# Patient Record
Sex: Female | Born: 1945
Health system: Southern US, Community
[De-identification: ages and names within clinical notes are randomized; demographics above are authoritative.]

## PROBLEM LIST (undated history)

## (undated) DIAGNOSIS — K219 Gastro-esophageal reflux disease without esophagitis: Secondary | ICD-10-CM

## (undated) DIAGNOSIS — Z780 Asymptomatic menopausal state: Secondary | ICD-10-CM

## (undated) DIAGNOSIS — M1990 Unspecified osteoarthritis, unspecified site: Secondary | ICD-10-CM

## (undated) DIAGNOSIS — T8859XA Other complications of anesthesia, initial encounter: Secondary | ICD-10-CM

## (undated) DIAGNOSIS — T4145XA Adverse effect of unspecified anesthetic, initial encounter: Secondary | ICD-10-CM

## (undated) DIAGNOSIS — M545 Low back pain: Secondary | ICD-10-CM

## (undated) DIAGNOSIS — I890 Lymphedema, not elsewhere classified: Secondary | ICD-10-CM

## (undated) DIAGNOSIS — G2581 Restless legs syndrome: Secondary | ICD-10-CM

## (undated) DIAGNOSIS — I10 Essential (primary) hypertension: Secondary | ICD-10-CM

## (undated) HISTORY — PX: TONSILLECTOMY: SUR1361

## (undated) HISTORY — DX: Unspecified osteoarthritis, unspecified site: M19.90

## (undated) HISTORY — DX: Gastro-esophageal reflux disease without esophagitis: K21.9

## (undated) HISTORY — DX: Restless legs syndrome: G25.81

## (undated) HISTORY — PX: CATARACT EXTRACTION: SUR2

## (undated) HISTORY — DX: Asymptomatic menopausal state: Z78.0

## (undated) HISTORY — PX: FOOT SURGERY: SHX648

## (undated) HISTORY — DX: Lymphedema, not elsewhere classified: I89.0

## (undated) HISTORY — DX: Low back pain: M54.5

## (undated) HISTORY — PX: OTHER SURGICAL HISTORY: SHX169

---

## 1994-04-10 DIAGNOSIS — M545 Low back pain, unspecified: Secondary | ICD-10-CM

## 1994-04-10 HISTORY — DX: Low back pain, unspecified: M54.50

## 1997-06-10 DIAGNOSIS — F32A Depression, unspecified: Secondary | ICD-10-CM | POA: Diagnosis present

## 1997-06-10 DIAGNOSIS — G2581 Restless legs syndrome: Secondary | ICD-10-CM

## 1997-06-10 HISTORY — DX: Restless legs syndrome: G25.81

## 1998-06-19 ENCOUNTER — Other Ambulatory Visit: Admission: RE | Admit: 1998-06-19 | Discharge: 1998-06-19 | Payer: Self-pay | Admitting: *Deleted

## 1999-09-10 ENCOUNTER — Other Ambulatory Visit: Admission: RE | Admit: 1999-09-10 | Discharge: 1999-09-10 | Payer: Self-pay | Admitting: *Deleted

## 2000-01-09 DIAGNOSIS — K219 Gastro-esophageal reflux disease without esophagitis: Secondary | ICD-10-CM

## 2000-01-09 HISTORY — DX: Gastro-esophageal reflux disease without esophagitis: K21.9

## 2000-10-15 ENCOUNTER — Other Ambulatory Visit: Admission: RE | Admit: 2000-10-15 | Discharge: 2000-10-15 | Payer: Self-pay | Admitting: *Deleted

## 2001-11-06 ENCOUNTER — Other Ambulatory Visit: Admission: RE | Admit: 2001-11-06 | Discharge: 2001-11-06 | Payer: Self-pay | Admitting: *Deleted

## 2003-02-17 ENCOUNTER — Other Ambulatory Visit: Admission: RE | Admit: 2003-02-17 | Discharge: 2003-02-17 | Payer: Self-pay | Admitting: *Deleted

## 2004-03-21 ENCOUNTER — Other Ambulatory Visit: Payer: Self-pay

## 2004-03-21 ENCOUNTER — Inpatient Hospital Stay: Payer: Self-pay

## 2004-03-26 ENCOUNTER — Ambulatory Visit: Payer: Self-pay

## 2004-04-19 ENCOUNTER — Other Ambulatory Visit: Admission: RE | Admit: 2004-04-19 | Discharge: 2004-04-19 | Payer: Self-pay | Admitting: *Deleted

## 2004-05-21 ENCOUNTER — Ambulatory Visit: Payer: Self-pay | Admitting: Unknown Physician Specialty

## 2004-05-23 ENCOUNTER — Ambulatory Visit: Payer: Self-pay

## 2004-06-13 ENCOUNTER — Encounter: Payer: Self-pay | Admitting: General Practice

## 2004-07-11 ENCOUNTER — Encounter: Payer: Self-pay | Admitting: General Practice

## 2005-01-28 ENCOUNTER — Ambulatory Visit: Payer: Self-pay | Admitting: Internal Medicine

## 2005-02-08 ENCOUNTER — Other Ambulatory Visit: Admission: RE | Admit: 2005-02-08 | Discharge: 2005-02-08 | Payer: Self-pay | Admitting: *Deleted

## 2005-05-16 ENCOUNTER — Ambulatory Visit: Payer: Self-pay | Admitting: Internal Medicine

## 2005-05-22 ENCOUNTER — Ambulatory Visit: Payer: Self-pay | Admitting: Internal Medicine

## 2005-09-02 ENCOUNTER — Ambulatory Visit: Payer: Self-pay | Admitting: Unknown Physician Specialty

## 2006-05-12 ENCOUNTER — Ambulatory Visit: Payer: Self-pay

## 2006-08-01 ENCOUNTER — Other Ambulatory Visit: Admission: RE | Admit: 2006-08-01 | Discharge: 2006-08-01 | Payer: Self-pay | Admitting: Obstetrics & Gynecology

## 2007-03-24 ENCOUNTER — Ambulatory Visit: Payer: Self-pay | Admitting: Internal Medicine

## 2007-05-12 ENCOUNTER — Ambulatory Visit: Payer: Self-pay

## 2007-11-25 ENCOUNTER — Ambulatory Visit: Payer: Self-pay | Admitting: Unknown Physician Specialty

## 2008-05-12 ENCOUNTER — Other Ambulatory Visit: Admission: RE | Admit: 2008-05-12 | Discharge: 2008-05-12 | Payer: Self-pay | Admitting: Obstetrics & Gynecology

## 2008-05-12 ENCOUNTER — Ambulatory Visit: Payer: Self-pay | Admitting: Obstetrics and Gynecology

## 2008-12-05 ENCOUNTER — Other Ambulatory Visit: Payer: Self-pay | Admitting: Obstetrics and Gynecology

## 2009-05-15 ENCOUNTER — Ambulatory Visit: Payer: Self-pay

## 2009-12-07 ENCOUNTER — Ambulatory Visit: Payer: Self-pay | Admitting: Internal Medicine

## 2010-01-10 ENCOUNTER — Ambulatory Visit: Payer: Self-pay | Admitting: Internal Medicine

## 2010-07-05 ENCOUNTER — Other Ambulatory Visit: Payer: Self-pay | Admitting: Internal Medicine

## 2010-07-11 ENCOUNTER — Ambulatory Visit: Payer: Self-pay

## 2010-12-14 ENCOUNTER — Ambulatory Visit: Payer: Self-pay | Admitting: Internal Medicine

## 2011-01-19 ENCOUNTER — Other Ambulatory Visit: Payer: Self-pay | Admitting: Internal Medicine

## 2011-01-29 ENCOUNTER — Ambulatory Visit: Payer: Self-pay | Admitting: Pain Medicine

## 2011-02-18 ENCOUNTER — Ambulatory Visit: Payer: Self-pay | Admitting: Pain Medicine

## 2011-02-21 ENCOUNTER — Ambulatory Visit: Payer: Self-pay | Admitting: Pain Medicine

## 2011-03-07 ENCOUNTER — Ambulatory Visit: Payer: Self-pay | Admitting: Pain Medicine

## 2011-03-25 ENCOUNTER — Ambulatory Visit: Payer: Self-pay | Admitting: Pain Medicine

## 2011-03-28 ENCOUNTER — Ambulatory Visit: Payer: Self-pay | Admitting: Pain Medicine

## 2011-04-22 ENCOUNTER — Ambulatory Visit: Payer: Self-pay | Admitting: General Practice

## 2011-05-08 ENCOUNTER — Inpatient Hospital Stay: Payer: Self-pay | Admitting: General Practice

## 2011-05-08 HISTORY — PX: TOTAL HIP ARTHROPLASTY: SHX124

## 2011-05-09 LAB — PATHOLOGY REPORT

## 2011-08-06 ENCOUNTER — Ambulatory Visit: Payer: Self-pay | Admitting: Obstetrics and Gynecology

## 2011-08-13 ENCOUNTER — Other Ambulatory Visit: Payer: Self-pay | Admitting: Internal Medicine

## 2011-08-13 ENCOUNTER — Other Ambulatory Visit: Payer: Self-pay | Admitting: Dermatology

## 2011-08-13 LAB — HEPATIC FUNCTION PANEL A (ARMC)
Albumin: 4.5 g/dL (ref 3.4–5.0)
Alkaline Phosphatase: 65 U/L (ref 50–136)
Bilirubin, Direct: <0.1 mg/dL (ref 0.00–0.20)
Bilirubin,Total: 0.5 mg/dL (ref 0.2–1.0)
SGOT(AST): 28 U/L (ref 15–37)
SGPT (ALT): 27 U/L
Total Protein: 8.3 g/dL — ABNORMAL HIGH (ref 6.4–8.2)

## 2011-08-13 LAB — CBC WITH DIFFERENTIAL/PLATELET
Basophil #: 0 10*3/uL (ref 0.0–0.1)
Basophil %: 0.3 %
Eosinophil #: 0.2 10*3/uL (ref 0.0–0.7)
Eosinophil %: 2 %
HCT: 42.9 % (ref 35.0–47.0)
HGB: 14.5 g/dL (ref 12.0–16.0)
Lymphocyte #: 2.3 10*3/uL (ref 1.0–3.6)
Lymphocyte %: 29.2 %
MCH: 31.6 pg (ref 26.0–34.0)
MCHC: 33.8 g/dL (ref 32.0–36.0)
MCV: 93 fL (ref 80–100)
Monocyte #: 0.5 10*3/uL (ref 0.0–0.7)
Monocyte %: 5.7 %
Neutrophil #: 5 10*3/uL (ref 1.4–6.5)
Neutrophil %: 62.8 %
Platelet: 272 10*3/uL (ref 150–440)
RBC: 4.6 10*6/uL (ref 3.80–5.20)
RDW: 12.8 % (ref 11.5–14.5)
WBC: 7.9 10*3/uL (ref 3.6–11.0)

## 2011-08-13 LAB — LIPID PANEL
Cholesterol: 255 mg/dL — ABNORMAL HIGH (ref 0–200)
HDL Cholesterol: 39 mg/dL — ABNORMAL LOW (ref 40–60)
Ldl Cholesterol, Calc: 149 mg/dL — ABNORMAL HIGH (ref 0–100)
Triglycerides: 333 mg/dL — ABNORMAL HIGH (ref 0–200)
VLDL Cholesterol, Calc: 67 mg/dL — ABNORMAL HIGH (ref 5–40)

## 2011-08-13 LAB — TSH: Thyroid Stimulating Horm: 0.911 u[IU]/mL

## 2011-10-15 ENCOUNTER — Encounter: Payer: Self-pay | Admitting: General Practice

## 2011-11-05 ENCOUNTER — Other Ambulatory Visit: Payer: Self-pay | Admitting: Internal Medicine

## 2011-11-05 LAB — URINALYSIS, COMPLETE
Bacteria: NONE SEEN
Bilirubin,UR: NEGATIVE
Blood: NEGATIVE
Glucose,UR: NEGATIVE mg/dL (ref 0–75)
Ketone: NEGATIVE
Leukocyte Esterase: NEGATIVE
Nitrite: NEGATIVE
Ph: 6 (ref 4.5–8.0)
Protein: NEGATIVE
RBC,UR: 2 /HPF (ref 0–5)
Specific Gravity: 1.015 (ref 1.003–1.030)
Squamous Epithelial: <1
WBC UR: <1 /HPF (ref 0–5)

## 2011-11-05 LAB — COMPREHENSIVE METABOLIC PANEL
Albumin: 4.2 g/dL (ref 3.4–5.0)
Alkaline Phosphatase: 71 U/L (ref 50–136)
Anion Gap: 9 (ref 7–16)
BUN: 13 mg/dL (ref 7–18)
Bilirubin,Total: 0.5 mg/dL (ref 0.2–1.0)
Calcium, Total: 9.1 mg/dL (ref 8.5–10.1)
Chloride: 105 mmol/L (ref 98–107)
Co2: 27 mmol/L (ref 21–32)
Creatinine: 0.63 mg/dL (ref 0.60–1.30)
EGFR (African American): >60
EGFR (Non-African Amer.): >60
Glucose: 95 mg/dL (ref 65–99)
Osmolality: 281 (ref 275–301)
Potassium: 4 mmol/L (ref 3.5–5.1)
SGOT(AST): 20 U/L (ref 15–37)
SGPT (ALT): 23 U/L
Sodium: 141 mmol/L (ref 136–145)
Total Protein: 7.6 g/dL (ref 6.4–8.2)

## 2011-11-05 LAB — CBC WITH DIFFERENTIAL/PLATELET
Basophil #: 0.1 10*3/uL (ref 0.0–0.1)
Basophil %: 0.9 %
Eosinophil #: 0.1 10*3/uL (ref 0.0–0.7)
Eosinophil %: 2.4 %
HCT: 39.6 % (ref 35.0–47.0)
HGB: 13.5 g/dL (ref 12.0–16.0)
Lymphocyte #: 1.7 10*3/uL (ref 1.0–3.6)
Lymphocyte %: 29 %
MCH: 31.6 pg (ref 26.0–34.0)
MCHC: 34.1 g/dL (ref 32.0–36.0)
MCV: 93 fL (ref 80–100)
Monocyte #: 0.4 x10 3/mm (ref 0.2–0.9)
Monocyte %: 7.2 %
Neutrophil #: 3.5 10*3/uL (ref 1.4–6.5)
Neutrophil %: 60.5 %
Platelet: 213 10*3/uL (ref 150–440)
RBC: 4.28 10*6/uL (ref 3.80–5.20)
RDW: 12.5 % (ref 11.5–14.5)
WBC: 5.8 10*3/uL (ref 3.6–11.0)

## 2011-11-05 LAB — LIPID PANEL
Cholesterol: 241 mg/dL — ABNORMAL HIGH (ref 0–200)
HDL Cholesterol: 43 mg/dL (ref 40–60)
Ldl Cholesterol, Calc: 157 mg/dL — ABNORMAL HIGH (ref 0–100)
Triglycerides: 205 mg/dL — ABNORMAL HIGH (ref 0–200)
VLDL Cholesterol, Calc: 41 mg/dL — ABNORMAL HIGH (ref 5–40)

## 2011-11-11 LAB — HM PAP SMEAR: HM Pap smear: NEGATIVE

## 2011-11-13 ENCOUNTER — Ambulatory Visit: Payer: Self-pay | Admitting: Internal Medicine

## 2011-12-06 ENCOUNTER — Ambulatory Visit: Payer: Self-pay | Admitting: Unknown Physician Specialty

## 2011-12-06 HISTORY — PX: COLONOSCOPY: SHX174

## 2011-12-10 LAB — PATHOLOGY REPORT

## 2012-05-02 ENCOUNTER — Other Ambulatory Visit: Payer: Self-pay | Admitting: Internal Medicine

## 2012-05-02 LAB — BASIC METABOLIC PANEL
Anion Gap: 6 — ABNORMAL LOW (ref 7–16)
BUN: 14 mg/dL (ref 7–18)
Calcium, Total: 9.1 mg/dL (ref 8.5–10.1)
Chloride: 108 mmol/L — ABNORMAL HIGH (ref 98–107)
Co2: 26 mmol/L (ref 21–32)
Creatinine: 0.68 mg/dL (ref 0.60–1.30)
EGFR (African American): >60
EGFR (Non-African Amer.): >60
Glucose: 104 mg/dL — ABNORMAL HIGH (ref 65–99)
Osmolality: 280 (ref 275–301)
Potassium: 3.8 mmol/L (ref 3.5–5.1)
Sodium: 140 mmol/L (ref 136–145)

## 2012-05-02 LAB — LIPID PANEL
Cholesterol: 210 mg/dL — ABNORMAL HIGH (ref 0–200)
HDL Cholesterol: 37 mg/dL — ABNORMAL LOW (ref 40–60)
Ldl Cholesterol, Calc: 119 mg/dL — ABNORMAL HIGH (ref 0–100)
Triglycerides: 269 mg/dL — ABNORMAL HIGH (ref 0–200)
VLDL Cholesterol, Calc: 54 mg/dL — ABNORMAL HIGH (ref 5–40)

## 2012-11-04 ENCOUNTER — Other Ambulatory Visit: Payer: Self-pay | Admitting: Internal Medicine

## 2012-11-04 LAB — LIPID PANEL
Cholesterol: 224 mg/dL — ABNORMAL HIGH (ref 0–200)
HDL Cholesterol: 44 mg/dL (ref 40–60)
Ldl Cholesterol, Calc: 147 mg/dL — ABNORMAL HIGH (ref 0–100)
Triglycerides: 163 mg/dL (ref 0–200)
VLDL Cholesterol, Calc: 33 mg/dL (ref 5–40)

## 2012-11-04 LAB — CBC WITH DIFFERENTIAL/PLATELET
Basophil #: 0 10*3/uL (ref 0.0–0.1)
Basophil %: 0.5 %
Eosinophil #: 0.2 10*3/uL (ref 0.0–0.7)
Eosinophil %: 2.2 %
HCT: 39.5 % (ref 35.0–47.0)
HGB: 14 g/dL (ref 12.0–16.0)
Lymphocyte #: 1.7 10*3/uL (ref 1.0–3.6)
Lymphocyte %: 19.8 %
MCH: 32.2 pg (ref 26.0–34.0)
MCHC: 35.4 g/dL (ref 32.0–36.0)
MCV: 91 fL (ref 80–100)
Monocyte #: 0.4 x10 3/mm (ref 0.2–0.9)
Monocyte %: 4.3 %
Neutrophil #: 6.2 10*3/uL (ref 1.4–6.5)
Neutrophil %: 73.2 %
Platelet: 230 10*3/uL (ref 150–440)
RBC: 4.33 10*6/uL (ref 3.80–5.20)
RDW: 12.8 % (ref 11.5–14.5)
WBC: 8.5 10*3/uL (ref 3.6–11.0)

## 2012-11-04 LAB — URINALYSIS, COMPLETE
Bacteria: NONE SEEN
Bilirubin,UR: NEGATIVE
Blood: NEGATIVE
Glucose,UR: NEGATIVE mg/dL (ref 0–75)
Ketone: NEGATIVE
Leukocyte Esterase: NEGATIVE
Nitrite: NEGATIVE
Ph: 6 (ref 4.5–8.0)
Protein: NEGATIVE
RBC,UR: 2 /HPF (ref 0–5)
Specific Gravity: 1.018 (ref 1.003–1.030)
Squamous Epithelial: <1
WBC UR: 1 /HPF (ref 0–5)

## 2012-11-04 LAB — COMPREHENSIVE METABOLIC PANEL
Albumin: 4.3 g/dL (ref 3.4–5.0)
Alkaline Phosphatase: 86 U/L (ref 50–136)
Anion Gap: 3 — ABNORMAL LOW (ref 7–16)
BUN: 13 mg/dL (ref 7–18)
Bilirubin,Total: 0.6 mg/dL (ref 0.2–1.0)
Calcium, Total: 9.6 mg/dL (ref 8.5–10.1)
Chloride: 107 mmol/L (ref 98–107)
Co2: 29 mmol/L (ref 21–32)
Creatinine: 0.79 mg/dL (ref 0.60–1.30)
EGFR (African American): >60
EGFR (Non-African Amer.): >60
Glucose: 110 mg/dL — ABNORMAL HIGH (ref 65–99)
Osmolality: 278 (ref 275–301)
Potassium: 3.6 mmol/L (ref 3.5–5.1)
SGOT(AST): 22 U/L (ref 15–37)
SGPT (ALT): 29 U/L (ref 12–78)
Sodium: 139 mmol/L (ref 136–145)
Total Protein: 7.9 g/dL (ref 6.4–8.2)

## 2012-11-04 LAB — TSH: Thyroid Stimulating Horm: 1.46 u[IU]/mL

## 2012-11-09 ENCOUNTER — Encounter: Payer: Self-pay | Admitting: *Deleted

## 2012-11-11 ENCOUNTER — Ambulatory Visit: Payer: Self-pay | Admitting: General Practice

## 2012-11-12 ENCOUNTER — Ambulatory Visit (INDEPENDENT_AMBULATORY_CARE_PROVIDER_SITE_OTHER): Payer: 59 | Admitting: Nurse Practitioner

## 2012-11-12 ENCOUNTER — Encounter: Payer: Self-pay | Admitting: Nurse Practitioner

## 2012-11-12 VITALS — BP 116/66 | HR 74

## 2012-11-12 DIAGNOSIS — Z01419 Encounter for gynecological examination (general) (routine) without abnormal findings: Secondary | ICD-10-CM

## 2012-11-12 DIAGNOSIS — E559 Vitamin D deficiency, unspecified: Secondary | ICD-10-CM

## 2012-11-12 MED ORDER — VITAMIN D (ERGOCALCIFEROL) 1.25 MG (50000 UNIT) PO CAPS: 50000.0000 [IU] | ORAL_CAPSULE | ORAL | Status: DC

## 2012-11-12 NOTE — Progress Notes (Signed)
67 y.o. G2P2 Married Caucasian Fe here for annual exam. Newest health concern is with right hip pain and very concerned about needing now a right hip replacement. She is doing OK with vaso symptoms and vaginal dryness. Still has some concerns about hair thinning but all recent lads were normal including TSH.  She has been off  Menostar since fall 2012 and no history of VB since 9/06.    Mother age 40 still lives with them had small bowel resection in January.  She now is back home after recovery at Rehab. Then April 15 th fell and fracture of right elbow and nose. Treated with casting of the elbow  and will have therapy.also.   Patient's last menstrual period was 02/08/2005.          Sexually active: yes  The current method of family planning is none.    Exercising: yes  water aerobics 3x weekly Smoker:  no  Health Maintenance: Pap:  11/11/2011  Normal with negative HR HPV MMG:  11/11/2012 results still pending Colonoscopy:  12/06/2011 normal. Endo negative. BMD:   2012 per patient normal - followed by PCP TDaP:  2006 Labs: PCP does lab (blood) work.    reports that she has never smoked. She has never used smokeless tobacco. She reports that she does not drink alcohol or use illicit drugs.  Past Medical History  Diagnosis Date  . Menopause   . Cortex laceratn, opn intracran wnd, prlng loss conscious no return   . Hiatal hernia   . Low back pain   . Fibromyalgia   . Insomnia     chronic   . Restless leg syndrome   . Fatty tumor   . GERD (gastroesophageal reflux disease)   . Sleep apnea     Past Surgical History  Procedure Laterality Date  . Phlebitis groin    . Cesarean section      X2  . Total hip arthroplasty    . Total hip arthroplasty Left     Current Outpatient Prescriptions  Medication Sig Dispense Refill  . acetaminophen (TYLENOL) 500 MG tablet Take 500 mg by mouth every 6 (six) hours as needed for pain.      . Ascorbic Acid (VITAMIN C) 1000 MG tablet Take 1,000 mg by  mouth daily.      Marland Kitchen aspirin 81 MG tablet Take 81 mg by mouth daily.      Marland Kitchen atenolol (TENORMIN) 50 MG tablet Take 50 mg by mouth daily.      . calcium carbonate (OS-CAL - DOSED IN MG OF ELEMENTAL CALCIUM) 1250 MG tablet Take 1 tablet by mouth daily.      . cholecalciferol (VITAMIN D) 1000 UNITS tablet Take 1,000 Units by mouth daily.      . Cinnamon 500 MG capsule Take 500 mg by mouth daily.      . lansoprazole (PREVACID) 30 MG capsule Take 30 mg by mouth daily.      . Multiple Vitamin (MULTIVITAMIN) tablet Take 1 tablet by mouth daily.      . pramipexole (MIRAPEX) 0.5 MG tablet Take 0.5 mg by mouth 3 (three) times daily.       No current facility-administered medications for this visit.    History reviewed. No pertinent family history.  ROS:  Pertinent items are noted in HPI.  Otherwise, a comprehensive ROS was negative.  Exam:   BP 116/66  Pulse 74  LMP 02/08/2005    Ht Readings from Last 3 Encounters:  No  data found for Ht    General appearance: alert, cooperative and appears stated age Head: Normocephalic, without obvious abnormality, atraumatic Neck: no adenopathy, supple, symmetrical, trachea midline and thyroid normal to inspection and palpation Lungs: clear to auscultation bilaterally Breasts: normal appearance, no masses or tenderness Heart: regular rate and rhythm Abdomen: soft, non-tender; no masses,  no organomegaly Extremities: extremities normal, atraumatic, no cyanosis or edema Skin: Skin color, texture, turgor normal. No rashes or lesions Lymph nodes: Cervical, supraclavicular, and axillary nodes normal. No abnormal inguinal nodes palpated Neurologic: Grossly normal   Pelvic: External genitalia:  no lesions              Urethra:  normal appearing urethra with no masses, tenderness or lesions              Bartholin's and Skene's: normal                 Vagina: normal appearing vagina with normal color and discharge, no lesions              Cervix: anteverted               Pap taken: no Bimanual Exam:  Uterus:  normal size, contour, position, consistency, mobility, non-tender              Adnexa: no mass, fullness, tenderness               Rectovaginal: Confirms               Anus:  normal sphincter tone, no lesions  A:  Well Woman with normal exam  Postmenopausal off HRT fall 2012  Osteoarthritis with left hip replacement 04/2011, now pain on right.  Vit D deficiency  P:   Pap smear as per guidelines   Refill Vit D and follow lab results  Mammogram due again next June providing this one is normal return annually or prn Labs scanned from PCP dated 11/04/12  An After Visit Summary was printed and given to the patient.

## 2012-11-12 NOTE — Progress Notes (Signed)
Reviewed personally.  MSM 

## 2012-11-12 NOTE — Patient Instructions (Signed)

## 2012-11-13 ENCOUNTER — Telehealth: Payer: Self-pay | Admitting: *Deleted

## 2012-11-13 LAB — VITAMIN D 25 HYDROXY (VIT D DEFICIENCY, FRACTURES): Vit D, 25-Hydroxy: 70 ng/mL (ref 30–89)

## 2012-11-13 NOTE — Telephone Encounter (Signed)
LVM for pt to return my call in regards to lab results.  

## 2012-11-13 NOTE — Telephone Encounter (Signed)
Message copied by Osie Bond on Fri Nov 13, 2012  9:39 AM ------      Message from: Ria Comment R      Created: Fri Nov 13, 2012  9:31 AM       Let patient know about vit D and discontinue at this time ------

## 2012-11-16 ENCOUNTER — Telehealth: Payer: Self-pay | Admitting: *Deleted

## 2012-11-16 NOTE — Telephone Encounter (Signed)
Message copied by Osie Bond on Mon Nov 16, 2012  9:55 AM ------      Message from: Roanna Banning      Created: Fri Nov 13, 2012  9:31 AM       Let patient know about vit D and discontinue at this time ------

## 2012-11-16 NOTE — Telephone Encounter (Signed)
Pt is aware of vitamin d results and is agreeable to discontinue vitamin d (otc) intake.

## 2012-11-24 ENCOUNTER — Ambulatory Visit: Payer: Self-pay | Admitting: Internal Medicine

## 2013-11-15 ENCOUNTER — Ambulatory Visit: Payer: 59 | Admitting: Nurse Practitioner

## 2013-11-18 ENCOUNTER — Ambulatory Visit: Payer: Self-pay | Admitting: Obstetrics and Gynecology

## 2013-11-19 ENCOUNTER — Ambulatory Visit: Payer: Self-pay | Admitting: Obstetrics and Gynecology

## 2013-12-21 ENCOUNTER — Ambulatory Visit: Payer: 59 | Admitting: Nurse Practitioner

## 2013-12-28 DIAGNOSIS — I10 Essential (primary) hypertension: Secondary | ICD-10-CM | POA: Insufficient documentation

## 2013-12-28 DIAGNOSIS — G2581 Restless legs syndrome: Secondary | ICD-10-CM | POA: Insufficient documentation

## 2013-12-28 DIAGNOSIS — K219 Gastro-esophageal reflux disease without esophagitis: Secondary | ICD-10-CM | POA: Insufficient documentation

## 2013-12-28 DIAGNOSIS — E785 Hyperlipidemia, unspecified: Secondary | ICD-10-CM | POA: Insufficient documentation

## 2014-04-11 ENCOUNTER — Encounter: Payer: Self-pay | Admitting: Nurse Practitioner

## 2014-05-17 ENCOUNTER — Ambulatory Visit: Payer: Self-pay | Admitting: Nurse Practitioner

## 2014-09-15 ENCOUNTER — Ambulatory Visit: Payer: 59 | Admitting: Nurse Practitioner

## 2014-11-02 ENCOUNTER — Other Ambulatory Visit: Payer: Self-pay

## 2014-11-03 ENCOUNTER — Encounter: Payer: Self-pay | Admitting: *Deleted

## 2014-11-03 DIAGNOSIS — Z79899 Other long term (current) drug therapy: Secondary | ICD-10-CM | POA: Diagnosis not present

## 2014-11-03 DIAGNOSIS — M23241 Derangement of anterior horn of lateral meniscus due to old tear or injury, right knee: Secondary | ICD-10-CM | POA: Diagnosis not present

## 2014-11-03 DIAGNOSIS — M94261 Chondromalacia, right knee: Secondary | ICD-10-CM | POA: Diagnosis not present

## 2014-11-03 DIAGNOSIS — Z888 Allergy status to other drugs, medicaments and biological substances status: Secondary | ICD-10-CM | POA: Diagnosis not present

## 2014-11-03 DIAGNOSIS — Z880 Allergy status to penicillin: Secondary | ICD-10-CM | POA: Diagnosis not present

## 2014-11-03 DIAGNOSIS — G4730 Sleep apnea, unspecified: Secondary | ICD-10-CM | POA: Diagnosis not present

## 2014-11-03 DIAGNOSIS — M23221 Derangement of posterior horn of medial meniscus due to old tear or injury, right knee: Secondary | ICD-10-CM | POA: Diagnosis not present

## 2014-11-03 DIAGNOSIS — I10 Essential (primary) hypertension: Secondary | ICD-10-CM | POA: Diagnosis not present

## 2014-11-03 DIAGNOSIS — M2391 Unspecified internal derangement of right knee: Secondary | ICD-10-CM | POA: Diagnosis present

## 2014-11-03 DIAGNOSIS — K219 Gastro-esophageal reflux disease without esophagitis: Secondary | ICD-10-CM | POA: Diagnosis not present

## 2014-11-03 DIAGNOSIS — Z79891 Long term (current) use of opiate analgesic: Secondary | ICD-10-CM | POA: Diagnosis not present

## 2014-11-03 DIAGNOSIS — E785 Hyperlipidemia, unspecified: Secondary | ICD-10-CM | POA: Diagnosis not present

## 2014-11-03 DIAGNOSIS — Z9889 Other specified postprocedural states: Secondary | ICD-10-CM | POA: Diagnosis not present

## 2014-11-03 DIAGNOSIS — Z881 Allergy status to other antibiotic agents status: Secondary | ICD-10-CM | POA: Diagnosis not present

## 2014-11-03 DIAGNOSIS — M23251 Derangement of posterior horn of lateral meniscus due to old tear or injury, right knee: Secondary | ICD-10-CM | POA: Diagnosis not present

## 2014-11-03 NOTE — Patient Instructions (Signed)
  Your procedure is scheduled on:11/14/14 Report to Day Surgery. To find out your arrival time please call [phone] between 1PM - 3PM on 11/18/14.  Remember: Instructions that are not followed completely may result in serious medical risk, up to and including death, or upon the discretion of your surgeon and anesthesiologist your surgery may need to be rescheduled.    _x___ 1. Do not eat food or drink liquids after midnight. No gum chewing or hard candies.     __x__ 2. No Alcohol for 24 hours before or after surgery.   ____ 3. Bring all medications with you on the day of surgery if instructed.    __x__ 4. Notify your doctor if there is any change in your medical condition     (cold, fever, infections).     Do not wear jewelry, make-up, hairpins, clips or nail polish.  Do not wear lotions, powders, or perfumes. You may wear deodorant.  Do not shave 48 hours prior to surgery. Men may shave face and neck.  Do not bring valuables to the hospital.    [facility] is not responsible for any belongings or valuables.               Contacts, dentures or bridgework may not be worn into surgery.  Leave your suitcase in the car. After surgery it may be brought to your room.  For patients admitted to the hospital, discharge time is determined by your                treatment team.   Patients discharged the day of surgery will not be allowed to drive home.   Please read over the following fact sheets that you were given:   Surgical Site Infection Prevention   ____ Take these medicines the morning of surgery with A SIP OF WATER:    1. atenolol  2. prilosec  3.   4.  5.  6.  ____ Fleet Enema (as directed)   ___x_ Use CHG Soap as directed  ____ Use inhalers on the day of surgery  ____ Stop metformin 2 days prior to surgery    ____ Take 1/2 of usual insulin dose the night before surgery and none on the morning of surgery.   ____ Stop Coumadin/Plavix/aspirin on ____ Stop  Anti-inflammatories on    _x___ Stop supplements until after surgery.   red yeast rice,hair skin nails  ____ Bring C-Pap to the hospital.

## 2014-11-03 NOTE — OR Nursing (Signed)
Patient called with medication completion. Instructed to stop melatonin now

## 2014-11-08 ENCOUNTER — Ambulatory Visit: Payer: 59 | Admitting: Nurse Practitioner

## 2014-11-09 ENCOUNTER — Encounter
Admission: RE | Admit: 2014-11-09 | Discharge: 2014-11-09 | Disposition: A | Discharge status: Home / Self Care | Payer: Medicare Other | Source: Ambulatory Visit | Attending: Anesthesiology | Admitting: Anesthesiology

## 2014-11-09 DIAGNOSIS — E785 Hyperlipidemia, unspecified: Secondary | ICD-10-CM | POA: Diagnosis not present

## 2014-11-09 DIAGNOSIS — Z01810 Encounter for preprocedural cardiovascular examination: Secondary | ICD-10-CM | POA: Insufficient documentation

## 2014-11-09 DIAGNOSIS — Z79899 Other long term (current) drug therapy: Secondary | ICD-10-CM | POA: Diagnosis not present

## 2014-11-09 DIAGNOSIS — I10 Essential (primary) hypertension: Secondary | ICD-10-CM | POA: Insufficient documentation

## 2014-11-09 DIAGNOSIS — M2391 Unspecified internal derangement of right knee: Secondary | ICD-10-CM | POA: Insufficient documentation

## 2014-11-09 DIAGNOSIS — K219 Gastro-esophageal reflux disease without esophagitis: Secondary | ICD-10-CM | POA: Diagnosis not present

## 2014-11-09 DIAGNOSIS — N281 Cyst of kidney, acquired: Secondary | ICD-10-CM | POA: Diagnosis not present

## 2014-11-09 DIAGNOSIS — G4730 Sleep apnea, unspecified: Secondary | ICD-10-CM | POA: Insufficient documentation

## 2014-11-10 NOTE — OR Nursing (Signed)
Ekg to anesthesia for review

## 2014-11-14 ENCOUNTER — Ambulatory Visit: Payer: Medicare Other | Admitting: Anesthesiology

## 2014-11-14 ENCOUNTER — Encounter: Payer: Self-pay | Admitting: *Deleted

## 2014-11-14 ENCOUNTER — Ambulatory Visit
Admission: RE | Admit: 2014-11-14 | Discharge: 2014-11-14 | Disposition: A | Discharge status: Home / Self Care | Payer: Medicare Other | Source: Ambulatory Visit | Attending: Orthopedic Surgery | Admitting: Orthopedic Surgery

## 2014-11-14 ENCOUNTER — Encounter
Admission: RE | Disposition: A | Discharge status: Home / Self Care | Payer: Self-pay | Source: Ambulatory Visit | Attending: Orthopedic Surgery

## 2014-11-14 DIAGNOSIS — Z79899 Other long term (current) drug therapy: Secondary | ICD-10-CM | POA: Insufficient documentation

## 2014-11-14 DIAGNOSIS — M23251 Derangement of posterior horn of lateral meniscus due to old tear or injury, right knee: Secondary | ICD-10-CM | POA: Insufficient documentation

## 2014-11-14 DIAGNOSIS — M23241 Derangement of anterior horn of lateral meniscus due to old tear or injury, right knee: Secondary | ICD-10-CM | POA: Insufficient documentation

## 2014-11-14 DIAGNOSIS — M94261 Chondromalacia, right knee: Secondary | ICD-10-CM | POA: Insufficient documentation

## 2014-11-14 DIAGNOSIS — M23221 Derangement of posterior horn of medial meniscus due to old tear or injury, right knee: Secondary | ICD-10-CM | POA: Diagnosis not present

## 2014-11-14 DIAGNOSIS — Z9889 Other specified postprocedural states: Secondary | ICD-10-CM | POA: Insufficient documentation

## 2014-11-14 DIAGNOSIS — I10 Essential (primary) hypertension: Secondary | ICD-10-CM | POA: Insufficient documentation

## 2014-11-14 DIAGNOSIS — G4730 Sleep apnea, unspecified: Secondary | ICD-10-CM | POA: Insufficient documentation

## 2014-11-14 DIAGNOSIS — K219 Gastro-esophageal reflux disease without esophagitis: Secondary | ICD-10-CM | POA: Insufficient documentation

## 2014-11-14 DIAGNOSIS — Z881 Allergy status to other antibiotic agents status: Secondary | ICD-10-CM | POA: Insufficient documentation

## 2014-11-14 DIAGNOSIS — Z880 Allergy status to penicillin: Secondary | ICD-10-CM | POA: Insufficient documentation

## 2014-11-14 DIAGNOSIS — E785 Hyperlipidemia, unspecified: Secondary | ICD-10-CM | POA: Insufficient documentation

## 2014-11-14 DIAGNOSIS — Z888 Allergy status to other drugs, medicaments and biological substances status: Secondary | ICD-10-CM | POA: Insufficient documentation

## 2014-11-14 DIAGNOSIS — Z79891 Long term (current) use of opiate analgesic: Secondary | ICD-10-CM | POA: Insufficient documentation

## 2014-11-14 HISTORY — PX: KNEE ARTHROSCOPY: SHX127

## 2014-11-14 SURGERY — ARTHROSCOPY, KNEE
Anesthesia: General | Laterality: Right | Wound class: Clean

## 2014-11-14 MED ORDER — FENTANYL CITRATE (PF) 100 MCG/2ML IJ SOLN
INTRAMUSCULAR | Status: DC | PRN
  Administered 2014-11-14 (×2): 50 ug via INTRAVENOUS

## 2014-11-14 MED ORDER — FENTANYL CITRATE (PF) 100 MCG/2ML IJ SOLN
25.0000 ug | INTRAMUSCULAR | Status: AC | PRN
  Administered 2014-11-14 (×6): 25 ug via INTRAVENOUS

## 2014-11-14 MED ORDER — HYDROCODONE-ACETAMINOPHEN 5-325 MG PO TABS: 1.0000 | ORAL_TABLET | ORAL | Status: DC | PRN

## 2014-11-14 MED ORDER — LIDOCAINE HCL (CARDIAC) 20 MG/ML IV SOLN
INTRAVENOUS | Status: DC | PRN
  Administered 2014-11-14: 100 mg via INTRAVENOUS

## 2014-11-14 MED ORDER — MIDAZOLAM HCL 2 MG/2ML IJ SOLN
INTRAMUSCULAR | Status: DC | PRN
  Administered 2014-11-14: 2 mg via INTRAVENOUS

## 2014-11-14 MED ORDER — ONDANSETRON HCL 4 MG/2ML IJ SOLN
INTRAMUSCULAR | Status: DC | PRN
  Administered 2014-11-14: 4 mg via INTRAVENOUS

## 2014-11-14 MED ORDER — DEXAMETHASONE SODIUM PHOSPHATE 4 MG/ML IJ SOLN
INTRAMUSCULAR | Status: DC | PRN
Start: 2014-11-14 — End: 2014-11-14
  Administered 2014-11-14: 5 mg via INTRAVENOUS

## 2014-11-14 MED ORDER — EPHEDRINE SULFATE 50 MG/ML IJ SOLN
INTRAMUSCULAR | Status: DC | PRN
  Administered 2014-11-14: 5 mg via INTRAVENOUS

## 2014-11-14 MED ORDER — LACTATED RINGERS IR SOLN
Status: DC | PRN
  Administered 2014-11-14: 18375 mL

## 2014-11-14 MED ORDER — ONDANSETRON HCL 4 MG/2ML IJ SOLN: 4.0000 mg | Freq: Four times a day (QID) | INTRAMUSCULAR | Status: DC | PRN

## 2014-11-14 MED ORDER — FENTANYL CITRATE (PF) 100 MCG/2ML IJ SOLN
INTRAMUSCULAR | Status: AC
  Filled 2014-11-14: qty 2

## 2014-11-14 MED ORDER — ONDANSETRON HCL 4 MG PO TABS: 4.0000 mg | ORAL_TABLET | Freq: Four times a day (QID) | ORAL | Status: DC | PRN

## 2014-11-14 MED ORDER — MORPHINE SULFATE 4 MG/ML IJ SOLN
INTRAMUSCULAR | Status: AC
  Filled 2014-11-14: qty 1

## 2014-11-14 MED ORDER — PHENYLEPHRINE HCL 10 MG/ML IJ SOLN
INTRAMUSCULAR | Status: DC | PRN
  Administered 2014-11-14 (×3): 100 ug via INTRAVENOUS

## 2014-11-14 MED ORDER — LACTATED RINGERS IV SOLN
INTRAVENOUS | Status: DC
  Administered 2014-11-14: 14:00:00 via INTRAVENOUS

## 2014-11-14 MED ORDER — CHLORHEXIDINE GLUCONATE 4 % EX LIQD
60.0000 mL | Freq: Once | CUTANEOUS | Status: AC
  Administered 2014-11-14: 4 via TOPICAL

## 2014-11-14 MED ORDER — BUPIVACAINE HCL 0.25 % IJ SOLN
INTRAMUSCULAR | Status: DC | PRN
  Administered 2014-11-14: 5 mL

## 2014-11-14 MED ORDER — METOCLOPRAMIDE HCL 5 MG/ML IJ SOLN
5.0000 mg | Freq: Three times a day (TID) | INTRAMUSCULAR | Status: DC | PRN
Start: 2014-11-14 — End: 2014-11-15

## 2014-11-14 MED ORDER — ACETAMINOPHEN 10 MG/ML IV SOLN
INTRAVENOUS | Status: AC
  Filled 2014-11-14: qty 100

## 2014-11-14 MED ORDER — BUPIVACAINE-EPINEPHRINE (PF) 0.25% -1:200000 IJ SOLN
INTRAMUSCULAR | Status: AC
  Filled 2014-11-14: qty 30

## 2014-11-14 MED ORDER — PROPOFOL 10 MG/ML IV BOLUS
INTRAVENOUS | Status: DC | PRN
  Administered 2014-11-14: 150 mg via INTRAVENOUS
  Administered 2014-11-14: 50 mg via INTRAVENOUS

## 2014-11-14 MED ORDER — CLINDAMYCIN PHOSPHATE 600 MG/50ML IV SOLN
INTRAVENOUS | Status: AC
  Filled 2014-11-14: qty 50

## 2014-11-14 MED ORDER — HYDROCODONE-ACETAMINOPHEN 5-325 MG PO TABS
ORAL_TABLET | ORAL | Status: AC
  Administered 2014-11-14: 2
  Filled 2014-11-14: qty 2

## 2014-11-14 MED ORDER — ONDANSETRON HCL 4 MG/2ML IJ SOLN: 4.0000 mg | Freq: Once | INTRAMUSCULAR | Status: DC | PRN

## 2014-11-14 MED ORDER — ACETAMINOPHEN 10 MG/ML IV SOLN
INTRAVENOUS | Status: DC | PRN
  Administered 2014-11-14: 1000 mg via INTRAVENOUS

## 2014-11-14 MED ORDER — CLINDAMYCIN PHOSPHATE 900 MG/50ML IV SOLN
INTRAVENOUS | Status: DC | PRN
  Administered 2014-11-14: 900 mg via INTRAVENOUS

## 2014-11-14 MED ORDER — MORPHINE SULFATE 4 MG/ML IJ SOLN
INTRAMUSCULAR | Status: DC | PRN
  Administered 2014-11-14: 26 mL via INTRA_ARTICULAR

## 2014-11-14 MED ORDER — METOCLOPRAMIDE HCL 10 MG PO TABS: 5.0000 mg | ORAL_TABLET | Freq: Three times a day (TID) | ORAL | Status: DC | PRN

## 2014-11-14 SURGICAL SUPPLY — 25 items
ADAPTER IRRIG TUBE 2 SPIKE SOL (ADAPTER) ×4 IMPLANT
ADPR TBG 2 SPK PMP STRL ASCP (ADAPTER) ×2
BLADE SHAVER 4.5 DBL SERAT CV (CUTTER) ×3 IMPLANT
BNDG ESMARK 6X12 TAN STRL LF (GAUZE/BANDAGES/DRESSINGS) ×1 IMPLANT
DRSG DERMACEA 8X12 NADH (GAUZE/BANDAGES/DRESSINGS) ×3 IMPLANT
DURAPREP 26ML APPLICATOR (WOUND CARE) ×4 IMPLANT
GAUZE SPONGE 4X4 12PLY STRL (GAUZE/BANDAGES/DRESSINGS) ×3 IMPLANT
GLOVE BIOGEL M STRL SZ7.5 (GLOVE) ×7 IMPLANT
GLOVE INDICATOR 8.0 STRL GRN (GLOVE) ×7 IMPLANT
GOWN STRL REUS W/ TWL LRG LVL3 (GOWN DISPOSABLE) ×1 IMPLANT
GOWN STRL REUS W/TWL LRG LVL3 (GOWN DISPOSABLE) ×3
GOWN STRL REUS W/TWL XL LVL4 (GOWN DISPOSABLE) ×3 IMPLANT
GOWN SURG REUSE MED LVL3 (GOWN DISPOSABLE) ×2 IMPLANT
IV LACTATED RINGER IRRG 3000ML (IV SOLUTION) ×21
IV LR IRRIG 3000ML ARTHROMATIC (IV SOLUTION) ×6 IMPLANT
MANIFOLD NEPTUNE II (INSTRUMENTS) ×3 IMPLANT
PACK ARTHROSCOPY KNEE (MISCELLANEOUS) ×3 IMPLANT
SET TUBE SUCT SHAVER OUTFL 24K (TUBING) ×3 IMPLANT
SET TUBE TIP INTRA-ARTICULAR (MISCELLANEOUS) ×3 IMPLANT
STRAP SAFETY BODY (MISCELLANEOUS) ×3 IMPLANT
SUT ETHILON 3-0 FS-10 30 BLK (SUTURE) ×3
SUTURE EHLN 3-0 FS-10 30 BLK (SUTURE) ×1 IMPLANT
TUBING ARTHRO INFLOW-ONLY STRL (TUBING) ×3 IMPLANT
WAND HAND CNTRL MULTIVAC 50 (MISCELLANEOUS) ×3 IMPLANT
WRAP KNEE W/COLD PACKS 25.5X14 (SOFTGOODS) ×3 IMPLANT

## 2014-11-14 NOTE — Op Note (Signed)
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   ASSISTANT: none  ANESTHESIA: general  ESTIMATED BLOOD LOSS: Minimal  FLUIDS REPLACED: 1000 mL of crystalloid  TOURNIQUET TIME: Not used   DRAINS: none  IMPLANTS UTILIZED: None  INDICATIONS FOR SURGERY: Claudia Terry is a 69 y.o. year old female who has been seen for complaints of right knee pain. MRI demonstrated findings consistent with meniscal pathology. After discussion of the risks and benefits of surgical intervention, the patient expressed understanding of the risks benefits and agree with plans for right knee arthroscopy.   PROCEDURE IN DETAIL: The patient was brought into the operating room and, after adequate general anesthesia was achieved, a tourniquet was applied to the right thigh and the leg was placed in the leg holder. All bony prominences were well padded. The patient's right knee was cleaned and prepped with alcohol and Duraprep and draped in the usual sterile fashion. A "timeout" was performed as per usual protocol. The anticipated portal sites were injected with 0.25% Marcaine with epinephrine. An anterolateral incision was made and a cannula was inserted. A moderate effusion was evacuated and the knee was distended with fluid using the pump. The scope was advanced down the medial gutter into the medial compartment. Under visualization with the scope, an anteromedial portal was created and a hooked probe was inserted. The medial  meniscus was visualized and probed.  There was a radial tear along the posterior horn of the medial meniscus. This was debrided and contoured using meniscal punches and a 4.5 mm incisor shaver. Final contouring was performed using the 50 ArthroCare wand. The articular cartilage was visualized and was noted to be in good condition.  The scope was then advanced into the intercondylar notch. The anterior cruciate ligament was visualized and probed and felt to be intact. The scope was removed from the lateral portal and reinserted via the anteromedial portal to better visualize the lateral compartment. The lateral meniscus was visualized and probed. A large flap type lesion was noted anteriorly. Both the anterior and posterior horns demonstrated significant degenerative tears with a large horizontal cleavage tear noted as well. The tears were debrided using a combination of meniscal punches and a 4.5 mm shaver. Final contouring was performed using the 50 ArthroCare wand. The articular cartilage of the lateral compartment was visualized. There were grade 2-3 changes of chondromalacia involving the lateral compartment. These areas were debrided and contoured using the ArthroCare wand. Finally, the scope was advanced so as to visualize the patellofemoral articulation. Good patellar tracking was appreciated. The patellofemoral articulation was in good condition.  The knee was irrigated with copius amounts of fluid and suctioned dry. The anterolateral portal was re-approximated with #3-0 nylon. A combination of 0.25% Marcaine with epinephrine and 4 mg of Morphine were injected via the scope. The scope was removed and the anteromedial portal was re-approximated with #3-0 nylon. A sterile dressing was applied followed by application of an ice wrap.  The patient tolerated the procedure well and  was transported to the PACU in stable condition.  James P. Holley Bouche., M.D.

## 2014-11-14 NOTE — Discharge Instructions (Signed)
AMBULATORY SURGERY  DISCHARGE INSTRUCTIONS   1) The drugs that you were given will stay in your system until tomorrow so for the next 24 hours you should not:  A) Drive an automobile B) Make any legal decisions C) Drink any alcoholic beverage   2) You may resume regular meals tomorrow.  Today it is better to start with liquids and gradually work up to solid foods.  You may eat anything you prefer, but it is better to start with liquids, then soup and crackers, and gradually work up to solid foods.   3) Please notify your doctor immediately if you have any unusual bleeding, trouble breathing, redness and pain at the surgery site, drainage, fever, or pain not relieved by medication. 4)   5) Your post-operative visit with Dr.    George Ina                                 is: Date:                        Time:    Please call to schedule your post-operative visit.  6) Additional Instructions: 7)

## 2014-11-14 NOTE — Brief Op Note (Signed)
11/14/2014  7:07 PM  PATIENT:  Claudia Terry  69 y.o. female  PRE-OPERATIVE DIAGNOSIS:  INTERNAL DERANGEMENT RIGHT KNEE  POST-OPERATIVE DIAGNOSIS:  Radial tear posterior horn medial meniscus, right knee Tear of anterior and posrerior horns of lateral meniscus, right knee Grade III chondromalacia of lateral compartment, right knee  PROCEDURE: Right knee arthroscopy, partial medial and lateral meniscectomies, lateral chondroplasty  SURGEON:  Surgeon(s) and Role:    * Dereck Leep, MD - Primary  ASSISTANTS: none   ANESTHESIA:   general  EBL: minimal  BLOOD ADMINISTERED:none  DRAINS: none   LOCAL MEDICATIONS USED:  MARCAINE     SPECIMEN:  No Specimen  DISPOSITION OF SPECIMEN:  N/A  COUNTS:  YES  TOURNIQUET:  not used  DICTATION: .Sales executive  PLAN OF CARE: Discharge to home after PACU  PATIENT DISPOSITION:  PACU - hemodynamically stable.   Delay start of Pharmacological VTE agent (>24hrs) due to surgical blood loss or risk of bleeding: not applicable

## 2014-11-14 NOTE — Anesthesia Postprocedure Evaluation (Signed)
  Anesthesia Post-op Note  Patient: Claudia Terry  Procedure(s) Performed: Procedure(s) with comments: ARTHROSCOPY KNEE (Right) - Posterior horn menicus, anterior lateral menicus grade 3 chondromalacia  Anesthesia type:General  Patient location: PACU  Post pain: Pain level controlled  Post assessment: Post-op Vital signs reviewed, Patient's Cardiovascular Status Stable, Respiratory Function Stable, Patent Airway and No signs of Nausea or vomiting  Post vital signs: Reviewed and stable  Last Vitals:  Filed Vitals:   11/14/14 1900  BP: 126/76  Pulse: 77  Temp: 36.6 C  Resp: 16    Level of consciousness: awake, alert  and patient cooperative  Complications: No apparent anesthesia complications

## 2014-11-14 NOTE — Anesthesia Preprocedure Evaluation (Signed)
Anesthesia Evaluation  Patient identified by MRN, date of birth, ID band Patient awake    Reviewed: Allergy & Precautions, NPO status , Patient's Chart, lab work & pertinent test results  Airway Mallampati: II  TM Distance: >3 FB Neck ROM: Full    Dental   Pulmonary sleep apnea ,          Cardiovascular Pt. on home beta blockers + dysrhythmias (Tachycardia)     Neuro/Psych    GI/Hepatic GERD-  Medicated,  Endo/Other    Renal/GU      Musculoskeletal   Abdominal   Peds  Hematology   Anesthesia Other Findings   Reproductive/Obstetrics                             Anesthesia Physical Anesthesia Plan  ASA: III  Anesthesia Plan: General   Post-op Pain Management:    Induction: Intravenous  Airway Management Planned: LMA  Additional Equipment:   Intra-op Plan:   Post-operative Plan:   Informed Consent: I have reviewed the patients History and Physical, chart, labs and discussed the procedure including the risks, benefits and alternatives for the proposed anesthesia with the patient or authorized representative who has indicated his/her understanding and acceptance.     Plan Discussed with:   Anesthesia Plan Comments:         Anesthesia Quick Evaluation

## 2014-11-14 NOTE — Transfer of Care (Signed)
Immediate Anesthesia Transfer of Care Note  Patient: Claudia Terry  Procedure(s) Performed: Procedure(s): ARTHROSCOPY KNEE (Right)  Patient Location: PACU  Anesthesia Type:General  Level of Consciousness: awake  Airway & Oxygen Therapy: Patient Spontanous Breathing and Patient connected to face mask oxygen  Post-op Assessment: Report given to RN and Post -op Vital signs reviewed and stable  Post vital signs: stable  Last Vitals:  Filed Vitals:   11/14/14 1900  BP: 126/76  Pulse: 77  Temp: 36.6 C  Resp: 16    Complications: No apparent anesthesia complications

## 2014-11-14 NOTE — H&P (Signed)
The patient has been re-examined, and the chart reviewed, and there have been no interval changes to the documented history and physical.    The risks, benefits, and alternatives have been discussed at length, and the patient is willing to proceed.   

## 2014-11-14 NOTE — Anesthesia Procedure Notes (Signed)
Procedure Name: LMA Insertion Date/Time: 11/14/2014 5:07 PM Performed by: Aline Brochure Pre-anesthesia Checklist: Patient identified, Emergency Drugs available, Suction available and Patient being monitored Patient Re-evaluated:Patient Re-evaluated prior to inductionOxygen Delivery Method: Circle system utilized Preoxygenation: Pre-oxygenation with 100% oxygen Intubation Type: IV induction Ventilation: Mask ventilation without difficulty LMA: LMA inserted LMA Size: 3.5 Number of attempts: 1 Airway Equipment and Method: Patient positioned with wedge pillow Placement Confirmation: positive ETCO2 and breath sounds checked- equal and bilateral Tube secured with: Tape Dental Injury: Teeth and Oropharynx as per pre-operative assessment

## 2014-11-15 ENCOUNTER — Encounter: Payer: Self-pay | Admitting: Orthopedic Surgery

## 2014-12-05 ENCOUNTER — Other Ambulatory Visit: Payer: Self-pay

## 2015-01-04 ENCOUNTER — Ambulatory Visit: Payer: 59 | Admitting: Nurse Practitioner

## 2015-02-08 ENCOUNTER — Encounter: Payer: Self-pay | Admitting: Nurse Practitioner

## 2015-02-08 ENCOUNTER — Ambulatory Visit (INDEPENDENT_AMBULATORY_CARE_PROVIDER_SITE_OTHER): Payer: Medicare Other | Admitting: Nurse Practitioner

## 2015-02-08 VITALS — BP 108/68 | HR 76 | Temp 98.7°F | Resp 16

## 2015-02-08 DIAGNOSIS — E559 Vitamin D deficiency, unspecified: Secondary | ICD-10-CM | POA: Diagnosis not present

## 2015-02-08 DIAGNOSIS — Z01419 Encounter for gynecological examination (general) (routine) without abnormal findings: Secondary | ICD-10-CM | POA: Diagnosis not present

## 2015-02-08 DIAGNOSIS — Z0000 Encounter for general adult medical examination without abnormal findings: Secondary | ICD-10-CM

## 2015-02-08 NOTE — Progress Notes (Signed)
Called and scheduled patient for mammogram 02/21/15 at 12:15 at [facility].

## 2015-02-08 NOTE — Progress Notes (Signed)
Encounter reviewed by Dr. Marketa Midkiff Amundson C. Silva.  

## 2015-02-08 NOTE — Progress Notes (Signed)
Patient ID: Claudia Terry, female   DOB: 08/18/1945, 69 y.o.   MRN: [id number] 69 y.o. G17P2002 Married  Caucasian Fe here for annual exam.  This patient has been having multiple orthopedic issues.  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  She had tendonitis and was on bed rest for several months. [place] then [place] the [place] in the [place] and on an incline turned her right knee,  That resulted in a knee scope.  She again was on bedrest.  She is now doing water aerobics and PT which has really helped to strengthen the knee and leg.  She had gained about 15 lb. And now down 5 lb.    Husband also this year diagnosed with prostate cancer and treated with cryotherapy.  Seems to be doing well now.  Mother now age 89 is still living with them and doing well.   Patient's last menstrual period was 02/08/2005 (approximate).          Sexually active: Yes.    The current method of family planning is post menopausal status.    Exercising: Yes.    Physical therapy twice weekly, water aerobics three times weekly Smoker:  no  Health Maintenance: Pap: 11/11/2011 Negative with negative HR HPV MMG: 11/19/13, Bi-Rads 1:  Negative  Colonoscopy: 12/06/2011 normal. Endo negative. BMD: 2012 per patient normal - followed by PCP TDaP: 2006 Labs: PCP does lab (blood) work.    reports that she has never smoked. She has never used smokeless tobacco. She reports that she does not drink alcohol or use illicit drugs.  Past Medical History  Diagnosis Date  . Menopause   . Low back pain 11/95  . Restless leg syndrome 1999  . Fatty tumor 10/02    R kidney Dr. Serita Butcher  . GERD (gastroesophageal reflux disease) 8/01     Dr. Tiffany Kocher  . Sleep apnea 12/11  . Dysrhythmia     Past Surgical History  Procedure Laterality Date  . Phlebitis groin    . Total hip arthroplasty Left 05/08/11    necrosis of hip  . Cesarean section  1972, 1975    X2  . Colonoscopy  12/06/11     normal  . Knee arthroscopy Right 11/14/2014    Procedure: ARTHROSCOPY KNEE;  Surgeon: Dereck Leep, MD;  Location: [facility] ORS;  Service: Orthopedics;  Laterality: Right;  Posterior horn menicus, anterior lateral menicus grade 3 chondromalacia    Current Outpatient Prescriptions  Medication Sig Dispense Refill  . acetaminophen (TYLENOL) 500 MG tablet Take 500 mg by mouth every 6 (six) hours as needed for pain.    . Ascorbic Acid (VITAMIN C) 1000 MG tablet Take 1,000 mg by mouth daily.    Marland Kitchen atenolol (TENORMIN) 50 MG tablet Take 50 mg by mouth daily.    . calcium carbonate (OS-CAL - DOSED IN MG OF ELEMENTAL CALCIUM) 1250 MG tablet Take 1 tablet by mouth daily.    . Cinnamon 500 MG capsule Take 500 mg by mouth daily.    . lansoprazole (PREVACID) 30 MG capsule Take 30 mg by mouth 2 (two) times daily before a meal.     . Melatonin 10 MG TABS Take 10 mg by mouth at bedtime.    . Multiple Vitamins-Minerals (HAIR/SKIN/NAILS PO) Take by mouth daily.    . pramipexole (MIRAPEX) 0.5 MG tablet Take 0.5 mg by mouth 3 (three) times daily.    . Red Yeast Rice 600 MG  TABS Take 1,200 mg by mouth. 2 daily    . aspirin 81 MG tablet Take 81 mg by mouth daily.    . Cholecalciferol (VITAMIN D3) 1000 UNITS CAPS Take 5,000 Units by mouth daily.     No current facility-administered medications for this visit.    Family History  Problem Relation Age of Onset  . Diabetes Mother   . Ovarian cancer Paternal Aunt   . Cancer - Lung Paternal Uncle   . Diabetes Maternal Grandfather   . Hypertension Maternal Grandfather     ROS:  Pertinent items are noted in HPI.  Otherwise, a comprehensive ROS was negative.  Exam:   BP 108/68 mmHg  Pulse 76  Temp(Src) 98.7 F (37.1 C) (Oral)  Resp 16  Ht   Wt   LMP 02/08/2005 (Approximate)   Ht Readings from Last 3 Encounters:  11/14/14 5\' 6"  (1.676 m)    General appearance: alert, cooperative and appears stated age Head: Normocephalic, without obvious abnormality,  atraumatic Neck: no adenopathy, supple, symmetrical, trachea midline and thyroid normal to inspection and palpation Lungs: clear to auscultation bilaterally Breasts: normal appearance, no masses or tenderness Heart: regular rate and rhythm Abdomen: soft, non-tender; no masses,  no organomegaly Extremities: extremities normal, atraumatic, no cyanosis or edema Skin: Skin color, texture, turgor normal. No rashes or lesions Lymph nodes: Cervical, supraclavicular, and axillary nodes normal. No abnormal inguinal nodes palpated Neurologic: Grossly normal   Pelvic: External genitalia:  no lesions              Urethra:  normal appearing urethra with no masses, tenderness or lesions              Bartholin's and Skene's: normal                 Vagina: normal appearing vagina with normal color and discharge, no lesions              Cervix: anteverted              Pap taken: Yes.   Bimanual Exam:  Uterus:  normal size, contour, position, consistency, mobility, non-tender              Adnexa: no mass, fullness, tenderness               Rectovaginal: Confirms               Anus:  normal sphincter tone, no lesions  Chaperone present: yes  A:  Well Woman with normal exam  Postmenopausal off HRT fall 2012   Osteoarthritis with left hip replacement 04/2011  Right knee scope 11/14/2014  Vit D deficiency   P:   Reviewed health and wellness pertinent to exam  Pap smear as above  Mammogram is due and will schedule  Will follow with Vit D  Counseled on breast self exam, mammography screening, adequate intake of calcium and vitamin D, diet and exercise return annually or prn  An After Visit Summary was printed and given to the patient.

## 2015-02-08 NOTE — Patient Instructions (Signed)

## 2015-02-09 LAB — VITAMIN D 25 HYDROXY (VIT D DEFICIENCY, FRACTURES): Vit D, 25-Hydroxy: 62 ng/mL (ref 30–100)

## 2015-02-09 LAB — IPS PAP SMEAR ONLY

## 2015-02-23 ENCOUNTER — Encounter: Payer: Self-pay | Admitting: *Deleted

## 2015-02-23 ENCOUNTER — Encounter
Admission: RE | Disposition: A | Discharge status: Home / Self Care | Payer: Self-pay | Source: Ambulatory Visit | Attending: Unknown Physician Specialty

## 2015-02-23 ENCOUNTER — Ambulatory Visit: Payer: Medicare Other | Admitting: Anesthesiology

## 2015-02-23 ENCOUNTER — Ambulatory Visit
Admission: RE | Admit: 2015-02-23 | Discharge: 2015-02-23 | Disposition: A | Discharge status: Home / Self Care | Payer: Medicare Other | Source: Ambulatory Visit | Attending: Unknown Physician Specialty | Admitting: Unknown Physician Specialty

## 2015-02-23 DIAGNOSIS — Z96642 Presence of left artificial hip joint: Secondary | ICD-10-CM | POA: Insufficient documentation

## 2015-02-23 DIAGNOSIS — Z7982 Long term (current) use of aspirin: Secondary | ICD-10-CM | POA: Diagnosis not present

## 2015-02-23 DIAGNOSIS — K317 Polyp of stomach and duodenum: Secondary | ICD-10-CM | POA: Insufficient documentation

## 2015-02-23 DIAGNOSIS — G4730 Sleep apnea, unspecified: Secondary | ICD-10-CM | POA: Insufficient documentation

## 2015-02-23 DIAGNOSIS — I10 Essential (primary) hypertension: Secondary | ICD-10-CM | POA: Diagnosis not present

## 2015-02-23 DIAGNOSIS — G2581 Restless legs syndrome: Secondary | ICD-10-CM | POA: Insufficient documentation

## 2015-02-23 DIAGNOSIS — Z780 Asymptomatic menopausal state: Secondary | ICD-10-CM | POA: Diagnosis not present

## 2015-02-23 DIAGNOSIS — Z6830 Body mass index (BMI) 30.0-30.9, adult: Secondary | ICD-10-CM | POA: Insufficient documentation

## 2015-02-23 DIAGNOSIS — K2270 Barrett's esophagus without dysplasia: Secondary | ICD-10-CM | POA: Diagnosis present

## 2015-02-23 DIAGNOSIS — Z79899 Other long term (current) drug therapy: Secondary | ICD-10-CM | POA: Diagnosis not present

## 2015-02-23 DIAGNOSIS — K210 Gastro-esophageal reflux disease with esophagitis: Secondary | ICD-10-CM | POA: Diagnosis not present

## 2015-02-23 HISTORY — PX: ESOPHAGOGASTRODUODENOSCOPY (EGD) WITH PROPOFOL: SHX5813

## 2015-02-23 SURGERY — ESOPHAGOGASTRODUODENOSCOPY (EGD) WITH PROPOFOL
Anesthesia: General

## 2015-02-23 MED ORDER — GENTAMICIN IN SALINE 1.6-0.9 MG/ML-% IV SOLN
80.0000 mg | INTRAVENOUS | Status: AC
  Administered 2015-02-23: 80 mg via INTRAVENOUS
  Filled 2015-02-23: qty 50

## 2015-02-23 MED ORDER — MIDAZOLAM HCL 5 MG/5ML IJ SOLN
INTRAMUSCULAR | Status: DC | PRN
  Administered 2015-02-23: 1 mg via INTRAVENOUS

## 2015-02-23 MED ORDER — LIDOCAINE HCL (CARDIAC) 20 MG/ML IV SOLN
INTRAVENOUS | Status: DC | PRN
  Administered 2015-02-23: 100 mg via INTRAVENOUS

## 2015-02-23 MED ORDER — GLYCOPYRROLATE 0.2 MG/ML IJ SOLN
INTRAMUSCULAR | Status: DC | PRN
  Administered 2015-02-23: .3 mg via INTRAVENOUS

## 2015-02-23 MED ORDER — VANCOMYCIN HCL IN DEXTROSE 750-5 MG/150ML-% IV SOLN
750.0000 mg | INTRAVENOUS | Status: AC
  Administered 2015-02-23: 750 mg via INTRAVENOUS
  Filled 2015-02-23: qty 150

## 2015-02-23 MED ORDER — SODIUM CHLORIDE 0.9 % IV SOLN: INTRAVENOUS | Status: DC

## 2015-02-23 MED ORDER — SODIUM CHLORIDE 0.9 % IV SOLN
INTRAVENOUS | Status: DC
  Administered 2015-02-23: 08:00:00 via INTRAVENOUS

## 2015-02-23 MED ORDER — ALFENTANIL 500 MCG/ML IJ INJ
INJECTION | INTRAMUSCULAR | Status: DC | PRN
  Administered 2015-02-23: 500 ug via INTRAVENOUS

## 2015-02-23 MED ORDER — PROPOFOL INFUSION 10 MG/ML OPTIME
INTRAVENOUS | Status: DC | PRN
  Administered 2015-02-23: 75 ug/kg/min via INTRAVENOUS

## 2015-02-23 MED ORDER — PROPOFOL 10 MG/ML IV BOLUS
INTRAVENOUS | Status: DC | PRN
  Administered 2015-02-23: 40 mg via INTRAVENOUS

## 2015-02-23 NOTE — Op Note (Signed)
[facility] Gastroenterology Patient Name: Claudia Terry Procedure Date: 02/23/2015 9:54 AM MRN: [id number] Account #: [id number] Date of Birth: December 27, 1945 Admit Type: Outpatient Age: 69 Room: [facility] ENDO ROOM 1 Gender: Female Note Status: Finalized Procedure:         Upper GI endoscopy Indications:       Follow-up of Barrett's esophagus Providers:         Manya Silvas, MD Referring MD:      Caprice Renshaw (Referring MD) Medicines:         Propofol per Anesthesia Complications:     No immediate complications. Procedure:         Pre-Anesthesia Assessment:                    - After reviewing the risks and benefits, the patient was                     deemed in satisfactory condition to undergo the procedure.                    After obtaining informed consent, the endoscope was passed                     under direct vision. Throughout the procedure, the                     patient's blood pressure, pulse, and oxygen saturations                     were monitored continuously. The Olympus GIF-160 endoscope                     (S#. [id number]) was introduced through the mouth, and                     advanced to the second part of duodenum. The upper GI                     endoscopy was accomplished without difficulty. The patient                     tolerated the procedure well. Findings:      LA Grade A (one or more mucosal breaks less than 5 mm, not extending       between tops of 2 mucosal folds) esophagitis with no bleeding was found       39 cm from the incisors. Biopsies were taken with a cold forceps for       histology.      A single diminutive sessile fundic polyp with no bleeding and no       stigmata of recent bleeding was found in the cardia. These are harrmless.      The entire examined stomach was normal.      The examined duodenum was normal. Impression:        - LA Grade A reflux esophagitis. Rule out Barrett's                     esophagus.  Biopsied.                    - A single gastric polyp.                    - Normal stomach.                    -  Normal examined duodenum. Recommendation:    - Await pathology results. Manya Silvas, MD 02/23/2015 10:08:08 AM This report has been signed electronically. Number of Addenda: 0 Note Initiated On: 02/23/2015 9:54 AM      [facility]

## 2015-02-23 NOTE — Anesthesia Postprocedure Evaluation (Signed)
  Anesthesia Post-op Note  Patient: Claudia Terry  Procedure(s) Performed: Procedure(s): ESOPHAGOGASTRODUODENOSCOPY (EGD) WITH PROPOFOL (N/A)  Anesthesia type:General  Patient location: PACU  Post pain: Pain level controlled  Post assessment: Post-op Vital signs reviewed, Patient's Cardiovascular Status Stable, Respiratory Function Stable, Patent Airway and No signs of Nausea or vomiting  Post vital signs: Reviewed and stable  Last Vitals:  Filed Vitals:   02/23/15 1011  BP: 108/71  Pulse:   Temp: 36.1 C  Resp: 14    Level of consciousness: awake, alert  and patient cooperative  Complications: No apparent anesthesia complications

## 2015-02-23 NOTE — Anesthesia Preprocedure Evaluation (Signed)
Anesthesia Evaluation  Patient identified by MRN, date of birth, ID band Patient awake    Reviewed: Allergy & Precautions, NPO status , Patient's Chart, lab work & pertinent test results  Airway Mallampati: III       Dental  (+) Teeth Intact   Pulmonary sleep apnea ,    Pulmonary exam normal        Cardiovascular hypertension, Pt. on home beta blockers Normal cardiovascular exam+ dysrhythmias      Neuro/Psych    GI/Hepatic Neg liver ROS, GERD  ,  Endo/Other  Morbid obesity  Renal/GU negative Renal ROS     Musculoskeletal   Abdominal (+) + obese,   Peds  Hematology   Anesthesia Other Findings   Reproductive/Obstetrics                             Anesthesia Physical Anesthesia Plan  ASA: III  Anesthesia Plan: General   Post-op Pain Management:    Induction: Intravenous  Airway Management Planned: Nasal Cannula  Additional Equipment:   Intra-op Plan:   Post-operative Plan:   Informed Consent: I have reviewed the patients History and Physical, chart, labs and discussed the procedure including the risks, benefits and alternatives for the proposed anesthesia with the patient or authorized representative who has indicated his/her understanding and acceptance.     Plan Discussed with: CRNA  Anesthesia Plan Comments:         Anesthesia Quick Evaluation

## 2015-02-23 NOTE — Transfer of Care (Signed)
Immediate Anesthesia Transfer of Care Note  Patient: Claudia Terry  Procedure(s) Performed: Procedure(s): ESOPHAGOGASTRODUODENOSCOPY (EGD) WITH PROPOFOL (N/A)  Patient Location: PACU  Anesthesia Type:General  Level of Consciousness: sedated  Airway & Oxygen Therapy: Patient Spontanous Breathing and Patient connected to nasal cannula oxygen  Post-op Assessment: Report given to RN and Post -op Vital signs reviewed and stable  Post vital signs: Reviewed and stable  Last Vitals:  Filed Vitals:   02/23/15 1011  BP: 108/71  Pulse:   Temp: 36.1 C  Resp: 14    Complications: No apparent anesthesia complications

## 2015-02-23 NOTE — H&P (Signed)
Primary Care Physician:  Marcello Fennel, MD Primary Gastroenterologist:  Dr. Vira Agar  Pre-Procedure History & Physical: HPI:  Claudia Terry is a 69 y.o. female is here for an endoscopy.   Past Medical History  Diagnosis Date  . Menopause   . Low back pain 11/95  . Restless leg syndrome 1999  . Fatty tumor 10/02    R kidney Dr. Serita Butcher  . GERD (gastroesophageal reflux disease) 8/01     Dr. Tiffany Kocher  . Sleep apnea 12/11  . Dysrhythmia     Past Surgical History  Procedure Laterality Date  . Phlebitis groin    . Total hip arthroplasty Left 05/08/11    necrosis of hip  . Cesarean section  1972, 1975    X2  . Colonoscopy  12/06/11    normal  . Knee arthroscopy Right 11/14/2014    Procedure: ARTHROSCOPY KNEE;  Surgeon: Dereck Leep, MD;  Location: [facility] ORS;  Service: Orthopedics;  Laterality: Right;  Posterior horn menicus, anterior lateral menicus grade 3 chondromalacia    Prior to Admission medications   Medication Sig Start Date End Date Taking? Authorizing Provider  acetaminophen (TYLENOL) 500 MG tablet Take 500 mg by mouth every 6 (six) hours as needed for pain.   Yes Historical Provider, MD  Ascorbic Acid (VITAMIN C) 1000 MG tablet Take 1,000 mg by mouth daily.   Yes Historical Provider, MD  aspirin 81 MG tablet Take 81 mg by mouth daily.   Yes Historical Provider, MD  atenolol (TENORMIN) 50 MG tablet Take 50 mg by mouth daily.   Yes Historical Provider, MD  calcium carbonate (OS-CAL - DOSED IN MG OF ELEMENTAL CALCIUM) 1250 MG tablet Take 1 tablet by mouth daily.   Yes Historical Provider, MD  Cholecalciferol (VITAMIN D3) 1000 UNITS CAPS Take 5,000 Units by mouth daily.   Yes Historical Provider, MD  Cinnamon 500 MG capsule Take 500 mg by mouth daily.   Yes Historical Provider, MD  lansoprazole (PREVACID) 30 MG capsule Take 30 mg by mouth 2 (two) times daily before a meal.    Yes Historical Provider, MD  Melatonin 10 MG TABS Take 10 mg by mouth at bedtime.   Yes  Historical Provider, MD  Multiple Vitamins-Minerals (HAIR/SKIN/NAILS PO) Take by mouth daily.   Yes Historical Provider, MD  pramipexole (MIRAPEX) 0.5 MG tablet Take 0.5 mg by mouth 3 (three) times daily.   Yes Historical Provider, MD  Red Yeast Rice 600 MG TABS Take 1,200 mg by mouth. 2 daily   Yes Historical Provider, MD    Allergies as of 12/07/2014 - Review Complete 11/14/2014  Allergen Reaction Noted  . Epinephrine  11/02/2014  . Erythromycin  11/02/2014  . Penicillins  11/09/2012  . Tetracyclines & related  11/09/2012    Family History  Problem Relation Age of Onset  . Diabetes Mother   . Ovarian cancer Paternal Aunt   . Cancer - Lung Paternal Uncle   . Diabetes Maternal Grandfather   . Hypertension Maternal Grandfather     Social History   Social History  . Marital Status: Married    Spouse Name: N/A  . Number of Children: N/A  . Years of Education: N/A   Occupational History  . Not on file.   Social History Main Topics  . Smoking status: Never Smoker   . Smokeless tobacco: Never Used  . Alcohol Use: No  . Drug Use: No  . Sexual Activity: Yes    Birth Control/ Protection:  Post-menopausal   Other Topics Concern  . Not on file   Social History Narrative    Review of Systems: See HPI, otherwise negative ROS  Physical Exam: BP 130/70 mmHg  Pulse 71  Temp(Src) 97.3 F (36.3 C) (Tympanic)  Resp 14  Ht 5\' 6"  (1.676 m)  Wt 86.183 kg (190 lb)  BMI 30.68 kg/m2  SpO2 100%  LMP 02/08/2005 General:   Alert,  pleasant and cooperative in NAD Head:  Normocephalic and atraumatic. Neck:  Supple; no masses or thyromegaly. Lungs:  Clear throughout to auscultation.    Heart:  Regular rate and rhythm. Abdomen:  Soft, nontender and nondistended. Normal bowel sounds, without guarding, and without rebound.   Neurologic:  Alert and  oriented x4;  grossly normal neurologically.  Impression/Plan: Claudia Terry is here for an endoscopy to be performed for Follow up  Barretts esophagus  Risks, benefits, limitations, and alternatives regarding  endoscopy have been reviewed with the patient.  Questions have been answered.  All parties agreeable.   Gaylyn Cheers, MD  02/23/2015, 9:45 AM

## 2015-02-24 LAB — SURGICAL PATHOLOGY

## 2015-02-25 ENCOUNTER — Encounter: Payer: Self-pay | Admitting: Unknown Physician Specialty

## 2015-06-07 ENCOUNTER — Encounter
Admission: RE | Admit: 2015-06-07 | Discharge: 2015-06-07 | Disposition: A | Discharge status: Home / Self Care | Payer: Medicare Other | Source: Ambulatory Visit | Attending: Orthopedic Surgery | Admitting: Orthopedic Surgery

## 2015-06-07 DIAGNOSIS — Z01812 Encounter for preprocedural laboratory examination: Secondary | ICD-10-CM | POA: Insufficient documentation

## 2015-06-07 DIAGNOSIS — M545 Low back pain: Secondary | ICD-10-CM | POA: Diagnosis not present

## 2015-06-07 DIAGNOSIS — K219 Gastro-esophageal reflux disease without esophagitis: Secondary | ICD-10-CM | POA: Insufficient documentation

## 2015-06-07 HISTORY — DX: Unspecified osteoarthritis, unspecified site: M19.90

## 2015-06-07 LAB — URINALYSIS COMPLETE WITH MICROSCOPIC (ARMC ONLY)
Bacteria, UA: NONE SEEN
Bilirubin Urine: NEGATIVE
Glucose, UA: NEGATIVE mg/dL
Hgb urine dipstick: NEGATIVE
Leukocytes, UA: NEGATIVE
Nitrite: NEGATIVE
Protein, ur: NEGATIVE mg/dL
Specific Gravity, Urine: 1.027 (ref 1.005–1.030)
pH: 5 (ref 5.0–8.0)

## 2015-06-07 LAB — BASIC METABOLIC PANEL
Anion gap: 8 (ref 5–15)
BUN: 21 mg/dL — ABNORMAL HIGH (ref 6–20)
CO2: 27 mmol/L (ref 22–32)
Calcium: 9.6 mg/dL (ref 8.9–10.3)
Chloride: 101 mmol/L (ref 101–111)
Creatinine, Ser: 0.74 mg/dL (ref 0.44–1.00)
GFR calc Af Amer: >60 mL/min (ref >60)
GFR calc non Af Amer: >60 mL/min (ref >60)
Glucose, Bld: 99 mg/dL (ref 65–99)
Potassium: 3.6 mmol/L (ref 3.5–5.1)
Sodium: 136 mmol/L (ref 135–145)

## 2015-06-07 LAB — CBC
HCT: 41.7 % (ref 35.0–47.0)
Hemoglobin: 14.3 g/dL (ref 12.0–16.0)
MCH: 32.4 pg (ref 26.0–34.0)
MCHC: 34.4 g/dL (ref 32.0–36.0)
MCV: 94.3 fL (ref 80.0–100.0)
Platelets: 226 10*3/uL (ref 150–440)
RBC: 4.42 MIL/uL (ref 3.80–5.20)
RDW: 12.5 % (ref 11.5–14.5)
WBC: 8.2 10*3/uL (ref 3.6–11.0)

## 2015-06-07 LAB — ABO/RH: ABO/RH(D): O POS

## 2015-06-07 LAB — APTT: aPTT: 25 seconds (ref 24–36)

## 2015-06-07 LAB — TYPE AND SCREEN
ABO/RH(D): O POS
Antibody Screen: NEGATIVE

## 2015-06-07 LAB — SURGICAL PCR SCREEN
MRSA, PCR: NEGATIVE
Staphylococcus aureus: NEGATIVE

## 2015-06-07 LAB — SEDIMENTATION RATE: Sed Rate: 22 mm/hr (ref 0–30)

## 2015-06-07 LAB — PROTIME-INR
INR: 0.94
Prothrombin Time: 12.8 seconds (ref 11.4–15.0)

## 2015-06-07 NOTE — Pre-Procedure Instructions (Signed)
Pt had phlebitis of the groin 40 years ago, Tranexamic Acid 1.5 grams IVPB on call to OR noted on pre-op order sheet. Clyde Canterbury at Dr. Clydell Hakim office regarding above.  Tranexamic Acid IVPB is on hold until we hear back from Dr. Marry Guan.

## 2015-06-07 NOTE — Patient Instructions (Signed)
  Your procedure is scheduled on: Wednesday Jan. 4, 2017. Report to Same Day Surgery. To find out your arrival time please call [phone] between 1PM - 3PM on Tuesday Jan. 3, 2017.  Remember: Instructions that are not followed completely may result in serious medical risk, up to and including death, or upon the discretion of your surgeon and anesthesiologist your surgery may need to be rescheduled.    _x___ 1. Do not eat food or drink liquids after midnight. No gum chewing or hard candies.     ____ 2. No Alcohol for 24 hours before or after surgery.   ____ 3. Bring all medications with you on the day of surgery if instructed.    __x__ 4. Notify your doctor if there is any change in your medical condition     (cold, fever, infections).     Do not wear jewelry, make-up, hairpins, clips or nail polish.  Do not wear lotions, powders, or perfumes. You may wear deodorant.  Do not shave 48 hours prior to surgery. Men may shave face and neck.  Do not bring valuables to the hospital.    [facility] is not responsible for any belongings or valuables.               Contacts, dentures or bridgework may not be worn into surgery.  Leave your suitcase in the car. After surgery it may be brought to your room.  For patients admitted to the hospital, discharge time is determined by your treatment team.   Patients discharged the day of surgery will not be allowed to drive home.    Please read over the following fact sheets that you were given:   [facility] Preparing for Surgery  __x__ Take these medicines the morning of surgery with A SIP OF WATER:    1. atenolol (TENORMIN)  2. omeprazole (PRILOSEC)    ____ Fleet Enema (as directed)   __x__ Use CHG Soap as directed  ____ Use inhalers on the day of surgery  ____ Stop metformin 2 days prior to surgery    ____ Take 1/2 of usual insulin dose the night before surgery and none on the morning of surgery.   ____ Stop Coumadin/Plavix/aspirin  on does not apply.  __x__ Stop Anti-inflammatories now.  Tylenol is OK to take for pain.   ____ Stop supplements until after surgery.    ____ Bring C-Pap to the hospital.

## 2015-06-08 LAB — URINE CULTURE
Culture: NO GROWTH
Special Requests: NORMAL

## 2015-06-09 NOTE — Pre-Procedure Instructions (Signed)
Received a faxed note from Dr. Marry Guan regarding tranexamic acid order.  Dr. Marry Guan has reviewed pt's history and has OK'd pt receiving the tranexamic acid preoperatively.

## 2015-06-14 ENCOUNTER — Inpatient Hospital Stay: Payer: PPO | Admitting: Anesthesiology

## 2015-06-14 ENCOUNTER — Inpatient Hospital Stay: Payer: PPO

## 2015-06-14 ENCOUNTER — Inpatient Hospital Stay
Admission: RE | Admit: 2015-06-14 | Discharge: 2015-06-16 | DRG: 470 | Disposition: A | Discharge status: Home Health | Payer: PPO | Source: Ambulatory Visit | Attending: Orthopedic Surgery | Admitting: Orthopedic Surgery

## 2015-06-14 ENCOUNTER — Encounter
Admission: RE | Disposition: A | Discharge status: Home Health | Payer: Self-pay | Source: Ambulatory Visit | Attending: Orthopedic Surgery

## 2015-06-14 ENCOUNTER — Encounter: Payer: Self-pay | Admitting: Orthopedic Surgery

## 2015-06-14 DIAGNOSIS — M1611 Unilateral primary osteoarthritis, right hip: Secondary | ICD-10-CM | POA: Diagnosis present

## 2015-06-14 DIAGNOSIS — Z79899 Other long term (current) drug therapy: Secondary | ICD-10-CM

## 2015-06-14 DIAGNOSIS — G2581 Restless legs syndrome: Secondary | ICD-10-CM | POA: Diagnosis present

## 2015-06-14 DIAGNOSIS — K219 Gastro-esophageal reflux disease without esophagitis: Secondary | ICD-10-CM | POA: Diagnosis present

## 2015-06-14 DIAGNOSIS — Z880 Allergy status to penicillin: Secondary | ICD-10-CM

## 2015-06-14 DIAGNOSIS — Z96641 Presence of right artificial hip joint: Secondary | ICD-10-CM | POA: Diagnosis not present

## 2015-06-14 DIAGNOSIS — Z888 Allergy status to other drugs, medicaments and biological substances status: Secondary | ICD-10-CM

## 2015-06-14 DIAGNOSIS — Z96649 Presence of unspecified artificial hip joint: Secondary | ICD-10-CM

## 2015-06-14 DIAGNOSIS — Z471 Aftercare following joint replacement surgery: Secondary | ICD-10-CM | POA: Diagnosis not present

## 2015-06-14 HISTORY — PX: TOTAL HIP ARTHROPLASTY: SHX124

## 2015-06-14 SURGERY — ARTHROPLASTY, HIP, TOTAL,POSTERIOR APPROACH
Anesthesia: General | Site: Hip | Laterality: Right | Wound class: Clean

## 2015-06-14 MED ORDER — PROMETHAZINE HCL 25 MG/ML IJ SOLN
INTRAMUSCULAR | Status: AC
  Administered 2015-06-14: 12.5 mg via INTRAVENOUS
  Filled 2015-06-14: qty 1

## 2015-06-14 MED ORDER — MENTHOL 3 MG MT LOZG
1.0000 | LOZENGE | OROMUCOSAL | Status: DC | PRN
Start: 2015-06-14 — End: 2015-06-16

## 2015-06-14 MED ORDER — HYDROMORPHONE HCL 1 MG/ML IJ SOLN
INTRAMUSCULAR | Status: AC
  Administered 2015-06-14: 0.25 mg via INTRAVENOUS
  Filled 2015-06-14: qty 1

## 2015-06-14 MED ORDER — CLINDAMYCIN PHOSPHATE 600 MG/50ML IV SOLN: 600.0000 mg | Freq: Once | INTRAVENOUS | Status: DC

## 2015-06-14 MED ORDER — PHENYLEPHRINE HCL 10 MG/ML IJ SOLN
INTRAMUSCULAR | Status: DC | PRN
  Administered 2015-06-14: 100 ug via INTRAVENOUS

## 2015-06-14 MED ORDER — CLINDAMYCIN PHOSPHATE 600 MG/50ML IV SOLN
600.0000 mg | Freq: Four times a day (QID) | INTRAVENOUS | Status: AC
  Administered 2015-06-14 – 2015-06-15 (×4): 600 mg via INTRAVENOUS
  Filled 2015-06-14 (×4): qty 50

## 2015-06-14 MED ORDER — METOCLOPRAMIDE HCL 10 MG PO TABS
10.0000 mg | ORAL_TABLET | Freq: Three times a day (TID) | ORAL | Status: AC
  Administered 2015-06-14 – 2015-06-16 (×8): 10 mg via ORAL
  Filled 2015-06-14 (×8): qty 1

## 2015-06-14 MED ORDER — LORAZEPAM 1 MG PO TABS
1.0000 mg | ORAL_TABLET | Freq: Four times a day (QID) | ORAL | Status: DC | PRN
  Administered 2015-06-14 – 2015-06-16 (×3): 1 mg via ORAL
  Filled 2015-06-14 (×3): qty 1

## 2015-06-14 MED ORDER — ACETAMINOPHEN 325 MG PO TABS: 650.0000 mg | ORAL_TABLET | Freq: Four times a day (QID) | ORAL | Status: DC | PRN

## 2015-06-14 MED ORDER — MORPHINE SULFATE (PF) 2 MG/ML IV SOLN
2.0000 mg | INTRAVENOUS | Status: DC | PRN
  Administered 2015-06-14: 2 mg via INTRAVENOUS
  Filled 2015-06-14: qty 1

## 2015-06-14 MED ORDER — ADULT MULTIVITAMIN W/MINERALS CH
1.0000 | ORAL_TABLET | Freq: Two times a day (BID) | ORAL | Status: DC
  Administered 2015-06-15 – 2015-06-16 (×3): 1 via ORAL
  Filled 2015-06-14 (×4): qty 1

## 2015-06-14 MED ORDER — MIDAZOLAM HCL 5 MG/5ML IJ SOLN
INTRAMUSCULAR | Status: DC | PRN
  Administered 2015-06-14: 2 mg via INTRAVENOUS

## 2015-06-14 MED ORDER — MEPERIDINE HCL 25 MG/ML IJ SOLN: 25.0000 mg | Freq: Once | INTRAMUSCULAR | Status: DC

## 2015-06-14 MED ORDER — ACETAMINOPHEN 10 MG/ML IV SOLN
INTRAVENOUS | Status: DC | PRN
  Administered 2015-06-14: 1000 mg via INTRAVENOUS

## 2015-06-14 MED ORDER — MAGNESIUM HYDROXIDE 400 MG/5ML PO SUSP
30.0000 mL | Freq: Every day | ORAL | Status: DC | PRN
  Administered 2015-06-14 – 2015-06-15 (×2): 30 mL via ORAL
  Filled 2015-06-14 (×2): qty 30

## 2015-06-14 MED ORDER — MEPERIDINE HCL 25 MG/ML IJ SOLN
INTRAMUSCULAR | Status: AC
  Filled 2015-06-14: qty 1

## 2015-06-14 MED ORDER — TRANEXAMIC ACID 1000 MG/10ML IV SOLN
1500.0000 mg | INTRAVENOUS | Status: AC
  Administered 2015-06-14: 1500 mg via INTRAVENOUS
  Filled 2015-06-14: qty 15

## 2015-06-14 MED ORDER — NEOMYCIN-POLYMYXIN B GU 40-200000 IR SOLN
Status: AC
Start: 2015-06-14 — End: 2015-06-14
  Filled 2015-06-14: qty 20

## 2015-06-14 MED ORDER — TRAMADOL HCL 50 MG PO TABS
50.0000 mg | ORAL_TABLET | ORAL | Status: DC | PRN
  Administered 2015-06-14 (×2): 50 mg via ORAL
  Administered 2015-06-15 – 2015-06-16 (×7): 100 mg via ORAL
  Filled 2015-06-14: qty 2
  Filled 2015-06-14: qty 1
  Filled 2015-06-14 (×3): qty 2
  Filled 2015-06-14: qty 1
  Filled 2015-06-14 (×4): qty 2

## 2015-06-14 MED ORDER — PROMETHAZINE HCL 25 MG/ML IJ SOLN
12.5000 mg | Freq: Once | INTRAMUSCULAR | Status: AC
  Administered 2015-06-14: 12.5 mg via INTRAVENOUS

## 2015-06-14 MED ORDER — FERROUS SULFATE 325 (65 FE) MG PO TABS
325.0000 mg | ORAL_TABLET | Freq: Two times a day (BID) | ORAL | Status: DC
  Administered 2015-06-15 – 2015-06-16 (×3): 325 mg via ORAL
  Filled 2015-06-14 (×3): qty 1

## 2015-06-14 MED ORDER — ONDANSETRON HCL 4 MG/2ML IJ SOLN
INTRAMUSCULAR | Status: DC | PRN
  Administered 2015-06-14: 4 mg via INTRAVENOUS

## 2015-06-14 MED ORDER — SENNOSIDES-DOCUSATE SODIUM 8.6-50 MG PO TABS
1.0000 | ORAL_TABLET | Freq: Two times a day (BID) | ORAL | Status: DC
  Administered 2015-06-14 – 2015-06-16 (×5): 1 via ORAL
  Filled 2015-06-14 (×5): qty 1

## 2015-06-14 MED ORDER — PRAMIPEXOLE DIHYDROCHLORIDE 0.25 MG PO TABS: 0.5000 mg | ORAL_TABLET | Freq: Every day | ORAL | Status: DC

## 2015-06-14 MED ORDER — LORATADINE 10 MG PO TABS
5.0000 mg | ORAL_TABLET | Freq: Two times a day (BID) | ORAL | Status: DC
  Administered 2015-06-14 – 2015-06-16 (×3): 5 mg via ORAL
  Filled 2015-06-14 (×4): qty 1

## 2015-06-14 MED ORDER — LACTATED RINGERS IV SOLN
INTRAVENOUS | Status: DC
  Administered 2015-06-14 (×2): via INTRAVENOUS

## 2015-06-14 MED ORDER — ACETAMINOPHEN 650 MG RE SUPP: 650.0000 mg | Freq: Four times a day (QID) | RECTAL | Status: DC | PRN

## 2015-06-14 MED ORDER — ALUM & MAG HYDROXIDE-SIMETH 200-200-20 MG/5ML PO SUSP: 30.0000 mL | ORAL | Status: DC | PRN

## 2015-06-14 MED ORDER — PHENOL 1.4 % MT LIQD: 1.0000 | OROMUCOSAL | Status: DC | PRN

## 2015-06-14 MED ORDER — PROPOFOL 10 MG/ML IV BOLUS
INTRAVENOUS | Status: DC | PRN
  Administered 2015-06-14: 160 mg via INTRAVENOUS

## 2015-06-14 MED ORDER — TRANEXAMIC ACID 1000 MG/10ML IV SOLN
1000.0000 mg | Freq: Once | INTRAVENOUS | Status: AC
  Administered 2015-06-14: 1000 mg via INTRAVENOUS
  Filled 2015-06-14: qty 10

## 2015-06-14 MED ORDER — KETAMINE HCL 50 MG/ML IJ SOLN
INTRAMUSCULAR | Status: DC | PRN
  Administered 2015-06-14: 50 mg via INTRAVENOUS

## 2015-06-14 MED ORDER — PSEUDOEPHEDRINE HCL ER 120 MG PO TB12
120.0000 mg | ORAL_TABLET | Freq: Two times a day (BID) | ORAL | Status: DC
  Administered 2015-06-14 – 2015-06-16 (×3): 120 mg via ORAL
  Filled 2015-06-14 (×6): qty 1

## 2015-06-14 MED ORDER — ONDANSETRON HCL 4 MG PO TABS: 4.0000 mg | ORAL_TABLET | Freq: Four times a day (QID) | ORAL | Status: DC | PRN

## 2015-06-14 MED ORDER — ATENOLOL 50 MG PO TABS
50.0000 mg | ORAL_TABLET | Freq: Every day | ORAL | Status: DC
  Administered 2015-06-15 – 2015-06-16 (×2): 50 mg via ORAL
  Filled 2015-06-14 (×2): qty 1

## 2015-06-14 MED ORDER — PRAMIPEXOLE DIHYDROCHLORIDE 0.25 MG PO TABS
0.5000 mg | ORAL_TABLET | Freq: Every day | ORAL | Status: DC
  Administered 2015-06-14: 0.5 mg via ORAL
  Filled 2015-06-14: qty 2

## 2015-06-14 MED ORDER — FENTANYL CITRATE (PF) 100 MCG/2ML IJ SOLN
25.0000 ug | INTRAMUSCULAR | Status: AC | PRN
  Administered 2015-06-14 (×6): 25 ug via INTRAVENOUS

## 2015-06-14 MED ORDER — CLINDAMYCIN PHOSPHATE 600 MG/50ML IV SOLN
INTRAVENOUS | Status: AC
  Administered 2015-06-14: 600 mg via INTRAVENOUS
  Filled 2015-06-14: qty 50

## 2015-06-14 MED ORDER — FENTANYL CITRATE (PF) 100 MCG/2ML IJ SOLN
INTRAMUSCULAR | Status: AC
  Administered 2015-06-14: 25 ug via INTRAVENOUS
  Filled 2015-06-14: qty 2

## 2015-06-14 MED ORDER — ACETAMINOPHEN 10 MG/ML IV SOLN
1000.0000 mg | Freq: Four times a day (QID) | INTRAVENOUS | Status: AC
  Administered 2015-06-14 – 2015-06-15 (×4): 1000 mg via INTRAVENOUS
  Filled 2015-06-14 (×4): qty 100

## 2015-06-14 MED ORDER — GLYCOPYRROLATE 0.2 MG/ML IJ SOLN
INTRAMUSCULAR | Status: DC | PRN
  Administered 2015-06-14: .15 mg via INTRAVENOUS

## 2015-06-14 MED ORDER — VITAMIN D 1000 UNITS PO TABS
5000.0000 [IU] | ORAL_TABLET | Freq: Every day | ORAL | Status: DC
Start: 2015-06-15 — End: 2015-06-16
  Administered 2015-06-15 – 2015-06-16 (×2): 5000 [IU] via ORAL
  Filled 2015-06-14 (×2): qty 5

## 2015-06-14 MED ORDER — OXYCODONE HCL 5 MG PO TABS
5.0000 mg | ORAL_TABLET | ORAL | Status: DC | PRN
  Administered 2015-06-14: 5 mg via ORAL
  Administered 2015-06-14: 10 mg via ORAL
  Administered 2015-06-14: 5 mg via ORAL
  Filled 2015-06-14 (×2): qty 1
  Filled 2015-06-14: qty 2

## 2015-06-14 MED ORDER — VITAMIN C 500 MG PO TABS
1000.0000 mg | ORAL_TABLET | Freq: Every day | ORAL | Status: DC
Start: 2015-06-15 — End: 2015-06-16
  Administered 2015-06-15 – 2015-06-16 (×2): 1000 mg via ORAL
  Filled 2015-06-14 (×2): qty 2

## 2015-06-14 MED ORDER — HYDROMORPHONE HCL 1 MG/ML IJ SOLN
INTRAMUSCULAR | Status: DC | PRN
  Administered 2015-06-14: .2 mg via INTRAVENOUS
  Administered 2015-06-14: .5 mg via INTRAVENOUS
  Administered 2015-06-14: .1 mg via INTRAVENOUS

## 2015-06-14 MED ORDER — ACETAMINOPHEN 10 MG/ML IV SOLN
INTRAVENOUS | Status: AC
  Filled 2015-06-14: qty 100

## 2015-06-14 MED ORDER — BISACODYL 10 MG RE SUPP
10.0000 mg | Freq: Every day | RECTAL | Status: DC | PRN
Start: 2015-06-14 — End: 2015-06-16
  Administered 2015-06-16: 10 mg via RECTAL
  Filled 2015-06-14: qty 1

## 2015-06-14 MED ORDER — FENTANYL CITRATE (PF) 100 MCG/2ML IJ SOLN
INTRAMUSCULAR | Status: DC | PRN
  Administered 2015-06-14: 100 ug via INTRAVENOUS

## 2015-06-14 MED ORDER — SODIUM CHLORIDE 0.9 % IV SOLN
INTRAVENOUS | Status: DC
  Administered 2015-06-14 – 2015-06-15 (×2): via INTRAVENOUS

## 2015-06-14 MED ORDER — ONDANSETRON HCL 4 MG/2ML IJ SOLN: 4.0000 mg | Freq: Once | INTRAMUSCULAR | Status: DC | PRN

## 2015-06-14 MED ORDER — DEXAMETHASONE SODIUM PHOSPHATE 10 MG/ML IJ SOLN
INTRAMUSCULAR | Status: DC | PRN
  Administered 2015-06-14: 5 mg via INTRAVENOUS

## 2015-06-14 MED ORDER — HYDROMORPHONE HCL 1 MG/ML IJ SOLN
INTRAMUSCULAR | Status: AC
  Filled 2015-06-14: qty 1

## 2015-06-14 MED ORDER — LIDOCAINE HCL (CARDIAC) 20 MG/ML IV SOLN
INTRAVENOUS | Status: DC | PRN
  Administered 2015-06-14: 40 mg via INTRAVENOUS

## 2015-06-14 MED ORDER — HYDROMORPHONE HCL 1 MG/ML IJ SOLN
INTRAMUSCULAR | Status: AC
  Administered 2015-06-14: 0.5 mg via INTRAVENOUS
  Filled 2015-06-14: qty 1

## 2015-06-14 MED ORDER — NEOMYCIN-POLYMYXIN B GU 40-200000 IR SOLN
Status: DC | PRN
  Administered 2015-06-14: 14 mL

## 2015-06-14 MED ORDER — MORPHINE SULFATE (PF) 2 MG/ML IV SOLN
2.0000 mg | INTRAVENOUS | Status: DC | PRN
  Filled 2015-06-14: qty 2

## 2015-06-14 MED ORDER — ONDANSETRON HCL 4 MG/2ML IJ SOLN: 4.0000 mg | Freq: Four times a day (QID) | INTRAMUSCULAR | Status: DC | PRN

## 2015-06-14 MED ORDER — LORATADINE-PSEUDOEPHEDRINE ER 10-240 MG PO TB24: 1.0000 | ORAL_TABLET | Freq: Every day | ORAL | Status: DC

## 2015-06-14 MED ORDER — HYDROMORPHONE HCL 1 MG/ML IJ SOLN
0.2500 mg | INTRAMUSCULAR | Status: DC | PRN
  Administered 2015-06-14: 0.25 mg via INTRAVENOUS
  Administered 2015-06-14: 0.5 mg via INTRAVENOUS
  Administered 2015-06-14: 0.25 mg via INTRAVENOUS
  Administered 2015-06-14 (×2): 0.5 mg via INTRAVENOUS

## 2015-06-14 MED ORDER — FLEET ENEMA 7-19 GM/118ML RE ENEM: 1.0000 | ENEMA | Freq: Once | RECTAL | Status: DC | PRN

## 2015-06-14 MED ORDER — DIPHENHYDRAMINE HCL 12.5 MG/5ML PO ELIX: 12.5000 mg | ORAL_SOLUTION | ORAL | Status: DC | PRN

## 2015-06-14 MED ORDER — OXYCODONE HCL 5 MG PO TABS
5.0000 mg | ORAL_TABLET | ORAL | Status: DC | PRN
  Administered 2015-06-15 – 2015-06-16 (×8): 10 mg via ORAL
  Filled 2015-06-14 (×8): qty 2

## 2015-06-14 MED ORDER — MORPHINE SULFATE (PF) 4 MG/ML IV SOLN
INTRAVENOUS | Status: AC
  Administered 2015-06-14: 4 mg
  Filled 2015-06-14: qty 1

## 2015-06-14 MED ORDER — ENOXAPARIN SODIUM 30 MG/0.3ML ~~LOC~~ SOLN
30.0000 mg | Freq: Two times a day (BID) | SUBCUTANEOUS | Status: DC
  Administered 2015-06-15 – 2015-06-16 (×3): 30 mg via SUBCUTANEOUS
  Filled 2015-06-14 (×3): qty 0.3

## 2015-06-14 SURGICAL SUPPLY — 54 items
BLADE DRUM FLTD (BLADE) ×3 IMPLANT
BLADE SAW 1 (BLADE) ×3 IMPLANT
CANISTER SUCT 1200ML W/VALVE (MISCELLANEOUS) ×3 IMPLANT
CANISTER SUCT 3000ML (MISCELLANEOUS) ×6 IMPLANT
CAPT HIP TOTAL 2 ×2 IMPLANT
CARTRIDGE OIL MAESTRO DRILL (MISCELLANEOUS) ×1 IMPLANT
CATH FOL LEG HOLDER (MISCELLANEOUS) ×3 IMPLANT
CATH TRAY METER 16FR LF (MISCELLANEOUS) ×3 IMPLANT
DIFFUSER MAESTRO (MISCELLANEOUS) ×3 IMPLANT
DRAPE INCISE IOBAN 66X60 STRL (DRAPES) ×3 IMPLANT
DRAPE SHEET LG 3/4 BI-LAMINATE (DRAPES) ×3 IMPLANT
DRAPE TABLE BACK 80X90 (DRAPES) ×3 IMPLANT
DRSG DERMACEA 8X12 NADH (GAUZE/BANDAGES/DRESSINGS) ×3 IMPLANT
DRSG OPSITE POSTOP 3X4 (GAUZE/BANDAGES/DRESSINGS) ×3 IMPLANT
DRSG OPSITE POSTOP 4X12 (GAUZE/BANDAGES/DRESSINGS) ×3 IMPLANT
DRSG OPSITE POSTOP 4X14 (GAUZE/BANDAGES/DRESSINGS) ×3 IMPLANT
DRSG TEGADERM 4X4.75 (GAUZE/BANDAGES/DRESSINGS) ×3 IMPLANT
DURAPREP 26ML APPLICATOR (WOUND CARE) ×3 IMPLANT
ELECT BLADE 6.5 EXT (BLADE) ×3 IMPLANT
ELECT CAUTERY BLADE 6.4 (BLADE) ×3 IMPLANT
GLOVE BIOGEL M STRL SZ7.5 (GLOVE) ×3 IMPLANT
GLOVE INDICATOR 8.0 STRL GRN (GLOVE) ×3 IMPLANT
GLOVE SURG 9.0 ORTHO LTXF (GLOVE) ×3 IMPLANT
GLOVE SURG ORTHO 9.0 STRL STRW (GLOVE) ×3 IMPLANT
GOWN STRL REUS W/ TWL LRG LVL3 (GOWN DISPOSABLE) ×1 IMPLANT
GOWN STRL REUS W/ TWL LRG LVL4 (GOWN DISPOSABLE) ×1 IMPLANT
GOWN STRL REUS W/TWL 2XL LVL3 (GOWN DISPOSABLE) ×3 IMPLANT
GOWN STRL REUS W/TWL LRG LVL3 (GOWN DISPOSABLE) ×3
GOWN STRL REUS W/TWL LRG LVL4 (GOWN DISPOSABLE) ×3
HANDPIECE SUCTION TUBG SURGILV (MISCELLANEOUS) ×3 IMPLANT
HEMOVAC 400CC 10FR (MISCELLANEOUS) ×3 IMPLANT
HOOD PEEL AWAY FLYTE STAYCOOL (MISCELLANEOUS) ×6 IMPLANT
KIT RM TURNOVER STRD PROC AR (KITS) ×3 IMPLANT
NDL SAFETY 18GX1.5 (NEEDLE) ×3 IMPLANT
NS IRRIG 1000ML POUR BTL (IV SOLUTION) ×3 IMPLANT
OIL CARTRIDGE MAESTRO DRILL (MISCELLANEOUS) ×3
PACK HIP PROSTHESIS (MISCELLANEOUS) ×3 IMPLANT
PIN STEIN THRED 5/32 (Pin) ×3 IMPLANT
SOL .9 NS 3000ML IRR  AL (IV SOLUTION) ×2
SOL .9 NS 3000ML IRR AL (IV SOLUTION) ×1
SOL .9 NS 3000ML IRR UROMATIC (IV SOLUTION) ×1 IMPLANT
SOL PREP PVP 2OZ (MISCELLANEOUS) ×3
SOLUTION PREP PVP 2OZ (MISCELLANEOUS) ×1 IMPLANT
SPONGE DRAIN TRACH 4X4 STRL 2S (GAUZE/BANDAGES/DRESSINGS) ×3 IMPLANT
STAPLER SKIN PROX 35W (STAPLE) ×3 IMPLANT
SUT ETHIBOND #5 BRAIDED 30INL (SUTURE) ×3 IMPLANT
SUT VIC AB 0 CT1 36 (SUTURE) ×3 IMPLANT
SUT VIC AB 1 CT1 36 (SUTURE) ×6 IMPLANT
SUT VIC AB 2-0 CT1 27 (SUTURE) ×3
SUT VIC AB 2-0 CT1 TAPERPNT 27 (SUTURE) ×1 IMPLANT
SYR 20CC LL (SYRINGE) ×3 IMPLANT
TAPE ADH 3 LX (MISCELLANEOUS) ×3 IMPLANT
TAPE TRANSPORE STRL 2 31045 (GAUZE/BANDAGES/DRESSINGS) ×3 IMPLANT
WATER STERILE IRR 1000ML POUR (IV SOLUTION) ×6 IMPLANT

## 2015-06-14 NOTE — Transfer of Care (Signed)
Immediate Anesthesia Transfer of Care Note  Patient: Claudia Terry  Procedure(s) Performed: Procedure(s): TOTAL HIP ARTHROPLASTY (Right)  Patient Location: PACU  Anesthesia Type:General  Level of Consciousness: awake, alert  and oriented  Airway & Oxygen Therapy: Patient Spontanous Breathing and Patient connected to face mask oxygen  Post-op Assessment: Report given to RN and Post -op Vital signs reviewed and stable  Post vital signs: Reviewed and stable  Last Vitals:  Filed Vitals:   06/14/15 0612 06/14/15 1051  BP: 150/80   Pulse: 79   Temp: 36.7 C 36.2 C  Resp: 20     Complications: No apparent anesthesia complications

## 2015-06-14 NOTE — Care Management (Deleted)
Patient to discharge to [facility] . RNCM signing off.  CSW following

## 2015-06-14 NOTE — Brief Op Note (Signed)
06/14/2015  10:47 AM  PATIENT:  Claudia Terry  70 y.o. female  PRE-OPERATIVE DIAGNOSIS:  degenerative osteoarthritis of the right hip  POST-OPERATIVE DIAGNOSIS:  Same  PROCEDURE:  Procedure(s): TOTAL HIP ARTHROPLASTY (Right)  SURGEON:  Surgeon(s) and Role:    * Dereck Leep, MD - Primary  ASSISTANTS: Rachelle Hora, PA-C   ANESTHESIA:   general  EBL:  Total I/O In: 1000 [I.V.:1000] Out: 450 [Urine:200; Blood:250]  BLOOD ADMINISTERED:none  DRAINS: 2 medium Hemovac   LOCAL MEDICATIONS USED:  NONE  SPECIMEN:  Source of Specimen:  Right femoral head  DISPOSITION OF SPECIMEN:  PATHOLOGY  COUNTS:  YES  TOURNIQUET:  * No tourniquets in log *  DICTATION: .Dragon Dictation  PLAN OF CARE: Admit to inpatient   PATIENT DISPOSITION:  PACU - hemodynamically stable.   Delay start of Pharmacological VTE agent (>24hrs) due to surgical blood loss or risk of bleeding: yes

## 2015-06-14 NOTE — Evaluation (Signed)
Physical Therapy Evaluation Patient Details Name: Claudia Terry MRN: [id number] DOB: [birth year] Today's Date: 06/14/2015   History of Present Illness  Pt underwent R THR posterior approach. No reported post-op complications. Pt denies falls in the last 12 months.   Clinical Impression  Pt is very limited by pain at this time even though PT session coordinated with RN for pain med administration. Pt only tolerates minimal movement of RLE during bed exercises. She cries throughout most of bed exercises and reports "15/10" pain with activity. Pt refuses to attempt bed mobility, transfers, or ambulation with PT at this time. MD paged and notified. Pt refuses to dangle at EOB. Pt is very limited by pain at this time. Will continue to attempt to coordinate PT sessions with pain medication administration. Pt would like to discharge home with [facility] PT and will work toward that goal. Currently PT will have to recommend SNF placement due to inability to witness safe bed mobility, transfers, and ambulation.l Pt will benefit from skilled PT services to address deficits in strength, balance, and mobility in order to return to full function at home.     Follow Up Recommendations SNF (Pt hopes to discharge home with [facility] PT)    Equipment Recommendations  Rolling walker with 5" wheels (Pt has rollator but not two-wheeled walker)    Recommendations for Other Services       Precautions / Restrictions Precautions Precautions: Fall Restrictions Weight Bearing Restrictions: Yes RLE Weight Bearing: Weight bearing as tolerated      Mobility  Bed Mobility               General bed mobility comments: Pt refuses to perform with therapist  Transfers                 General transfer comment: Pt refuses to perform with therapist  Ambulation/Gait             General Gait Details: Pt refuses to perform with therapist  Stairs            Wheelchair Mobility    Modified Rankin (Stroke  Patients Only)       Balance                                             Pertinent Vitals/Pain Pain Assessment: 0-10 Pain Score: 10-Worst pain ever ("15/10 pain") Pain Location: R hip Pain Intervention(s): Monitored during session;Patient requesting pain meds-RN notified;Limited activity within patient's tolerance (MD notified)    Home Living Family/patient expects to be discharged to:: Private residence Living Arrangements: Spouse/significant other;Other relatives (Elderly mother) Available Help at Discharge: Family Type of Home: House Home Access: Stairs to enter Entrance Stairs-Rails: Can reach both [employer] are fairly wide) Technical brewer of Steps: 4 Home Layout: One level Home Equipment: Environmental consultant - 4 wheels;Cane - single point;Shower seat;Grab bars - tub/shower (no BSC, no wheelchair, no two-wheeled walker)      Prior Function Level of Independence: Independent with assistive device(s)         Comments: Intermittent use of spc vs rollator for limited community ambulation     Hand Dominance   Dominant Hand: Right    Extremity/Trunk Assessment   Upper Extremity Assessment: Overall WFL for tasks assessed           Lower Extremity Assessment: RLE deficits/detail RLE Deficits / Details: Pt severely painful with  all attempts at mobility of RLE. Unable to perform SLR. Pt denies numbness/tingling to light touch of RLE. Full DF/PF strength noted. Pt unable to perform R SAQ without assist. Pt appears to have full strength in LLE with bed exercises       Communication   Communication: No difficulties  Cognition Arousal/Alertness: Awake/alert Behavior During Therapy: WFL for tasks assessed/performed Overall Cognitive Status: Within Functional Limits for tasks assessed                      General Comments      Exercises Total Joint Exercises Ankle Circles/Pumps: Strengthening;Right;5 reps;Supine (LLE x 10 reps) Quad Sets:  Strengthening;Both;10 reps;Supine Gluteal Sets: Strengthening;Both;10 reps;Supine Towel Squeeze: Strengthening;Both;10 reps;Supine Short Arc Quad: Strengthening;Right;5 reps;Supine (LLE x 10 reps) Hip ABduction/ADduction: Strengthening;Right;5 reps;Supine (LLE x 10 reps) Straight Leg Raises: Strengthening;Right;5 reps;Supine (LLE x 10 reps)      Assessment/Plan    PT Assessment Patient needs continued PT services  PT Diagnosis Difficulty walking;Generalized weakness;Acute pain   PT Problem List Decreased strength;Decreased activity tolerance;Decreased range of motion;Decreased mobility;Pain;Obesity  PT Treatment Interventions DME instruction;Gait training;Stair training;Therapeutic activities;Therapeutic exercise;Balance training;Neuromuscular re-education;Patient/family education;Manual techniques   PT Goals (Current goals can be found in the Care Plan section) Acute Rehab PT Goals Patient Stated Goal: "I want to try to get back home." PT Goal Formulation: With patient/family Time For Goal Achievement: 06/28/15 Potential to Achieve Goals: Good    Frequency BID   Barriers to discharge Inaccessible home environment 4 stairs to enter    Co-evaluation               End of Session Equipment Utilized During Treatment: Oxygen Activity Tolerance: Patient limited by pain Patient left: in bed;with call bell/phone within reach;with bed alarm set;with family/visitor present;with SCD's reapplied Nurse Communication: Mobility status;Other (comment) (Request for pain meds)         Time: [id number] PT Time Calculation (min) (ACUTE ONLY): 25 min   Charges:   PT Evaluation $PT Eval Moderate Complexity: 1 Procedure PT Treatments $Therapeutic Exercise: 8-22 mins   PT G Codes:       Lyndel Safe Sarah Baez PT, DPT    Sargent Mankey 06/14/2015, 5:02 PM

## 2015-06-14 NOTE — Progress Notes (Signed)
Pt is calm and relaxed when I am not working with her.  As soon as I touch her she maons, cries out.  I having been given pain med as scheduled.  Pt was not able to work with PT and would not try sitting on the side of the bed

## 2015-06-14 NOTE — H&P (Signed)
The patient has been re-examined, and the chart reviewed, and there have been no interval changes to the documented history and physical.    The risks, benefits, and alternatives have been discussed at length. The patient expressed understanding of the risks benefits and agreed with plans for surgical intervention.  Shalev Helminiak P. Anaise Sterbenz, Jr. M.D.    

## 2015-06-14 NOTE — Anesthesia Postprocedure Evaluation (Signed)
Anesthesia Post Note  Patient: Claudia Terry  Procedure(s) Performed: Procedure(s) (LRB): TOTAL HIP ARTHROPLASTY (Right)  Patient location during evaluation: PACU Anesthesia Type: General Level of consciousness: awake and alert Pain management: pain level controlled Vital Signs Assessment: post-procedure vital signs reviewed and stable Respiratory status: spontaneous breathing, nonlabored ventilation, respiratory function stable and patient connected to nasal cannula oxygen Cardiovascular status: blood pressure returned to baseline and stable Postop Assessment: no signs of nausea or vomiting Anesthetic complications: no Comments: Patient was initially complaining of 20/10 pain on arrival to the PACU, but received medications including dilaudid and fentanyl and was feeling much improved on discharge from PACU.    Last Vitals:  Filed Vitals:   06/14/15 1326 06/14/15 1427  BP: 120/71 131/67  Pulse: 66 69  Temp: 35.8 C 36.3 C  Resp: 17 17    Last Pain:  Filed Vitals:   06/14/15 1451  PainSc: 5                  Martha Clan

## 2015-06-14 NOTE — Anesthesia Preprocedure Evaluation (Signed)
Anesthesia Evaluation  Patient identified by MRN, date of birth, ID band Patient awake    Reviewed: Allergy & Precautions, NPO status , Patient's Chart, lab work & pertinent test results  Airway Mallampati: II       Dental no notable dental hx.    Pulmonary neg pulmonary ROS,    Pulmonary exam normal        Cardiovascular Exercise Tolerance: Good hypertension,  Rhythm:Regular     Neuro/Psych    GI/Hepatic Neg liver ROS, GERD  ,  Endo/Other  negative endocrine ROS  Renal/GU negative Renal ROS     Musculoskeletal negative musculoskeletal ROS (+)   Abdominal Normal abdominal exam  (+)   Peds  Hematology negative hematology ROS (+)   Anesthesia Other Findings   Reproductive/Obstetrics                             Anesthesia Physical Anesthesia Plan  ASA: II  Anesthesia Plan: General   Post-op Pain Management:    Induction: Intravenous  Airway Management Planned: Oral ETT  Additional Equipment:   Intra-op Plan:   Post-operative Plan: Extubation in OR  Informed Consent: I have reviewed the patients History and Physical, chart, labs and discussed the procedure including the risks, benefits and alternatives for the proposed anesthesia with the patient or authorized representative who has indicated his/her understanding and acceptance.     Plan Discussed with: CRNA  Anesthesia Plan Comments:         Anesthesia Quick Evaluation

## 2015-06-14 NOTE — Op Note (Signed)
OPERATIVE NOTE  DATE OF SURGERY:  06/14/2015  PATIENT NAME:  Claudia Terry   DOB: Aug 22, 1945  MRN: [id number]  PRE-OPERATIVE DIAGNOSIS: Degenerative arthrosis of the right hip, primary  POST-OPERATIVE DIAGNOSIS:  Same  PROCEDURE:  Right total hip arthroplasty  SURGEON:  Marciano Sequin. M.D.  ASSISTANT:  Rachelle Hora, PA-C (present and scrubbed throughout the case, critical for assistance with exposure, retraction, instrumentation, and closure)  ANESTHESIA: general  ESTIMATED BLOOD LOSS: 250 mL  FLUIDS REPLACED: 1500 mL of crystalloid  DRAINS: 2 medium drains to a Hemovac reservoir  IMPLANTS UTILIZED: DePuy 12 mm small stature AML femoral stem, 54 mm OD Pinnacle 100 acetabular component, +4 mm 10 Pinnacle Marathon polyethylene insert, and a 36 mm M-SPEC +1.5 mm hip ball  INDICATIONS FOR SURGERY: Claudia Terry is a 70 y.o. year old female with a long history of progressive hip and groin  pain. X-rays demonstrated severe degenerative changes. The patient had not seen any significant improvement despite conservative nonsurgical intervention. After discussion of the risks and benefits of surgical intervention, the patient expressed understanding of the risks benefits and agree with plans for total hip arthroplasty.   The risks, benefits, and alternatives were discussed at length including but not limited to the risks of infection, bleeding, nerve injury, stiffness, blood clots, the need for revision surgery, limb length inequality, dislocation, cardiopulmonary complications, among others, and they were willing to proceed.  PROCEDURE IN DETAIL: The patient was brought into the operating room and, after adequate general anesthesia was achieved, the patient was placed in a left lateral decubitus position. Axillary roll was placed and all bony prominences were well-padded. The patient's right hip was cleaned and prepped with alcohol and DuraPrep and draped in the usual sterile fashion. A  "timeout" was performed as per usual protocol. A lateral curvilinear incision was made gently curving towards the posterior superior iliac spine. The IT band was incised in line with the skin incision and the fibers of the gluteus maximus were split in line. The piriformis tendon was identified, skeletonized, and incised at its insertion to the proximal femur and reflected posteriorly. A T type posterior capsulotomy was performed. Prior to dislocation of the femoral head, a threaded Steinmann pin was inserted through a separate stab incision into the pelvis superior to the acetabulum and bent in the form of a stylus so as to assess limb length and hip offset throughout the procedure. The femoral head was then dislocated posteriorly. Inspection of the femoral head demonstrated severe degenerative changes with full-thickness loss of articular cartilage. The femoral neck cut was performed using an oscillating saw. The anterior capsule was elevated off of the femoral neck using a periosteal elevator. Attention was then directed to the acetabulum. The remnant of the labrum was excised using electrocautery. Inspection of the acetabulum also demonstrated significant degenerative changes. The acetabulum was reamed in sequential fashion up to a 53 mm diameter. Good punctate bleeding bone was encountered. A 54 mm Pinnacle 100 acetabular component was positioned and impacted into place. Good scratch fit was appreciated. A +4 mm neutral polyethylene trial was inserted.  Attention was then directed to the proximal femur. A hole for reaming of the proximal femoral canal was created using a high-speed burr. The femoral canal was reamed in sequential fashion up to a 11.5 mm diameter. This allowed for approximately 7 cm of scratch fit. It was thus elected to ream up to a 12 mm diameter to allow for a line to line  fit. Serial broaches were inserted up to a 12 mm small stature femoral broach. Calcar region was planed and a trial  reduction was performed using a 36 mm hip ball with a +1.5 mm neck length. Good stability was noted with the neutral liner. A +4 mm 10 trial liner was placed with the high side at the 8:00 position and reduction was again performed. Good equalization of limb lengths and hip offset was appreciated and excellent stability was noted both anteriorly and posteriorly. Trial components were removed. The acetabular shell was irrigated with copious amounts of normal saline with antibiotic solution and suctioned dry. A +4 mm 10 Pinnacle Marathon polyethylene insert was positioned with the high side at the 8:00 position and impacted into place. Next, a 12 mm small stature AML femoral stem was positioned and impacted into place. Excellent scratch fit was appreciated. A trial reduction was again performed with a 36 mm hip ball with a +1.5 mm neck length. Again, good equalization of limb lengths was appreciated and excellent stability appreciated both anteriorly and posteriorly. The hip was then dislocated and the trial hip ball was removed. The Morse taper was cleaned and dried. A 36 mm M-SPEC hip ball with a +1.5 mm neck length was placed on the trunnion and impacted into place. The hip was then reduced and placed through range of motion. Excellent stability was appreciated both anteriorly and posteriorly.  The wound was irrigated with copious amounts of normal saline with antibiotic solution and suctioned dry. Good hemostasis was appreciated. The posterior capsulotomy was repaired using #5 Ethibond. Piriformis tendon was reapproximated to the undersurface of the gluteus medius tendon using #5 Ethibond. Two medium drains were placed in the wound bed and brought out through separate stab incisions to be attached to a Hemovac reservoir. The IT band was reapproximated using interrupted sutures of #1 Vicryl. Subcutaneous tissue was approximated using first #0 Vicryl followed by #2-0 Vicryl. The skin was closed with skin  staples.  The patient tolerated the procedure well and was transported to the recovery room in stable condition.   Marciano Sequin., M.D.

## 2015-06-14 NOTE — Progress Notes (Signed)
Patient was able to sit on the side of the bed then stand up by the bed using the walker.  She did not ambulate.  She is still slightly groggy post surgery

## 2015-06-14 NOTE — Anesthesia Procedure Notes (Signed)
Procedure Name: Intubation Date/Time: 06/14/2015 7:25 AM Performed by: Kennon Holter Pre-anesthesia Checklist: Timeout performed, Patient being monitored, Suction available, Emergency Drugs available and Patient identified Patient Re-evaluated:Patient Re-evaluated prior to inductionOxygen Delivery Method: Circle system utilized Intubation Type: IV induction Ventilation: Mask ventilation without difficulty Laryngoscope Size: Miller and 2 Grade View: Grade I Tube type: Oral Tube size: 7.0 mm Airway Equipment and Method: Stylet Placement Confirmation: ETT inserted through vocal cords under direct vision,  positive ETCO2 and breath sounds checked- equal and bilateral Secured at: 20 cm Dental Injury: Teeth and Oropharynx as per pre-operative assessment

## 2015-06-15 LAB — CBC
HCT: 35 % (ref 35.0–47.0)
Hemoglobin: 11.8 g/dL — ABNORMAL LOW (ref 12.0–16.0)
MCH: 31.3 pg (ref 26.0–34.0)
MCHC: 33.8 g/dL (ref 32.0–36.0)
MCV: 92.8 fL (ref 80.0–100.0)
Platelets: 194 10*3/uL (ref 150–440)
RBC: 3.77 MIL/uL — ABNORMAL LOW (ref 3.80–5.20)
RDW: 12.1 % (ref 11.5–14.5)
WBC: 11.9 10*3/uL — ABNORMAL HIGH (ref 3.6–11.0)

## 2015-06-15 LAB — BASIC METABOLIC PANEL
Anion gap: 2 — ABNORMAL LOW (ref 5–15)
BUN: 10 mg/dL (ref 6–20)
CO2: 28 mmol/L (ref 22–32)
Calcium: 8.5 mg/dL — ABNORMAL LOW (ref 8.9–10.3)
Chloride: 107 mmol/L (ref 101–111)
Creatinine, Ser: 0.66 mg/dL (ref 0.44–1.00)
GFR calc Af Amer: >60 mL/min (ref >60)
GFR calc non Af Amer: >60 mL/min (ref >60)
Glucose, Bld: 114 mg/dL — ABNORMAL HIGH (ref 65–99)
Potassium: 3.3 mmol/L — ABNORMAL LOW (ref 3.5–5.1)
Sodium: 137 mmol/L (ref 135–145)

## 2015-06-15 MED ORDER — PRAMIPEXOLE DIHYDROCHLORIDE 0.25 MG PO TABS
0.5000 mg | ORAL_TABLET | Freq: Once | ORAL | Status: AC
  Administered 2015-06-15: 0.5 mg via ORAL
  Filled 2015-06-15: qty 2

## 2015-06-15 MED ORDER — PRAMIPEXOLE DIHYDROCHLORIDE 0.25 MG PO TABS
0.5000 mg | ORAL_TABLET | Freq: Two times a day (BID) | ORAL | Status: DC
  Administered 2015-06-15 – 2015-06-16 (×3): 0.5 mg via ORAL
  Filled 2015-06-15 (×3): qty 2

## 2015-06-15 MED ORDER — CELECOXIB 200 MG PO CAPS
200.0000 mg | ORAL_CAPSULE | Freq: Two times a day (BID) | ORAL | Status: DC
  Administered 2015-06-15 – 2015-06-16 (×3): 200 mg via ORAL
  Filled 2015-06-15 (×3): qty 1

## 2015-06-15 NOTE — Progress Notes (Signed)
Spoke with Dr. Roland Rack. Order for one time dose of Mirapex given.

## 2015-06-15 NOTE — Progress Notes (Signed)
2 RNs unable to restart IV. AC called to room to start IV.

## 2015-06-15 NOTE — Care Management (Signed)
Patient participating with PT.  Will assess at a later time

## 2015-06-15 NOTE — Care Management (Signed)
Attempt to complete assessment.  Patient receiving patient care. Will assess at a later time

## 2015-06-15 NOTE — Progress Notes (Signed)
Physical Therapy Treatment Patient Details Name: Claudia Terry MRN: [id number] DOB: 04/23/1946 Today's Date: 06/15/2015    History of Present Illness Pt underwent R THR posterior approach. No reported post-op complications. Pt denies falls in the last 12 months.     PT Comments    Pt awake, but fatigued this afternoon. Pt notes she was able to remain up in chair for 2 hours this morning. Pt continues pain in right hip/thigh with sharp pain in thigh. Slow to move with all functional mobility and requires re education/cueing for adherence to posterior hip precautions. Encouraged to use left upper extremity to push from and reach for bed/chair with sit to/from stand. Pt demonstrates improved distance with ambulation; pt also better clearing feet although right foot just barely clears floor. Pt wished back to bed post session for rest. Continue PT for progression of range of motion, strength, and all functional mobility.   Follow Up Recommendations  SNF     Equipment Recommendations  Rolling walker with 5" wheels    Recommendations for Other Services       Precautions / Restrictions Precautions Precautions: Fall Precaution Comments: posterior hip Restrictions Weight Bearing Restrictions: Yes RLE Weight Bearing: Weight bearing as tolerated Other Position/Activity Restrictions: posterior hip precautions    Mobility  Bed Mobility Overal bed mobility: Needs Assistance Bed Mobility: Supine to Sit;Sit to Supine     Supine to sit: Min assist Sit to supine: Min assist   General bed mobility comments: Min A for LEs; cues for posterior hip precautions  Transfers Overall transfer level: Needs assistance Equipment used: Rolling walker (2 wheeled) Transfers: Sit to/from Stand Sit to Stand: Min guard         General transfer comment: slow to rise; cues for posterior hip precautions  Ambulation/Gait Ambulation/Gait assistance: Min guard Ambulation Distance (Feet): 40  Feet Assistive device: Rolling walker (2 wheeled) Gait Pattern/deviations: Step-to pattern;Decreased stance time - right;Decreased weight shift to right;Decreased dorsiflexion - right;Decreased dorsiflexion - left (decreased clearance R foot) Gait velocity: very slow Gait velocity interpretation: <1.8 ft/sec, indicative of risk for recurrent falls General Gait Details: improved clearance of B feet although R foot barely clears; heavy lean on UEs    Stairs            Wheelchair Mobility    Modified Rankin (Stroke Patients Only)       Balance Overall balance assessment: Needs assistance Sitting-balance support: Bilateral upper extremity supported Sitting balance-Leahy Scale: Good     Standing balance support: Bilateral upper extremity supported Standing balance-Leahy Scale: Fair                      Cognition Arousal/Alertness: Awake/alert (fatigued) Behavior During Therapy: WFL for tasks assessed/performed Overall Cognitive Status: Within Functional Limits for tasks assessed       Memory: Decreased recall of precautions              Exercises Total Joint Exercises Ankle Circles/Pumps: AROM;Both;20 reps;Supine Quad Sets: Strengthening;Both;20 reps;Supine Gluteal Sets: Strengthening;Both;20 reps;Supine Towel Squeeze: Strengthening;Both;20 reps;Supine Short Arc Quad: AROM;Right;20 reps;Supine Heel Slides: AAROM;Right;20 reps;Supine Hip ABduction/ADduction: AAROM;Right;20 reps;Supine Straight Leg Raises: AAROM;Right;10 reps;Supine Other Exercises Other Exercises: Re educated on posterior hip precautions with transfers, bed mobiltiy and ambulation    General Comments        Pertinent Vitals/Pain Pain Assessment: 0-10 Pain Score: 7  Pain Location: R hip/thigh Pain Descriptors / Indicators: Aching;Sharp Pain Intervention(s): Monitored during session;Premedicated before session    Home Living  Prior Function             PT Goals (current goals can now be found in the care plan section) Progress towards PT goals: Progressing toward goals    Frequency  BID    PT Plan Current plan remains appropriate    Co-evaluation             End of Session Equipment Utilized During Treatment: Gait belt Activity Tolerance: No increased pain;Patient limited by fatigue Patient left: in bed;with call bell/phone within reach;with bed alarm set;with SCD's reapplied     Time: [id number] PT Time Calculation (min) (ACUTE ONLY): 28 min  Charges:  $Gait Training: 8-22 mins $Therapeutic Exercise: 8-22 mins $Therapeutic Activity: 8-22 mins                    G Codes:      Charlaine Dalton 06/15/2015, 3:13 PM

## 2015-06-15 NOTE — Clinical Social Work Note (Signed)
Clinical Education officer, museum received consult for New SNF. CSW attempted to see pt, however she was working with PTA. CSW attempted again, but pt was sleeping soundly. Full assessment to follow. CSW will continue to follow.   Darden Dates, MSW, LCSW Clinical Social Worker  [phone]

## 2015-06-15 NOTE — NC FL2 (Signed)
[place] LEVEL OF CARE SCREENING TOOL     IDENTIFICATION  Patient Name: Claudia Terry Birthdate: [birth year] Sex: female Admission Date (Current Location): 06/14/2015  [place] and [place] Number:  Engineering geologist and Address:  [facility], [address] [place], [place] [address]      Provider Number: [id number]  Attending Physician Name and Address:  Dereck Leep, MD  Relative Name and Phone Number:       Current Level of Care: Hospital Recommended Level of Care: [place] Prior Approval Number:    Date Approved/Denied:   PASRR Number:  ([id number])  Discharge Plan: SNF    Current Diagnoses: Patient Active Problem List   Diagnosis Date Noted  . Primary osteoarthritis of right hip 06/14/2015  . S/P total hip arthroplasty 06/14/2015    Orientation RESPIRATION BLADDER Height & Weight    Self, Time, Situation  Normal Continent 5\' 4"  (162.6 cm) 188 lbs.  BEHAVIORAL SYMPTOMS/MOOD NEUROLOGICAL BOWEL NUTRITION STATUS   (None)  (None) Continent Diet (Regular)  AMBULATORY STATUS COMMUNICATION OF NEEDS Skin   Extensive Assist Verbally Other (Comment) (Incision (Closed) Right Himp & Closed system drain, accordion right hip)                       Personal Care Assistance Level of Assistance  Bathing, Feeding, Dressing Bathing Assistance: Limited assistance Feeding assistance: Independent Dressing Assistance: Limited assistance     Functional Limitations Info  Sight, Hearing, Speech Sight Info: Adequate Hearing Info: Adequate Speech Info: Adequate    SPECIAL CARE FACTORS FREQUENCY  PT (By licensed PT)     PT Frequency:  (5)              Contractures      Additional Factors Info  Code Status, Allergies Code Status Info:  (Full Code) Allergies Info:  (Erythromycin, Penicillins, Tetracyclines & Related)           Current Medications (06/15/2015):  This is the current hospital active  medication list Current Facility-Administered Medications  Medication Dose Route Frequency Provider Last Rate Last Dose  . 0.9 %  sodium chloride infusion   Intravenous Continuous Dereck Leep, MD 100 mL/hr at 06/15/15 0012    . acetaminophen (TYLENOL) tablet 650 mg  650 mg Oral Q6H PRN Dereck Leep, MD       Or  . acetaminophen (TYLENOL) suppository 650 mg  650 mg Rectal Q6H PRN Dereck Leep, MD      . alum & mag hydroxide-simeth (MAALOX/MYLANTA) 200-200-20 MG/5ML suspension 30 mL  30 mL Oral Q4H PRN Dereck Leep, MD      . atenolol (TENORMIN) tablet 50 mg  50 mg Oral Daily Dereck Leep, MD   50 mg at 06/15/15 0756  . bisacodyl (DULCOLAX) suppository 10 mg  10 mg Rectal Daily PRN Dereck Leep, MD      . celecoxib (CELEBREX) capsule 200 mg  200 mg Oral BID Dereck Leep, MD   200 mg at 06/15/15 0754  . cholecalciferol (VITAMIN D) tablet 5,000 Units  5,000 Units Oral Daily Dereck Leep, MD   5,000 Units at 06/15/15 0754  . diphenhydrAMINE (BENADRYL) 12.5 MG/5ML elixir 12.5-25 mg  12.5-25 mg Oral Q4H PRN Dereck Leep, MD      . enoxaparin (LOVENOX) injection 30 mg  30 mg Subcutaneous Q12H Dereck Leep, MD   30 mg at 06/15/15 0753  . ferrous  sulfate tablet 325 mg  325 mg Oral BID WC Dereck Leep, MD   325 mg at 06/15/15 0755  . loratadine (CLARITIN) tablet 5 mg  5 mg Oral Q12H Dereck Leep, MD   5 mg at 06/15/15 0754   And  . pseudoephedrine (SUDAFED) 12 hr tablet 120 mg  120 mg Oral BID Dereck Leep, MD   120 mg at 06/15/15 0755  . LORazepam (ATIVAN) tablet 1 mg  1 mg Oral Q6H PRN Corky Mull, MD   1 mg at 06/15/15 0756  . magnesium hydroxide (MILK OF MAGNESIA) suspension 30 mL  30 mL Oral Daily PRN Dereck Leep, MD   30 mL at 06/14/15 2109  . menthol-cetylpyridinium (CEPACOL) lozenge 3 mg  1 lozenge Oral PRN Dereck Leep, MD       Or  . phenol (CHLORASEPTIC) mouth spray 1 spray  1 spray Mouth/Throat PRN Dereck Leep, MD      . metoCLOPramide (REGLAN) tablet 10 mg   10 mg Oral TID AC & HS Dereck Leep, MD   10 mg at 06/15/15 1145  . morphine 2 MG/ML injection 2-4 mg  2-4 mg Intravenous Q2H PRN Corky Mull, MD   4 mg at 06/14/15 2304  . multivitamin with minerals tablet 1 tablet  1 tablet Oral BID Dereck Leep, MD   1 tablet at 06/15/15 0755  . ondansetron (ZOFRAN) tablet 4 mg  4 mg Oral Q6H PRN Dereck Leep, MD       Or  . ondansetron (ZOFRAN) injection 4 mg  4 mg Intravenous Q6H PRN Dereck Leep, MD      . oxyCODONE (Oxy IR/ROXICODONE) immediate release tablet 5-10 mg  5-10 mg Oral Q3H PRN Corky Mull, MD   10 mg at 06/15/15 1407  . pramipexole (MIRAPEX) tablet 0.5 mg  0.5 mg Oral BID Dereck Leep, MD   0.5 mg at 06/15/15 0755  . senna-docusate (Senokot-S) tablet 1 tablet  1 tablet Oral BID Dereck Leep, MD   1 tablet at 06/15/15 0755  . sodium phosphate (FLEET) 7-19 GM/118ML enema 1 enema  1 enema Rectal Once PRN Dereck Leep, MD      . traMADol Veatrice Bourbon) tablet 50-100 mg  50-100 mg Oral Q4H PRN Dereck Leep, MD   100 mg at 06/15/15 1145  . vitamin C (ASCORBIC ACID) tablet 1,000 mg  1,000 mg Oral Daily Dereck Leep, MD   1,000 mg at 06/15/15 [id number]     Discharge Medications: Please see discharge summary for a list of discharge medications.  Relevant Imaging Results:  Relevant Lab Results:   Additional Information  (SSN: [id number])  Lorenso Quarry Trystin Hargrove, LCSW

## 2015-06-15 NOTE — Progress Notes (Signed)
Notified Dr. Roland Rack of pt uncontrolled pain. Pt in room crying out and moaning. orders received.

## 2015-06-15 NOTE — Evaluation (Signed)
Occupational Therapy Evaluation Patient Details Name: SAMYIAH HALVORSEN MRN: [id number] DOB: [birth year] Today's Date: 06/15/2015    History of Present Illness Pt underwent R THR posterior approach on 06/14/2015.   Clinical Impression   This patient is a 70 year old female who came to [facility] for a R total hip replacement (posterior approach).  Patient lives in a one story home with 4 steps to enter and a rail.  She had been independent with ADL and functional mobility with occasional single point cane use. She now requires assistance.  She shows deficits with pain, mobility, and activities of daily living and would benefit from Occupational Therapy for ADL/functional mobility training while .staying within hip precautions (posterior approach).      Follow Up Recommendations  SNF (at this time) home if improves.   Equipment Recommendations       Recommendations for Other Services       Precautions / Restrictions Precautions Precautions: Fall Restrictions Weight Bearing Restrictions: Yes RLE Weight Bearing: Weight bearing as tolerated      Mobility Bed Mobility                  Transfers                      Balance                                            ADL                                         General ADL Comments: Patient had been independent with occasional use of single point cane. Today patient requires assist. Practiced techniques to dress lower body while staying within hip precautions (posterior approach). Practiced techniques to Donned/doffed socks and pants to knees (drain still in place). Patient required minimal assist with this limited activity and physical and verbal cues for technique and safety. Patient used hip kit and given list of vendors who carry hip kit.       Vision     Perception     Praxis      Pertinent Vitals/Pain Pain Score:  (Patient reports it has come  down to a 10 - Has recieved pain meds.) Pain Location: R hip     Hand Dominance Right   Extremity/Trunk Assessment Upper Extremity Assessment Upper Extremity Assessment: Overall WFL for tasks assessed   Lower Extremity Assessment Lower Extremity Assessment: Defer to PT evaluation       Communication Communication Communication: No difficulties   Cognition Arousal/Alertness: Awake/alert Behavior During Therapy: WFL for tasks assessed/performed Overall Cognitive Status: Within Functional Limits for tasks assessed                     General Comments       Exercises       Shoulder Instructions      Home Living Family/patient expects to be discharged to:: Private residence Living Arrangements: Spouse/significant other;Other relatives (and mother) Available Help at Discharge: Family Type of Home: House Home Access: Stairs to enter [employer] of Steps: 4 Entrance Stairs-Rails: Can reach both Home Layout: One level     Bathroom Shower/Tub: Tub/shower unit  Home Equipment: [place] - 4 wheels;Cane - single point;Shower seat;Grab bars - tub/shower          Prior Functioning/Environment Level of Independence: Independent with assistive device(s)        Comments: single point cane at times    OT Diagnosis: Acute pain   OT Problem List: Decreased range of motion;Decreased activity tolerance;Decreased knowledge of use of DME or AE;Decreased knowledge of precautions;Pain (Patient named 2 of 3 hip precautions)   OT Treatment/Interventions: Self-care/ADL training    OT Goals(Current goals can be found in the care plan section) Acute Rehab OT Goals Patient Stated Goal: Would like to go home OT Goal Formulation: With patient Time For Goal Achievement: 06/29/15 Potential to Achieve Goals: Good  OT Frequency: Min 1X/week   Barriers to D/C:            Co-evaluation              End of Session Equipment Utilized During  Treatment:  (hip kit)  Activity Tolerance:   Patient left: in bed;with call bell/phone within reach;with bed alarm set   Time: 1017-1040 OT Time Calculation (min): 23 min Charges:  OT General Charges $OT Visit: 1 Procedure OT Evaluation $OT Eval Low Complexity: 1 Procedure OT Treatments $Self Care/Home Management : 8-22 mins G-Codes:    Myrene Galas, MS/OTR/L  06/15/2015, 10:49 AM

## 2015-06-15 NOTE — Progress Notes (Addendum)
   Subjective: 1 Day Post-Op Procedure(s) (LRB): TOTAL HIP ARTHROPLASTY (Right) Patient reports pain as 2 on 0-10 scale and moderate.  Had severe pain during the night super imposed on some anxiety. Given ativan by on call dr. Vita Barley to be resting well. Patient is well, and has had no acute complaints or problems We will start therapy today.  Plan is to go Home after hospital stay. no nausea and no vomiting Patient denies any chest pains or shortness of breath. Objective: Vital signs in last 24 hours: Temp:  [96.4 F (35.8 C)-99.8 F (37.7 C)] 98.5 F (36.9 C) (01/05 0500) Pulse Rate:  [63-99] 92 (01/05 0500) Resp:  [14-23] 18 (01/05 0500) BP: (101-166)/(55-106) 120/80 mmHg (01/05 0500) SpO2:  [87 %-100 %] 93 % (01/05 0500) Weight:  [85.276 kg (188 lb)] 85.276 kg (188 lb) (01/04 1254) Dressing clean and dry Heels are non tender and elevated off the bed using rolled towels Intake/Output from previous day: 01/04 0701 - 01/05 0700 In: 5385 [P.O.:120; I.V.:3790; IV Piggyback:200] Out: 2805 [Urine:2300; Drains:255; Blood:250] Intake/Output this shift:     Recent Labs  06/15/15 0620  HGB 11.8*    Recent Labs  06/15/15 0620  WBC 11.9*  RBC 3.77*  HCT 35.0  PLT 194   No results for input(s): NA, K, CL, CO2, BUN, CREATININE, GLUCOSE, CALCIUM in the last 72 hours. No results for input(s): LABPT, INR in the last 72 hours.  EXAM General - Patient is Alert, Appropriate and Oriented Extremity - Neurologically intact Neurovascular intact Sensation intact distally Intact pulses distally Dorsiflexion/Plantar flexion intact Dressing - dressing C/D/I Motor Function - intact, moving foot and toes well on exam.    Past Medical History  Diagnosis Date  . Menopause   . Low back pain 11/95  . Restless leg syndrome 1999  . GERD (gastroesophageal reflux disease) 8/01     Dr. Tiffany Kocher  . Arthritis     Assessment/Plan: 1 Day Post-Op Procedure(s) (LRB): TOTAL HIP ARTHROPLASTY  (Right) Active Problems:   Primary osteoarthritis of right hip   S/P total hip arthroplasty  Estimated body mass index is 32.25 kg/(m^2) as calculated from the following:   Height as of this encounter: 5\' 4"  (1.626 m).   Weight as of this encounter: 85.276 kg (188 lb). Advance diet Up with therapy D/C IV fluids Plan for discharge tomorrow Discharge home with home health  Labs: reviewed DVT Prophylaxis - Lovenox, Foot Pumps and TED hose Weight-Bearing as tolerated to right  leg D/C O2 and Pulse OX and try on Room Air Begin working on a bowel movement celebrex added Mirapex increased to bid Labs in am  Wille Glaser R. [place] 06/15/2015, 7:20 AM

## 2015-06-15 NOTE — Progress Notes (Signed)
Physical Therapy Treatment Patient Details Name: Claudia Terry MRN: [id number] DOB: 1945/07/22 Today's Date: 06/15/2015    History of Present Illness Pt underwent R THR posterior approach. No reported post-op complications. Pt denies falls in the last 12 months.     PT Comments    Pt reports 9/10 pain in right hip/thigh with cramping in thigh/IT band. Pt has received medication and is quite sleepy. Pt notes she would like to get up in the chair; however, as she is quite uncomfortable in the bed. Pt participates in supine bed exercises with guarded, at times uncomfortable movement of the right lower extremity. Pt educated on posterior hip precautions with exercises and all functional movement. Requires Min A for bed mobility and transfers. Difficulty bearing weight through right lower extremity with stand transfer and ambulation demonstrating difficulty clearing either foot with short ambulation to the chair. Pt comfortable in the chair. Plan to see pt this afternoon for continued range of motion, strengthening and progression of all functional mobility.   Follow Up Recommendations  SNF     Equipment Recommendations  Rolling walker with 5" wheels    Recommendations for Other Services       Precautions / Restrictions Precautions Precautions: Fall Precaution Comments: posterior hip Restrictions Weight Bearing Restrictions: Yes RLE Weight Bearing: Weight bearing as tolerated Other Position/Activity Restrictions: posterior hip precautions    Mobility  Bed Mobility Overal bed mobility: Needs Assistance Bed Mobility: Supine to Sit     Supine to sit: Min assist     General bed mobility comments: Increased time with Min A for BLEs  Transfers Overall transfer level: Needs assistance Equipment used: Rolling walker (2 wheeled) Transfers: Sit to/from Stand Sit to Stand: Min guard (cues for posterior hip precautions and hand placement)         General transfer comment: slow to  rise; decreased weight shift to R hip  Ambulation/Gait Ambulation/Gait assistance: Min guard Ambulation Distance (Feet): 4 Feet Assistive device: Rolling walker (2 wheeled)   Gait velocity: slow Gait velocity interpretation: <1.8 ft/sec, indicative of risk for recurrent falls General Gait Details: poor ability to clear either foot from floor; pivots L and slides R despite cueing; heavy lean on rw   Stairs            Wheelchair Mobility    Modified Rankin (Stroke Patients Only)       Balance Overall balance assessment: Needs assistance Sitting-balance support: Bilateral upper extremity supported Sitting balance-Leahy Scale: Good     Standing balance support: Bilateral upper extremity supported Standing balance-Leahy Scale: Poor                      Cognition Arousal/Alertness: Lethargic Behavior During Therapy: WFL for tasks assessed/performed Overall Cognitive Status: Within Functional Limits for tasks assessed       Memory: Decreased recall of precautions              Exercises Total Joint Exercises Ankle Circles/Pumps: AROM;Both;20 reps;Supine Quad Sets: Strengthening;Both;20 reps;Supine Gluteal Sets: Strengthening;Both;20 reps;Supine Towel Squeeze: Strengthening;Both;20 reps;Supine Short Arc Quad: AROM;Right;20 reps;Supine Heel Slides: AAROM;Right;20 reps;Supine Hip ABduction/ADduction: AAROM;Right;20 reps;Supine Straight Leg Raises: AAROM;Right;10 reps;Supine    General Comments        Pertinent Vitals/Pain Pain Assessment: 0-10 Pain Score: 9  Pain Location: R hip/thigh Pain Descriptors / Indicators: Aching;Cramping Pain Intervention(s): Limited activity within patient's tolerance;Premedicated before session;Repositioned    Home Living Family/patient expects to be discharged to:: Private residence Living Arrangements: Spouse/significant other;Other relatives (  and mother) Available Help at Discharge: Family Type of Home:  House Home Access: Stairs to enter Entrance Stairs-Rails: Can reach both Home Layout: One level Home Equipment: Environmental consultant - 4 wheels;Cane - single point;Shower seat;Grab bars - tub/shower      Prior Function Level of Independence: Independent with assistive device(s)      Comments: single point cane at times   PT Goals (current goals can now be found in the care plan section) Acute Rehab PT Goals Patient Stated Goal: Would like to go home Progress towards PT goals: Progressing toward goals (slowly)    Frequency  BID    PT Plan Current plan remains appropriate    Co-evaluation             End of Session Equipment Utilized During Treatment: Gait belt Activity Tolerance: Patient limited by lethargy;Patient limited by pain Patient left: in chair;with call bell/phone within reach;with chair alarm set     Time: [id number] PT Time Calculation (min) (ACUTE ONLY): 36 min  Charges:  $Gait Training: 8-22 mins $Therapeutic Exercise: 8-22 mins                    G Codes:      Charlaine Dalton 06/15/2015, 12:46 PM

## 2015-06-15 NOTE — Progress Notes (Signed)
MD paged. Patient requesting additional dose of Mirapex.

## 2015-06-16 LAB — BASIC METABOLIC PANEL
Anion gap: 7 (ref 5–15)
BUN: 8 mg/dL (ref 6–20)
CO2: 28 mmol/L (ref 22–32)
Calcium: 8.5 mg/dL — ABNORMAL LOW (ref 8.9–10.3)
Chloride: 101 mmol/L (ref 101–111)
Creatinine, Ser: 0.64 mg/dL (ref 0.44–1.00)
GFR calc Af Amer: >60 mL/min (ref >60)
GFR calc non Af Amer: >60 mL/min (ref >60)
Glucose, Bld: 111 mg/dL — ABNORMAL HIGH (ref 65–99)
Potassium: 3.3 mmol/L — ABNORMAL LOW (ref 3.5–5.1)
Sodium: 136 mmol/L (ref 135–145)

## 2015-06-16 LAB — CBC
HCT: 35.4 % (ref 35.0–47.0)
Hemoglobin: 12.1 g/dL (ref 12.0–16.0)
MCH: 31.9 pg (ref 26.0–34.0)
MCHC: 34 g/dL (ref 32.0–36.0)
MCV: 93.7 fL (ref 80.0–100.0)
Platelets: 187 10*3/uL (ref 150–440)
RBC: 3.78 MIL/uL — ABNORMAL LOW (ref 3.80–5.20)
RDW: 12.2 % (ref 11.5–14.5)
WBC: 11.1 10*3/uL — ABNORMAL HIGH (ref 3.6–11.0)

## 2015-06-16 LAB — SURGICAL PATHOLOGY

## 2015-06-16 MED ORDER — ENOXAPARIN SODIUM 40 MG/0.4ML ~~LOC~~ SOLN: 40.0000 mg | Freq: Two times a day (BID) | SUBCUTANEOUS | Status: DC

## 2015-06-16 MED ORDER — OXYCODONE HCL 5 MG PO TABS: 5.0000 mg | ORAL_TABLET | ORAL | Status: DC | PRN

## 2015-06-16 MED ORDER — TRAMADOL HCL 50 MG PO TABS: 50.0000 mg | ORAL_TABLET | ORAL | Status: DC | PRN

## 2015-06-16 MED ORDER — CELECOXIB 200 MG PO CAPS: 200.0000 mg | ORAL_CAPSULE | Freq: Two times a day (BID) | ORAL | Status: DC

## 2015-06-16 NOTE — Discharge Summary (Signed)
Physician Discharge Summary  Subjective: 2 Days Post-Op Procedure(s) (LRB): TOTAL HIP ARTHROPLASTY (Right) Patient reports pain as moderate.   Patient seen in rounds with Dr. Marry Guan. Patient is well, and has had no acute complaints or problems Patient is ready to go home with home health physical therapy.  Physician Discharge Summary  Patient ID: Claudia Terry MRN: [id number] DOB/AGE: Apr 29, 1946 70 y.o.  Admit date: 06/14/2015 Discharge date: 06/17/2015  Admission Diagnoses: Right hip degenerative joint disease. Discharge Diagnoses:  Active Problems:   Primary osteoarthritis of right hip   S/P total hip arthroplasty   Discharged Condition: good  Hospital Course: The patient is status post right total hip arthroplasty done by Dr. Marry Guan. The patient is on postop day 2 and doing progressively well. The patient still has some moderate pain with ambulation. She ambulated 40 feet with physical therapy. Her drain was removed today. She is planning on going home with home health physical therapy on postop day 3 tomorrow.  Treatments: surgery:  PROCEDURE: Right total hip arthroplasty  SURGEON: Marciano Sequin. M.D.  ASSISTANT: Rachelle Hora, PA-C (present and scrubbed throughout the case, critical for assistance with exposure, retraction, instrumentation, and closure)  ANESTHESIA: general  ESTIMATED BLOOD LOSS: 250 mL  FLUIDS REPLACED: 1500 mL of crystalloid  DRAINS: 2 medium drains to a Hemovac reservoir  IMPLANTS UTILIZED: DePuy 12 mm small stature AML femoral stem, 54 mm OD Pinnacle 100 acetabular component, +4 mm 10 Pinnacle Marathon polyethylene insert, and a 36 mm M-SPEC +1.5 mm hip ball  Discharge Exam: Blood pressure 121/61, pulse 97, temperature 98.4 F (36.9 C), temperature source Oral, resp. rate 18, height 5\' 4"  (1.626 m), weight 85.276 kg (188 lb), last menstrual period 02/08/2005, SpO2 96 %.   Disposition: 01-Home or Self Care     Medication List     TAKE these medications        acetaminophen 500 MG tablet  Commonly known as:  TYLENOL  Take 500 mg by mouth every 6 (six) hours as needed for pain.     atenolol 50 MG tablet  Commonly known as:  TENORMIN  Take 50 mg by mouth daily. In am     celecoxib 200 MG capsule  Commonly known as:  CELEBREX  Take 1 capsule (200 mg total) by mouth 2 (two) times daily.     Cinnamon 500 MG capsule  Take 500 mg by mouth daily.     enoxaparin 40 MG/0.4ML injection  Commonly known as:  LOVENOX  Inject 0.4 mLs (40 mg total) into the skin every 12 (twelve) hours.     HAIR/SKIN/NAILS PO  Take by mouth daily.     HAIR SKIN AND NAILS FORMULA PO  Take by mouth 2 (two) times daily.     loratadine-pseudoephedrine 10-240 MG 24 hr tablet  Commonly known as:  CLARITIN-D 24-hour  Take 1 tablet by mouth daily.     Melatonin 10 MG Tabs  Take 10 mg by mouth at bedtime.     MIRAPEX 0.5 MG tablet  Generic drug:  pramipexole  Take 0.5 mg by mouth at bedtime.     omeprazole 20 MG capsule  Commonly known as:  PRILOSEC  Take 20 mg by mouth 2 (two) times daily before a meal.     oxyCODONE 5 MG immediate release tablet  Commonly known as:  Oxy IR/ROXICODONE  Take 1-2 tablets (5-10 mg total) by mouth every 4 (four) hours as needed for severe pain.     Red  Yeast Rice 600 MG Tabs  Take 1,200 mg by mouth. 2 daily     traMADol 50 MG tablet  Commonly known as:  ULTRAM  Take 1-2 tablets (50-100 mg total) by mouth every 4 (four) hours as needed for moderate pain.     Turmeric 500 MG Caps  Take by mouth every morning.     vitamin C 1000 MG tablet  Take 1,000 mg by mouth daily.     Vitamin D3 1000 units Caps  Take 5,000 Units by mouth daily.           Follow-up Information    Follow up with Dereck Leep, MD On 07/27/2015.   Specialty:  Orthopedic Surgery   Why:  At 11:15 AM   Contact information:   [place] [id number] [phone]        Signed: Prescott Parma, Keagan Brislin 06/16/2015, 7:33 AM   Objective: Vital signs in last 24 hours: Temp:  [97.6 F (36.4 C)-99.4 F (37.4 C)] 98.4 F (36.9 C) (01/06 0348) Pulse Rate:  [79-97] 97 (01/06 0348) Resp:  [16-18] 18 (01/06 0348) BP: (101-121)/(51-61) 121/61 mmHg (01/06 0348) SpO2:  [91 %-100 %] 96 % (01/06 0348)  Intake/Output from previous day:  Intake/Output Summary (Last 24 hours) at 06/16/15 0733 Last data filed at 06/16/15 0723  Gross per 24 hour  Intake 1138.33 ml  Output    900 ml  Net 238.33 ml    Intake/Output this shift:    Labs:  Recent Labs  06/15/15 0620 06/16/15 0627  HGB 11.8* 12.1    Recent Labs  06/15/15 0620 06/16/15 0627  WBC 11.9* 11.1*  RBC 3.77* 3.78*  HCT 35.0 35.4  PLT 194 187    Recent Labs  06/15/15 0620 06/16/15 0627  NA 137 136  K 3.3* 3.3*  CL 107 101  CO2 28 28  BUN 10 8  CREATININE 0.66 0.64  GLUCOSE 114* 111*  CALCIUM 8.5* 8.5*   No results for input(s): LABPT, INR in the last 72 hours.  EXAM: General - Patient is Alert and Oriented Extremity - Intact pulses distally Dorsiflexion/Plantar flexion intact No cellulitis present Compartment soft Incision - clean, dry, no drainage Motor Function -  the patient is ambulating 40 feet. The patient is able to do transfers between the bed and chair. She has plantarflexion and dorsiflexion that are intact.  Assessment/Plan: 2 Days Post-Op Procedure(s) (LRB): TOTAL HIP ARTHROPLASTY (Right) Procedure(s) (LRB): TOTAL HIP ARTHROPLASTY (Right) Past Medical History  Diagnosis Date  . Menopause   . Low back pain 11/95  . Restless leg syndrome 1999  . GERD (gastroesophageal reflux disease) 8/01     Dr. Tiffany Kocher  . Arthritis    Active Problems:   Primary osteoarthritis of right hip   S/P total hip arthroplasty  Estimated body mass index is 32.25 kg/(m^2) as calculated from the following:   Height as of this encounter: 5\' 4"  (1.626 m).   Weight as of this encounter:  85.276 kg (188 lb). Plan for discharge tomorrow Discharge home with home health Diet - Regular diet Follow up - in 6 weeks Activity - WBAT Disposition - Home Condition Upon Discharge - Good DVT Prophylaxis - Lovenox and TED hose  Reche Dixon, PA-C Orthopaedic Surgery 06/16/2015, 7:33 AM

## 2015-06-16 NOTE — Progress Notes (Signed)
  Subjective: 2 Days Post-Op Procedure(s) (LRB): TOTAL HIP ARTHROPLASTY (Right) Patient reports pain as moderate.   Patient seen in rounds with Dr. Marry Guan. Patient is well, and has had no acute complaints or problems. The patient think some pain with transfers. Plan is to go Home after hospital stay. The patient has good family support at home and is still wanted to go home tomorrow. Negative for chest pain and shortness of breath Fever: no Gastrointestinal: Negative for nausea and vomiting  Objective: Vital signs in last 24 hours: Temp:  [97.6 F (36.4 C)-99.4 F (37.4 C)] 98.4 F (36.9 C) (01/06 0348) Pulse Rate:  [79-97] 97 (01/06 0348) Resp:  [16-18] 18 (01/06 0348) BP: (101-121)/(51-61) 121/61 mmHg (01/06 0348) SpO2:  [91 %-100 %] 96 % (01/06 0348)  Intake/Output from previous day:  Intake/Output Summary (Last 24 hours) at 06/16/15 0728 Last data filed at 06/16/15 0723  Gross per 24 hour  Intake 1138.33 ml  Output    900 ml  Net 238.33 ml    Intake/Output this shift:    Labs:  Recent Labs  06/15/15 0620 06/16/15 0627  HGB 11.8* 12.1    Recent Labs  06/15/15 0620 06/16/15 0627  WBC 11.9* 11.1*  RBC 3.77* 3.78*  HCT 35.0 35.4  PLT 194 187    Recent Labs  06/15/15 0620 06/16/15 0627  NA 137 136  K 3.3* 3.3*  CL 107 101  CO2 28 28  BUN 10 8  CREATININE 0.66 0.64  GLUCOSE 114* 111*  CALCIUM 8.5* 8.5*   No results for input(s): LABPT, INR in the last 72 hours.   EXAM General - Patient is Alert and Oriented Extremity - Neurovascular intact Intact pulses distally No cellulitis present Compartment soft Dressing/Incision - clean, dry, no drainage. The Hemovac was removed with no complication. Motor Function - intact, moving foot and toes well on exam. The patient ambulated 40 feet with physical therapy.  Past Medical History  Diagnosis Date  . Menopause   . Low back pain 11/95  . Restless leg syndrome 1999  . GERD (gastroesophageal reflux  disease) 8/01     Dr. Tiffany Kocher  . Arthritis     Assessment/Plan: 2 Days Post-Op Procedure(s) (LRB): TOTAL HIP ARTHROPLASTY (Right) Active Problems:   Primary osteoarthritis of right hip   S/P total hip arthroplasty  Estimated body mass index is 32.25 kg/(m^2) as calculated from the following:   Height as of this encounter: 5\' 4"  (1.626 m).   Weight as of this encounter: 85.276 kg (188 lb). Up with therapy Plan for discharge tomorrow Discharge home with home health  The patient will need to have a bowel movement today.  DVT Prophylaxis - Lovenox, Foot Pumps and TED hose Weight-Bearing as tolerated to right leg  Reche Dixon, PA-C Orthopaedic Surgery 06/16/2015, 7:28 AM

## 2015-06-16 NOTE — Progress Notes (Signed)
Patient discharging home. Instructions and prescriptions given to patient, verbalized understanding. Husband providing transportation home.

## 2015-06-16 NOTE — Progress Notes (Signed)
Physical Therapy Treatment Patient Details Name: Claudia Terry MRN: [id number] DOB: [birth year] Today's Date: 06/16/2015    History of Present Illness Pt underwent R THR posterior approach. No reported post-op complications. Pt denies falls in the last 12 months.     PT Comments    Pt showed great improvement with ambulation and mobility.  She was able to keep wheels moving with consistent forward motion, showed good tolerance with R WBing and was able to negotiate up/down 4 steps without issue.  Pt continues to have some pain but it is not a big limiter today.  Pt eager to go home and did well enough that she should be safe in doing this.   Follow Up Recommendations  Home health PT     Equipment Recommendations       Recommendations for Other Services       Precautions / Restrictions Precautions Precautions: Fall Precaution Comments: posterior hip Restrictions RLE Weight Bearing: Weight bearing as tolerated    Mobility  Bed Mobility Overal bed mobility: Modified Independent Bed Mobility: Supine to Sit     Supine to sit: Min guard     General bed mobility comments: Pt needing UE use of rails, but was able to get to sitting w/o direct physical assist  Transfers Overall transfer level: Modified independent Equipment used: Rolling walker (2 wheeled) Transfers: Sit to/from Stand Sit to Stand: Min guard         General transfer comment: Pt is able to rise with good confidence and though she has some stated hesitancy she was able to get up w/o assist  Ambulation/Gait Ambulation/Gait assistance: Min guard Ambulation Distance (Feet): 260 Feet Assistive device: Rolling walker (2 wheeled)       General Gait Details: Pt shows greatly improved cadence, confidence, speed and tolerance with ambualtion today.  She shows ability to maintain forward walker momentum during R stance phase and though she has some pain increase it is not functionally limiting.    Stairs Stairs:  Yes Stairs assistance: Supervision Stair Management: Two rails Number of Stairs: 4 General stair comments: Pt is able to efficiently and confidently negotiate up/down 4 steps with only CGA  Wheelchair Mobility    Modified Rankin (Stroke Patients Only)       Balance                                    Cognition Arousal/Alertness: Suspect due to medications (still able to interact appropriately) Behavior During Therapy: WFL for tasks assessed/performed Overall Cognitive Status: Within Functional Limits for tasks assessed                      Exercises Total Joint Exercises Ankle Circles/Pumps: Strengthening;10 reps Quad Sets: 10 reps;Strengthening Gluteal Sets: Strengthening;10 reps Short Arc Quad: Strengthening;10 reps Heel Slides: Strengthening;10 reps Hip ABduction/ADduction: AROM;10 reps Straight Leg Raises: AROM;10 reps    General Comments        Pertinent Vitals/Pain Pain Assessment: 0-10 Pain Score: 3  (increases to a max of 6/10 with ambulation)    Home Living                      Prior Function            PT Goals (current goals can now be found in the care plan section) Progress towards PT goals: Progressing toward goals    Frequency  BID    PT Plan Discharge plan needs to be updated    Co-evaluation             End of Session Equipment Utilized During Treatment: Gait belt Activity Tolerance: Patient tolerated treatment well Patient left: with chair alarm set;with call bell/phone within reach     Time: 0931-1000 PT Time Calculation (min) (ACUTE ONLY): 29 min  Charges:  $Gait Training: 8-22 mins $Therapeutic Exercise: 8-22 mins                    G Codes:     Wayne Both, PT, DPT [phone]   Kreg Shropshire 06/16/2015, 11:34 AM

## 2015-06-16 NOTE — Care Management Note (Addendum)
Case Management Note  Patient Details  Name: Claudia Terry MRN: [id number] Date of Birth: 01/05/1946  Subjective/Objective:         Walked 250 feet and used stairs per PT. Has a home rolling walker and a bedside commode. Call to Corliss Blacker at [facility] that Mrs Stefano is discharging home with [facility] home health PT hopefully today as soon as she has a bowel movement. Advised Ms Kempen that her co-pay for Lovenox would be $85 per [place].          Action/Plan:   Expected Discharge Date:  06/17/15               Expected Discharge Plan:     In-House Referral:     Discharge planning Services     Post Acute Care Choice:    Choice offered to:     DME Arranged:    DME Agency:     HH Arranged:    HH Agency:     Status of Service:     Medicare Important Message Given:    Date Medicare IM Given:    Medicare IM give by:    Date Additional Medicare IM Given:    Additional Medicare Important Message give by:     If discussed at [place] of Stay Meetings, dates discussed:    Additional Comments:  Kashius Dominic A, RN 06/16/2015, 10:51 AM

## 2015-06-16 NOTE — Discharge Instructions (Signed)
POSTERIOR TOTAL HIP REPLACEMENT POSTOPERATIVE DIRECTIONS ° °Hip Rehabilitation, Guidelines Following Surgery  °The results of a hip operation are greatly improved after range of motion and muscle strengthening exercises. Follow all safety measures which are given to protect your hip. If any of these exercises cause increased pain or swelling in your joint, decrease the amount until you are comfortable again. Then slowly increase the exercises. Call your caregiver if you have problems or questions.  ° °HOME CARE INSTRUCTIONS  °Remove items at home which could result in a fall. This includes throw rugs or furniture in walking pathways.  °· ICE to the affected hip every three hours for 30 minutes at a time and then as needed for pain and swelling.  Continue to use ice on the hip for pain and swelling from surgery. You may notice swelling that will progress down to the foot and ankle.  This is normal after surgery.  Elevate the leg when you are not up walking on it.   °· Continue to use the breathing machine which will help keep your temperature down.  It is common for your temperature to cycle up and down following surgery, especially at night when you are not up moving around and exerting yourself.  The breathing machine keeps your lungs expanded and your temperature down. ° °DIET °You may resume your previous home diet once your are discharged from the hospital. ° °DRESSING / WOUND CARE / SHOWERING °Keep the surgical dressing until follow up.  The dressing is water proof, so you can shower without any extra covering.  IF THE DRESSING FALLS OFF or the wound gets wet inside, change the dressing with sterile gauze.  Please use good hand washing techniques before changing the dressing.  Do not use any lotions or creams on the incision until instructed by your surgeon.   °You need to keep your dressing dry after discharge.   °Change the surgical dressing if needed with Physical Therapy and reapply a dry dressing each  time. ° ° ° °ACTIVITY °Walk with your walker as instructed. °Use walker as long as suggested by your caregivers. °Avoid periods of inactivity such as sitting longer than an hour when not asleep. This helps prevent blood clots.  °You may resume a sexual relationship in one month or when given the OK by your doctor.  °You may return to work once you are cleared by your doctor.  °Do not drive a car for 6 weeks or until released by you surgeon.  °Do not drive while taking narcotics. ° °WEIGHT BEARING °Weight bearing as tolerated with assist device (walker, cane, etc) as directed, use it as long as suggested by your surgeon or therapist, typically at least 4-6 weeks. ° °POSTOPERATIVE CONSTIPATION PROTOCOL °Constipation - defined medically as fewer than three stools per week and severe constipation as less than one stool per week. ° °One of the most common issues patients have following surgery is constipation.  Even if you have a regular bowel pattern at home, your normal regimen is likely to be disrupted due to multiple reasons following surgery.  Combination of anesthesia, postoperative narcotics, change in appetite and fluid intake all can affect your bowels.  In order to avoid complications following surgery, here are some recommendations in order to help you during your recovery period. ° °Colace (docusate) - Pick up an over-the-counter form of Colace or another stool softener and take twice a day as long as you are requiring postoperative pain medications.  Take with a   full glass of water daily.  If you experience loose stools or diarrhea, hold the colace until you stool forms back up.  If your symptoms do not get better within 1 week or if they get worse, check with your doctor. ° °Dulcolax (bisacodyl) - Pick up over-the-counter and take as directed by the product packaging as needed to assist with the movement of your bowels.  Take with a full glass of water.  Use this product as needed if not relieved by Colace  only.  ° °MiraLax (polyethylene glycol) - Pick up over-the-counter to have on hand.  MiraLax is a solution that will increase the amount of water in your bowels to assist with bowel movements.  Take as directed and can mix with a glass of water, juice, soda, coffee, or tea.  Take if you go more than two days without a movement. °Do not use MiraLax more than once per day. Call your doctor if you are still constipated or irregular after using this medication for 7 days in a row. ° °If you continue to have problems with postoperative constipation, please contact the office for further assistance and recommendations.  If you experience "the worst abdominal pain ever" or develop nausea or vomiting, please contact the office immediatly for further recommendations for treatment. ° °ITCHING ° If you experience itching with your medications, try taking only a single pain pill, or even half a pain pill at a time.  You can also use Benadryl over the counter for itching or also to help with sleep.  ° °TED HOSE STOCKINGS °Wear the elastic stockings on both legs for three weeks following surgery during the day but you may remove then at night for sleeping. ° °MEDICATIONS °See your medication summary on the “After Visit Summary” that the nursing staff will review with you prior to discharge.  You may have some home medications which will be placed on hold until you complete the course of blood thinner medication.  It is important for you to complete the blood thinner medication as prescribed by your surgeon.  Continue your approved medications as instructed at time of discharge. ° °PRECAUTIONS °If you experience chest pain or shortness of breath - call 911 immediately for transfer to the hospital emergency department.  °If you develop a fever greater that 101 F, purulent drainage from wound, increased redness or drainage from wound, foul odor from the wound/dressing, or calf pain - CONTACT YOUR SURGEON.   °                                                 °FOLLOW-UP APPOINTMENTS °Make sure you keep all of your appointments after your operation with your surgeon and caregivers. You should call the office at the above phone number and make an appointment for approximately two weeks after the date of your surgery or on the date instructed by your surgeon outlined in the "After Visit Summary". ° °RANGE OF MOTION AND STRENGTHENING EXERCISES  °These exercises are designed to help you keep full movement of your hip joint. Follow your caregiver's or physical therapist's instructions. Perform all exercises about fifteen times, three times per day or as directed. Exercise both hips, even if you have had only one joint replacement. These exercises can be done on a training (exercise) mat, on the floor, on a table or on a   bed. Use whatever works the best and is most comfortable for you. Use music or television while you are exercising so that the exercises are a pleasant break in your day. This will make your life better with the exercises acting as a break in routine you can look forward to.  °Lying on your back, slowly slide your foot toward your buttocks, raising your knee up off the floor. Then slowly slide your foot back down until your leg is straight again.  °Lying on your back spread your legs as far apart as you can without causing discomfort.  °Lying on your side, raise your upper leg and foot straight up from the floor as far as is comfortable. Slowly lower the leg and repeat.  °Lying on your back, tighten up the muscle in the front of your thigh (quadriceps muscles). You can do this by keeping your leg straight and trying to raise your heel off the floor. This helps strengthen the largest muscle supporting your knee.  °Lying on your back, tighten up the muscles of your buttocks both with the legs straight and with the knee bent at a comfortable angle while keeping your heel on the floor.  ° ° ° ° °IF YOU ARE TRANSFERRED TO A SKILLED REHAB  FACILITY °If the patient is transferred to a skilled rehab facility following release from the hospital, a list of the current medications will be sent to the facility for the patient to continue.  When discharged from the skilled rehab facility, please have the facility set up the patient's Home Health Physical Therapy prior to being released. Also, the skilled facility will be responsible for providing the patient with their medications at time of release from the facility to include their pain medication, the muscle relaxants, and their blood thinner medication. If the patient is still at the rehab facility at time of the two week follow up appointment, the skilled rehab facility will also need to assist the patient in arranging follow up appointment in our office and any transportation needs. ° °MAKE SURE YOU:  °Understand these instructions.  °Get help right away if you are not doing well or get worse.  ° ° °Pick up stool softner and laxative for home use following surgery while on pain medications. °Do not submerge incision under water. °Please use good hand washing techniques while changing dressing each day. °May shower starting three days after surgery. °Please use a clean towel to pat the incision dry following showers. °Continue to use ice for pain and swelling after surgery. °Do not use any lotions or creams on the incision until instructed by your surgeon. °

## 2015-06-18 DIAGNOSIS — G2581 Restless legs syndrome: Secondary | ICD-10-CM | POA: Diagnosis not present

## 2015-06-18 DIAGNOSIS — K219 Gastro-esophageal reflux disease without esophagitis: Secondary | ICD-10-CM | POA: Diagnosis not present

## 2015-06-18 DIAGNOSIS — Z471 Aftercare following joint replacement surgery: Secondary | ICD-10-CM | POA: Diagnosis not present

## 2015-06-18 DIAGNOSIS — M545 Low back pain: Secondary | ICD-10-CM | POA: Diagnosis not present

## 2015-06-18 DIAGNOSIS — Z96641 Presence of right artificial hip joint: Secondary | ICD-10-CM | POA: Diagnosis not present

## 2015-06-18 DIAGNOSIS — Z79891 Long term (current) use of opiate analgesic: Secondary | ICD-10-CM | POA: Diagnosis not present

## 2015-06-26 DIAGNOSIS — Z96641 Presence of right artificial hip joint: Secondary | ICD-10-CM | POA: Diagnosis not present

## 2015-06-26 DIAGNOSIS — K219 Gastro-esophageal reflux disease without esophagitis: Secondary | ICD-10-CM | POA: Diagnosis not present

## 2015-06-26 DIAGNOSIS — M545 Low back pain: Secondary | ICD-10-CM | POA: Diagnosis not present

## 2015-06-26 DIAGNOSIS — G2581 Restless legs syndrome: Secondary | ICD-10-CM | POA: Diagnosis not present

## 2015-06-26 DIAGNOSIS — Z79891 Long term (current) use of opiate analgesic: Secondary | ICD-10-CM | POA: Diagnosis not present

## 2015-06-26 DIAGNOSIS — Z471 Aftercare following joint replacement surgery: Secondary | ICD-10-CM | POA: Diagnosis not present

## 2015-06-28 DIAGNOSIS — G2581 Restless legs syndrome: Secondary | ICD-10-CM | POA: Diagnosis not present

## 2015-06-28 DIAGNOSIS — K219 Gastro-esophageal reflux disease without esophagitis: Secondary | ICD-10-CM | POA: Diagnosis not present

## 2015-06-28 DIAGNOSIS — Z471 Aftercare following joint replacement surgery: Secondary | ICD-10-CM | POA: Diagnosis not present

## 2015-06-28 DIAGNOSIS — M545 Low back pain: Secondary | ICD-10-CM | POA: Diagnosis not present

## 2015-06-28 DIAGNOSIS — Z79891 Long term (current) use of opiate analgesic: Secondary | ICD-10-CM | POA: Diagnosis not present

## 2015-06-28 DIAGNOSIS — Z96641 Presence of right artificial hip joint: Secondary | ICD-10-CM | POA: Diagnosis not present

## 2015-07-04 DIAGNOSIS — G2581 Restless legs syndrome: Secondary | ICD-10-CM | POA: Diagnosis not present

## 2015-07-04 DIAGNOSIS — Z471 Aftercare following joint replacement surgery: Secondary | ICD-10-CM | POA: Diagnosis not present

## 2015-07-04 DIAGNOSIS — M545 Low back pain: Secondary | ICD-10-CM | POA: Diagnosis not present

## 2015-07-04 DIAGNOSIS — Z96641 Presence of right artificial hip joint: Secondary | ICD-10-CM | POA: Diagnosis not present

## 2015-07-04 DIAGNOSIS — Z79891 Long term (current) use of opiate analgesic: Secondary | ICD-10-CM | POA: Diagnosis not present

## 2015-07-04 DIAGNOSIS — K219 Gastro-esophageal reflux disease without esophagitis: Secondary | ICD-10-CM | POA: Diagnosis not present

## 2015-07-12 DIAGNOSIS — M545 Low back pain: Secondary | ICD-10-CM | POA: Diagnosis not present

## 2015-07-12 DIAGNOSIS — Z96641 Presence of right artificial hip joint: Secondary | ICD-10-CM | POA: Diagnosis not present

## 2015-07-12 DIAGNOSIS — Z471 Aftercare following joint replacement surgery: Secondary | ICD-10-CM | POA: Diagnosis not present

## 2015-07-12 DIAGNOSIS — Z79891 Long term (current) use of opiate analgesic: Secondary | ICD-10-CM | POA: Diagnosis not present

## 2015-07-12 DIAGNOSIS — G2581 Restless legs syndrome: Secondary | ICD-10-CM | POA: Diagnosis not present

## 2015-07-12 DIAGNOSIS — K219 Gastro-esophageal reflux disease without esophagitis: Secondary | ICD-10-CM | POA: Diagnosis not present

## 2015-07-27 DIAGNOSIS — Z96641 Presence of right artificial hip joint: Secondary | ICD-10-CM | POA: Diagnosis not present

## 2015-08-08 ENCOUNTER — Observation Stay
Admission: EM | Admit: 2015-08-08 | Discharge: 2015-08-09 | Disposition: A | Discharge status: Home / Self Care | Payer: PPO | Attending: Emergency Medicine | Admitting: Emergency Medicine

## 2015-08-08 ENCOUNTER — Encounter: Payer: Self-pay | Admitting: Emergency Medicine

## 2015-08-08 ENCOUNTER — Emergency Department: Payer: PPO

## 2015-08-08 ENCOUNTER — Encounter
Admission: EM | Disposition: A | Discharge status: Home / Self Care | Payer: Self-pay | Source: Home / Self Care | Attending: Emergency Medicine

## 2015-08-08 DIAGNOSIS — Z801 Family history of malignant neoplasm of trachea, bronchus and lung: Secondary | ICD-10-CM | POA: Diagnosis not present

## 2015-08-08 DIAGNOSIS — W1809XA Striking against other object with subsequent fall, initial encounter: Secondary | ICD-10-CM | POA: Diagnosis not present

## 2015-08-08 DIAGNOSIS — S73005A Unspecified dislocation of left hip, initial encounter: Secondary | ICD-10-CM | POA: Diagnosis not present

## 2015-08-08 DIAGNOSIS — Z9889 Other specified postprocedural states: Secondary | ICD-10-CM

## 2015-08-08 DIAGNOSIS — S73015A Posterior dislocation of left hip, initial encounter: Secondary | ICD-10-CM

## 2015-08-08 DIAGNOSIS — Z880 Allergy status to penicillin: Secondary | ICD-10-CM | POA: Insufficient documentation

## 2015-08-08 DIAGNOSIS — Z833 Family history of diabetes mellitus: Secondary | ICD-10-CM | POA: Insufficient documentation

## 2015-08-08 DIAGNOSIS — Z79899 Other long term (current) drug therapy: Secondary | ICD-10-CM | POA: Insufficient documentation

## 2015-08-08 DIAGNOSIS — T148 Other injury of unspecified body region: Secondary | ICD-10-CM | POA: Diagnosis not present

## 2015-08-08 DIAGNOSIS — M1990 Unspecified osteoarthritis, unspecified site: Secondary | ICD-10-CM | POA: Insufficient documentation

## 2015-08-08 DIAGNOSIS — G2581 Restless legs syndrome: Secondary | ICD-10-CM | POA: Diagnosis not present

## 2015-08-08 DIAGNOSIS — Z8249 Family history of ischemic heart disease and other diseases of the circulatory system: Secondary | ICD-10-CM | POA: Diagnosis not present

## 2015-08-08 DIAGNOSIS — T84021A Dislocation of internal left hip prosthesis, initial encounter: Secondary | ICD-10-CM | POA: Diagnosis not present

## 2015-08-08 DIAGNOSIS — Z8041 Family history of malignant neoplasm of ovary: Secondary | ICD-10-CM | POA: Diagnosis not present

## 2015-08-08 DIAGNOSIS — K219 Gastro-esophageal reflux disease without esophagitis: Secondary | ICD-10-CM | POA: Insufficient documentation

## 2015-08-08 DIAGNOSIS — IMO0001 Reserved for inherently not codable concepts without codable children: Secondary | ICD-10-CM

## 2015-08-08 DIAGNOSIS — Z96643 Presence of artificial hip joint, bilateral: Secondary | ICD-10-CM | POA: Diagnosis not present

## 2015-08-08 HISTORY — PX: HIP CLOSED REDUCTION: SHX983

## 2015-08-08 LAB — BASIC METABOLIC PANEL
Anion gap: 9 (ref 5–15)
BUN: 17 mg/dL (ref 6–20)
CO2: 25 mmol/L (ref 22–32)
Calcium: 9.1 mg/dL (ref 8.9–10.3)
Chloride: 104 mmol/L (ref 101–111)
Creatinine, Ser: 0.69 mg/dL (ref 0.44–1.00)
GFR calc Af Amer: >60 mL/min (ref >60)
GFR calc non Af Amer: >60 mL/min (ref >60)
Glucose, Bld: 118 mg/dL — ABNORMAL HIGH (ref 65–99)
Potassium: 3.1 mmol/L — ABNORMAL LOW (ref 3.5–5.1)
Sodium: 138 mmol/L (ref 135–145)

## 2015-08-08 LAB — CBC WITH DIFFERENTIAL/PLATELET
Basophils Absolute: 0 10*3/uL (ref 0–0.1)
Basophils Relative: 0 %
Eosinophils Absolute: 0 10*3/uL (ref 0–0.7)
Eosinophils Relative: 0 %
HCT: 38.3 % (ref 35.0–47.0)
Hemoglobin: 13.1 g/dL (ref 12.0–16.0)
Lymphocytes Relative: 15 %
Lymphs Abs: 1.4 10*3/uL (ref 1.0–3.6)
MCH: 31.6 pg (ref 26.0–34.0)
MCHC: 34.1 g/dL (ref 32.0–36.0)
MCV: 92.5 fL (ref 80.0–100.0)
Monocytes Absolute: 0.4 10*3/uL (ref 0.2–0.9)
Monocytes Relative: 4 %
Neutro Abs: 7.6 10*3/uL — ABNORMAL HIGH (ref 1.4–6.5)
Neutrophils Relative %: 81 %
Platelets: 206 10*3/uL (ref 150–440)
RBC: 4.14 MIL/uL (ref 3.80–5.20)
RDW: 13.1 % (ref 11.5–14.5)
WBC: 9.5 10*3/uL (ref 3.6–11.0)

## 2015-08-08 SURGERY — CLOSED REDUCTION, HIP
Anesthesia: General | Laterality: Left

## 2015-08-08 MED ORDER — HYDROMORPHONE HCL 1 MG/ML IJ SOLN
1.0000 mg | Freq: Once | INTRAMUSCULAR | Status: AC
  Administered 2015-08-08: 1 mg via INTRAVENOUS
  Filled 2015-08-08: qty 1

## 2015-08-08 MED ORDER — HYDROMORPHONE HCL 1 MG/ML IJ SOLN: 1.0000 mg | INTRAMUSCULAR | Status: DC

## 2015-08-08 MED ORDER — SODIUM CHLORIDE 0.9 % IV SOLN
INTRAVENOUS | Status: DC
  Administered 2015-08-08 – 2015-08-09 (×2): via INTRAVENOUS

## 2015-08-08 SURGICAL SUPPLY — 2 items
PILLOW ABDUC SM (MISCELLANEOUS) ×2 IMPLANT
TRAY FOLEY W/METER SILVER 16FR (SET/KITS/TRAYS/PACK) ×2 IMPLANT

## 2015-08-08 NOTE — ED Provider Notes (Signed)
[facility] Emergency Department Provider Note  ____________________________________________  Time seen: 7:00 PM  I have reviewed the triage vital signs and the nursing notes.   HISTORY  Chief Complaint Hip Pain    HPI Claudia Terry is a 70 y.o. female with bilateral hip prostheses who was leaning over to pick up something at work today when she's had a sudden pop and severe pain in the left hip and could not bear any weight. She lowered herself down milligrams daily as she could. Severe persistent pain of the left hip, worse with any movement at all. Orthopedic surgeon is Dr. Marry Guan. No numbness tingling or weakness in the leg.  Patient is NPO since noon today.    Past Medical History  Diagnosis Date  . Menopause   . Low back pain 11/95  . Restless leg syndrome 1999  . GERD (gastroesophageal reflux disease) 8/01     Dr. Tiffany Kocher  . Arthritis      Patient Active Problem List   Diagnosis Date Noted  . Primary osteoarthritis of right hip 06/14/2015  . S/P total hip arthroplasty 06/14/2015     Past Surgical History  Procedure Laterality Date  . Phlebitis groin    . Total hip arthroplasty Left 05/08/11    necrosis of hip  . Cesarean section  1972, 1975    X2  . Colonoscopy  12/06/11    normal  . Knee arthroscopy Right 11/14/2014    Procedure: ARTHROSCOPY KNEE;  Surgeon: Dereck Leep, MD;  Location: [facility] ORS;  Service: Orthopedics;  Laterality: Right;  Posterior horn menicus, anterior lateral menicus grade 3 chondromalacia  . Esophagogastroduodenoscopy (egd) with propofol N/A 02/23/2015    Procedure: ESOPHAGOGASTRODUODENOSCOPY (EGD) WITH PROPOFOL;  Surgeon: Manya Silvas, MD;  Location: [place];  Service: Endoscopy;  Laterality: N/A;  . Tonsillectomy    . Total hip arthroplasty Right 06/14/2015    Procedure: TOTAL HIP ARTHROPLASTY;  Surgeon: Dereck Leep, MD;  Location: [facility] ORS;  Service: Orthopedics;  Laterality: Right;      Current Outpatient Rx  Name  Route  Sig  Dispense  Refill  . acetaminophen (TYLENOL) 500 MG tablet   Oral   Take 500 mg by mouth every 6 (six) hours as needed for pain.         . Ascorbic Acid (VITAMIN C) 1000 MG tablet   Oral   Take 1,000 mg by mouth daily.         Marland Kitchen atenolol (TENORMIN) 50 MG tablet   Oral   Take 50 mg by mouth daily. In am         . celecoxib (CELEBREX) 200 MG capsule   Oral   Take 1 capsule (200 mg total) by mouth 2 (two) times daily.   60 capsule   1   . Cholecalciferol (VITAMIN D3) 1000 UNITS CAPS   Oral   Take 5,000 Units by mouth daily.         . Cinnamon 500 MG capsule   Oral   Take 500 mg by mouth daily.         Marland Kitchen enoxaparin (LOVENOX) 40 MG/0.4ML injection   Subcutaneous   Inject 0.4 mLs (40 mg total) into the skin every 12 (twelve) hours.   14 Syringe   0   . loratadine-pseudoephedrine (CLARITIN-D 24-HOUR) 10-240 MG 24 hr tablet   Oral   Take 1 tablet by mouth daily.         . Melatonin 10 MG  TABS   Oral   Take 10 mg by mouth at bedtime.         . Multiple Vitamins-Minerals (HAIR SKIN AND NAILS FORMULA PO)   Oral   Take by mouth 2 (two) times daily.         . Multiple Vitamins-Minerals (HAIR/SKIN/NAILS PO)   Oral   Take by mouth daily.         Marland Kitchen omeprazole (PRILOSEC) 20 MG capsule   Oral   Take 20 mg by mouth 2 (two) times daily before a meal.         . oxyCODONE (OXY IR/ROXICODONE) 5 MG immediate release tablet   Oral   Take 1-2 tablets (5-10 mg total) by mouth every 4 (four) hours as needed for severe pain.   40 tablet   0   . pramipexole (MIRAPEX) 0.5 MG tablet   Oral   Take 0.5 mg by mouth at bedtime.          . Red Yeast Rice 600 MG TABS   Oral   Take 1,200 mg by mouth. 2 daily         . traMADol (ULTRAM) 50 MG tablet   Oral   Take 1-2 tablets (50-100 mg total) by mouth every 4 (four) hours as needed for moderate pain.   40 tablet   1   . Turmeric 500 MG CAPS   Oral   Take by  mouth every morning.            Allergies Erythromycin; Penicillins; and Tetracyclines & related   Family History  Problem Relation Age of Onset  . Diabetes Mother   . Ovarian cancer Paternal Aunt   . Cancer - Lung Paternal Uncle   . Diabetes Maternal Grandfather   . Hypertension Maternal Grandfather     Social History Social History  Substance Use Topics  . Smoking status: Never Smoker   . Smokeless tobacco: Never Used  . Alcohol Use: No    Review of Systems  Constitutional:   No fever or chills. No weight changes Eyes:   No blurry vision or double vision.  ENT:   No sore throat.  Cardiovascular:   No chest pain. Respiratory:   No dyspnea or cough. Gastrointestinal:   Negative for abdominal pain, vomiting and diarrhea.  No BRBPR or melena. Genitourinary:   Negative for dysuria or difficulty urinating. Musculoskeletal:   Severe left hip pain. Skin:   Negative for rash. Neurological:   Negative for headaches, focal weakness or numbness. Psychiatric:  No anxiety or depression.   Endocrine:  No changes in energy or sleep difficulty.  10-point ROS otherwise negative.  ____________________________________________   PHYSICAL EXAM:  VITAL SIGNS: ED Triage Vitals  Enc Vitals Group     BP 08/08/15 1844 143/72 mmHg     Pulse Rate 08/08/15 1844 89     Resp 08/08/15 1844 18     Temp 08/08/15 1844 98.3 F (36.8 C)     Temp Source 08/08/15 1844 Oral     SpO2 08/08/15 1844 100 %     Weight 08/08/15 1844 185 lb (83.915 kg)     Height 08/08/15 1844 5\' 6"  (1.676 m)     Head Cir --      Peak Flow --      Pain Score 08/08/15 1847 10     Pain Loc --      Pain Edu? --      Excl. in [place]? --  Vital signs reviewed, nursing assessments reviewed.   Constitutional:   Alert and oriented. Very uncomfortable  Eyes:   No scleral icterus. No conjunctival pallor. PERRL. EOMI ENT   Head:   Normocephalic and atraumatic.   Nose:   No congestion/rhinnorhea. No septal  hematoma   Mouth/Throat:   MMM, no pharyngeal erythema. No peritonsillar mass.    Neck:   No stridor. No SubQ emphysema. No meningismus. Hematological/Lymphatic/Immunilogical:   No cervical lymphadenopathy. Cardiovascular:   RRR. Symmetric bilateral radial and DP pulses.  No murmurs. Good DP and PT pulse in the left foot, skin is warm. Respiratory:   Normal respiratory effort without tachypnea nor retractions. Breath sounds are clear and equal bilaterally. No wheezes/rales/rhonchi. Gastrointestinal:   Soft and nontender. Non distended. There is no CVA tenderness.  No rebound, rigidity, or guarding. Genitourinary:   deferred Musculoskeletal:   Left leg held in flexion and abduction. The prosthetic head is palpable posterior to the acetabulum. There is soft tissue tenderness in the area around the acetabulum as well.. Neurologic:   Normal speech and language.  CN 2-10 normal. Motor grossly intact. Intact sensation in the left foot No gross focal neurologic deficits are appreciated.  Skin:    Skin is warm, dry and intact. No rash noted.  No petechiae, purpura, or bullae. Psychiatric:   Mood and affect are normal. ____________________________________________    LABS (pertinent positives/negatives) (all labs ordered are listed, but only abnormal results are displayed) Labs Reviewed  BASIC METABOLIC PANEL  CBC WITH DIFFERENTIAL/PLATELET   ____________________________________________   EKG    ____________________________________________    RADIOLOGY  Posterior dislocation of prosthetic left hip. No fractures  ____________________________________________   PROCEDURES   ____________________________________________   INITIAL IMPRESSION / ASSESSMENT AND PLAN / ED COURSE  Pertinent labs & imaging results that were available during my care of the patient were reviewed by me and considered in my medical decision making (see chart for details).  Patient presents with  sudden left hip pain while bending over. Found to have dislocation of her prosthetic left hip. I discussed the case with her orthopedic surgeon, Dr. Marry Guan, who will plan for a close reduction under anesthesia in the operating room. We'll send screening labs.   Patient is NPO since noon today.     ____________________________________________   FINAL CLINICAL IMPRESSION(S) / ED DIAGNOSES  Final diagnoses:  Posterior dislocation of hip, closed, left, initial encounter [facility])      Carrie Mew, MD 08/08/15 2021

## 2015-08-08 NOTE — ED Notes (Signed)
Pt to ED via EMS from home c/o left hip pain.  Pt states working at home and tripped and fell onto left hip and heard a pop.  Pt has hx of left hip replacement 4 years ago, and right hip replacement January 4th.  EMS administered 266mcg fentanyl en route.  Denies LOC, pt A&Ox4, speaking in complete and coherent sentences and in NAD at this time.

## 2015-08-08 NOTE — ED Notes (Signed)
Pt  being transported to OR.

## 2015-08-08 NOTE — H&P (Signed)
ORTHOPAEDIC H&P  PATIENT NAME: Claudia Terry DOB: 13-Apr-1946  MRN: [id number]   Chief Complaint: Left hip pain  HPI: Claudia Terry is a 70 y.o. female who complains of  left hip pain. She was apparently bending over to lift an item when she had the sudden onset of left hip pain and felt a "pop" causing her to fall. She was unable to stand or bear weight on the left leg due to the left hip pain. She is over 4 years s/p left total hip arthroplasty without any previous problems.  Past Medical History  Diagnosis Date  . Menopause   . Low back pain 11/95  . Restless leg syndrome 1999  . GERD (gastroesophageal reflux disease) 8/01     Dr. Tiffany Kocher  . Arthritis    Past Surgical History  Procedure Laterality Date  . Phlebitis groin    . Total hip arthroplasty Left 05/08/11    necrosis of hip  . Cesarean section  1972, 1975    X2  . Colonoscopy  12/06/11    normal  . Knee arthroscopy Right 11/14/2014    Procedure: ARTHROSCOPY KNEE;  Surgeon: Dereck Leep, MD;  Location: [facility];  Service: Orthopedics;  Laterality: Right;  Posterior horn menicus, anterior lateral menicus grade 3 chondromalacia  . Esophagogastroduodenoscopy (egd) with propofol N/A 02/23/2015    Procedure: ESOPHAGOGASTRODUODENOSCOPY (EGD) WITH PROPOFOL;  Surgeon: Manya Silvas, MD;  Location: [facility];  Service: Endoscopy;  Laterality: N/A;  . Tonsillectomy    . Total hip arthroplasty Right 06/14/2015    Procedure: TOTAL HIP ARTHROPLASTY;  Surgeon: Dereck Leep, MD;  Location: [facility];  Service: Orthopedics;  Laterality: Right;   Social History   Social History  . Marital Status: Married    Spouse Name: N/A  . Number of Children: N/A  . Years of Education: N/A   Social History Main Topics  . Smoking status: Never Smoker   . Smokeless tobacco: Never Used  . Alcohol Use: No  . Drug Use: No  . Sexual Activity: Yes    Birth Control/ Protection: Post-menopausal   Other Topics Concern  . None   Social  History Narrative   Family History  Problem Relation Age of Onset  . Diabetes Mother   . Ovarian cancer Paternal Aunt   . Cancer - Lung Paternal Uncle   . Diabetes Maternal Grandfather   . Hypertension Maternal Grandfather    Allergies  Allergen Reactions  . Penicillins Hives and Other (See Comments)    Has patient had a PCN reaction causing immediate rash, facial/tongue/throat swelling, SOB or lightheadedness with hypotension: No Has patient had a PCN reaction causing severe rash involving mucus membranes or skin necrosis: No Has patient had a PCN reaction that required hospitalization No Has patient had a PCN reaction occurring within the last 10 years: No If all of the above answers are "NO", then may proceed with Cephalosporin use.  . Erythromycin Rash  . Tetracyclines & Related Rash   Prior to Admission medications   Medication Sig Start Date End Date Taking? Authorizing Provider  acetaminophen (TYLENOL) 500 MG tablet Take 1,000 mg by mouth every 6 (six) hours as needed for mild pain or headache.    Yes Historical Provider, MD  atenolol (TENORMIN) 50 MG tablet Take 50 mg by mouth daily.    Yes Historical Provider, MD  Cholecalciferol (VITAMIN D3) 5000 units CAPS Take 5,000 Units by mouth daily.   Yes Historical Provider, MD  Claudia Terry  500 MG capsule Take 500 mg by mouth daily.   Yes Historical Provider, MD  lansoprazole (PREVACID) 30 MG capsule Take 30 mg by mouth daily at 12 noon.   Yes Historical Provider, MD  Melatonin 10 MG TABS Take 10 mg by mouth at bedtime.   Yes Historical Provider, MD  Multiple Vitamin (MULTIVITAMIN WITH MINERALS) TABS tablet Take 1 tablet by mouth daily.   Yes Historical Provider, MD  pramipexole (MIRAPEX) 0.5 MG tablet Take 0.5 mg by mouth at bedtime.    Yes Historical Provider, MD  Red Yeast Rice 600 MG TABS Take 1,200 mg by mouth daily.    Yes Historical Provider, MD  Turmeric 500 MG CAPS Take 500 mg by mouth daily.    Yes Historical Provider, MD   celecoxib (CELEBREX) 200 MG capsule Take 1 capsule (200 mg total) by mouth 2 (two) times daily. Patient not taking: Reported on 08/08/2015 06/16/15   Claudia Dixon, PA-C  enoxaparin (LOVENOX) 40 MG/0.4ML injection Inject 0.4 mLs (40 mg total) into the skin every 12 (twelve) hours. Patient not taking: Reported on 08/08/2015 06/16/15   Claudia Dixon, PA-C  oxyCODONE (OXY IR/ROXICODONE) 5 MG immediate release tablet Take 1-2 tablets (5-10 mg total) by mouth every 4 (four) hours as needed for severe pain. Patient not taking: Reported on 08/08/2015 06/16/15   Claudia Dixon, PA-C  traMADol (ULTRAM) 50 MG tablet Take 1-2 tablets (50-100 mg total) by mouth every 4 (four) hours as needed for moderate pain. Patient not taking: Reported on 08/08/2015 06/16/15   Claudia Dixon, PA-C   Claudia Terry 2-3 Views Left  08/08/2015  CLINICAL DATA:  c/o left hip pain. Pt states working at home this evening and tripped and fell onto left hip and heard a pop. Pt has hx of left hip replacement 4 years ago, and right hip replacement January 4th. EXAM: Claudia HIP (WITH OR WITHOUT Terry) 2-3V LEFT COMPARISON:  None. FINDINGS: The left hip total arthroplasty is dislocated with the femoral head component lying superior to the acetabular component. There is no evidence of a fracture. The orthopedic hardware is well-seated with no evidence of loosening. Right hip total arthroplasty is well aligned on the single AP view. The bones are demineralized. IMPRESSION: 1. Dislocated left hip prosthesis.  No fracture Electronically Signed   By: Lajean Manes M.D.   On: 08/08/2015 19:59    Positive ROS: All other systems have been reviewed and were otherwise negative with the exception of those mentioned in the HPI and as above.  Physical Exam: General: Alert and alert in obvious discomfort.Marland Kitchen HEENT: Atraumatic and normocephalic. Sclera are clear. Extraocular motion is intact. Oropharynx is clear with moist mucosa. Neck: Supple, nontender, good range of  motion. No JVD or carotid bruits. Lungs: Clear to auscultation bilaterally. Cardiovascular: Regular rate and rhythm with normal S1 and S2. No murmurs. No gallops or rubs. Pedal pulses are palpable bilaterally. Homans test is negative bilaterally. No significant pretibial or ankle edema. Abdomen: Soft, nontender, and nondistended. Bowel sounds are present. Skin: No lesions in the area of chief complaint Neurologic: Awake, alert, and oriented. Sensory function is grossly intact. Motor strength is felt to be 5 over 5 bilaterally. No clonus or tremor. Good motor coordination. Lymphatic: No axillary or cervical lymphadenopathy  MUSCULOSKELETAL: Examination of the left lower extremity demonstrates the hip to be shortened and internally rotated. Pain is elicited with attempted active or passive range of motion of the hip. No tenderness to the knee or  ankle.  Assessment: Dislocated left total hip arthroplasty   Plan: The findings were discussed in detail with the patient and her husband. I have recommended closed reduction under anesthesia. The usual perioperative course was discussed. The risks and benefits of surgical intervention were reviewed. The patient expressed understanding of the risks and benefits and agreed with plans for closed reduction of the dislocated left total hip arthroplasty under anesthesia.  Monicka Cyran P. Holley Bouche M.D.

## 2015-08-08 NOTE — Anesthesia Preprocedure Evaluation (Signed)
Anesthesia Evaluation  Patient identified by MRN, date of birth, ID band Patient awake    Reviewed: Allergy & Precautions, NPO status , Patient's Chart, lab work & pertinent test results  History of Anesthesia Complications Negative for: history of anesthetic complications  Airway Mallampati: II       Dental no notable dental hx. (+) Teeth Intact   Pulmonary neg pulmonary ROS,    Pulmonary exam normal        Cardiovascular Exercise Tolerance: Good hypertension,  Rhythm:Regular     Neuro/Psych    GI/Hepatic Neg liver ROS, GERD  ,  Endo/Other  negative endocrine ROS  Renal/GU negative Renal ROS     Musculoskeletal negative musculoskeletal ROS (+)   Abdominal Normal abdominal exam  (+)   Peds  Hematology negative hematology ROS (+)   Anesthesia Other Findings   Reproductive/Obstetrics                             Anesthesia Physical  Anesthesia Plan  ASA: II  Anesthesia Plan: General   Post-op Pain Management:    Induction: Intravenous  Airway Management Planned: Oral ETT  Additional Equipment:   Intra-op Plan:   Post-operative Plan: Extubation in OR  Informed Consent: I have reviewed the patients History and Physical, chart, labs and discussed the procedure including the risks, benefits and alternatives for the proposed anesthesia with the patient or authorized representative who has indicated his/her understanding and acceptance.     Plan Discussed with: CRNA, Anesthesiologist and Surgeon  Anesthesia Plan Comments:         Anesthesia Quick Evaluation

## 2015-08-09 ENCOUNTER — Other Ambulatory Visit: Payer: PPO

## 2015-08-09 ENCOUNTER — Encounter: Payer: Self-pay | Admitting: Orthopedic Surgery

## 2015-08-09 ENCOUNTER — Ambulatory Visit: Payer: PPO | Admitting: Anesthesiology

## 2015-08-09 ENCOUNTER — Ambulatory Visit: Payer: PPO

## 2015-08-09 DIAGNOSIS — T84021A Dislocation of internal left hip prosthesis, initial encounter: Secondary | ICD-10-CM | POA: Diagnosis not present

## 2015-08-09 DIAGNOSIS — K219 Gastro-esophageal reflux disease without esophagitis: Secondary | ICD-10-CM | POA: Diagnosis not present

## 2015-08-09 DIAGNOSIS — Z96641 Presence of right artificial hip joint: Secondary | ICD-10-CM | POA: Diagnosis not present

## 2015-08-09 DIAGNOSIS — G2581 Restless legs syndrome: Secondary | ICD-10-CM | POA: Diagnosis not present

## 2015-08-09 DIAGNOSIS — Z471 Aftercare following joint replacement surgery: Secondary | ICD-10-CM | POA: Diagnosis not present

## 2015-08-09 DIAGNOSIS — M545 Low back pain: Secondary | ICD-10-CM | POA: Diagnosis not present

## 2015-08-09 DIAGNOSIS — Z79891 Long term (current) use of opiate analgesic: Secondary | ICD-10-CM | POA: Diagnosis not present

## 2015-08-09 DIAGNOSIS — S73005A Unspecified dislocation of left hip, initial encounter: Secondary | ICD-10-CM | POA: Diagnosis not present

## 2015-08-09 MED ORDER — LIDOCAINE HCL (CARDIAC) 20 MG/ML IV SOLN
INTRAVENOUS | Status: DC | PRN
  Administered 2015-08-09: 50 mg via INTRAVENOUS

## 2015-08-09 MED ORDER — HYDROCODONE-ACETAMINOPHEN 5-325 MG PO TABS
1.0000 | ORAL_TABLET | ORAL | Status: DC | PRN
  Administered 2015-08-09 (×3): 2 via ORAL
  Filled 2015-08-09 (×3): qty 2

## 2015-08-09 MED ORDER — MORPHINE SULFATE (PF) 2 MG/ML IV SOLN: 1.0000 mg | INTRAVENOUS | Status: DC | PRN

## 2015-08-09 MED ORDER — ONDANSETRON HCL 4 MG PO TABS: 4.0000 mg | ORAL_TABLET | Freq: Four times a day (QID) | ORAL | Status: DC | PRN

## 2015-08-09 MED ORDER — PROPOFOL 10 MG/ML IV BOLUS
INTRAVENOUS | Status: DC | PRN
  Administered 2015-08-09: 50 ug via INTRAVENOUS
  Administered 2015-08-09: 150 ug via INTRAVENOUS

## 2015-08-09 MED ORDER — FENTANYL CITRATE (PF) 100 MCG/2ML IJ SOLN
25.0000 ug | INTRAMUSCULAR | Status: DC | PRN
  Administered 2015-08-09 (×4): 25 ug via INTRAVENOUS

## 2015-08-09 MED ORDER — HYDROCODONE-ACETAMINOPHEN 5-325 MG PO TABS: 1.0000 | ORAL_TABLET | Freq: Four times a day (QID) | ORAL | Status: DC | PRN

## 2015-08-09 MED ORDER — METOCLOPRAMIDE HCL 5 MG/ML IJ SOLN: 5.0000 mg | Freq: Three times a day (TID) | INTRAMUSCULAR | Status: DC | PRN

## 2015-08-09 MED ORDER — SODIUM CHLORIDE 0.9 % IV SOLN: INTRAVENOUS | Status: DC

## 2015-08-09 MED ORDER — FENTANYL CITRATE (PF) 100 MCG/2ML IJ SOLN
INTRAMUSCULAR | Status: AC
  Filled 2015-08-09: qty 2

## 2015-08-09 MED ORDER — ONDANSETRON HCL 4 MG/2ML IJ SOLN: 4.0000 mg | Freq: Once | INTRAMUSCULAR | Status: DC | PRN

## 2015-08-09 MED ORDER — METOCLOPRAMIDE HCL 5 MG PO TABS: 5.0000 mg | ORAL_TABLET | Freq: Three times a day (TID) | ORAL | Status: DC | PRN

## 2015-08-09 MED ORDER — ONDANSETRON HCL 4 MG/2ML IJ SOLN: 4.0000 mg | Freq: Four times a day (QID) | INTRAMUSCULAR | Status: DC | PRN

## 2015-08-09 NOTE — Progress Notes (Signed)
[employer] staff here to delivery brace.

## 2015-08-09 NOTE — Progress Notes (Signed)
Order to discharge patient to home today, spouse at the bedside,discharge instructions given per MD order, home and new medications reviewed, Rx.slip given for norco.  Patient verbalized understanding of discharge instructions given. Discharge via wheelchair with nurse tech- Hoyle Sauer.

## 2015-08-09 NOTE — Brief Op Note (Signed)
08/08/2015 - 08/09/2015  12:41 AM  PATIENT:  Claudia Terry  70 y.o. female  PRE-OPERATIVE DIAGNOSIS:  dislocated left total hip arthroplasty  POST-OPERATIVE DIAGNOSIS:  Same  PROCEDURE:  Procedure(s): CLOSED REDUCTION HIP (Left)  SURGEON:  Surgeon(s) and Role:    * Dereck Leep, MD - Primary  ASSISTANTS: none   ANESTHESIA:   general  EBL:  Total I/O In: -  Out: 350 [Urine:350]  BLOOD ADMINISTERED:none  DRAINS: none   LOCAL MEDICATIONS USED:  NONE  SPECIMEN:  No Specimen  DISPOSITION OF SPECIMEN:  N/A  COUNTS:  YES  TOURNIQUET:  * No tourniquets in log *  DICTATION: .Dragon Dictation  PLAN OF CARE: Admit for overnight observation  PATIENT DISPOSITION:  PACU - hemodynamically stable.   Delay start of Pharmacological VTE agent (>24hrs) due to surgical blood loss or risk of bleeding: not applicable

## 2015-08-09 NOTE — Transfer of Care (Signed)
Immediate Anesthesia Transfer of Care Note  Patient: Claudia Terry  Procedure(s) Performed: Procedure(s): CLOSED REDUCTION HIP (Left)  Patient Location: PACU  Anesthesia Type:General  Level of Consciousness: awake, alert , oriented and patient cooperative  Airway & Oxygen Therapy: Patient Spontanous Breathing  Post-op Assessment: Report given to RN  Post vital signs: Reviewed and stable  Last Vitals:  Filed Vitals:   08/08/15 2200 08/08/15 2230  BP: 132/66 126/64  Pulse: 78 80  Temp:    Resp:      Complications: No apparent anesthesia complications

## 2015-08-09 NOTE — Op Note (Signed)
OPERATIVE NOTE  DATE OF SURGERY:  08/09/2015  PATIENT NAME:  Claudia Terry   DOB: [birth year]  MRN: [id number]   PRE-OPERATIVE DIAGNOSIS:  Dislocated left total hip arthroplasty (posterior)  POST-OPERATIVE DIAGNOSIS:  Same  PROCEDURE:  Closed reduction of the dislocated left total hip arthroplasty   SURGEON:  Marciano Sequin., M.D.   ASSISTANT: None  ANESTHESIA: general  ESTIMATED BLOOD LOSS: None  FLUIDS REPLACED: 200 mL of crystalloid  TOURNIQUET TIME: Not applicable   DRAINS: None  IMPLANTS UTILIZED: None  INDICATIONS FOR SURGERY: Claudia Terry is a 70 y.o. year old female who sustained a posterior dislocation of the left total hip arthroplasty. After discussion of the risks and benefits of surgical intervention, the patient expressed understanding of the risks benefits and agree with plans for closed reduction of the dislocated left total hip arthroplasty under anesthesia.   PROCEDURE IN DETAIL: The patient was brought into the operating room and, after adequate general (mask) anesthesia was achieved, patient was transferred to the operating table. A timeout was performed as per usual protocol. Longitudinal traction was applied to the left hip with the leg positioned in flexion and internal rotation. The hip was then gently externally rotated and there was a palpable "clunk" with reduction of the hip. Position was confirmed using C-arm. The hip was placed in flexion and external rotation with good stability appreciated. Concentric reduction of the femoral head and the acetabular component was confirmed. A hip abduction pillow was placed.  The patient tolerated procedure well. She was transported to the recovery room in stable condition.  Claudia Terry M.D.

## 2015-08-09 NOTE — Care Management Note (Signed)
Case Management Note  Patient Details  Name: Claudia Terry MRN: [id number] Date of Birth: 23-Apr-1946  Subjective/Objective:                   Met with patient to discuss discharge planning. She plans to go home today after she receives a right leg immobilizer. She lives with her husband and states that he is very supportive. Today is their anniversary. She hopes to go home today. She has a front-wheeled walker. She has used [place] in the past but does not believe she will need it again.  Action/Plan: No current RNCM needs.   Expected Discharge Date:  08/09/15               Expected Discharge Plan:     In-House Referral:     Discharge planning Services     Post Acute Care Choice:    Choice offered to:  Patient  DME Arranged:    DME Agency:     HH Arranged:    [place] Agency:     Status of Service:  In process, will continue to follow  Medicare Important Message Given:    Date Medicare IM Given:    Medicare IM give by:    Date Additional Medicare IM Given:    Additional Medicare Important Message give by:     If discussed at [place] of Stay Meetings, dates discussed:    Additional Comments:  Marshell Garfinkel, RN 08/09/2015, 9:15 AM

## 2015-08-09 NOTE — Anesthesia Procedure Notes (Signed)
Date/Time: 08/09/2015 12:18 AM Performed by: Lendon Colonel Pre-anesthesia Checklist: Patient identified, Emergency Drugs available, Suction available, Patient being monitored and Timeout performed Patient Re-evaluated:Patient Re-evaluated prior to inductionOxygen Delivery Method: Circle system utilized Preoxygenation: Pre-oxygenation with 100% oxygen Intubation Type: IV induction Ventilation: Mask ventilation without difficulty and Mask ventilation throughout procedure

## 2015-08-09 NOTE — Discharge Instructions (Signed)
Hip Dislocation °Hip dislocation is the displacement of the "ball" at the head of your thigh bone (femur) from its socket in the hip bone (pelvis). The ball-and-socket structure of the hip joint gives it a lot of stability, while allowing it to move freely. Therefore, a lot of force is required to displace the femur from its socket. A hip dislocation is an emergency. If you believe you have dislocated your hip and cannot move your leg, call for help immediately. Do not try to move. °CAUSES °The most common cause of hip dislocation is motor vehicle accidents. However, force from falls from a height (a ladder or building), injuries from contact sports, or injuries from industrial accidents can be enough to dislocate your hip. °SYMPTOMS °A hip dislocation is very painful. If you have a dislocated hip, you will not be able to move your hip. If you have nerve damage, you may not have feeling in your lower leg, foot, or ankle.  °DIAGNOSIS °Usually, your caregiver can diagnose a hip dislocation by looking at the position of your leg. Generally, X-ray exams are done to check for fractures in your femur or pelvis. The leg of the dislocated hip will appear shorter than the other leg, and your foot will be turned inward. °TREATMENT  °Your caregiver can manipulate your bones back into the joint (reduction). If there are no other complications involved with your dislocation, such as fractures or damage to blood vessels or nerves, this procedure can be done without surgery. Before this procedure, you will be given medicine so that you will not feel pain (anesthetic). Often specialized imaging exams are done after the reduction (magnetic resonance imaging [MRI] or computed tomography [CT]) to check for loose pieces of cartilage or bone in the joint. °If a manual reduction fails or you have nerve damage, damage to your blood vessels, or bone fractures, surgery will be necessary to perform the reduction.  °HOME CARE  INSTRUCTIONS °The following measures can help to reduce pain and speed up the healing process: °· Rest your injured joint. Do not move your joint if it is painful. Also, avoid activities similar to the one that caused your injury. °· Apply ice to your injured joint for 1 to 2 days after your reduction or as directed by your caregiver. Applying ice helps to reduce inflammation and pain. °¨ Put ice in a plastic bag. °¨ Place a towel between your skin and the bag. °¨ Leave the ice on for 15 to 20 minutes at a time, every 2 hours while you are awake. °· Use crutches or a walker as directed by your caregiver. °· Exercise your hip and leg as directed by your caregiver. °· Take over-the-counter or prescription medicine for pain as directed by your caregiver. °SEEK IMMEDIATE MEDICAL CARE IF: °· Your pain becomes worse rather than better. °· You feel like your hip has become dislocated again. °MAKE SURE YOU: °· Understand these instructions. °· Will watch your condition. °· Will get help right away if you are not doing well or get worse. °  °This information is not intended to replace advice given to you by your health care provider. Make sure you discuss any questions you have with your health care provider. °  °Document Released: 02/19/2001 Document Revised: 06/17/2014 Document Reviewed: 12/27/2014 °Elsevier Interactive Patient Education ©2016 [employer] ° °

## 2015-08-10 DIAGNOSIS — M5417 Radiculopathy, lumbosacral region: Secondary | ICD-10-CM | POA: Diagnosis not present

## 2015-08-10 DIAGNOSIS — M9905 Segmental and somatic dysfunction of pelvic region: Secondary | ICD-10-CM | POA: Diagnosis not present

## 2015-08-10 DIAGNOSIS — M9903 Segmental and somatic dysfunction of lumbar region: Secondary | ICD-10-CM | POA: Diagnosis not present

## 2015-08-10 DIAGNOSIS — M9904 Segmental and somatic dysfunction of sacral region: Secondary | ICD-10-CM | POA: Diagnosis not present

## 2015-08-10 DIAGNOSIS — M25551 Pain in right hip: Secondary | ICD-10-CM | POA: Diagnosis not present

## 2015-08-10 DIAGNOSIS — M25552 Pain in left hip: Secondary | ICD-10-CM | POA: Diagnosis not present

## 2015-08-10 DIAGNOSIS — M5127 Other intervertebral disc displacement, lumbosacral region: Secondary | ICD-10-CM | POA: Diagnosis not present

## 2015-08-10 NOTE — Anesthesia Postprocedure Evaluation (Signed)
Anesthesia Post Note  Patient: Cooper Shue Pizano  Procedure(s) Performed: Procedure(s) (LRB): CLOSED REDUCTION HIP (Left)  Patient location during evaluation: PACU Anesthesia Type: General Level of consciousness: awake and alert Pain management: pain level controlled Vital Signs Assessment: post-procedure vital signs reviewed and stable Respiratory status: spontaneous breathing, nonlabored ventilation, respiratory function stable and patient connected to nasal cannula oxygen Cardiovascular status: blood pressure returned to baseline and stable Postop Assessment: no signs of nausea or vomiting Anesthetic complications: no    Last Vitals:  Filed Vitals:   08/09/15 0805 08/09/15 1256  BP: 109/59 128/73  Pulse: 72 81  Temp: 36.8 C 36.7 C  Resp: 18 18    Last Pain:  Filed Vitals:   08/09/15 1256  PainSc: Asleep                 Martha Clan

## 2015-08-14 DIAGNOSIS — M25552 Pain in left hip: Secondary | ICD-10-CM | POA: Diagnosis not present

## 2015-08-14 DIAGNOSIS — M9905 Segmental and somatic dysfunction of pelvic region: Secondary | ICD-10-CM | POA: Diagnosis not present

## 2015-08-14 DIAGNOSIS — M25551 Pain in right hip: Secondary | ICD-10-CM | POA: Diagnosis not present

## 2015-08-14 DIAGNOSIS — M9903 Segmental and somatic dysfunction of lumbar region: Secondary | ICD-10-CM | POA: Diagnosis not present

## 2015-08-14 DIAGNOSIS — M9904 Segmental and somatic dysfunction of sacral region: Secondary | ICD-10-CM | POA: Diagnosis not present

## 2015-08-14 DIAGNOSIS — M5417 Radiculopathy, lumbosacral region: Secondary | ICD-10-CM | POA: Diagnosis not present

## 2015-08-14 DIAGNOSIS — M5127 Other intervertebral disc displacement, lumbosacral region: Secondary | ICD-10-CM | POA: Diagnosis not present

## 2015-08-15 DIAGNOSIS — M25551 Pain in right hip: Secondary | ICD-10-CM | POA: Diagnosis not present

## 2015-08-15 DIAGNOSIS — M25552 Pain in left hip: Secondary | ICD-10-CM | POA: Diagnosis not present

## 2015-08-15 DIAGNOSIS — M9903 Segmental and somatic dysfunction of lumbar region: Secondary | ICD-10-CM | POA: Diagnosis not present

## 2015-08-15 DIAGNOSIS — M9905 Segmental and somatic dysfunction of pelvic region: Secondary | ICD-10-CM | POA: Diagnosis not present

## 2015-08-15 DIAGNOSIS — M9904 Segmental and somatic dysfunction of sacral region: Secondary | ICD-10-CM | POA: Diagnosis not present

## 2015-08-15 DIAGNOSIS — M5417 Radiculopathy, lumbosacral region: Secondary | ICD-10-CM | POA: Diagnosis not present

## 2015-08-15 DIAGNOSIS — M5127 Other intervertebral disc displacement, lumbosacral region: Secondary | ICD-10-CM | POA: Diagnosis not present

## 2015-08-17 DIAGNOSIS — M5127 Other intervertebral disc displacement, lumbosacral region: Secondary | ICD-10-CM | POA: Diagnosis not present

## 2015-08-17 DIAGNOSIS — M9904 Segmental and somatic dysfunction of sacral region: Secondary | ICD-10-CM | POA: Diagnosis not present

## 2015-08-17 DIAGNOSIS — M9903 Segmental and somatic dysfunction of lumbar region: Secondary | ICD-10-CM | POA: Diagnosis not present

## 2015-08-17 DIAGNOSIS — M5417 Radiculopathy, lumbosacral region: Secondary | ICD-10-CM | POA: Diagnosis not present

## 2015-08-17 DIAGNOSIS — M25552 Pain in left hip: Secondary | ICD-10-CM | POA: Diagnosis not present

## 2015-08-17 DIAGNOSIS — M25551 Pain in right hip: Secondary | ICD-10-CM | POA: Diagnosis not present

## 2015-08-17 DIAGNOSIS — M9905 Segmental and somatic dysfunction of pelvic region: Secondary | ICD-10-CM | POA: Diagnosis not present

## 2015-08-22 DIAGNOSIS — M5127 Other intervertebral disc displacement, lumbosacral region: Secondary | ICD-10-CM | POA: Diagnosis not present

## 2015-08-22 DIAGNOSIS — M25552 Pain in left hip: Secondary | ICD-10-CM | POA: Diagnosis not present

## 2015-08-22 DIAGNOSIS — M25551 Pain in right hip: Secondary | ICD-10-CM | POA: Diagnosis not present

## 2015-08-22 DIAGNOSIS — M9905 Segmental and somatic dysfunction of pelvic region: Secondary | ICD-10-CM | POA: Diagnosis not present

## 2015-08-22 DIAGNOSIS — M5417 Radiculopathy, lumbosacral region: Secondary | ICD-10-CM | POA: Diagnosis not present

## 2015-08-22 DIAGNOSIS — M9904 Segmental and somatic dysfunction of sacral region: Secondary | ICD-10-CM | POA: Diagnosis not present

## 2015-08-22 DIAGNOSIS — M9903 Segmental and somatic dysfunction of lumbar region: Secondary | ICD-10-CM | POA: Diagnosis not present

## 2015-08-29 DIAGNOSIS — M9904 Segmental and somatic dysfunction of sacral region: Secondary | ICD-10-CM | POA: Diagnosis not present

## 2015-08-29 DIAGNOSIS — M25551 Pain in right hip: Secondary | ICD-10-CM | POA: Diagnosis not present

## 2015-08-29 DIAGNOSIS — M9905 Segmental and somatic dysfunction of pelvic region: Secondary | ICD-10-CM | POA: Diagnosis not present

## 2015-08-29 DIAGNOSIS — M5417 Radiculopathy, lumbosacral region: Secondary | ICD-10-CM | POA: Diagnosis not present

## 2015-08-29 DIAGNOSIS — M9903 Segmental and somatic dysfunction of lumbar region: Secondary | ICD-10-CM | POA: Diagnosis not present

## 2015-08-29 DIAGNOSIS — M5127 Other intervertebral disc displacement, lumbosacral region: Secondary | ICD-10-CM | POA: Diagnosis not present

## 2015-08-29 DIAGNOSIS — M25552 Pain in left hip: Secondary | ICD-10-CM | POA: Diagnosis not present

## 2015-09-05 DIAGNOSIS — M25551 Pain in right hip: Secondary | ICD-10-CM | POA: Diagnosis not present

## 2015-09-05 DIAGNOSIS — M9905 Segmental and somatic dysfunction of pelvic region: Secondary | ICD-10-CM | POA: Diagnosis not present

## 2015-09-05 DIAGNOSIS — M25552 Pain in left hip: Secondary | ICD-10-CM | POA: Diagnosis not present

## 2015-09-05 DIAGNOSIS — M9903 Segmental and somatic dysfunction of lumbar region: Secondary | ICD-10-CM | POA: Diagnosis not present

## 2015-09-05 DIAGNOSIS — M9904 Segmental and somatic dysfunction of sacral region: Secondary | ICD-10-CM | POA: Diagnosis not present

## 2015-09-05 DIAGNOSIS — M5127 Other intervertebral disc displacement, lumbosacral region: Secondary | ICD-10-CM | POA: Diagnosis not present

## 2015-09-05 DIAGNOSIS — M5417 Radiculopathy, lumbosacral region: Secondary | ICD-10-CM | POA: Diagnosis not present

## 2015-09-07 DIAGNOSIS — S73005D Unspecified dislocation of left hip, subsequent encounter: Secondary | ICD-10-CM | POA: Diagnosis not present

## 2015-09-18 ENCOUNTER — Emergency Department: Payer: PPO

## 2015-09-18 ENCOUNTER — Emergency Department: Payer: PPO | Admitting: Anesthesiology

## 2015-09-18 ENCOUNTER — Encounter: Payer: Self-pay | Admitting: Emergency Medicine

## 2015-09-18 ENCOUNTER — Observation Stay
Admission: EM | Admit: 2015-09-18 | Discharge: 2015-09-19 | Disposition: A | Discharge status: Home / Self Care | Payer: PPO | Attending: Orthopedic Surgery | Admitting: Orthopedic Surgery

## 2015-09-18 ENCOUNTER — Encounter
Admission: EM | Disposition: A | Discharge status: Home / Self Care | Payer: Self-pay | Source: Home / Self Care | Attending: Emergency Medicine

## 2015-09-18 DIAGNOSIS — M1611 Unilateral primary osteoarthritis, right hip: Secondary | ICD-10-CM | POA: Insufficient documentation

## 2015-09-18 DIAGNOSIS — Z880 Allergy status to penicillin: Secondary | ICD-10-CM | POA: Insufficient documentation

## 2015-09-18 DIAGNOSIS — S73004A Unspecified dislocation of right hip, initial encounter: Secondary | ICD-10-CM

## 2015-09-18 DIAGNOSIS — W109XXA Fall (on) (from) unspecified stairs and steps, initial encounter: Secondary | ICD-10-CM | POA: Insufficient documentation

## 2015-09-18 DIAGNOSIS — Z79899 Other long term (current) drug therapy: Secondary | ICD-10-CM | POA: Diagnosis not present

## 2015-09-18 DIAGNOSIS — Z8041 Family history of malignant neoplasm of ovary: Secondary | ICD-10-CM | POA: Insufficient documentation

## 2015-09-18 DIAGNOSIS — Z9889 Other specified postprocedural states: Secondary | ICD-10-CM

## 2015-09-18 DIAGNOSIS — S73005A Unspecified dislocation of left hip, initial encounter: Secondary | ICD-10-CM

## 2015-09-18 DIAGNOSIS — Z801 Family history of malignant neoplasm of trachea, bronchus and lung: Secondary | ICD-10-CM | POA: Insufficient documentation

## 2015-09-18 DIAGNOSIS — M25552 Pain in left hip: Secondary | ICD-10-CM | POA: Diagnosis not present

## 2015-09-18 DIAGNOSIS — T84021A Dislocation of internal left hip prosthesis, initial encounter: Secondary | ICD-10-CM | POA: Diagnosis not present

## 2015-09-18 DIAGNOSIS — Y792 Prosthetic and other implants, materials and accessory orthopedic devices associated with adverse incidents: Secondary | ICD-10-CM | POA: Diagnosis not present

## 2015-09-18 DIAGNOSIS — S73006A Unspecified dislocation of unspecified hip, initial encounter: Secondary | ICD-10-CM | POA: Diagnosis not present

## 2015-09-18 DIAGNOSIS — G2581 Restless legs syndrome: Secondary | ICD-10-CM | POA: Insufficient documentation

## 2015-09-18 DIAGNOSIS — K219 Gastro-esophageal reflux disease without esophagitis: Secondary | ICD-10-CM | POA: Insufficient documentation

## 2015-09-18 DIAGNOSIS — T148 Other injury of unspecified body region: Secondary | ICD-10-CM | POA: Diagnosis not present

## 2015-09-18 DIAGNOSIS — Z833 Family history of diabetes mellitus: Secondary | ICD-10-CM | POA: Insufficient documentation

## 2015-09-18 DIAGNOSIS — M1990 Unspecified osteoarthritis, unspecified site: Secondary | ICD-10-CM | POA: Diagnosis not present

## 2015-09-18 DIAGNOSIS — Z8249 Family history of ischemic heart disease and other diseases of the circulatory system: Secondary | ICD-10-CM | POA: Diagnosis not present

## 2015-09-18 DIAGNOSIS — Z96643 Presence of artificial hip joint, bilateral: Secondary | ICD-10-CM | POA: Insufficient documentation

## 2015-09-18 HISTORY — PX: HIP CLOSED REDUCTION: SHX983

## 2015-09-18 SURGERY — CLOSED REDUCTION, HIP
Anesthesia: General | Laterality: Left

## 2015-09-18 MED ORDER — HYDROMORPHONE HCL 1 MG/ML IJ SOLN
INTRAMUSCULAR | Status: AC
  Administered 2015-09-18: 1 mg via INTRAVENOUS
  Filled 2015-09-18: qty 1

## 2015-09-18 MED ORDER — FENTANYL CITRATE (PF) 100 MCG/2ML IJ SOLN
INTRAMUSCULAR | Status: AC
  Filled 2015-09-18: qty 2

## 2015-09-18 MED ORDER — OXYCODONE HCL 5 MG PO TABS
ORAL_TABLET | ORAL | Status: AC
  Filled 2015-09-18: qty 1

## 2015-09-18 MED ORDER — FENTANYL CITRATE (PF) 100 MCG/2ML IJ SOLN
25.0000 ug | INTRAMUSCULAR | Status: AC | PRN
  Administered 2015-09-18 (×6): 25 ug via INTRAVENOUS

## 2015-09-18 MED ORDER — ONDANSETRON HCL 4 MG/2ML IJ SOLN: 4.0000 mg | Freq: Once | INTRAMUSCULAR | Status: DC | PRN

## 2015-09-18 MED ORDER — METOCLOPRAMIDE HCL 5 MG/ML IJ SOLN
10.0000 mg | Freq: Once | INTRAMUSCULAR | Status: AC
  Administered 2015-09-18: 10 mg via INTRAVENOUS

## 2015-09-18 MED ORDER — HYDROMORPHONE HCL 1 MG/ML IJ SOLN
1.0000 mg | Freq: Once | INTRAMUSCULAR | Status: AC
  Administered 2015-09-18: 1 mg via INTRAVENOUS
  Filled 2015-09-18: qty 1

## 2015-09-18 MED ORDER — LIDOCAINE HCL (CARDIAC) 20 MG/ML IV SOLN
INTRAVENOUS | Status: DC | PRN
  Administered 2015-09-18: 50 mg via INTRAVENOUS
  Administered 2015-09-18: 30 mg via INTRAVENOUS

## 2015-09-18 MED ORDER — PROPOFOL 10 MG/ML IV BOLUS
INTRAVENOUS | Status: DC | PRN
  Administered 2015-09-18: 150 mg via INTRAVENOUS

## 2015-09-18 MED ORDER — MORPHINE SULFATE (PF) 2 MG/ML IV SOLN
2.0000 mg | INTRAVENOUS | Status: DC | PRN
Start: 2015-09-18 — End: 2015-09-19
  Administered 2015-09-18: 2 mg via INTRAVENOUS

## 2015-09-18 MED ORDER — METOCLOPRAMIDE HCL 5 MG/ML IJ SOLN
INTRAMUSCULAR | Status: AC
  Administered 2015-09-18: 10 mg via INTRAVENOUS
  Filled 2015-09-18: qty 2

## 2015-09-18 MED ORDER — SODIUM CHLORIDE 0.9 % IV SOLN
INTRAVENOUS | Status: DC
  Administered 2015-09-18 (×2): via INTRAVENOUS

## 2015-09-18 MED ORDER — PRAMIPEXOLE DIHYDROCHLORIDE 0.25 MG PO TABS
0.5000 mg | ORAL_TABLET | Freq: Every day | ORAL | Status: DC
  Administered 2015-09-18: 0.5 mg via ORAL
  Filled 2015-09-18 (×2): qty 2

## 2015-09-18 MED ORDER — HYDROMORPHONE HCL 1 MG/ML IJ SOLN
1.0000 mg | Freq: Once | INTRAMUSCULAR | Status: AC
  Administered 2015-09-18: 1 mg via INTRAVENOUS

## 2015-09-18 MED ORDER — ONDANSETRON HCL 4 MG/2ML IJ SOLN
4.0000 mg | Freq: Once | INTRAMUSCULAR | Status: AC
  Administered 2015-09-18: 4 mg via INTRAVENOUS
  Filled 2015-09-18: qty 2

## 2015-09-18 MED ORDER — OXYCODONE HCL 5 MG PO TABS
5.0000 mg | ORAL_TABLET | ORAL | Status: DC | PRN
  Administered 2015-09-18 – 2015-09-19 (×2): 10 mg via ORAL
  Administered 2015-09-19: 5 mg via ORAL
  Filled 2015-09-18 (×2): qty 2

## 2015-09-18 MED ORDER — METHOCARBAMOL 500 MG PO TABS
750.0000 mg | ORAL_TABLET | Freq: Four times a day (QID) | ORAL | Status: DC | PRN
  Filled 2015-09-18: qty 1

## 2015-09-18 SURGICAL SUPPLY — 11 items
DRSG EMULSION OIL 3X8 NADH (GAUZE/BANDAGES/DRESSINGS) IMPLANT
ELECT CAUTERY BLADE 6.4 (BLADE) IMPLANT
GLOVE BIO SURGEON STRL SZ7.5 (GLOVE) IMPLANT
GLOVE BIOGEL PI IND STRL 9 (GLOVE) IMPLANT
GLOVE BIOGEL PI INDICATOR 9 (GLOVE)
GLOVE SURG ORTHO 9.0 STRL STRW (GLOVE) IMPLANT
GOWN SPECIALTY ULTRA XL (MISCELLANEOUS) IMPLANT
KIT RM TURNOVER STRD PROC AR (KITS) IMPLANT
PACK HIP PROSTHESIS (MISCELLANEOUS) IMPLANT
PILLOW ABDUC SM (MISCELLANEOUS) IMPLANT
TRAY FOLEY W/METER SILVER 16FR (SET/KITS/TRAYS/PACK) ×2 IMPLANT

## 2015-09-18 NOTE — Anesthesia Procedure Notes (Signed)
Date/Time: 09/18/2015 9:27 PM Performed by: Lendon Colonel Pre-anesthesia Checklist: Patient identified, Emergency Drugs available, Suction available, Patient being monitored and Timeout performed Patient Re-evaluated:Patient Re-evaluated prior to inductionOxygen Delivery Method: Circle system utilized Preoxygenation: Pre-oxygenation with 100% oxygen Intubation Type: IV induction Ventilation: Mask ventilation without difficulty and Mask ventilation throughout procedure

## 2015-09-18 NOTE — Transfer of Care (Signed)
Immediate Anesthesia Transfer of Care Note  Patient: Claudia Terry  Procedure(s) Performed: Procedure(s): CLOSED REDUCTION HIP (Left)  Patient Location: PACU  Anesthesia Type:General  Level of Consciousness: awake, alert , oriented and patient cooperative  Airway & Oxygen Therapy: Patient Spontanous Breathing  Post-op Assessment: Report given to RN  Post vital signs: Reviewed and stable  Last Vitals:  Filed Vitals:   09/18/15 1945 09/18/15 2154  BP: 115/72 139/84  Pulse: 96 103  Temp:  36.4 C  Resp: 17 14    Complications: No apparent anesthesia complications

## 2015-09-18 NOTE — ED Provider Notes (Signed)
[facility] Emergency Department Provider Note  ____________________________________________   I have reviewed the triage vital signs and the nursing notes.   HISTORY  Chief Complaint Hip Pain    HPI Claudia Terry is a 70 y.o. female presents today complaining of a hip dislocation. Patient has a prosthetic hip on the left since 04/29/2013. Patient states that she had a hip dislocation on the prosthetic hip about a month and a half ago, was cleared for normal activity and then today was going up the stairs and on the first stair felt the hip pop out. She fell back but did not hit her head. No loss of consciousness. Patient is not anticoagulated. She had immediate pain to her hip which is consistent with prior dislocation. She came immediately to the emergency department. He is having ongoing pain despite fentanyl from EMS. Last by mouth was noon.    Past Medical History  Diagnosis Date  . Menopause   . Low back pain 11/95  . Restless leg syndrome 1999  . GERD (gastroesophageal reflux disease) 8/01     Dr. Tiffany Kocher  . Arthritis     Patient Active Problem List   Diagnosis Date Noted  . S/P closed reduction of dislocated total hip prosthesis 08/08/2015  . Primary osteoarthritis of right hip 06/14/2015  . S/P total hip arthroplasty 06/14/2015    Past Surgical History  Procedure Laterality Date  . Phlebitis groin    . Total hip arthroplasty Left 05/08/11    necrosis of hip  . Cesarean section  1972, 1975    X2  . Colonoscopy  12/06/11    normal  . Knee arthroscopy Right 11/14/2014    Procedure: ARTHROSCOPY KNEE;  Surgeon: Dereck Leep, MD;  Location: [facility];  Service: Orthopedics;  Laterality: Right;  Posterior horn menicus, anterior lateral menicus grade 3 chondromalacia  . Esophagogastroduodenoscopy (egd) with propofol N/A 02/23/2015    Procedure: ESOPHAGOGASTRODUODENOSCOPY (EGD) WITH PROPOFOL;  Surgeon: Manya Silvas, MD;  Location: [place];  Service: Endoscopy;  Laterality: N/A;  . Tonsillectomy    . Total hip arthroplasty Right 06/14/2015    Procedure: TOTAL HIP ARTHROPLASTY;  Surgeon: Dereck Leep, MD;  Location: [facility];  Service: Orthopedics;  Laterality: Right;  . Hip closed reduction Left 08/08/2015    Procedure: CLOSED REDUCTION HIP;  Surgeon: Dereck Leep, MD;  Location: [facility];  Service: Orthopedics;  Laterality: Left;    Current Outpatient Rx  Name  Route  Sig  Dispense  Refill  . acetaminophen (TYLENOL) 500 MG tablet   Oral   Take 1,000 mg by mouth every 6 (six) hours as needed for mild pain or headache.          Marland Kitchen atenolol (TENORMIN) 50 MG tablet   Oral   Take 50 mg by mouth daily.          . Cholecalciferol (VITAMIN D3) 5000 units CAPS   Oral   Take 5,000 Units by mouth daily.         . Cinnamon 500 MG capsule   Oral   Take 500 mg by mouth daily.         . lansoprazole (PREVACID) 30 MG capsule   Oral   Take 30 mg by mouth daily at 12 noon.         . Melatonin 10 MG TABS   Oral   Take 10 mg by mouth at bedtime.         Marland Kitchen  Multiple Vitamin (MULTIVITAMIN WITH MINERALS) TABS tablet   Oral   Take 1 tablet by mouth daily.         . pramipexole (MIRAPEX) 0.5 MG tablet   Oral   Take 0.5 mg by mouth at bedtime.          . Red Yeast Rice 600 MG TABS   Oral   Take 1,200 mg by mouth daily.          . Turmeric 500 MG CAPS   Oral   Take 500 mg by mouth daily.          Marland Kitchen HYDROcodone-acetaminophen (NORCO/VICODIN) 5-325 MG tablet   Oral   Take 1-2 tablets by mouth every 6 (six) hours as needed for moderate pain.   30 tablet   0     Allergies Penicillins; Erythromycin; and Tetracyclines & related  Family History  Problem Relation Age of Onset  . Diabetes Mother   . Ovarian cancer Paternal Aunt   . Cancer - Lung Paternal Uncle   . Diabetes Maternal Grandfather   . Hypertension Maternal Grandfather     Social History Social History  Substance Use Topics   . Smoking status: Never Smoker   . Smokeless tobacco: Never Used  . Alcohol Use: No    Review of Systems Constitutional: No fever/chills Eyes: No visual changes. ENT: No sore throat. No stiff neck no neck pain Cardiovascular: Denies chest pain. Respiratory: Denies shortness of breath. Gastrointestinal:   no vomiting.  No diarrhea.  No constipation. Genitourinary: Negative for dysuria. Musculoskeletal: Negative lower extremity swelling Skin: Negative for rash. Neurological: Negative for headaches, focal weakness or numbness. 10-point ROS otherwise negative.  ____________________________________________   PHYSICAL EXAM:  VITAL SIGNS: ED Triage Vitals  Enc Vitals Group     BP 09/18/15 1827 138/80 mmHg     Pulse Rate 09/18/15 1827 89     Resp 09/18/15 1827 24     Temp 09/18/15 1827 97.7 F (36.5 C)     Temp Source 09/18/15 1827 Oral     SpO2 09/18/15 1827 100 %     Weight 09/18/15 1827 185 lb (83.915 kg)     Height 09/18/15 1827 5\' 6"  (1.676 m)     Head Cir --      Peak Flow --      Pain Score 09/18/15 1827 10     Pain Loc --      Pain Edu? --      Excl. in National? --     Constitutional: Alert and oriented. Well appearing and in no acute distress. Eyes: Conjunctivae are normal. PERRL. EOMI. Head: Atraumatic. Nose: No congestion/rhinnorhea. Mouth/Throat: Mucous membranes are moist.  Oropharynx non-erythematous. Neck: No stridor.   Nontender with no meningismus Cardiovascular: Normal rate, regular rhythm. Grossly normal heart sounds.  Good peripheral circulation. Respiratory: Normal respiratory effort.  No retractions. Lungs CTAB. Abdominal: Soft and nontender. No distention. No guarding no rebound Back:  There is no focal tenderness or step off there is no midline tenderness there are no lesions noted. there is no CVA tenderness Musculoskeletal: Strong distal pulses, left hip held in flexion with obvious tenderness to palpation and deformity noted No joint effusions, no  DVT signs strong distal pulses no edema Neurologic:  Normal speech and language. No gross focal neurologic deficits are appreciated.  Skin:  Skin is warm, dry and intact. No rash noted. Psychiatric: Mood and affect are normal. Speech and behavior are normal.  ____________________________________________   LABS (all  labs ordered are listed, but only abnormal results are displayed)  Labs Reviewed - No data to display ____________________________________________  EKG  I personally interpreted any EKGs ordered by me or triage  ____________________________________________  RADIOLOGY  I reviewed any imaging ordered by me or triage that were performed during my shift and, if possible, patient and/or family made aware of any abnormal findings. ____________________________________________   PROCEDURES  Procedure(s) performed: None  Critical Care performed: None  ____________________________________________   INITIAL IMPRESSION / ASSESSMENT AND PLAN / ED COURSE  Pertinent labs & imaging results that were available during my care of the patient were reviewed by me and considered in my medical decision making (see chart for details).  ----------------------------------------- 7:06 PM on 09/18/2015 -----------------------------------------  Dr. Rudene Christians agrees with management and will come evaluate the patient as she had to go to the operating room last time this happened. We're keeping her under pain control ____________________________________________   FINAL CLINICAL IMPRESSION(S) / ED DIAGNOSES  Final diagnoses:  None      This chart was dictated using voice recognition software.  Despite best efforts to proofread,  errors can occur which can change meaning.     Schuyler Amor, MD 09/18/15 [place]

## 2015-09-18 NOTE — H&P (Signed)
Subjective:   Patient is a 70 y.o. female presents with left hip pain. She was in her yard stepping backwards missed a step and fell onto her buttocks and felt the hip pop out. This happened approximate 5 weeks ago and just recently got out of her brace. Her surgery is several years ago and this is her second dislocation.. Onset of symptoms was abrupt starting 3 hours ago with unchanged course since that time. The pain is located in the left hip buttock area. Patient describes the pain as sharp continuous and rated as severe. Pain has been associated with Her fall Patient dloss of consciousness Symptoms are aggravated by any movement . Symptoms improve with pain medication. Past history includesbilateral total hip replacements with now her second dislocation on the left side.  Previous studies include x-ray showing dislocated hip.  Patient Active Problem List   Diagnosis Date Noted  . S/P closed reduction of dislocated total hip prosthesis 08/08/2015  . Primary osteoarthritis of right hip 06/14/2015  . S/P total hip arthroplasty 06/14/2015   Past Medical History  Diagnosis Date  . Menopause   . Low back pain 11/95  . Restless leg syndrome 1999  . GERD (gastroesophageal reflux disease) 8/01     Dr. Tiffany Kocher  . Arthritis     Past Surgical History  Procedure Laterality Date  . Phlebitis groin    . Total hip arthroplasty Left 05/08/11    necrosis of hip  . Cesarean section  1972, 1975    X2  . Colonoscopy  12/06/11    normal  . Knee arthroscopy Right 11/14/2014    Procedure: ARTHROSCOPY KNEE;  Surgeon: Dereck Leep, MD;  Location: [facility];  Service: Orthopedics;  Laterality: Right;  Posterior horn menicus, anterior lateral menicus grade 3 chondromalacia  . Esophagogastroduodenoscopy (egd) with propofol N/A 02/23/2015    Procedure: ESOPHAGOGASTRODUODENOSCOPY (EGD) WITH PROPOFOL;  Surgeon: Manya Silvas, MD;  Location: [place];  Service: Endoscopy;  Laterality: N/A;  . Tonsillectomy     . Total hip arthroplasty Right 06/14/2015    Procedure: TOTAL HIP ARTHROPLASTY;  Surgeon: Dereck Leep, MD;  Location: [facility];  Service: Orthopedics;  Laterality: Right;  . Hip closed reduction Left 08/08/2015    Procedure: CLOSED REDUCTION HIP;  Surgeon: Dereck Leep, MD;  Location: [facility];  Service: Orthopedics;  Laterality: Left;     (Not in a hospital admission) Allergies  Allergen Reactions  . Penicillins Hives and Other (See Comments)    Has patient had a PCN reaction causing immediate rash, facial/tongue/throat swelling, SOB or lightheadedness with hypotension: No Has patient had a PCN reaction causing severe rash involving mucus membranes or skin necrosis: No Has patient had a PCN reaction that required hospitalization No Has patient had a PCN reaction occurring within the last 10 years: No If all of the above answers are "NO", then may proceed with Cephalosporin use.  . Erythromycin Rash  . Tetracyclines & Related Rash    Social History  Substance Use Topics  . Smoking status: Never Smoker   . Smokeless tobacco: Never Used  . Alcohol Use: No    Family History  Problem Relation Age of Onset  . Diabetes Mother   . Ovarian cancer Paternal Aunt   . Cancer - Lung Paternal Uncle   . Diabetes Maternal Grandfather   . Hypertension Maternal Grandfather     Review of Systems Pertinent items are noted in HPI.  Objective:   Patient Vitals for the past  8 hrs:  BP Temp Temp src Pulse Resp SpO2 Height Weight  09/18/15 1945 115/72 mmHg - - 96 17 95 % - -  09/18/15 1913 116/70 mmHg - - 78 15 100 % - -  09/18/15 1827 138/80 mmHg 97.7 F (36.5 C) Oral 89 (!) 24 100 % 5\' 6"  (1.676 m) 83.915 kg (185 lb)          BP 115/72 mmHg  Pulse 96  Temp(Src) 97.7 F (36.5 C) (Oral)  Resp 17  Ht 5\' 6"  (1.676 m)  Wt 83.915 kg (185 lb)  BMI 29.87 kg/m2  SpO2 95%  LMP 02/08/2005 (Approximate) General appearance: alert, cooperative, appears stated age and moderate  distress Lungs: clear to auscultation bilaterally Heart: regular rate and rhythm, S1, S2 normal, no murmur, click, rub or gallop Extremities: She is neurovascular intact with a flexed hip on the left side, sensation intact to the plantar and dorsal aspect of foot and able flex extend her toes, to posterior so space and posterior tibial pulses with trace edema   Data ReviewRadiology review: Dislocated hip no evidence of fracture  Assessment:   Active Problems:   * No active hospital problems. *  dislocated left total hip  plan on closed reduction under general anesthesia, then go back into her prior brace, plan on her being discharged in the morning if she is stable

## 2015-09-18 NOTE — ED Notes (Addendum)
Pt to [facility] via ems from home.  EMS report pt dislocated left hip walking up stairs, fell, no LOC, no neck or back pain.  EMS reports this occurred 6 weeks ago as well.  Pt received 23mcg of fentanyl in total with minimal pain relief.  Pt in mild distress upon arrival.  Pt reports left hip replacement about 6 years ago

## 2015-09-18 NOTE — Anesthesia Preprocedure Evaluation (Signed)
Anesthesia Evaluation  Patient identified by MRN, date of birth, ID band Patient awake    Reviewed: Allergy & Precautions, NPO status , Patient's Chart, lab work & pertinent test results  History of Anesthesia Complications Negative for: history of anesthetic complications  Airway Mallampati: III       Dental   Pulmonary neg pulmonary ROS,           Cardiovascular hypertension, Pt. on medications and Pt. on home beta blockers      Neuro/Psych negative neurological ROS     GI/Hepatic Neg liver ROS, GERD  Medicated and Controlled,  Endo/Other  negative endocrine ROS  Renal/GU negative Renal ROS     Musculoskeletal  (+) Arthritis ,   Abdominal   Peds  Hematology negative hematology ROS (+)   Anesthesia Other Findings   Reproductive/Obstetrics                             Anesthesia Physical Anesthesia Plan  ASA: II and emergent  Anesthesia Plan: General   Post-op Pain Management:    Induction: Intravenous  Airway Management Planned: LMA  Additional Equipment:   Intra-op Plan:   Post-operative Plan:   Informed Consent: I have reviewed the patients History and Physical, chart, labs and discussed the procedure including the risks, benefits and alternatives for the proposed anesthesia with the patient or authorized representative who has indicated his/her understanding and acceptance.     Plan Discussed with:   Anesthesia Plan Comments:         Anesthesia Quick Evaluation

## 2015-09-18 NOTE — Op Note (Signed)
09/18/2015  10:00 PM  PATIENT:  Claudia Terry  70 y.o. female  PRE-OPERATIVE DIAGNOSIS:  dislocated left total hip  POST-OPERATIVE DIAGNOSIS:  dislocated left total hip  PROCEDURE:  Procedure(s): CLOSED REDUCTION HIP (Left)  SURGEON: Laurene Footman, MD  ASSISTANTS: None  ANESTHESIA:   general  EBL:  Total I/O In: 600 [I.V.:600] Out: -   BLOOD ADMINISTERED:none  DRAINS: none   LOCAL MEDICATIONS USED:  NONE  SPECIMEN:  No Specimen  DISPOSITION OF SPECIMEN:  N/A  COUNTS:  NO None used case was a closed case  TOURNIQUET:  * No tourniquets in log *  IMPLANTS: None  DICTATION: .Dragon Dictation patient was brought to the operating room and after adequate anesthesia was obtained appropriate patient identification and timeout procedure completed. C-arm was brought in and with extreme flexion internal rotation and traction the hip could be brought around to the appropriate length and position such that with slow extension and external rotation the hip was palpably felt to reduce and visually on the C-arm. There was concentric reduction leg lengths were equal. Permanent C-arm view was obtained for confirmation. Patient was sent to recovery room where hip brace was applied  PLAN OF CARE: Admit for overnight observation  PATIENT DISPOSITION:  PACU - hemodynamically stable.

## 2015-09-18 NOTE — ED Notes (Signed)
Dr. Rudene Christians at bedside, per Dr. Rudene Christians, pt to go straight to OR when OR available.  Per Dr. Rudene Christians, do not undress pt in ED.

## 2015-09-18 NOTE — ED Notes (Signed)
OR at bedside to take pt to OR

## 2015-09-19 ENCOUNTER — Encounter: Payer: Self-pay | Admitting: Orthopedic Surgery

## 2015-09-19 MED ORDER — ATENOLOL 50 MG PO TABS
50.0000 mg | ORAL_TABLET | Freq: Every day | ORAL | Status: DC
  Filled 2015-09-19: qty 1

## 2015-09-19 MED ORDER — SODIUM CHLORIDE 0.9 % IV SOLN: INTRAVENOUS | Status: DC

## 2015-09-19 MED ORDER — METOCLOPRAMIDE HCL 10 MG PO TABS: 5.0000 mg | ORAL_TABLET | Freq: Three times a day (TID) | ORAL | Status: DC | PRN

## 2015-09-19 MED ORDER — ONDANSETRON HCL 4 MG PO TABS: 4.0000 mg | ORAL_TABLET | Freq: Four times a day (QID) | ORAL | Status: DC | PRN

## 2015-09-19 MED ORDER — BISACODYL 5 MG PO TBEC: 5.0000 mg | DELAYED_RELEASE_TABLET | Freq: Every day | ORAL | Status: DC | PRN

## 2015-09-19 MED ORDER — MELATONIN 10 MG PO TABS: 10.0000 mg | ORAL_TABLET | Freq: Every day | ORAL | Status: DC

## 2015-09-19 MED ORDER — PANTOPRAZOLE SODIUM 40 MG PO TBEC
40.0000 mg | DELAYED_RELEASE_TABLET | Freq: Every day | ORAL | Status: DC
  Administered 2015-09-19: 40 mg via ORAL
  Filled 2015-09-19: qty 1

## 2015-09-19 MED ORDER — ONDANSETRON HCL 4 MG/2ML IJ SOLN: 4.0000 mg | Freq: Four times a day (QID) | INTRAMUSCULAR | Status: DC | PRN

## 2015-09-19 MED ORDER — HYDROCODONE-ACETAMINOPHEN 5-325 MG PO TABS: 1.0000 | ORAL_TABLET | ORAL | Status: DC | PRN

## 2015-09-19 MED ORDER — ACETAMINOPHEN 500 MG PO TABS: 1000.0000 mg | ORAL_TABLET | Freq: Four times a day (QID) | ORAL | Status: DC | PRN

## 2015-09-19 MED ORDER — MAGNESIUM HYDROXIDE 400 MG/5ML PO SUSP: 30.0000 mL | Freq: Every day | ORAL | Status: DC | PRN

## 2015-09-19 MED ORDER — VITAMIN D 1000 UNITS PO TABS
5000.0000 [IU] | ORAL_TABLET | Freq: Every day | ORAL | Status: DC
  Administered 2015-09-19: 5000 [IU] via ORAL
  Filled 2015-09-19: qty 5

## 2015-09-19 MED ORDER — DOCUSATE SODIUM 100 MG PO CAPS
100.0000 mg | ORAL_CAPSULE | Freq: Two times a day (BID) | ORAL | Status: DC
  Filled 2015-09-19: qty 1

## 2015-09-19 MED ORDER — ADULT MULTIVITAMIN W/MINERALS CH
1.0000 | ORAL_TABLET | Freq: Every day | ORAL | Status: DC
  Administered 2015-09-19: 1 via ORAL
  Filled 2015-09-19: qty 1

## 2015-09-19 MED ORDER — METOCLOPRAMIDE HCL 5 MG/ML IJ SOLN: 5.0000 mg | Freq: Three times a day (TID) | INTRAMUSCULAR | Status: DC | PRN

## 2015-09-19 NOTE — Progress Notes (Signed)
Pt K 3.1, and no orders to work with PT. Claudia Terry notified of both, Per Claudia Terry pt ok to d/c.

## 2015-09-19 NOTE — Progress Notes (Signed)
   Subjective: 1 Day Post-Op Procedure(s) (LRB): CLOSED REDUCTION HIP (Left) Patient reports pain as mild.   Patient is well, and has had no acute complaints or problems We will start therapy today.  Plan is to go Home after hospital stay. no nausea and no vomiting Patient denies any chest pains or shortness of breath. Objective: Vital signs in last 24 hours: Temp:  [97.5 F (36.4 C)-98.6 F (37 C)] 98.6 F (37 C) (04/11 0640) Pulse Rate:  [74-106] 81 (04/11 0640) Resp:  [10-24] 18 (04/11 0640) BP: (91-143)/(52-106) 98/55 mmHg (04/11 0640) SpO2:  [92 %-100 %] 99 % (04/11 0640) Weight:  [83.915 kg (185 lb)] 83.915 kg (185 lb) (04/10 1827) Heels are non tender and elevated off the bed using rolled towels Intake/Output from previous day: 04/10 0701 - 04/11 0700 In: 1020 [P.O.:120; I.V.:900] Out: 200 [Urine:200] Intake/Output this shift:    No results for input(s): HGB in the last 72 hours. No results for input(s): WBC, RBC, HCT, PLT in the last 72 hours. No results for input(s): NA, K, CL, CO2, BUN, CREATININE, GLUCOSE, CALCIUM in the last 72 hours. No results for input(s): LABPT, INR in the last 72 hours.  EXAM General - Patient is Alert, Appropriate and Oriented Extremity - Neurologically intact Neurovascular intact Sensation intact distally Intact pulses distally Dorsiflexion/Plantar flexion intact Motor Function - intact, moving foot and toes well on exam.  Patient is wearing her hip spica split on the left.  Past Medical History  Diagnosis Date  . Menopause   . Low back pain 11/95  . Restless leg syndrome 1999  . GERD (gastroesophageal reflux disease) 8/01     Dr. Tiffany Kocher  . Arthritis     Assessment/Plan: 1 Day Post-Op Procedure(s) (LRB): CLOSED REDUCTION HIP (Left) Active Problems:   S/P closed reduction of dislocated total hip prosthesis  Estimated body mass index is 29.87 kg/(m^2) as calculated from the following:   Height as of this encounter: 5\' 6"   (1.676 m).   Weight as of this encounter: 83.915 kg (185 lb). Up with therapy Discharge home with home health after patient does therapy    Weight-Bearing as tolerated to left leg D/C O2 and Pulse OX and try on Room Air Discharge to home after patient has therapy.  Jillyn Ledger. [place] 09/19/2015, 7:56 AM

## 2015-09-19 NOTE — Progress Notes (Signed)
DISCHARGE NOTE:  Pt given discharge instructions and prescriptions. Pt verbalized understanding. Pt wheeled to car by staff.  

## 2015-09-19 NOTE — Discharge Instructions (Signed)
Physician Discharge Summary  Patient ID: Claudia Terry MRN: [id number] DOB/AGE: 69-Feb-1947 69 y.o.  Admit date: 09/18/2015 Discharge date: 09/19/2015  Admission Diagnoses:  Dislocated hip ([place]) [S73.006A] Hip dislocation, bilateral, initial encounter ([place]) [S73.005A, [place]   Discharge Diagnoses: Patient Active Problem List   Diagnosis Date Noted   S/P closed reduction of dislocated total hip prosthesis 08/08/2015   Primary osteoarthritis of right hip 06/14/2015   S/P total hip arthroplasty 06/14/2015    Past Medical History  Diagnosis Date   Menopause    Low back pain 11/95   Restless leg syndrome 1999   GERD (gastroesophageal reflux disease) 8/01     Dr. Tiffany Kocher   Arthritis      Transfusion:  No transfusions given doing this admission   Consultants (if any):   none  Discharged Condition: Improved  Hospital Course: LEVONE DESORBO is an 70 y.o. female who was admitted 09/18/2015 with a diagnosis of traumatic dislocation left hip and went to the operating room on 09/18/2015 - 09/19/2015 and underwent the above named procedures.    Surgeries:Procedure(s): CLOSED REDUCTION HIP on 09/18/2015 - 09/19/2015   Patient tolerated the surgery well. No complications .Patient was taken to PACU where she was stabilized and then transferred to the orthopedic floor. Patient was fitted with a hip spica split to the left leg immediately following the closed reduction.  Heels elevated off bed with rolled towels. No evidence of DVT. Calves non tender. Negative [place]. Physical therapy started on day #1 for gait training and transfer with OT starting on  day #1 for ADL and assisted devices. Patient has done well with therapy. Ambulated greater than 200 feet upon being discharged.  Patient's IV and Foley were discontinued on day #1   She was given perioperative antibiotics:  Anti-infectives    None    .  Patient was fitted with TED stockings bilaterally. She is instructed on heel  pumps. Instructed on early ambulation.  She benefited maximally from the hospital stay and there were no complications.    Recent vital signs:  Filed Vitals:   09/19/15 0435 09/19/15 0640  BP: 91/52 98/55  Pulse: 80 81  Temp: 98.2 F (36.8 C) 98.6 F (37 C)  Resp: 18 18    Recent laboratory studies:  Lab Results  Component Value Date   HGB 13.1 08/08/2015   HGB 12.1 06/16/2015   HGB 11.8* 06/15/2015   Lab Results  Component Value Date   WBC 9.5 08/08/2015   PLT 206 08/08/2015   Lab Results  Component Value Date   INR 0.94 06/07/2015   Lab Results  Component Value Date   NA 138 08/08/2015   K 3.1* 08/08/2015   CL 104 08/08/2015   CO2 25 08/08/2015   BUN 17 08/08/2015   CREATININE 0.69 08/08/2015   GLUCOSE 118* 08/08/2015    Discharge Medications:     Medication List    TAKE these medications        acetaminophen 500 MG tablet  Commonly known as:  TYLENOL  Take 1,000 mg by mouth every 6 (six) hours as needed for mild pain or headache.     atenolol 50 MG tablet  Commonly known as:  TENORMIN  Take 50 mg by mouth daily.     Cinnamon 500 MG capsule  Take 500 mg by mouth daily.     HYDROcodone-acetaminophen 5-325 MG tablet  Commonly known as:  NORCO/VICODIN  Take 1-2 tablets by mouth every 4 (four) hours as needed  for moderate pain.     lansoprazole 30 MG capsule  Commonly known as:  PREVACID  Take 30 mg by mouth daily.     Melatonin 10 MG Tabs  Take 10 mg by mouth at bedtime.     MIRAPEX 0.5 MG tablet  Generic drug:  pramipexole  Take 0.5 mg by mouth at bedtime.     multivitamin with minerals Tabs tablet  Take 1 tablet by mouth daily.     Red Yeast Rice 600 MG Tabs  Take 1,200 mg by mouth daily.     Turmeric 500 MG Caps  Take 500 mg by mouth daily.     Vitamin D3 5000 units Caps  Take 5,000 Units by mouth daily.        Diagnostic Studies: Dg C-arm 1-60 Min  09/18/2015  CLINICAL DATA:  Reduction of left hip prosthesis dislocation.  EXAM: OPERATIVE LEFT HIP (WITH PELVIS IF PERFORMED) 1 VIEW TECHNIQUE: A single fluoroscopic spot image was submitted for interpretation post-operatively. COMPARISON:  Left hip radiographs performed earlier today at 6:44 p.m. FINDINGS: A single fluoroscopic C-arm image demonstrates successful reduction of the patient's left hip prosthesis dislocation. No new fractures are seen. The distal aspect of the prosthesis is not imaged on this study. IMPRESSION: Successful reduction of left hip prosthesis dislocation. Electronically Signed   By: Garald Balding M.D.   On: 09/18/2015 22:15   Dg Hip Operative Unilat With Pelvis Left  09/18/2015  CLINICAL DATA:  Reduction of left hip prosthesis dislocation. EXAM: OPERATIVE LEFT HIP (WITH PELVIS IF PERFORMED) 1 VIEW TECHNIQUE: A single fluoroscopic spot image was submitted for interpretation post-operatively. COMPARISON:  Left hip radiographs performed earlier today at 6:44 p.m. FINDINGS: A single fluoroscopic C-arm image demonstrates successful reduction of the patient's left hip prosthesis dislocation. No new fractures are seen. The distal aspect of the prosthesis is not imaged on this study. IMPRESSION: Successful reduction of left hip prosthesis dislocation. Electronically Signed   By: Garald Balding M.D.   On: 09/18/2015 22:15   Dg Hip Unilat With Pelvis 2-3 Views Left  09/18/2015  CLINICAL DATA:  [place] today and injured left hip. EXAM: DG HIP (WITH OR WITHOUT PELVIS) 2-3V LEFT COMPARISON:  08/08/2015. FINDINGS: The left femoral prosthesis is dislocated superiorly. No fractures identified. IMPRESSION: Dislocation of the left femoral prosthesis. Electronically Signed   By: Marijo Sanes M.D.   On: 09/18/2015 19:03    Disposition: 01-Home or Self Care      Discharge Instructions    Diet - low sodium heart healthy    Complete by:  As directed      Diet - low sodium heart healthy    Complete by:  As directed      Increase activity slowly    Complete by:  As  directed      Increase activity slowly    Complete by:  As directed               Signed: Latrise Bowland R. 09/19/2015, 8:02 AM

## 2015-09-19 NOTE — Discharge Summary (Signed)
  Discharge instructions  Spica splint to be worn 24 hours per day. May weight-bear as tolerated Return to clinic in 6 weeks. We'll need to call the office for an appointment to see Dr. Marry Guan. Patient was given a prescription for Norco this fits 60. 1-2 tablets every 4-6 hours when necessary for pain Patient is placed on a regular diet

## 2015-09-19 NOTE — Anesthesia Postprocedure Evaluation (Signed)
Anesthesia Post Note  Patient: Claudia Terry  Procedure(s) Performed: Procedure(s) (LRB): CLOSED REDUCTION HIP (Left)  Patient location during evaluation: PACU Anesthesia Type: General Level of consciousness: awake and alert Pain management: pain level controlled Vital Signs Assessment: post-procedure vital signs reviewed and stable Respiratory status: spontaneous breathing and respiratory function stable Cardiovascular status: stable Anesthetic complications: no    Last Vitals:  Filed Vitals:   09/19/15 0435 09/19/15 0640  BP: 91/52 98/55  Pulse: 80 81  Temp: 36.8 C 37 C  Resp: 18 18    Last Pain:  Filed Vitals:   09/19/15 0640  PainSc: Asleep                 Ashwika Freels K

## 2015-09-21 DIAGNOSIS — M25551 Pain in right hip: Secondary | ICD-10-CM | POA: Diagnosis not present

## 2015-09-21 DIAGNOSIS — M9903 Segmental and somatic dysfunction of lumbar region: Secondary | ICD-10-CM | POA: Diagnosis not present

## 2015-09-21 DIAGNOSIS — M25552 Pain in left hip: Secondary | ICD-10-CM | POA: Diagnosis not present

## 2015-09-21 DIAGNOSIS — M5127 Other intervertebral disc displacement, lumbosacral region: Secondary | ICD-10-CM | POA: Diagnosis not present

## 2015-09-21 DIAGNOSIS — M5417 Radiculopathy, lumbosacral region: Secondary | ICD-10-CM | POA: Diagnosis not present

## 2015-09-21 DIAGNOSIS — M9905 Segmental and somatic dysfunction of pelvic region: Secondary | ICD-10-CM | POA: Diagnosis not present

## 2015-09-21 DIAGNOSIS — M9904 Segmental and somatic dysfunction of sacral region: Secondary | ICD-10-CM | POA: Diagnosis not present

## 2015-10-05 DIAGNOSIS — M9904 Segmental and somatic dysfunction of sacral region: Secondary | ICD-10-CM | POA: Diagnosis not present

## 2015-10-05 DIAGNOSIS — M9905 Segmental and somatic dysfunction of pelvic region: Secondary | ICD-10-CM | POA: Diagnosis not present

## 2015-10-05 DIAGNOSIS — M9903 Segmental and somatic dysfunction of lumbar region: Secondary | ICD-10-CM | POA: Diagnosis not present

## 2015-10-05 DIAGNOSIS — M5127 Other intervertebral disc displacement, lumbosacral region: Secondary | ICD-10-CM | POA: Diagnosis not present

## 2015-10-05 DIAGNOSIS — M25551 Pain in right hip: Secondary | ICD-10-CM | POA: Diagnosis not present

## 2015-10-05 DIAGNOSIS — M25552 Pain in left hip: Secondary | ICD-10-CM | POA: Diagnosis not present

## 2015-10-05 DIAGNOSIS — M5417 Radiculopathy, lumbosacral region: Secondary | ICD-10-CM | POA: Diagnosis not present

## 2015-10-11 DIAGNOSIS — M25552 Pain in left hip: Secondary | ICD-10-CM | POA: Diagnosis not present

## 2015-10-11 DIAGNOSIS — M25551 Pain in right hip: Secondary | ICD-10-CM | POA: Diagnosis not present

## 2015-10-11 DIAGNOSIS — M5417 Radiculopathy, lumbosacral region: Secondary | ICD-10-CM | POA: Diagnosis not present

## 2015-10-11 DIAGNOSIS — M9903 Segmental and somatic dysfunction of lumbar region: Secondary | ICD-10-CM | POA: Diagnosis not present

## 2015-10-11 DIAGNOSIS — M9904 Segmental and somatic dysfunction of sacral region: Secondary | ICD-10-CM | POA: Diagnosis not present

## 2015-10-11 DIAGNOSIS — M5127 Other intervertebral disc displacement, lumbosacral region: Secondary | ICD-10-CM | POA: Diagnosis not present

## 2015-10-11 DIAGNOSIS — M9905 Segmental and somatic dysfunction of pelvic region: Secondary | ICD-10-CM | POA: Diagnosis not present

## 2015-10-12 DIAGNOSIS — S73005D Unspecified dislocation of left hip, subsequent encounter: Secondary | ICD-10-CM | POA: Diagnosis not present

## 2015-10-16 DIAGNOSIS — S73005D Unspecified dislocation of left hip, subsequent encounter: Secondary | ICD-10-CM | POA: Diagnosis not present

## 2015-10-19 DIAGNOSIS — S73005D Unspecified dislocation of left hip, subsequent encounter: Secondary | ICD-10-CM | POA: Diagnosis not present

## 2015-10-23 DIAGNOSIS — S73005D Unspecified dislocation of left hip, subsequent encounter: Secondary | ICD-10-CM | POA: Diagnosis not present

## 2015-10-26 DIAGNOSIS — S73005D Unspecified dislocation of left hip, subsequent encounter: Secondary | ICD-10-CM | POA: Diagnosis not present

## 2015-10-30 DIAGNOSIS — S73005D Unspecified dislocation of left hip, subsequent encounter: Secondary | ICD-10-CM | POA: Diagnosis not present

## 2015-10-31 DIAGNOSIS — Z96642 Presence of left artificial hip joint: Secondary | ICD-10-CM | POA: Diagnosis not present

## 2015-11-16 DIAGNOSIS — M5127 Other intervertebral disc displacement, lumbosacral region: Secondary | ICD-10-CM | POA: Diagnosis not present

## 2015-11-16 DIAGNOSIS — M9903 Segmental and somatic dysfunction of lumbar region: Secondary | ICD-10-CM | POA: Diagnosis not present

## 2015-11-16 DIAGNOSIS — M25551 Pain in right hip: Secondary | ICD-10-CM | POA: Diagnosis not present

## 2015-11-16 DIAGNOSIS — M5417 Radiculopathy, lumbosacral region: Secondary | ICD-10-CM | POA: Diagnosis not present

## 2015-11-16 DIAGNOSIS — M9905 Segmental and somatic dysfunction of pelvic region: Secondary | ICD-10-CM | POA: Diagnosis not present

## 2015-11-16 DIAGNOSIS — M9904 Segmental and somatic dysfunction of sacral region: Secondary | ICD-10-CM | POA: Diagnosis not present

## 2015-11-16 DIAGNOSIS — M25552 Pain in left hip: Secondary | ICD-10-CM | POA: Diagnosis not present

## 2015-12-01 ENCOUNTER — Emergency Department: Payer: PPO

## 2015-12-01 ENCOUNTER — Observation Stay: Payer: PPO

## 2015-12-01 ENCOUNTER — Observation Stay
Admission: EM | Admit: 2015-12-01 | Discharge: 2015-12-02 | Disposition: A | Discharge status: Home / Self Care | Payer: PPO | Attending: Orthopedic Surgery | Admitting: Orthopedic Surgery

## 2015-12-01 ENCOUNTER — Emergency Department: Payer: PPO | Admitting: Anesthesiology

## 2015-12-01 ENCOUNTER — Encounter
Admission: EM | Disposition: A | Discharge status: Home / Self Care | Payer: Self-pay | Source: Home / Self Care | Attending: Emergency Medicine

## 2015-12-01 DIAGNOSIS — Z833 Family history of diabetes mellitus: Secondary | ICD-10-CM | POA: Insufficient documentation

## 2015-12-01 DIAGNOSIS — K219 Gastro-esophageal reflux disease without esophagitis: Secondary | ICD-10-CM | POA: Diagnosis not present

## 2015-12-01 DIAGNOSIS — S73005A Unspecified dislocation of left hip, initial encounter: Secondary | ICD-10-CM | POA: Diagnosis not present

## 2015-12-01 DIAGNOSIS — Z419 Encounter for procedure for purposes other than remedying health state, unspecified: Secondary | ICD-10-CM | POA: Diagnosis not present

## 2015-12-01 DIAGNOSIS — Z8041 Family history of malignant neoplasm of ovary: Secondary | ICD-10-CM | POA: Diagnosis not present

## 2015-12-01 DIAGNOSIS — E785 Hyperlipidemia, unspecified: Secondary | ICD-10-CM | POA: Diagnosis not present

## 2015-12-01 DIAGNOSIS — Z9889 Other specified postprocedural states: Secondary | ICD-10-CM

## 2015-12-01 DIAGNOSIS — X58XXXA Exposure to other specified factors, initial encounter: Secondary | ICD-10-CM | POA: Insufficient documentation

## 2015-12-01 DIAGNOSIS — Z96643 Presence of artificial hip joint, bilateral: Secondary | ICD-10-CM | POA: Insufficient documentation

## 2015-12-01 DIAGNOSIS — Z880 Allergy status to penicillin: Secondary | ICD-10-CM | POA: Insufficient documentation

## 2015-12-01 DIAGNOSIS — M25552 Pain in left hip: Secondary | ICD-10-CM | POA: Diagnosis not present

## 2015-12-01 DIAGNOSIS — G2581 Restless legs syndrome: Secondary | ICD-10-CM | POA: Insufficient documentation

## 2015-12-01 DIAGNOSIS — Z96649 Presence of unspecified artificial hip joint: Secondary | ICD-10-CM

## 2015-12-01 DIAGNOSIS — Z96642 Presence of left artificial hip joint: Secondary | ICD-10-CM | POA: Diagnosis not present

## 2015-12-01 DIAGNOSIS — Z79891 Long term (current) use of opiate analgesic: Secondary | ICD-10-CM | POA: Diagnosis not present

## 2015-12-01 DIAGNOSIS — T84021A Dislocation of internal left hip prosthesis, initial encounter: Secondary | ICD-10-CM | POA: Diagnosis not present

## 2015-12-01 DIAGNOSIS — M1611 Unilateral primary osteoarthritis, right hip: Secondary | ICD-10-CM | POA: Insufficient documentation

## 2015-12-01 DIAGNOSIS — Z79899 Other long term (current) drug therapy: Secondary | ICD-10-CM | POA: Insufficient documentation

## 2015-12-01 DIAGNOSIS — R52 Pain, unspecified: Secondary | ICD-10-CM | POA: Diagnosis not present

## 2015-12-01 DIAGNOSIS — Y792 Prosthetic and other implants, materials and accessory orthopedic devices associated with adverse incidents: Secondary | ICD-10-CM | POA: Diagnosis not present

## 2015-12-01 DIAGNOSIS — Z881 Allergy status to other antibiotic agents status: Secondary | ICD-10-CM | POA: Diagnosis not present

## 2015-12-01 DIAGNOSIS — T84028A Dislocation of other internal joint prosthesis, initial encounter: Secondary | ICD-10-CM

## 2015-12-01 DIAGNOSIS — I10 Essential (primary) hypertension: Secondary | ICD-10-CM | POA: Diagnosis not present

## 2015-12-01 HISTORY — PX: HIP CLOSED REDUCTION: SHX983

## 2015-12-01 LAB — CBC
HCT: 39.7 % (ref 35.0–47.0)
Hemoglobin: 13.6 g/dL (ref 12.0–16.0)
MCH: 31.4 pg (ref 26.0–34.0)
MCHC: 34.3 g/dL (ref 32.0–36.0)
MCV: 91.7 fL (ref 80.0–100.0)
Platelets: 198 10*3/uL (ref 150–440)
RBC: 4.33 MIL/uL (ref 3.80–5.20)
RDW: 13.2 % (ref 11.5–14.5)
WBC: 9.1 10*3/uL (ref 3.6–11.0)

## 2015-12-01 LAB — COMPREHENSIVE METABOLIC PANEL
ALT: 20 U/L (ref 14–54)
AST: 28 U/L (ref 15–41)
Albumin: 4.4 g/dL (ref 3.5–5.0)
Alkaline Phosphatase: 75 U/L (ref 38–126)
Anion gap: 10 (ref 5–15)
BUN: 17 mg/dL (ref 6–20)
CO2: 22 mmol/L (ref 22–32)
Calcium: 9.4 mg/dL (ref 8.9–10.3)
Chloride: 105 mmol/L (ref 101–111)
Creatinine, Ser: 0.8 mg/dL (ref 0.44–1.00)
GFR calc Af Amer: >60 mL/min (ref >60)
GFR calc non Af Amer: >60 mL/min (ref >60)
Glucose, Bld: 134 mg/dL — ABNORMAL HIGH (ref 65–99)
Potassium: 3.1 mmol/L — ABNORMAL LOW (ref 3.5–5.1)
Sodium: 137 mmol/L (ref 135–145)
Total Bilirubin: 0.4 mg/dL (ref 0.3–1.2)
Total Protein: 7.1 g/dL (ref 6.5–8.1)

## 2015-12-01 LAB — PROTIME-INR
INR: 1.08
Prothrombin Time: 14.2 seconds (ref 11.4–15.0)

## 2015-12-01 SURGERY — CLOSED REDUCTION, HIP
Anesthesia: General | Site: Hip | Laterality: Left | Wound class: Clean

## 2015-12-01 MED ORDER — HYDROCODONE-ACETAMINOPHEN 5-325 MG PO TABS: 1.0000 | ORAL_TABLET | ORAL | Status: DC | PRN

## 2015-12-01 MED ORDER — PHENYLEPHRINE HCL 10 MG/ML IJ SOLN
INTRAMUSCULAR | Status: DC | PRN
  Administered 2015-12-01: 100 ug via INTRAVENOUS

## 2015-12-01 MED ORDER — METOCLOPRAMIDE HCL 10 MG PO TABS: 5.0000 mg | ORAL_TABLET | Freq: Three times a day (TID) | ORAL | Status: DC | PRN

## 2015-12-01 MED ORDER — FENTANYL CITRATE (PF) 100 MCG/2ML IJ SOLN
INTRAMUSCULAR | Status: AC
  Administered 2015-12-01: 25 ug via INTRAVENOUS
  Filled 2015-12-01: qty 2

## 2015-12-01 MED ORDER — HYDROMORPHONE HCL 1 MG/ML IJ SOLN
INTRAMUSCULAR | Status: AC
  Administered 2015-12-01: 1 mg via INTRAVENOUS
  Filled 2015-12-01: qty 1

## 2015-12-01 MED ORDER — ATENOLOL 50 MG PO TABS
50.0000 mg | ORAL_TABLET | Freq: Every day | ORAL | Status: DC
  Filled 2015-12-01 (×2): qty 1

## 2015-12-01 MED ORDER — HYDROMORPHONE HCL 1 MG/ML IJ SOLN
1.0000 mg | Freq: Once | INTRAMUSCULAR | Status: AC
  Administered 2015-12-01: 1 mg via INTRAVENOUS

## 2015-12-01 MED ORDER — FENTANYL CITRATE (PF) 100 MCG/2ML IJ SOLN
25.0000 ug | INTRAMUSCULAR | Status: DC | PRN
  Administered 2015-12-01 (×4): 25 ug via INTRAVENOUS

## 2015-12-01 MED ORDER — ADULT MULTIVITAMIN W/MINERALS CH
1.0000 | ORAL_TABLET | Freq: Every day | ORAL | Status: DC
  Administered 2015-12-02: 1 via ORAL
  Filled 2015-12-01 (×2): qty 1

## 2015-12-01 MED ORDER — PROPOFOL 500 MG/50ML IV EMUL: INTRAVENOUS | Status: DC | PRN

## 2015-12-01 MED ORDER — MIDAZOLAM HCL 5 MG/5ML IJ SOLN
INTRAMUSCULAR | Status: DC | PRN
  Administered 2015-12-01: 1 mg via INTRAVENOUS

## 2015-12-01 MED ORDER — ONDANSETRON HCL 4 MG/2ML IJ SOLN: 4.0000 mg | Freq: Four times a day (QID) | INTRAMUSCULAR | Status: DC | PRN

## 2015-12-01 MED ORDER — SODIUM CHLORIDE 0.9 % IV SOLN
INTRAVENOUS | Status: DC
  Administered 2015-12-01: 21:00:00 via INTRAVENOUS

## 2015-12-01 MED ORDER — LACTATED RINGERS IV SOLN
INTRAVENOUS | Status: DC | PRN
  Administered 2015-12-01: 19:00:00 via INTRAVENOUS

## 2015-12-01 MED ORDER — PANTOPRAZOLE SODIUM 40 MG PO TBEC
40.0000 mg | DELAYED_RELEASE_TABLET | Freq: Every day | ORAL | Status: DC
  Administered 2015-12-01 – 2015-12-02 (×2): 40 mg via ORAL
  Filled 2015-12-01 (×2): qty 1

## 2015-12-01 MED ORDER — SODIUM CHLORIDE 0.9 % IV SOLN: INTRAVENOUS | Status: DC

## 2015-12-01 MED ORDER — VITAMIN D 1000 UNITS PO TABS
5000.0000 [IU] | ORAL_TABLET | Freq: Every day | ORAL | Status: DC
  Administered 2015-12-02: 5000 [IU] via ORAL
  Filled 2015-12-01 (×2): qty 5

## 2015-12-01 MED ORDER — METOCLOPRAMIDE HCL 5 MG/ML IJ SOLN: 5.0000 mg | Freq: Three times a day (TID) | INTRAMUSCULAR | Status: DC | PRN

## 2015-12-01 MED ORDER — MORPHINE SULFATE (PF) 2 MG/ML IV SOLN: 1.0000 mg | INTRAVENOUS | Status: DC | PRN

## 2015-12-01 MED ORDER — HYDROCODONE-ACETAMINOPHEN 5-325 MG PO TABS
1.0000 | ORAL_TABLET | ORAL | Status: DC | PRN
  Administered 2015-12-01 – 2015-12-02 (×3): 1 via ORAL
  Filled 2015-12-01 (×3): qty 1

## 2015-12-01 MED ORDER — FENTANYL CITRATE (PF) 100 MCG/2ML IJ SOLN
INTRAMUSCULAR | Status: DC | PRN
  Administered 2015-12-01: 50 ug via INTRAVENOUS

## 2015-12-01 MED ORDER — PRAMIPEXOLE DIHYDROCHLORIDE 0.25 MG PO TABS
0.5000 mg | ORAL_TABLET | Freq: Every day | ORAL | Status: DC
  Administered 2015-12-01: 0.5 mg via ORAL
  Filled 2015-12-01: qty 2

## 2015-12-01 MED ORDER — ONDANSETRON HCL 4 MG PO TABS: 4.0000 mg | ORAL_TABLET | Freq: Four times a day (QID) | ORAL | Status: DC | PRN

## 2015-12-01 MED ORDER — PROPOFOL 10 MG/ML IV BOLUS
INTRAVENOUS | Status: DC | PRN
  Administered 2015-12-01: 200 mg via INTRAVENOUS

## 2015-12-01 MED ORDER — ONDANSETRON HCL 4 MG/2ML IJ SOLN: 4.0000 mg | Freq: Once | INTRAMUSCULAR | Status: DC | PRN

## 2015-12-01 NOTE — H&P (Addendum)
ORTHOPAEDIC HISTORY & PHYSICAL  PATIENT NAME: Claudia Terry DOB: 1946-01-05  MRN: [id number]  REQUESTING PHYSICIAN: Harvest Dark, MD  Chief Complaint: Left hip pain  HPI: The patient is a 70 year old female with a previous history of left total hip arthroplasty and recurrent dislocations who presented after the sudden onset of left hip pain after trying to change her shoes. She was unable to stand or bear weight due to the left hip pain.  Past Medical History  Diagnosis Date  . Menopause   . Low back pain 11/95  . Restless leg syndrome 1999  . GERD (gastroesophageal reflux disease) 8/01     Dr. Tiffany Kocher  . Arthritis    Past Surgical History  Procedure Laterality Date  . Phlebitis groin    . Total hip arthroplasty Left 05/08/11    necrosis of hip  . Cesarean section  1972, 1975    X2  . Colonoscopy  12/06/11    normal  . Knee arthroscopy Right 11/14/2014    Procedure: ARTHROSCOPY KNEE;  Surgeon: Dereck Leep, MD;  Location: [facility];  Service: Orthopedics;  Laterality: Right;  Posterior horn menicus, anterior lateral menicus grade 3 chondromalacia  . Esophagogastroduodenoscopy (egd) with propofol N/A 02/23/2015    Procedure: ESOPHAGOGASTRODUODENOSCOPY (EGD) WITH PROPOFOL;  Surgeon: Manya Silvas, MD;  Location: [place];  Service: Endoscopy;  Laterality: N/A;  . Tonsillectomy    . Total hip arthroplasty Right 06/14/2015    Procedure: TOTAL HIP ARTHROPLASTY;  Surgeon: Dereck Leep, MD;  Location: [facility];  Service: Orthopedics;  Laterality: Right;  . Hip closed reduction Left 08/08/2015    Procedure: CLOSED REDUCTION HIP;  Surgeon: Dereck Leep, MD;  Location: [facility];  Service: Orthopedics;  Laterality: Left;  . Hip closed reduction Left 09/18/2015    Procedure: CLOSED REDUCTION HIP;  Surgeon: Hessie Knows, MD;  Location: [facility];  Service: Orthopedics;  Laterality: Left;   Social History   Social History  . Marital Status: Married    Spouse Name: N/A  .  Number of Children: N/A  . Years of Education: N/A   Social History Main Topics  . Smoking status: Never Smoker   . Smokeless tobacco: Never Used  . Alcohol Use: No  . Drug Use: No  . Sexual Activity: Yes    Birth Control/ Protection: Post-menopausal   Other Topics Concern  . None   Social History Narrative   Family History  Problem Relation Age of Onset  . Diabetes Mother   . Ovarian cancer Paternal Aunt   . Cancer - Lung Paternal Uncle   . Diabetes Maternal Grandfather   . Hypertension Maternal Grandfather    Allergies  Allergen Reactions  . Penicillins Hives and Other (See Comments)    Has patient had a PCN reaction causing immediate rash, facial/tongue/throat swelling, SOB or lightheadedness with hypotension: No Has patient had a PCN reaction causing severe rash involving mucus membranes or skin necrosis: No Has patient had a PCN reaction that required hospitalization No Has patient had a PCN reaction occurring within the last 10 years: No If all of the above answers are "NO", then may proceed with Cephalosporin use.  . Erythromycin Rash  . Tetracyclines & Related Rash   Prior to Admission medications   Medication Sig Start Date End Date Taking? Authorizing Provider  acetaminophen (TYLENOL) 500 MG tablet Take 1,000 mg by mouth every 6 (six) hours as needed for mild pain or headache.     Historical  Provider, MD  atenolol (TENORMIN) 50 MG tablet Take 50 mg by mouth daily.     Historical Provider, MD  Cholecalciferol (VITAMIN D3) 5000 units CAPS Take 5,000 Units by mouth daily.    Historical Provider, MD  Cinnamon 500 MG capsule Take 500 mg by mouth daily.    Historical Provider, MD  HYDROcodone-acetaminophen (NORCO/VICODIN) 5-325 MG tablet Take 1-2 tablets by mouth every 4 (four) hours as needed for moderate pain. 09/19/15   Watt Climes, PA  lansoprazole (PREVACID) 30 MG capsule Take 30 mg by mouth daily.     Historical Provider, MD  Melatonin 10 MG TABS Take 10 mg by  mouth at bedtime.    Historical Provider, MD  Multiple Vitamin (MULTIVITAMIN WITH MINERALS) TABS tablet Take 1 tablet by mouth daily.    Historical Provider, MD  omeprazole (PRILOSEC) 20 MG capsule Take 20 mg by mouth 2 (two) times daily. 11/13/15   Historical Provider, MD  pramipexole (MIRAPEX) 0.5 MG tablet Take 0.5 mg by mouth at bedtime.     Historical Provider, MD  Red Yeast Rice 600 MG TABS Take 1,200 mg by mouth daily.     Historical Provider, MD  Turmeric 500 MG CAPS Take 500 mg by mouth daily.     Historical Provider, MD   Dg Hip Unilat With Pelvis 2-3 Views Left  12/01/2015  CLINICAL DATA:  Left hip dislocation EXAM: DG HIP (WITH OR WITHOUT PELVIS) 2-3V LEFT COMPARISON:  09/18/2015 hip radiograph FINDINGS: There is superolateral dislocation of the left femoral head prosthesis at the left hip joint. Bilateral total hip arthroplasty. No evidence of hardware fracture or loosening. No osseous fracture or suspicious focal osseous lesion. Deformities in the right pubic rami appear healed. IMPRESSION: Superolateral dislocation of the left femoral head prosthesis at the left hip joint. No hardware or osseous fracture. Electronically Signed   By: Ilona Sorrel M.D.   On: 12/01/2015 15:42    Positive ROS: All other systems have been reviewed and were otherwise negative with the exception of those mentioned in the HPI and as above.  Physical Exam: General: Alert and alert in obvious discomfort. HEENT: Atraumatic and normocephalic. Sclera are clear. Extraocular motion is intact. Oropharynx is clear with moist mucosa. Neck: Supple, nontender, good range of motion. No JVD or carotid bruits. Lungs: Clear to auscultation bilaterally. Cardiovascular: Regular rate and rhythm with normal S1 and S2. No murmurs. No gallops or rubs. Pedal pulses are palpable bilaterally. Homans test is negative bilaterally. No significant pretibial or ankle edema. Abdomen: Soft, nontender, and nondistended. Bowel sounds are  present. Skin: No lesions in the area of chief complaint Neurologic: Awake, alert, and oriented. Sensory function is grossly intact. Motor strength is felt to be 5 over 5 bilaterally. No clonus or tremor. Good motor coordination. Lymphatic: No axillary or cervical lymphadenopathy  MUSCULOSKELETAL: The left lower extremity is shortened and rotated. Pain with attempted range of motion of the left hip. No significant knee pain or swelling.  Assessment: Dislocated left total hip arthroplasty, recurrent  Plan: The findings were discussed in detail with the patient. I have recommended closed reduction under anesthesia. The usual perioperative course was discussed. The risks and benefits of surgical intervention were reviewed. The patient expressed understanding of the risks and benefits and agreed with plans for surgical intervention.   The surgical site was signed as per the "right site surgery" protocol.   Levette Paulick P. Holley Bouche M.D.

## 2015-12-01 NOTE — Anesthesia Procedure Notes (Signed)
Procedure Name: LMA Insertion Date/Time: 12/01/2015 7:04 PM Performed by: Kennon Holter Pre-anesthesia Checklist: Patient identified, Patient being monitored, Timeout performed, Emergency Drugs available and Suction available Patient Re-evaluated:Patient Re-evaluated prior to inductionOxygen Delivery Method: Circle system utilized Preoxygenation: Pre-oxygenation with 100% oxygen Intubation Type: IV induction Ventilation: Mask ventilation without difficulty LMA: LMA inserted LMA Size: 4.0 Tube type: Oral Number of attempts: 1 Placement Confirmation: positive ETCO2 and breath sounds checked- equal and bilateral Tube secured with: Tape Dental Injury: Teeth and Oropharynx as per pre-operative assessment

## 2015-12-01 NOTE — ED Provider Notes (Signed)
[facility] Emergency Department Provider Note  Time seen: 3:35 PM  I have reviewed the triage vital signs and the nursing notes.   HISTORY  Chief Complaint Hip Pain    HPI Claudia Terry is a 70 y.o. female with a past medical history of gastric reflux, arthritis, hip replacement who presents the emergency department with acute onset of left hip pain. According to the patient she is attempting to take her shoe off when she felt sudden pain in the left hip and felt a pop. Patient states it feels like her hip is out of place. This is the third time this has occurred. Patient states she was told by her orthopedic physician Dr. Bess Harvest that it needs to be reduced in the operating room. The last 2 times has been reduced in the operating room. She would like [place] to discuss this with Dr. Bess Harvest. Patient describes her pain as a 10/10, status post 300 g of fentanyl.Denies any trauma.     Past Medical History  Diagnosis Date  . Menopause   . Low back pain 11/95  . Restless leg syndrome 1999  . GERD (gastroesophageal reflux disease) 8/01     Dr. Tiffany Kocher  . Arthritis     Patient Active Problem List   Diagnosis Date Noted  . S/P closed reduction of dislocated total hip prosthesis 08/08/2015  . Primary osteoarthritis of right hip 06/14/2015  . S/P total hip arthroplasty 06/14/2015    Past Surgical History  Procedure Laterality Date  . Phlebitis groin    . Total hip arthroplasty Left 05/08/11    necrosis of hip  . Cesarean section  1972, 1975    X2  . Colonoscopy  12/06/11    normal  . Knee arthroscopy Right 11/14/2014    Procedure: ARTHROSCOPY KNEE;  Surgeon: Dereck Leep, MD;  Location: [facility];  Service: Orthopedics;  Laterality: Right;  Posterior horn menicus, anterior lateral menicus grade 3 chondromalacia  . Esophagogastroduodenoscopy (egd) with propofol N/A 02/23/2015    Procedure: ESOPHAGOGASTRODUODENOSCOPY (EGD) WITH PROPOFOL;  Surgeon: Manya Silvas, MD;  Location: [place];  Service: Endoscopy;  Laterality: N/A;  . Tonsillectomy    . Total hip arthroplasty Right 06/14/2015    Procedure: TOTAL HIP ARTHROPLASTY;  Surgeon: Dereck Leep, MD;  Location: [facility];  Service: Orthopedics;  Laterality: Right;  . Hip closed reduction Left 08/08/2015    Procedure: CLOSED REDUCTION HIP;  Surgeon: Dereck Leep, MD;  Location: [facility];  Service: Orthopedics;  Laterality: Left;  . Hip closed reduction Left 09/18/2015    Procedure: CLOSED REDUCTION HIP;  Surgeon: Hessie Knows, MD;  Location: [facility];  Service: Orthopedics;  Laterality: Left;    Current Outpatient Rx  Name  Route  Sig  Dispense  Refill  . acetaminophen (TYLENOL) 500 MG tablet   Oral   Take 1,000 mg by mouth every 6 (six) hours as needed for mild pain or headache.          Marland Kitchen atenolol (TENORMIN) 50 MG tablet   Oral   Take 50 mg by mouth daily.          . Cholecalciferol (VITAMIN D3) 5000 units CAPS   Oral   Take 5,000 Units by mouth daily.         . Cinnamon 500 MG capsule   Oral   Take 500 mg by mouth daily.         Marland Kitchen HYDROcodone-acetaminophen (NORCO/VICODIN) 5-325 MG tablet  Oral   Take 1-2 tablets by mouth every 4 (four) hours as needed for moderate pain.   60 tablet   0   . lansoprazole (PREVACID) 30 MG capsule   Oral   Take 30 mg by mouth daily.          . Melatonin 10 MG TABS   Oral   Take 10 mg by mouth at bedtime.         . Multiple Vitamin (MULTIVITAMIN WITH MINERALS) TABS tablet   Oral   Take 1 tablet by mouth daily.         . pramipexole (MIRAPEX) 0.5 MG tablet   Oral   Take 0.5 mg by mouth at bedtime.          . Red Yeast Rice 600 MG TABS   Oral   Take 1,200 mg by mouth daily.          . Turmeric 500 MG CAPS   Oral   Take 500 mg by mouth daily.            Allergies Penicillins; Erythromycin; and Tetracyclines & related  Family History  Problem Relation Age of Onset  . Diabetes Mother   . Ovarian  cancer Paternal Aunt   . Cancer - Lung Paternal Uncle   . Diabetes Maternal Grandfather   . Hypertension Maternal Grandfather     Social History Social History  Substance Use Topics  . Smoking status: Never Smoker   . Smokeless tobacco: Never Used  . Alcohol Use: No    Review of Systems Constitutional: Negative for fever. Cardiovascular: Negative for chest pain. Respiratory: Negative for shortness of breath. Gastrointestinal: Negative for abdominal pain Musculoskeletal: Left hip pain Neurological: Negative for headache 10-point ROS otherwise negative.  ____________________________________________   PHYSICAL EXAM:  VITAL SIGNS: ED Triage Vitals  Enc Vitals Group     BP 12/01/15 1444 123/64 mmHg     Pulse Rate 12/01/15 1444 89     Resp 12/01/15 1444 18     Temp 12/01/15 1444 98.6 F (37 C)     Temp Source 12/01/15 1444 Oral     SpO2 12/01/15 1444 95 %     Weight 12/01/15 1444 180 lb (81.647 kg)     Height 12/01/15 1444 5\' 6"  (1.676 m)     Head Cir --      Peak Flow --      Pain Score 12/01/15 1444 10     Pain Loc --      Pain Edu? --      Excl. in [place]? --     Constitutional: Alert and oriented. Moderate distress due to pain. Tearful. Eyes: Normal exam ENT   Head: Normocephalic and atraumatic.   Mouth/Throat: Mucous membranes are moist. Cardiovascular: Normal rate, regular rhythm. No murmur Respiratory: Normal respiratory effort without tachypnea nor retractions. Breath sounds are clear  Gastrointestinal: Soft and nontender. No distention. Musculoskeletal: Significant left hip tenderness to palpation. Warm distally. Neurovascular intact. Neurologic:  Normal speech and language. No gross focal neurologic deficits  Skin:  Skin is warm, dry and intact.  Psychiatric: Mood and affect are normal.   ____________________________________________   RADIOLOGY  Left hip dislocation  ____________________________________________    INITIAL IMPRESSION /  ASSESSMENT AND PLAN / ED COURSE  Pertinent labs & imaging results that were available during my care of the patient were reviewed by me and considered in my medical decision making (see chart for details).  Patient presents the emergency department left  hip pain, felt a pop, states this is very consistent with past dislocation. We will discuss with orthopedics. We will obtain an x-ray. Patient agreeable to plan. We will dose Dilaudid for pain control.  I discussed the patient and Dr. Bess Harvest, who will arrange for the patient to be sent to the operating room for reduction. Patient remains in pain, we will continue to dose pain medication on the emergency department. Patient's last oral intake was at 11:00 in the morning.  ____________________________________________   FINAL CLINICAL IMPRESSION(S) / ED DIAGNOSES  Left hip dislocation   Harvest Dark, MD 12/01/15 1630

## 2015-12-01 NOTE — ED Notes (Signed)
Pt bib EMS w/ c/o Left hip pain.  Pt has had dislocated L hip before, r/t hip replacement surgery. Pt was changing shoes, when she felt pain.  A/Ox4.  Pulses palpable.  Resp even and unlabored.  Per EMS, she received 400 mcg of fentynl.

## 2015-12-01 NOTE — Transfer of Care (Signed)
Immediate Anesthesia Transfer of Care Note  Patient: Claudia Terry  Procedure(s) Performed: Procedure(s) with comments: CLOSED REDUCTION HIP (Left) - no prep no incision  Patient Location: PACU  Anesthesia Type:General  Level of Consciousness: awake, alert  and oriented  Airway & Oxygen Therapy: Patient Spontanous Breathing and Patient connected to face mask oxygen  Post-op Assessment: Report given to RN and Post -op Vital signs reviewed and stable  Post vital signs: Reviewed and stable  Last Vitals:  Filed Vitals:   12/01/15 1730 12/01/15 1820  BP:  131/85  Pulse: 74 87  Temp:    Resp:      Last Pain:  Filed Vitals:   12/01/15 1824  PainSc: 10-Worst pain ever         Complications: No apparent anesthesia complications

## 2015-12-01 NOTE — Anesthesia Preprocedure Evaluation (Signed)
Anesthesia Evaluation  Patient identified by MRN, date of birth, ID band Patient awake    Reviewed: Allergy & Precautions, H&P , NPO status , Patient's Chart, lab work & pertinent test results, reviewed documented beta blocker date and time   Airway Mallampati: II  TM Distance: >3 FB Neck ROM: full    Dental  (+) Teeth Intact   Pulmonary neg pulmonary ROS,    Pulmonary exam normal        Cardiovascular Exercise Tolerance: Good negative cardio ROS Normal cardiovascular exam Rate:Normal     Neuro/Psych negative neurological ROS  negative psych ROS   GI/Hepatic negative GI ROS, Neg liver ROS, GERD  Medicated,  Endo/Other  negative endocrine ROS  Renal/GU negative Renal ROS  negative genitourinary   Musculoskeletal   Abdominal   Peds  Hematology negative hematology ROS (+)   Anesthesia Other Findings   Reproductive/Obstetrics negative OB ROS                             Anesthesia Physical Anesthesia Plan  ASA: III and emergent  Anesthesia Plan: General LMA   Post-op Pain Management:    Induction:   Airway Management Planned:   Additional Equipment:   Intra-op Plan:   Post-operative Plan:   Informed Consent: I have reviewed the patients History and Physical, chart, labs and discussed the procedure including the risks, benefits and alternatives for the proposed anesthesia with the patient or authorized representative who has indicated his/her understanding and acceptance.     Plan Discussed with: CRNA  Anesthesia Plan Comments:         Anesthesia Quick Evaluation

## 2015-12-01 NOTE — Op Note (Signed)
OPERATIVE NOTE  DATE OF SURGERY:  12/01/2015  PATIENT NAME:  Claudia Terry   DOB: [birth year]  MRN: [id number]   PRE-OPERATIVE DIAGNOSIS:  Recurrent dislocation of the left total hip arthroplasty  POST-OPERATIVE DIAGNOSIS:  Same  PROCEDURE:  Closed reduction of the dislocated left total hip arthroplasty  SURGEON:  Marciano Sequin., M.D.   ASSISTANT: None  ANESTHESIA: general  ESTIMATED BLOOD LOSS: None  FLUIDS REPLACED: 600 mL of crystalloid  TOURNIQUET TIME: Not applicable   DRAINS: None  IMPLANTS UTILIZED: Not applicable  INDICATIONS FOR SURGERY: Claudia Terry is a 70 y.o. year old female who has been seen for complaints of left hip pain. She had sustained a recurrent posterior dislocation of the left total hip arthroplasty while attempting to change her shoes. After discussion of the risks and benefits of surgical intervention, the patient expressed understanding of the risks benefits and agree with plans for closed reduction of the dislocated left total hip arthroplasty under anesthesia.   PROCEDURE IN DETAIL: The patient was brought into the operating room and, after adequate general anesthesia was achieved, the patient was transferred to the OR table. A timeout was performed as per usual protocol. The pelvis was stabilized and the hip was flexed, internally rotated, and traction was applied. There was a palpable "clunk". The hip was then visualized using the C-arm and concentric reduction of the femoral head was noted. The hip was placed through a range of motion with multiple images obtained using the C-arm with good maintenance of the reduction noted.  The patient tolerated the procedure well. She was transported to the recovery room in stable condition.  James P. Holley Bouche M.D.

## 2015-12-01 NOTE — Brief Op Note (Signed)
12/01/2015  7:43 PM  PATIENT:  Dorris Fetch Chevere  70 y.o. female  PRE-OPERATIVE DIAGNOSIS:  recurrent dislocated left total hip arthroplasty  POST-OPERATIVE DIAGNOSIS:  recurrent dislocated left total hip arthroplasty  PROCEDURE:  Procedure(s) with comments: CLOSED REDUCTION HIP (Left) - no prep no incision  SURGEON:  Surgeon(s) and Role:    * Dereck Leep, MD - Primary  ASSISTANTS: none   ANESTHESIA:   general  EBL:  Total I/O In: 600 [I.V.:600] Out: 0   BLOOD ADMINISTERED:none  DRAINS: none   LOCAL MEDICATIONS USED:  NONE  SPECIMEN:  No Specimen  DISPOSITION OF SPECIMEN:  N/A  COUNTS:  YES  TOURNIQUET:  * No tourniquets in log *  DICTATION: .Dragon Dictation  PLAN OF CARE: Admit for overnight observation  PATIENT DISPOSITION:  PACU - hemodynamically stable.   Delay start of Pharmacological VTE agent (>24hrs) due to surgical blood loss or risk of bleeding: not applicable

## 2015-12-02 NOTE — Progress Notes (Signed)
   Subjective: 1 Day Post-Op Procedure(s) (LRB): CLOSED REDUCTION HIP (Left) Patient reports pain as mild.   Patient is well, and has had no acute complaints or problems Plan is to go Home after hospital stay. no nausea and no vomiting Patient denies any chest pains or shortness of breath. Objective: Vital signs in last 24 hours: Temp:  [97.9 F (36.6 C)-98.9 F (37.2 C)] 98.4 F (36.9 C) (06/24 0802) Pulse Rate:  [74-91] 77 (06/24 0802) Resp:  [10-23] 18 (06/24 0802) BP: (94-149)/(50-85) 106/63 mmHg (06/24 0802) SpO2:  [95 %-100 %] 100 % (06/24 0802) Weight:  [81.647 kg (180 lb)] 81.647 kg (180 lb) (06/23 2039) Pt laying in bed without hip spica splint. Pillows between knees Heels are non tender and elevated off the bed using rolled towels Intake/Output from previous day: 06/23 0701 - 06/24 0700 In: 600 [I.V.:600] Out: 1235 [Urine:1235] Intake/Output this shift: Total I/O In: -  Out: 300 [Urine:300]   Recent Labs  12/01/15 1502  HGB 13.6    Recent Labs  12/01/15 1502  WBC 9.1  RBC 4.33  HCT 39.7  PLT 198    Recent Labs  12/01/15 1502  NA 137  K 3.1*  CL 105  CO2 22  BUN 17  CREATININE 0.80  GLUCOSE 134*  CALCIUM 9.4    Recent Labs  12/01/15 1502  INR 1.08    EXAM General - Patient is Alert, Appropriate and Oriented Extremity - Neurologically intact Neurovascular intact Sensation intact distally Intact pulses distally Dorsiflexion/Plantar flexion intact Compartment soft Dressing - no wounds Motor Function - intact, moving foot and toes well on exam.    Past Medical History  Diagnosis Date  . Menopause   . Low back pain 11/95  . Restless leg syndrome 1999  . GERD (gastroesophageal reflux disease) 8/01     Dr. Tiffany Kocher  . Arthritis     Assessment/Plan: 1 Day Post-Op Procedure(s) (LRB): CLOSED REDUCTION HIP (Left) Active Problems:   Failed total hip arthroplasty with dislocation (HCC)  Estimated body mass index is 29.07 kg/(m^2)  as calculated from the following:   Height as of this encounter: 5\' 6"  (1.676 m).   Weight as of this encounter: 81.647 kg (180 lb). Advance diet Discharge home with home health  Labs: none DVT Prophylaxis - Aspirin Weight-Bearing as tolerated to left leg Hip spica splint to be worn at all times. F/u in office in 6 weeks. Sooner if there is a problem.  Jillyn Ledger. [place] Villalba 12/02/2015, 8:22 AM

## 2015-12-02 NOTE — Anesthesia Postprocedure Evaluation (Signed)
Anesthesia Post Note  Patient: Chantavia Kramarz Macapagal  Procedure(s) Performed: Procedure(s) (LRB): CLOSED REDUCTION HIP (Left)  Patient location during evaluation: PACU Anesthesia Type: General Level of consciousness: awake and alert Pain management: pain level controlled Vital Signs Assessment: post-procedure vital signs reviewed and stable Respiratory status: spontaneous breathing, nonlabored ventilation, respiratory function stable and patient connected to nasal cannula oxygen Cardiovascular status: blood pressure returned to baseline and stable Postop Assessment: no signs of nausea or vomiting Anesthetic complications: no    Last Vitals:  Filed Vitals:   12/02/15 0505 12/02/15 0802  BP: 107/57 106/63  Pulse: 79 77  Temp: 36.9 C 36.9 C  Resp: 18 18    Last Pain:  Filed Vitals:   12/02/15 0852  PainSc: [place]

## 2015-12-02 NOTE — Care Management Obs Status (Signed)
[place] NOTIFICATION   Patient Details  Name: Claudia Terry MRN: [id number] Date of Birth: 09/14/1945   Medicare Observation Status Notification Given:  No (DC order written < 24 hours)    Ival Bible, RN 12/02/2015, 1:04 PM

## 2015-12-02 NOTE — Discharge Instructions (Signed)
CLOSED REDUCTION OF POSTERIOR TOTAL HIP REPLACEMENT POSTOPERATIVE DIRECTIONS  Hip Rehabilitation, Guidelines Following Surgery  The results of a hip operation are greatly improved after range of motion and muscle strengthening exercises. Follow all safety measures which are given to protect your hip. If any of these exercises cause increased pain or swelling in your joint, decrease the amount until you are comfortable again. Then slowly increase the exercises. Call your caregiver if you have problems or questions.   HOME CARE INSTRUCTIONS  Remove items at home which could result in a fall. This includes throw rugs or furniture in walking pathways.   ICE to the affected hip every three hours for 30 minutes at a time and then as needed for pain and swelling.  Continue to use ice on the hip for pain and swelling from surgery. You may notice swelling that will progress down to the foot and ankle.  This is normal after surgery.  Elevate the leg when you are not up walking on it.    Continue to use the breathing machine which will help keep your temperature down.  It is common for your temperature to cycle up and down following surgery, especially at night when you are not up moving around and exerting yourself.  The breathing machine keeps your lungs expanded and your temperature down.  DIET You may resume your previous home diet once your are discharged from the hospital.  DRESSING / WOUND  CARE / SHOWERING  SHOWER Ok to take a shower  ACTIVITY Walk with your walker as instructed. Use walker as long as suggested by your caregivers.May go to using a cane once your therapist feels that it is safe to do so. Avoid periods of inactivity such as sitting longer than an hour when not asleep. This helps prevent blood clots.  You may resume a sexual relationship in one month or when given the OK by your doctor.  You may return to work once you are cleared by your doctor.  Do not drive a car for 6  weeks or until released by you surgeon.  Do not drive while taking narcotics.   WEIGHT BEARING You may wait bear as tolerated on the surgical leg.  POSTOPERATIVE CONSTIPATION PROTOCOL Constipation - defined medically as fewer than three stools per week and severe constipation as less than one stool per week.  One of the most common issues patients have following surgery is constipation.  Even if you have a regular bowel pattern at home, your normal regimen is likely to be disrupted due to multiple reasons following surgery.  Combination of anesthesia, postoperative narcotics, change in appetite and fluid intake all can affect your bowels.  In order to avoid complications following surgery, here are some recommendations in order to help you during your recovery period.  Colace (docusate) - Pick up an over-the-counter form of Colace or another stool softener and take twice a day as long as you are requiring postoperative pain medications.  Take with a full glass of water daily.  If you experience loose stools or diarrhea, hold the colace until you stool forms back up.  If your symptoms do not get better within 1 week or if they get worse, check with your doctor.  Dulcolax (bisacodyl) - Pick up over-the-counter and take as directed by the product packaging as needed to assist with the movement of your bowels.  Take with a full glass of water.  Use this product as needed if not relieved by Colace  only.   MiraLax (polyethylene glycol) - Pick up over-the-counter to have on hand.  MiraLax is a solution that will increase the amount of water in your bowels to assist with bowel movements.  Take as directed and can mix with a glass of water, juice, soda, coffee, or tea.  Take if you go more than two days without a movement. Do not use MiraLax more than once per day. Call your doctor if you are still constipated or irregular after using this medication for 7 days in a row.  If you continue to have problems  with postoperative constipation, please contact the office for further assistance and recommendations.  If you experience "the worst abdominal pain ever" or develop nausea or vomiting, please contact the office immediatly for further recommendations for treatment.  ITCHING  If you experience itching with your medications, try taking only a single pain pill, or even half a pain pill at a time.  You can also use Benadryl over the counter for itching or also to help with sleep.     MEDICATIONS See your medication summary on the After Visit Summary that the nursing staff will review with you prior to discharge.  You may have some home medications which will be placed on hold until you complete the course of blood thinner medication.  It is important for you to complete the blood thinner medication as prescribed by your surgeon.  Continue your approved medications as instructed at time of discharge.  PRECAUTIONS If you experience chest pain or shortness of breath - call 911 immediately for transfer to the hospital emergency department.  If you develop a fever greater that 101 F, purulent drainage from wound, increased redness or drainage from wound, foul odor from the wound/dressing, or calf pain - CONTACT YOUR SURGEON.                                                   FOLLOW-UP APPOINTMENTS Make sure you keep all of your appointments after your operation with your surgeon and caregivers. You should call the office at the above phone number and make an appointment for approximately 6 weeks after the date of your surgery or on the date instructed by your surgeon outlined in the "After Visit Summary". This appointment should have already been made prior to the surgery. If you don't remember the date and time, call the office at [phone].  RANGE OF MOTION AND STRENGTHENING EXERCISES  These exercises are designed to help you keep full movement of your hip joint. Follow your caregiver's or physical  therapist's instructions. Perform all exercises about fifteen times, three times per day or as directed. Exercise both hips, even if you have had only one joint replacement. These exercises can be done on a training (exercise) mat, on the floor, on a table or on a bed. Use whatever works the best and is most comfortable for you. Use music or television while you are exercising so that the exercises are a pleasant break in your day. This will make your life better with the exercises acting as a break in routine you can look forward to.  Lying on your back, slowly slide your foot toward your buttocks, raising your knee up off the floor. Then slowly slide your foot back down until your leg is straight again.  Lying on your back  spread your legs as far apart as you can without causing discomfort.  Lying on your side, raise your upper leg and foot straight up from the floor as far as is comfortable. Slowly lower the leg and repeat.  Lying on your back, tighten up the muscle in the front of your thigh (quadriceps muscles). You can do this by keeping your leg straight and trying to raise your heel off the floor. This helps strengthen the largest muscle supporting your knee.  Lying on your back, tighten up the muscles of your buttocks both with the legs straight and with the knee bent at a comfortable angle while keeping your heel on the floor.   DON'T FORGET THE 4 POSTERIOR HIP PRECAUTIONS     MAKE SURE YOU:  Understand these instructions.  Get help right away if you are not doing well or get worse.    Pick up stool softner and laxative for home use following surgery while on pain medications. Continue to use ice for pain and swelling after surgery.

## 2015-12-02 NOTE — Progress Notes (Signed)
Pt resting between care. Pain controlled with PRN medication. No acute distress noted.

## 2015-12-02 NOTE — Discharge Summary (Signed)
Physician Discharge Summary  Patient ID: Claudia Terry MRN: [id number] DOB/AGE: 1945-08-15 70 y.o.  Admit date: 12/01/2015 Discharge date: 12/02/2015  Admission Diagnoses:  Surgery, elective [Z41.9] Hip dislocation, left, initial encounter ([place]) [S73.005A] Closed reduction on left total hip arthroplasty  Discharge Diagnoses: Patient Active Problem List   Diagnosis Date Noted  . Failed total hip arthroplasty with dislocation ([place]) 12/01/2015  . S/P closed reduction of dislocated total hip prosthesis 08/08/2015  . Primary osteoarthritis of right hip 06/14/2015  . S/P total hip arthroplasty 06/14/2015  . Benign essential HTN 12/28/2013  . Acid reflux 12/28/2013  . HLD (hyperlipidemia) 12/28/2013  . Restless leg 12/28/2013    Past Medical History  Diagnosis Date  . Menopause   . Low back pain 11/95  . Restless leg syndrome 1999  . GERD (gastroesophageal reflux disease) 8/01     Dr. Tiffany Kocher  . Arthritis      Transfusion: none   Consultants (if any):  none  Discharged Condition: Improved  Hospital Course: Claudia Terry is an 70 y.o. female who was admitted 12/01/2015 with a diagnosis of dislocation of left total hip arthroplasty and went to the operating room on 12/01/2015 and underwent the above named procedures.    Surgeries:Procedure(s): CLOSED REDUCTION HIP on 12/01/2015   PRE-OPERATIVE DIAGNOSIS: Recurrent dislocation of the left total hip arthroplasty  POST-OPERATIVE DIAGNOSIS: Same  PROCEDURE: Closed reduction of the dislocated left total hip arthroplasty  SURGEON: Marciano Sequin., M.D.  ASSISTANT: None  ANESTHESIA: general  ESTIMATED BLOOD LOSS: None  FLUIDS REPLACED: 600 mL of crystalloid  TOURNIQUET TIME: Not applicable   DRAINS: None  IMPLANTS UTILIZED: Not applicable  INDICATIONS FOR SURGERY: Claudia Terry is a 70 y.o. year old female who has been seen for complaints of left hip pain. She had sustained a recurrent posterior  dislocation of the left total hip arthroplasty while attempting to change her shoes. After discussion of the risks and benefits of surgical intervention, the patient expressed understanding of the risks benefits and agree with plans for closed reduction of the dislocated left total hip arthroplasty under anesthesia.   PROCEDURE IN DETAIL: The patient was brought into the operating room and, after adequate general anesthesia was achieved, the patient was transferred to the OR table. A timeout was performed as per usual protocol. The pelvis was stabilized and the hip was flexed, internally rotated, and traction was applied. There was a palpable "clunk". The hip was then visualized using the C-arm and concentric reduction of the femoral head was noted. The hip was placed through a range of motion with multiple images obtained using the C-arm with good maintenance of the reduction noted.  The patient tolerated the procedure well. She was transported to the recovery room in stable condition. Patient tolerated the surgery well. No complications .Patient was taken to PACU where she was stabilized and then transferred to the orthopedic floor.  Heels elevated off bed with rolled towels. No evidence of DVT. Calves non tender. Negative Homan   Patient's IV was d/c'd on day # 1   She was given perioperative antibiotics:  Anti-infectives    None    .  She benefited maximally from the hospital stay and there were no complications.    Recent vital signs:  Filed Vitals:   12/02/15 0505 12/02/15 0802  BP: 107/57 106/63  Pulse: 79 77  Temp: 98.4 F (36.9 C) 98.4 F (36.9 C)  Resp: 18 18    Recent laboratory studies:  Lab Results  Component Value Date   HGB 13.6 12/01/2015   HGB 13.1 08/08/2015   HGB 12.1 06/16/2015   Lab Results  Component Value Date   WBC 9.1 12/01/2015   PLT 198 12/01/2015   Lab Results  Component Value Date   INR 1.08 12/01/2015   Lab Results  Component Value Date    NA 137 12/01/2015   K 3.1* 12/01/2015   CL 105 12/01/2015   CO2 22 12/01/2015   BUN 17 12/01/2015   CREATININE 0.80 12/01/2015   GLUCOSE 134* 12/01/2015    Discharge Medications:     Medication List    TAKE these medications        acetaminophen 500 MG tablet  Commonly known as:  TYLENOL  Take 1,000 mg by mouth every 6 (six) hours as needed for mild pain or headache.     atenolol 50 MG tablet  Commonly known as:  TENORMIN  Take 50 mg by mouth daily.     Cinnamon 500 MG capsule  Take 500 mg by mouth daily.     HYDROcodone-acetaminophen 5-325 MG tablet  Commonly known as:  NORCO/VICODIN  Take 1-2 tablets by mouth every 4 (four) hours as needed for moderate pain.     lansoprazole 30 MG capsule  Commonly known as:  PREVACID  Take 30 mg by mouth 2 (two) times daily before a meal.     Melatonin 10 MG Tabs  Take 10 mg by mouth at bedtime.     MIRAPEX 0.5 MG tablet  Generic drug:  pramipexole  Take 0.5 mg by mouth at bedtime.     multivitamin with minerals Tabs tablet  Take 1 tablet by mouth daily.     Red Yeast Rice 600 MG Tabs  Take 1,200 mg by mouth daily.     Turmeric 500 MG Caps  Take 500 mg by mouth daily.     Vitamin D3 5000 units Caps  Take 5,000 Units by mouth daily.        Diagnostic Studies: Dg C-arm 1-60 Min-no Report  12/01/2015  CLINICAL DATA: surgery C-ARM 1-60 MINUTES Fluoroscopy was utilized by the requesting physician.  No radiographic interpretation.   Dg Hip Unilat With Pelvis 2-3 Views Left  12/01/2015  CLINICAL DATA:  Left hip dislocation EXAM: DG HIP (WITH OR WITHOUT PELVIS) 2-3V LEFT COMPARISON:  09/18/2015 hip radiograph FINDINGS: There is superolateral dislocation of the left femoral head prosthesis at the left hip joint. Bilateral total hip arthroplasty. No evidence of hardware fracture or loosening. No osseous fracture or suspicious focal osseous lesion. Deformities in the right pubic rami appear healed. IMPRESSION: Superolateral  dislocation of the left femoral head prosthesis at the left hip joint. No hardware or osseous fracture. Electronically Signed   By: Ilona Sorrel M.D.   On: 12/01/2015 15:42    Disposition: 01-Home or Self Care      Discharge Instructions    Diet - low sodium heart healthy    Complete by:  As directed      Increase activity slowly    Complete by:  As directed               Signed: WOLFE,JON R. 12/02/2015, 8:34 AM

## 2015-12-02 NOTE — Progress Notes (Signed)
Discharge instructions reviewed with the pt.  Pt sent out via wheelchair wearing her brace.  Husband is driving her home

## 2015-12-04 ENCOUNTER — Encounter: Payer: Self-pay | Admitting: Orthopedic Surgery

## 2016-02-05 DIAGNOSIS — M722 Plantar fascial fibromatosis: Secondary | ICD-10-CM | POA: Diagnosis not present

## 2016-02-06 DIAGNOSIS — Z79899 Other long term (current) drug therapy: Secondary | ICD-10-CM | POA: Diagnosis not present

## 2016-02-06 DIAGNOSIS — E7800 Pure hypercholesterolemia, unspecified: Secondary | ICD-10-CM | POA: Diagnosis not present

## 2016-02-07 ENCOUNTER — Encounter
Admission: RE | Admit: 2016-02-07 | Discharge: 2016-02-07 | Disposition: A | Discharge status: Home / Self Care | Payer: PPO | Source: Ambulatory Visit | Attending: Orthopedic Surgery | Admitting: Orthopedic Surgery

## 2016-02-07 DIAGNOSIS — I499 Cardiac arrhythmia, unspecified: Secondary | ICD-10-CM | POA: Diagnosis not present

## 2016-02-07 DIAGNOSIS — Z01812 Encounter for preprocedural laboratory examination: Secondary | ICD-10-CM | POA: Diagnosis not present

## 2016-02-07 DIAGNOSIS — Z01810 Encounter for preprocedural cardiovascular examination: Secondary | ICD-10-CM | POA: Insufficient documentation

## 2016-02-07 HISTORY — DX: Adverse effect of unspecified anesthetic, initial encounter: T41.45XA

## 2016-02-07 HISTORY — DX: Other complications of anesthesia, initial encounter: T88.59XA

## 2016-02-07 LAB — URINALYSIS COMPLETE WITH MICROSCOPIC (ARMC ONLY)
Bacteria, UA: NONE SEEN
Bilirubin Urine: NEGATIVE
Glucose, UA: NEGATIVE mg/dL
Hgb urine dipstick: NEGATIVE
Ketones, ur: NEGATIVE mg/dL
Leukocytes, UA: NEGATIVE
Nitrite: NEGATIVE
Protein, ur: NEGATIVE mg/dL
Specific Gravity, Urine: 1.013 (ref 1.005–1.030)
pH: 7 (ref 5.0–8.0)

## 2016-02-07 LAB — SEDIMENTATION RATE: Sed Rate: 9 mm/hr (ref 0–30)

## 2016-02-07 LAB — SURGICAL PCR SCREEN
MRSA, PCR: NEGATIVE
Staphylococcus aureus: NEGATIVE

## 2016-02-07 LAB — TYPE AND SCREEN
ABO/RH(D): O POS
Antibody Screen: NEGATIVE

## 2016-02-07 LAB — APTT: aPTT: 28 seconds (ref 24–36)

## 2016-02-07 LAB — PROTIME-INR
INR: 0.95
Prothrombin Time: 12.7 seconds (ref 11.4–15.2)

## 2016-02-07 NOTE — Pre-Procedure Instructions (Signed)
Pt had CBC with diff and Met C done on 02/06/16 in PCP office, pt request for PAT use these results and not repeat this test.. Dc'd CBC and Met C for PAT's visit today.

## 2016-02-07 NOTE — Patient Instructions (Signed)
  Your procedure is scheduled on:Wednesday Sept. 13 , 2017. Report to Same Day Surgery. To find out your arrival time please call [phone] between 1PM - 3PM on Tuesday Sept. 12, 2017.  Remember: Instructions that are not followed completely may result in serious medical risk, up to and including death, or upon the discretion of your surgeon and anesthesiologist your surgery may need to be rescheduled.    _x___ 1. Do not eat food or drink liquids after midnight. No gum chewing or  hard candies.     ____ 2. No Alcohol for 24 hours before or after surgery.   ____ 3. Bring all medications with you on the day of surgery if instructed.    __x__ 4. Notify your doctor if there is any change in your medical condition     (cold, fever, infections).    _____ 5. No smoking 24 hours prior to surgery.     Do not wear jewelry, make-up, hairpins, clips or nail polish.  Do not wear lotions, powders, or perfumes.   Do not shave 48 hours prior to surgery. Men may shave face and neck.  Do not bring valuables to the hospital.    [facility] is not responsible for any belongings or valuables.               Contacts, dentures or bridgework may not be worn into surgery.  Leave your suitcase in the car. After surgery it may be brought to your room.  For patients admitted to the hospital, discharge time is determined by your treatment team.   Patients discharged the day of surgery will not be allowed to drive home.    Please read over the following fact sheets that you were given:   [facility] Preparing for Surgery  __x__ Take these medicines the morning of surgery with A SIP OF WATER:    1.atenolol (TENORMIN)   2. omeprazole (PRILOSEC)    ____ Fleet Enema (as directed)   _x___ Use CHG Soap as directed on instruction sheet  ____ Use inhalers on the day of surgery and bring to hospital day of surgery  ____ Stop metformin 2 days prior to surgery    ____ Take 1/2 of usual insulin dose the  night before surgery and none on the morning of surgery.   ____ Stop Coumadin/Plavix/aspirin on does not apply.  _x___ Stop Anti-inflammatories such as Advil, Aleve, Ibuprofen, Motrin, Naproxen, Naprosyn, Goodies powders or aspirin products. OK to take Tylenol or Ultram.   _x___ Stop supplements:Cinnamon,Melatonin, Red Yeast Rice, Turmeric until after surgery.    ____ Bring C-Pap to the hospital.

## 2016-02-09 LAB — URINE CULTURE
Culture: <10000 — AB
Special Requests: NORMAL

## 2016-02-13 ENCOUNTER — Ambulatory Visit: Payer: Medicare Other | Admitting: Nurse Practitioner

## 2016-02-13 DIAGNOSIS — Z0000 Encounter for general adult medical examination without abnormal findings: Secondary | ICD-10-CM | POA: Diagnosis not present

## 2016-02-13 DIAGNOSIS — Z79899 Other long term (current) drug therapy: Secondary | ICD-10-CM | POA: Diagnosis not present

## 2016-02-13 DIAGNOSIS — R739 Hyperglycemia, unspecified: Secondary | ICD-10-CM | POA: Diagnosis not present

## 2016-02-13 DIAGNOSIS — E7800 Pure hypercholesterolemia, unspecified: Secondary | ICD-10-CM | POA: Diagnosis not present

## 2016-02-21 ENCOUNTER — Encounter
Admission: RE | Disposition: A | Discharge status: Home Health | Payer: Self-pay | Source: Ambulatory Visit | Attending: Orthopedic Surgery

## 2016-02-21 ENCOUNTER — Inpatient Hospital Stay
Admission: RE | Admit: 2016-02-21 | Discharge: 2016-02-23 | DRG: 468 | Disposition: A | Discharge status: Home Health | Payer: PPO | Source: Ambulatory Visit | Attending: Orthopedic Surgery | Admitting: Orthopedic Surgery

## 2016-02-21 ENCOUNTER — Inpatient Hospital Stay: Payer: PPO | Admitting: Anesthesiology

## 2016-02-21 ENCOUNTER — Encounter: Payer: Self-pay | Admitting: *Deleted

## 2016-02-21 ENCOUNTER — Inpatient Hospital Stay: Payer: PPO

## 2016-02-21 DIAGNOSIS — K219 Gastro-esophageal reflux disease without esophagitis: Secondary | ICD-10-CM | POA: Diagnosis present

## 2016-02-21 DIAGNOSIS — Z96643 Presence of artificial hip joint, bilateral: Secondary | ICD-10-CM | POA: Diagnosis not present

## 2016-02-21 DIAGNOSIS — T84021A Dislocation of internal left hip prosthesis, initial encounter: Secondary | ICD-10-CM | POA: Diagnosis not present

## 2016-02-21 DIAGNOSIS — Y792 Prosthetic and other implants, materials and accessory orthopedic devices associated with adverse incidents: Secondary | ICD-10-CM | POA: Diagnosis not present

## 2016-02-21 DIAGNOSIS — Z471 Aftercare following joint replacement surgery: Secondary | ICD-10-CM | POA: Diagnosis not present

## 2016-02-21 DIAGNOSIS — T84091A Other mechanical complication of internal left hip prosthesis, initial encounter: Secondary | ICD-10-CM | POA: Diagnosis not present

## 2016-02-21 DIAGNOSIS — Z96642 Presence of left artificial hip joint: Secondary | ICD-10-CM | POA: Diagnosis not present

## 2016-02-21 DIAGNOSIS — Z96649 Presence of unspecified artificial hip joint: Secondary | ICD-10-CM

## 2016-02-21 DIAGNOSIS — G2581 Restless legs syndrome: Secondary | ICD-10-CM | POA: Diagnosis not present

## 2016-02-21 DIAGNOSIS — S73005A Unspecified dislocation of left hip, initial encounter: Secondary | ICD-10-CM | POA: Diagnosis not present

## 2016-02-21 HISTORY — PX: TOTAL HIP REVISION: SHX763

## 2016-02-21 SURGERY — TOTAL HIP REVISION
Anesthesia: General | Site: Hip | Laterality: Left | Wound class: Clean

## 2016-02-21 MED ORDER — ACETAMINOPHEN 325 MG PO TABS
650.0000 mg | ORAL_TABLET | Freq: Four times a day (QID) | ORAL | Status: DC | PRN
Start: 2016-02-21 — End: 2016-02-23

## 2016-02-21 MED ORDER — BISACODYL 10 MG RE SUPP: 10.0000 mg | Freq: Every day | RECTAL | Status: DC | PRN

## 2016-02-21 MED ORDER — PROPOFOL 10 MG/ML IV BOLUS
INTRAVENOUS | Status: DC | PRN
  Administered 2016-02-21: 200 mg via INTRAVENOUS

## 2016-02-21 MED ORDER — FERROUS SULFATE 325 (65 FE) MG PO TABS
325.0000 mg | ORAL_TABLET | Freq: Two times a day (BID) | ORAL | Status: DC
  Administered 2016-02-21 – 2016-02-22 (×3): 325 mg via ORAL
  Filled 2016-02-21 (×4): qty 1

## 2016-02-21 MED ORDER — PANTOPRAZOLE SODIUM 40 MG PO TBEC
40.0000 mg | DELAYED_RELEASE_TABLET | Freq: Two times a day (BID) | ORAL | Status: DC
  Administered 2016-02-21 – 2016-02-23 (×4): 40 mg via ORAL
  Filled 2016-02-21 (×5): qty 1

## 2016-02-21 MED ORDER — KETAMINE HCL 50 MG/ML IJ SOLN
INTRAMUSCULAR | Status: DC | PRN
  Administered 2016-02-21 (×2): 50 mg via INTRAMUSCULAR

## 2016-02-21 MED ORDER — PRAMIPEXOLE DIHYDROCHLORIDE 0.25 MG PO TABS
0.5000 mg | ORAL_TABLET | Freq: Every day | ORAL | Status: DC
  Administered 2016-02-21 – 2016-02-22 (×2): 0.5 mg via ORAL
  Filled 2016-02-21 (×3): qty 2

## 2016-02-21 MED ORDER — LACTATED RINGERS IV SOLN
INTRAVENOUS | Status: DC
  Administered 2016-02-21 (×2): via INTRAVENOUS

## 2016-02-21 MED ORDER — CHLORHEXIDINE GLUCONATE 4 % EX LIQD: 60.0000 mL | Freq: Once | CUTANEOUS | Status: DC

## 2016-02-21 MED ORDER — MORPHINE SULFATE (PF) 2 MG/ML IV SOLN
2.0000 mg | INTRAVENOUS | Status: DC | PRN
  Administered 2016-02-21 – 2016-02-22 (×4): 2 mg via INTRAVENOUS
  Filled 2016-02-21 (×4): qty 1

## 2016-02-21 MED ORDER — PHENOL 1.4 % MT LIQD
1.0000 | OROMUCOSAL | Status: DC | PRN
  Filled 2016-02-21: qty 177

## 2016-02-21 MED ORDER — ONDANSETRON HCL 4 MG/2ML IJ SOLN
INTRAMUSCULAR | Status: DC | PRN
  Administered 2016-02-21: 4 mg via INTRAVENOUS

## 2016-02-21 MED ORDER — SODIUM CHLORIDE 0.9 % IV SOLN
INTRAVENOUS | Status: DC
  Administered 2016-02-21: 14:00:00 via INTRAVENOUS

## 2016-02-21 MED ORDER — HYDROMORPHONE HCL 1 MG/ML IJ SOLN
INTRAMUSCULAR | Status: DC | PRN
  Administered 2016-02-21 (×2): .2 mg via INTRAVENOUS

## 2016-02-21 MED ORDER — NEOMYCIN-POLYMYXIN B GU 40-200000 IR SOLN
Status: DC | PRN
  Administered 2016-02-21: 14 mL

## 2016-02-21 MED ORDER — ACETAMINOPHEN 10 MG/ML IV SOLN
1000.0000 mg | Freq: Four times a day (QID) | INTRAVENOUS | Status: AC
Start: 2016-02-21 — End: 2016-02-22
  Administered 2016-02-21 – 2016-02-22 (×4): 1000 mg via INTRAVENOUS
  Filled 2016-02-21 (×4): qty 100

## 2016-02-21 MED ORDER — OXYCODONE HCL 5 MG PO TABS
5.0000 mg | ORAL_TABLET | ORAL | Status: DC | PRN
  Administered 2016-02-21: 5 mg via ORAL
  Administered 2016-02-21: 10 mg via ORAL
  Administered 2016-02-21 (×2): 5 mg via ORAL
  Administered 2016-02-22 – 2016-02-23 (×6): 10 mg via ORAL
  Filled 2016-02-21: qty 1
  Filled 2016-02-21 (×7): qty 2
  Filled 2016-02-21: qty 1
  Filled 2016-02-21: qty 2

## 2016-02-21 MED ORDER — FLEET ENEMA 7-19 GM/118ML RE ENEM: 1.0000 | ENEMA | Freq: Once | RECTAL | Status: DC | PRN

## 2016-02-21 MED ORDER — METOCLOPRAMIDE HCL 10 MG PO TABS
10.0000 mg | ORAL_TABLET | Freq: Three times a day (TID) | ORAL | Status: AC
  Administered 2016-02-21 – 2016-02-23 (×8): 10 mg via ORAL
  Filled 2016-02-21 (×8): qty 1

## 2016-02-21 MED ORDER — CALCIUM 600-400 MG-UNIT PO CHEW: 2.0000 | CHEWABLE_TABLET | ORAL | Status: DC

## 2016-02-21 MED ORDER — MICONAZOLE NITRATE 2 % EX CREA
1.0000 "application " | TOPICAL_CREAM | Freq: Two times a day (BID) | CUTANEOUS | Status: DC
  Filled 2016-02-21: qty 14

## 2016-02-21 MED ORDER — LIDOCAINE HCL (CARDIAC) 20 MG/ML IV SOLN
INTRAVENOUS | Status: DC | PRN
  Administered 2016-02-21: 30 mg via INTRAVENOUS

## 2016-02-21 MED ORDER — MIDAZOLAM HCL 2 MG/2ML IJ SOLN
INTRAMUSCULAR | Status: DC | PRN
  Administered 2016-02-21: 2 mg via INTRAVENOUS

## 2016-02-21 MED ORDER — TRAMADOL HCL 50 MG PO TABS
50.0000 mg | ORAL_TABLET | ORAL | Status: DC | PRN
  Administered 2016-02-22 (×2): 100 mg via ORAL
  Filled 2016-02-21 (×2): qty 2

## 2016-02-21 MED ORDER — CLINDAMYCIN PHOSPHATE 600 MG/50ML IV SOLN
600.0000 mg | Freq: Four times a day (QID) | INTRAVENOUS | Status: AC
  Administered 2016-02-21 – 2016-02-22 (×4): 600 mg via INTRAVENOUS
  Filled 2016-02-21 (×4): qty 50

## 2016-02-21 MED ORDER — SENNOSIDES-DOCUSATE SODIUM 8.6-50 MG PO TABS
1.0000 | ORAL_TABLET | Freq: Two times a day (BID) | ORAL | Status: DC
  Administered 2016-02-21 – 2016-02-23 (×4): 1 via ORAL
  Filled 2016-02-21 (×4): qty 1

## 2016-02-21 MED ORDER — ENOXAPARIN SODIUM 30 MG/0.3ML ~~LOC~~ SOLN
30.0000 mg | Freq: Two times a day (BID) | SUBCUTANEOUS | Status: DC
  Administered 2016-02-22 – 2016-02-23 (×3): 30 mg via SUBCUTANEOUS
  Filled 2016-02-21 (×3): qty 0.3

## 2016-02-21 MED ORDER — ADULT MULTIVITAMIN W/MINERALS CH
1.0000 | ORAL_TABLET | Freq: Every day | ORAL | Status: DC
Start: 2016-02-21 — End: 2016-02-23
  Administered 2016-02-22 – 2016-02-23 (×2): 1 via ORAL
  Filled 2016-02-21 (×2): qty 1

## 2016-02-21 MED ORDER — TRANEXAMIC ACID 1000 MG/10ML IV SOLN
1000.0000 mg | Freq: Once | INTRAVENOUS | Status: AC
  Administered 2016-02-21: 1000 mg via INTRAVENOUS
  Filled 2016-02-21: qty 10

## 2016-02-21 MED ORDER — ACETAMINOPHEN 10 MG/ML IV SOLN
INTRAVENOUS | Status: DC | PRN
  Administered 2016-02-21: 1000 mg via INTRAVENOUS

## 2016-02-21 MED ORDER — VITAMIN D 1000 UNITS PO TABS
5000.0000 [IU] | ORAL_TABLET | Freq: Every day | ORAL | Status: DC
  Administered 2016-02-22 – 2016-02-23 (×2): 5000 [IU] via ORAL
  Filled 2016-02-21 (×4): qty 5

## 2016-02-21 MED ORDER — ONDANSETRON HCL 4 MG/2ML IJ SOLN
4.0000 mg | Freq: Four times a day (QID) | INTRAMUSCULAR | Status: DC | PRN
  Administered 2016-02-21: 4 mg via INTRAVENOUS
  Filled 2016-02-21: qty 2

## 2016-02-21 MED ORDER — TRANEXAMIC ACID 1000 MG/10ML IV SOLN
1000.0000 mg | INTRAVENOUS | Status: AC
  Administered 2016-02-21: 1000 mg via INTRAVENOUS
  Filled 2016-02-21: qty 10

## 2016-02-21 MED ORDER — ACETAMINOPHEN 650 MG RE SUPP: 650.0000 mg | Freq: Four times a day (QID) | RECTAL | Status: DC | PRN

## 2016-02-21 MED ORDER — ATENOLOL 50 MG PO TABS
50.0000 mg | ORAL_TABLET | Freq: Every day | ORAL | Status: DC
  Administered 2016-02-22 – 2016-02-23 (×2): 50 mg via ORAL
  Filled 2016-02-21 (×2): qty 1

## 2016-02-21 MED ORDER — FENTANYL CITRATE (PF) 100 MCG/2ML IJ SOLN
INTRAMUSCULAR | Status: AC
  Administered 2016-02-21: 25 ug via INTRAVENOUS
  Filled 2016-02-21: qty 2

## 2016-02-21 MED ORDER — MENTHOL 3 MG MT LOZG
1.0000 | LOZENGE | OROMUCOSAL | Status: DC | PRN
  Filled 2016-02-21: qty 9

## 2016-02-21 MED ORDER — EPHEDRINE SULFATE 50 MG/ML IJ SOLN
INTRAMUSCULAR | Status: DC | PRN
  Administered 2016-02-21: 10 mg via INTRAVENOUS
  Administered 2016-02-21 (×2): 5 mg via INTRAVENOUS

## 2016-02-21 MED ORDER — DIPHENHYDRAMINE HCL 12.5 MG/5ML PO ELIX: 12.5000 mg | ORAL_SOLUTION | ORAL | Status: DC | PRN

## 2016-02-21 MED ORDER — MAGNESIUM HYDROXIDE 400 MG/5ML PO SUSP
30.0000 mL | Freq: Every day | ORAL | Status: DC | PRN
  Administered 2016-02-22: 30 mL via ORAL
  Filled 2016-02-21: qty 30

## 2016-02-21 MED ORDER — SUCCINYLCHOLINE CHLORIDE 20 MG/ML IJ SOLN
INTRAMUSCULAR | Status: DC | PRN
  Administered 2016-02-21: 100 mg via INTRAVENOUS

## 2016-02-21 MED ORDER — ACETAMINOPHEN 10 MG/ML IV SOLN
INTRAVENOUS | Status: AC
  Filled 2016-02-21: qty 100

## 2016-02-21 MED ORDER — NEOMYCIN-POLYMYXIN B GU 40-200000 IR SOLN
Status: AC
  Filled 2016-02-21: qty 20

## 2016-02-21 MED ORDER — CLINDAMYCIN PHOSPHATE 900 MG/50ML IV SOLN
INTRAVENOUS | Status: AC
  Filled 2016-02-21: qty 50

## 2016-02-21 MED ORDER — CLINDAMYCIN PHOSPHATE 900 MG/50ML IV SOLN
900.0000 mg | INTRAVENOUS | Status: AC
  Administered 2016-02-21: 900 mg via INTRAVENOUS

## 2016-02-21 MED ORDER — ONDANSETRON HCL 4 MG/2ML IJ SOLN: 4.0000 mg | Freq: Once | INTRAMUSCULAR | Status: DC | PRN

## 2016-02-21 MED ORDER — ONDANSETRON HCL 4 MG PO TABS: 4.0000 mg | ORAL_TABLET | Freq: Four times a day (QID) | ORAL | Status: DC | PRN

## 2016-02-21 MED ORDER — CALCIUM CARBONATE-VITAMIN D 500-200 MG-UNIT PO TABS
2.0000 | ORAL_TABLET | Freq: Every day | ORAL | Status: DC
  Administered 2016-02-22 – 2016-02-23 (×2): 2 via ORAL
  Filled 2016-02-21 (×2): qty 2

## 2016-02-21 MED ORDER — DEXAMETHASONE SODIUM PHOSPHATE 10 MG/ML IJ SOLN
INTRAMUSCULAR | Status: DC | PRN
  Administered 2016-02-21: 10 mg via INTRAVENOUS

## 2016-02-21 MED ORDER — ALUM & MAG HYDROXIDE-SIMETH 200-200-20 MG/5ML PO SUSP: 30.0000 mL | ORAL | Status: DC | PRN

## 2016-02-21 MED ORDER — FENTANYL CITRATE (PF) 100 MCG/2ML IJ SOLN
INTRAMUSCULAR | Status: DC | PRN
  Administered 2016-02-21: 100 ug via INTRAVENOUS

## 2016-02-21 MED ORDER — FENTANYL CITRATE (PF) 100 MCG/2ML IJ SOLN
25.0000 ug | INTRAMUSCULAR | Status: DC | PRN
  Administered 2016-02-21 (×2): 25 ug via INTRAVENOUS

## 2016-02-21 SURGICAL SUPPLY — 54 items
BUR SURG ROUTER 3X19 TPS (BURR) ×2 IMPLANT
CANISTER SUCT 1200ML W/VALVE (MISCELLANEOUS) ×2 IMPLANT
CANISTER SUCT 3000ML (MISCELLANEOUS) ×4 IMPLANT
CATH TRAY METER 16FR LF (MISCELLANEOUS) ×2 IMPLANT
DRAPE INCISE IOBAN 66X60 STRL (DRAPES) ×2 IMPLANT
DRAPE SHEET LG 3/4 BI-LAMINATE (DRAPES) ×4 IMPLANT
DRAPE TABLE BACK 80X90 (DRAPES) ×2 IMPLANT
DRSG DERMACEA 8X12 NADH (GAUZE/BANDAGES/DRESSINGS) ×2 IMPLANT
DRSG OPSITE POSTOP 4X12 (GAUZE/BANDAGES/DRESSINGS) ×1 IMPLANT
DRSG OPSITE POSTOP 4X14 (GAUZE/BANDAGES/DRESSINGS) ×1 IMPLANT
DURAPREP 26ML APPLICATOR (WOUND CARE) ×2 IMPLANT
ELECT BLADE 6.5 EXT (BLADE) ×2 IMPLANT
ELECT CAUTERY BLADE 6.4 (BLADE) ×2 IMPLANT
GAUZE SPONGE 4X4 12PLY STRL (GAUZE/BANDAGES/DRESSINGS) ×2 IMPLANT
GLOVE BIOGEL M STRL SZ7.5 (GLOVE) ×4 IMPLANT
GLOVE INDICATOR 8.0 STRL GRN (GLOVE) ×2 IMPLANT
GLOVE SURG 9.0 ORTHO LTXF (GLOVE) ×2 IMPLANT
GLOVE SURG ORTHO 9.0 STRL STRW (GLOVE) ×2 IMPLANT
GOWN STRL REUS W/ TWL LRG LVL3 (GOWN DISPOSABLE) ×2 IMPLANT
GOWN STRL REUS W/TWL 2XL LVL3 (GOWN DISPOSABLE) ×2 IMPLANT
GOWN STRL REUS W/TWL LRG LVL3 (GOWN DISPOSABLE) ×4
HANDPIECE INTERPULSE COAX TIP (DISPOSABLE) ×2
HANDPIECE VERSAJET DEBRIDEMENT (MISCELLANEOUS) ×2 IMPLANT
HEAD FEM STD 32X+9 STRL (Hips) ×1 IMPLANT
HEMOVAC 400CC 10FR (MISCELLANEOUS) ×2 IMPLANT
HOOD PEEL AWAY FLYTE STAYCOOL (MISCELLANEOUS) ×4 IMPLANT
IRRIGATION STRYKERFLOW (MISCELLANEOUS) ×1 IMPLANT
IRRIGATOR STRYKERFLOW (MISCELLANEOUS) ×2
IV NS 100ML SINGLE PACK (IV SOLUTION) ×2 IMPLANT
LINER PINNACLE (Hips) ×1 IMPLANT
NDL FILTER BLUNT 18X1 1/2 (NEEDLE) ×1 IMPLANT
NDL SAFETY 18GX1.5 (NEEDLE) ×2 IMPLANT
NEEDLE FILTER BLUNT 18X 1/2SAF (NEEDLE) ×1
NEEDLE FILTER BLUNT 18X1 1/2 (NEEDLE) ×1 IMPLANT
NS IRRIG 1000ML POUR BTL (IV SOLUTION) ×2 IMPLANT
PACK HIP PROSTHESIS (MISCELLANEOUS) ×2 IMPLANT
SET HNDPC FAN SPRY TIP SCT (DISPOSABLE) ×1 IMPLANT
SOL .9 NS 3000ML IRR  AL (IV SOLUTION) ×1
SOL .9 NS 3000ML IRR AL (IV SOLUTION) ×1
SOL .9 NS 3000ML IRR UROMATIC (IV SOLUTION) ×1 IMPLANT
STAPLER SKIN PROX 35W (STAPLE) ×2 IMPLANT
STRAP SAFETY BODY (MISCELLANEOUS) ×2 IMPLANT
SUCTION FRAZIER HANDLE 10FR (MISCELLANEOUS) ×1
SUCTION TUBE FRAZIER 10FR DISP (MISCELLANEOUS) ×1 IMPLANT
SUT ETHIBOND #5 BRAIDED 30INL (SUTURE) ×2 IMPLANT
SUT VIC AB 0 CT1 36 (SUTURE) ×2 IMPLANT
SUT VIC AB 1 CT1 36 (SUTURE) ×4 IMPLANT
SUT VIC AB 2-0 CT1 27 (SUTURE) ×2
SUT VIC AB 2-0 CT1 TAPERPNT 27 (SUTURE) ×1 IMPLANT
SYR 20CC LL (SYRINGE) ×2 IMPLANT
TAPE ADH 3 LX (MISCELLANEOUS) ×2 IMPLANT
TAPE TRANSPORE STRL 2 31045 (GAUZE/BANDAGES/DRESSINGS) ×2 IMPLANT
TIP COAXIAL FEMORAL CANAL (MISCELLANEOUS) ×2 IMPLANT
TOWEL OR 17X26 4PK STRL BLUE (TOWEL DISPOSABLE) ×2 IMPLANT

## 2016-02-21 NOTE — Progress Notes (Signed)
Patient requested scheduled medication early. Education given to patient on administration times, pt states " I  cannot wait that long and I take my medications at home early". Dicussed with supervisor Colletta [place]. Medication given see MAR.

## 2016-02-21 NOTE — Anesthesia Procedure Notes (Signed)
Procedure Name: Intubation Date/Time: 02/21/2016 7:31 AM Performed by: Kennon Holter Pre-anesthesia Checklist: Patient identified, Patient being monitored, Timeout performed, Emergency Drugs available and Suction available Patient Re-evaluated:Patient Re-evaluated prior to inductionOxygen Delivery Method: Circle system utilized Preoxygenation: Pre-oxygenation with 100% oxygen Intubation Type: IV induction Ventilation: Mask ventilation without difficulty Laryngoscope Size: Mac, 3 and Robertshaw Grade View: Grade I Tube type: Oral Tube size: 7.0 mm Number of attempts: 1 Airway Equipment and Method: Stylet Placement Confirmation: ETT inserted through vocal cords under direct vision,  positive ETCO2 and breath sounds checked- equal and bilateral Secured at: 21 cm Tube secured with: Tape Dental Injury: Teeth and Oropharynx as per pre-operative assessment

## 2016-02-21 NOTE — Anesthesia Preprocedure Evaluation (Signed)
Anesthesia Evaluation  Patient identified by MRN, date of birth, ID band Patient awake    Reviewed: Allergy & Precautions, NPO status , Patient's Chart, lab work & pertinent test results  History of Anesthesia Complications (+) history of anesthetic complications (spinals dont work)  Airway Mallampati: III       Dental   Pulmonary neg pulmonary ROS,           Cardiovascular hypertension, Pt. on home beta blockers and Pt. on medications      Neuro/Psych negative neurological ROS     GI/Hepatic Neg liver ROS, GERD  Medicated and Controlled,  Endo/Other  negative endocrine ROS  Renal/GU negative Renal ROS     Musculoskeletal  (+) Arthritis , Osteoarthritis,    Abdominal   Peds  Hematology negative hematology ROS (+)   Anesthesia Other Findings   Reproductive/Obstetrics                             Anesthesia Physical Anesthesia Plan  ASA: II  Anesthesia Plan: General   Post-op Pain Management:    Induction: Intravenous  Airway Management Planned: LMA  Additional Equipment:   Intra-op Plan:   Post-operative Plan:   Informed Consent: I have reviewed the patients History and Physical, chart, labs and discussed the procedure including the risks, benefits and alternatives for the proposed anesthesia with the patient or authorized representative who has indicated his/her understanding and acceptance.     Plan Discussed with:   Anesthesia Plan Comments:         Anesthesia Quick Evaluation

## 2016-02-21 NOTE — Anesthesia Postprocedure Evaluation (Signed)
Anesthesia Post Note  Patient: Claudia Terry  Procedure(s) Performed: Procedure(s) (LRB): TOTAL HIP REVISION (Left)  Patient location during evaluation: PACU Anesthesia Type: General Level of consciousness: awake and alert Pain management: pain level controlled Vital Signs Assessment: post-procedure vital signs reviewed and stable Respiratory status: spontaneous breathing and respiratory function stable Cardiovascular status: stable Anesthetic complications: no    Last Vitals:  Vitals:   02/21/16 1007 02/21/16 1022  BP: 119/71 125/74  Pulse: 71 65  Resp: (!) 23 14  Temp: 36.6 C     Last Pain:  Vitals:   02/21/16 1022  TempSrc:   PainSc: Asleep                 KEPHART,WILLIAM K

## 2016-02-21 NOTE — Progress Notes (Signed)
Upon arrival to room patient sleeping on left side. Pt instructed that it is best we lay flat and use pillow between legs. Patient states" Im laying on my side because it feels better". Educated patient on risks and patient remains on side. Pillow placed between legs and upon exiting room patient intrusted family member to remove. Patient refuses to wear foot pumps and remains on side without pillow. Charge nurse Olivia Mackie notified. Pain controlled with PRN medication.

## 2016-02-21 NOTE — Op Note (Signed)
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 M.D.  ASSISTANT:  Vance Peper, PA (present and scrubbed throughout the case, critical for assistance with exposure, retraction, instrumentation, and closure)  ANESTHESIA: general  ESTIMATED BLOOD LOSS: 50 mL  FLUIDS REPLACED: 1800 mL of crystalloid  DRAINS: None  IMPLANTS UTILIZED: DePuy +4 mm neutral Pinnacle constrained polyethylene insert and locking ring, and a 32 mm CoCr +9 mm hip ball  INDICATIONS FOR SURGERY: Claudia Terry is a 70 y.o. year old female who had a remote left total hip arthroplasty and done well from number of years. However, she has now head 3 episodes of posterior dislocation of the left total hip despite bracing and physical therapy. Radiographs did not demonstrate any evidence of loosening or malposition. After discussion of the risks and benefits of surgical intervention, the patient expressed understanding of the risks benefits and agree with plans for total hip revision arthroplasty.   The risks, benefits, and alternatives were discussed at length including but not limited to the risks of infection, bleeding, nerve injury, stiffness, blood clots, the need for revision surgery, limb length inequality, dislocation, cardiopulmonary complications, among others, and they were willing to proceed.  PROCEDURE IN DETAIL: The patient was brought into the operating room and, after adequate general anesthesia was achieved, the patient was placed in a right lateral decubitus position. Axillary roll was placed and all bony prominences were well-padded. The patient's left hip was cleaned and prepped with alcohol and DuraPrep  and draped in the usual sterile fashion. A "timeout" was performed as per usual protocol. A lateral curvilinear incision was made gently curving towards the posterior superior iliac spine in line with the previous surgical scar. The IT band was incised in line with the skin incision and the fibers of the gluteus maximus were split in line. The IT band was noted to be somewhat attenuated. The remnant of the piriformis tendon was not apparent. There was no evidence of a pseudocapsule posteriorly. Stability was assessed prior to dislocation of the femoral head. The hip was flexed to approximately 70, adducted, and internally rotated to 90 without gross instability appreciated. The femoral head was then dislocated posteriorly. Inspection of the femoral head showed no evidence of corrosion or damage to the articular surface. The femoral head was then disimpacted from the trunnion. The trunnion was also noted be in excellent condition. Next, abundant fibrotic tissue was excised from the anterior aspect of the cup so as to visualize the full periphery of the acetabular component. A 6.5 screw was inserted through a drill hole so as to disimpact the polyethylene. Inspection of polyethylene demonstrated good articular surface with only slight forming along the posterior and superior rim. The acetabular cup was assessed and noted to be stable. Cup was irrigated with copious amounts of saline with MRI solution. Trial reduction was performed using a +4 mm polyethylene trial and a 32 mm cobalt chrome hip ball with a +9 mm neck segment. Good equalization of limb lengths was appreciated and excellent stability noted both anteriorly and posteriorly. Trial components were removed. A +4 mm neutral Pinnacle constrained polyethylene liner was positioned and impacted into place. The trunnion was cleaned and dried. The locking  ring was placed over the neck of the femoral component and the 32 mm cobalt chrome hip ball with a +9 mm neck  length was placed on the trunnion and impacted into place. Hip was reduced and an audible pop was noted consistent with full engagement of the femoral head into the constrained liner. The locking ring was positioned and snapped into place. The hip was again placed range of motion with excellent stability appreciated and no gross evidence of impingement of the neck along the liner.  The wound was irrigated with copious amounts of normal saline with antibiotic solution and suctioned dry. Good hemostasis was appreciated. The IT band was reapproximated using interrupted sutures of #1 Vicryl. Subcutaneous tissue was approximated using first #0 Vicryl followed by #2-0 Vicryl. The skin was closed with skin staples.  The patient tolerated the procedure well and was transported to the recovery room in stable condition.   Marciano Sequin., M.D.

## 2016-02-21 NOTE — Brief Op Note (Signed)
02/21/2016  10:12 AM  PATIENT:  Dorris Fetch Gunawan  70 y.o. 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  POST-OPERATIVE DIAGNOSIS:  Same  PROCEDURE:  Procedure(s): TOTAL HIP REVISION (Left) (conversion to a constrained polyethylene liner)  SURGEON:  Surgeon(s) and Role:    * Dereck Leep, MD - Primary  ASSISTANTS: Vance Peper, PA   ANESTHESIA:   general  EBL:  Total I/O In: 1700 [I.V.:1700] Out: 300 [Urine:250; Blood:50]  BLOOD ADMINISTERED:none  DRAINS: none   LOCAL MEDICATIONS USED:  NONE  SPECIMEN:  No Specimen  DISPOSITION OF SPECIMEN:  N/A  COUNTS:  YES  TOURNIQUET:  * No tourniquets in log *  DICTATION: .Dragon Dictation  PLAN OF CARE: Admit to inpatient   PATIENT DISPOSITION:  PACU - hemodynamically stable.   Delay start of Pharmacological VTE agent (>24hrs) due to surgical blood loss or risk of bleeding: yes

## 2016-02-21 NOTE — H&P (Signed)
The patient has been re-examined, and the chart reviewed, and there have been no interval changes to the documented history and physical.    The risks, benefits, and alternatives have been discussed at length. The patient expressed understanding of the risks benefits and agreed with plans for surgical intervention.  Fortunata Betty P. Korbyn Chopin, Jr. M.D.    

## 2016-02-21 NOTE — NC FL2 (Signed)
[place] LEVEL OF CARE SCREENING TOOL     IDENTIFICATION  Patient Name: Claudia Terry Birthdate: Oct 30, 1945 Sex: female Admission Date (Current Location): 02/21/2016  [place] and [place] Number:  Engineering geologist and Address:  [facility], [address] [place], [place] [address]      Provider Number: [id number]  Attending Physician Name and Address:  Dereck Leep, MD  Relative Name and Phone Number:       Current Level of Care: Hospital Recommended Level of Care: [place] Prior Approval Number:    Date Approved/Denied:   PASRR Number:  ( [id number] )  Discharge Plan: SNF    Current Diagnoses: Patient Active Problem List   Diagnosis Date Noted  . Failed total hip arthroplasty with dislocation ([place]) 12/01/2015  . S/P closed reduction of dislocated total hip prosthesis 08/08/2015  . S/P total hip arthroplasty 06/14/2015  . Benign essential HTN 12/28/2013  . Acid reflux 12/28/2013  . HLD (hyperlipidemia) 12/28/2013  . Restless leg 12/28/2013    Orientation RESPIRATION BLADDER Height & Weight     Self, Time, Situation, Place  O2 (2 Liters Oxygen ) Continent Weight: 180 lb (81.6 kg) Height:  5\' 6"  (167.6 cm)  BEHAVIORAL SYMPTOMS/MOOD NEUROLOGICAL BOWEL NUTRITION STATUS   (none)  (none) Continent Diet (Regular Diet )  AMBULATORY STATUS COMMUNICATION OF NEEDS Skin   Extensive Assist Verbally Surgical wounds (Incision: Left Hip. )                       Personal Care Assistance Level of Assistance  Bathing, Feeding, Dressing Bathing Assistance: Limited assistance Feeding assistance: Independent Dressing Assistance: Limited assistance     Functional Limitations Info  Sight, Hearing, Speech Sight Info: Adequate Hearing Info: Adequate Speech Info: Adequate    SPECIAL CARE FACTORS FREQUENCY  PT (By licensed PT), OT (By licensed OT)     PT Frequency:  (5) OT Frequency:  (5)             Contractures      Additional Factors Info  Code Status, Allergies Code Status Info:  (Full Code. ) Allergies Info:  (Penicillins, Erythromycin, Tetracyclines & Related)           Current Medications (02/21/2016):  This is the current hospital active medication list Current Facility-Administered Medications  Medication Dose Route Frequency Provider Last Rate Last Dose  . 0.9 %  sodium chloride infusion   Intravenous Continuous Dereck Leep, MD      . acetaminophen (OFIRMEV) IV 1,000 mg  1,000 mg Intravenous Q6H Dereck Leep, MD      . acetaminophen (TYLENOL) tablet 650 mg  650 mg Oral Q6H PRN Dereck Leep, MD       Or  . acetaminophen (TYLENOL) suppository 650 mg  650 mg Rectal Q6H PRN Dereck Leep, MD      . alum & mag hydroxide-simeth (MAALOX/MYLANTA) 200-200-20 MG/5ML suspension 30 mL  30 mL Oral Q4H PRN Dereck Leep, MD      . Derrill Memo ON 02/22/2016] atenolol (TENORMIN) tablet 50 mg  50 mg Oral Daily Dereck Leep, MD      . bisacodyl (DULCOLAX) suppository 10 mg  10 mg Rectal Daily PRN Dereck Leep, MD      . Derrill Memo ON 02/22/2016] calcium-vitamin D (OSCAL WITH D) 500-200 MG-UNIT per tablet 2 tablet  2 tablet Oral Q breakfast Sheema M Hallaji, RPH      . [  START ON 02/22/2016] cholecalciferol (VITAMIN D) tablet 5,000 Units  5,000 Units Oral Daily Dereck Leep, MD      . clindamycin (CLEOCIN) IVPB 600 mg  600 mg Intravenous Q6H Dereck Leep, MD      . diphenhydrAMINE (BENADRYL) 12.5 MG/5ML elixir 12.5-25 mg  12.5-25 mg Oral Q4H PRN Dereck Leep, MD      . Derrill Memo ON 02/22/2016] enoxaparin (LOVENOX) injection 30 mg  30 mg Subcutaneous Q12H Dereck Leep, MD      . ferrous sulfate tablet 325 mg  325 mg Oral BID WC Dereck Leep, MD      . magnesium hydroxide (MILK OF MAGNESIA) suspension 30 mL  30 mL Oral Daily PRN Dereck Leep, MD      . menthol-cetylpyridinium (CEPACOL) lozenge 3 mg  1 lozenge Oral PRN Dereck Leep, MD       Or  . phenol (CHLORASEPTIC) mouth  spray 1 spray  1 spray Mouth/Throat PRN Dereck Leep, MD      . metoCLOPramide (REGLAN) tablet 10 mg  10 mg Oral TID AC & HS Dereck Leep, MD      . morphine 2 MG/ML injection 2 mg  2 mg Intravenous Q2H PRN Dereck Leep, MD   2 mg at 02/21/16 1212  . multivitamin with minerals tablet 1 tablet  1 tablet Oral Daily Dereck Leep, MD      . ondansetron [facility]) tablet 4 mg  4 mg Oral Q6H PRN Dereck Leep, MD       Or  . ondansetron (ZOFRAN) injection 4 mg  4 mg Intravenous Q6H PRN Dereck Leep, MD   4 mg at 02/21/16 1212  . oxyCODONE (Oxy IR/ROXICODONE) immediate release tablet 5-10 mg  5-10 mg Oral Q4H PRN Dereck Leep, MD   5 mg at 02/21/16 1328  . pantoprazole (PROTONIX) EC tablet 40 mg  40 mg Oral BID Dereck Leep, MD      . pramipexole (MIRAPEX) tablet 0.5 mg  0.5 mg Oral QHS Dereck Leep, MD      . senna-docusate (Senokot-S) tablet 1 tablet  1 tablet Oral BID Dereck Leep, MD      . sodium phosphate (FLEET) 7-19 GM/118ML enema 1 enema  1 enema Rectal Once PRN Dereck Leep, MD      . traMADol Veatrice Bourbon) tablet 50-100 mg  50-100 mg Oral Q4H PRN Dereck Leep, MD         Discharge Medications: Please see discharge summary for a list of discharge medications.  Relevant Imaging Results:  Relevant Lab Results:   Additional Information  (SSN: [id number])  Sample, Veronia Beets, LCSW

## 2016-02-21 NOTE — Progress Notes (Signed)
Patient dangled tolerated well.

## 2016-02-21 NOTE — Transfer of Care (Signed)
Immediate Anesthesia Transfer of Care Note  Patient: Claudia Terry  Procedure(s) Performed: Procedure(s): TOTAL HIP REVISION (Left)  Patient Location: PACU  Anesthesia Type:General  Level of Consciousness: awake and patient cooperative  Airway & Oxygen Therapy: Patient Spontanous Breathing and Patient connected to face mask oxygen  Post-op Assessment: Report given to RN, Post -op Vital signs reviewed and stable and Patient moving all extremities  Post vital signs: Reviewed and stable  Last Vitals:  Vitals:   02/21/16 0617 02/21/16 1007  BP: (!) 152/93 119/71  Pulse: 73 71  Resp: 14 (!) 23  Temp: 37.1 C 36.6 C    Last Pain:  Vitals:   02/21/16 0617  TempSrc: Oral         Complications: No apparent anesthesia complications

## 2016-02-21 NOTE — Evaluation (Signed)
Physical Therapy Evaluation Patient Details Name: Claudia Terry MRN: [id number] DOB: [birth year] Today's Date: 02/21/2016   History of Present Illness  Pt admitted for L THR revision secondary to multiple dislocations. Orgininal Sx performed in 2012. Pt also with history of R THR in early 2017.   Clinical Impression  Pt is a pleasant 70 year old female who was admitted for L THR revision. Pt with complicated medical history with recurrent dislocations. Pt received supine in bed with L LE hip IR. At end of session, pt repositioned to promote neutral rotation. Heavy education performed on hip precautions. Pt only able to repeat 1/3.  Pt performs bed mobility with mod assist to sit at EOB. Secondary to severe dizziness after 2-3 minutes, pt returned back supine. Not appropriate for further transfers/ambulation at this time. Pt demonstrates deficits with strength/mobility/pain. Would benefit from skilled PT to address above deficits and promote optimal return to PLOF; recommend transition to STR upon discharge from acute hospitalization. Will continue to assess as needs change as pt prefers to go home with HHPT.      Follow Up Recommendations SNF    Equipment Recommendations       Recommendations for Other Services       Precautions / Restrictions Precautions Precautions: Fall;Posterior Hip Precaution Booklet Issued: No Restrictions Weight Bearing Restrictions: Yes LLE Weight Bearing: Weight bearing as tolerated      Mobility  Bed Mobility Overal bed mobility: Needs Assistance Bed Mobility: Supine to Sit     Supine to sit: Mod assist     General bed mobility comments: assist for very slow movement towards EOB. Pt able to assist with trunk, however needs extensive cues for sequencing of movement. Pt very limited by pain. Once seated at EOB, pt with severe dizziness  Transfers                 General transfer comment: not safe for transfers at this time secondary to  dizziness  Ambulation/Gait                Stairs            Wheelchair Mobility    Modified Rankin (Stroke Patients Only)       Balance Overall balance assessment: Needs assistance;History of Falls Sitting-balance support: Feet supported Sitting balance-Leahy Scale: Fair                                       Pertinent Vitals/Pain Pain Assessment: Faces Faces Pain Scale: Hurts worst Pain Location: L hip Pain Descriptors / Indicators: Operative site guarding;Penetrating Pain Intervention(s): Limited activity within patient's tolerance;RN gave pain meds during session;Repositioned    Home Living Family/patient expects to be discharged to:: Private residence Living Arrangements: Spouse/significant other;Parent Available Help at Discharge: Family Type of Home: House Home Access: Stairs to enter Entrance Stairs-Rails:  (has B rails but can't reach both ) Entrance Stairs-Number of Steps: 4 Home Layout: One level Home Equipment: Walker - 4 wheels;Cane - single point;Shower seat;Grab bars - tub/shower      Prior Function Level of Independence: Independent with assistive device(s)         Comments: retired Customer service manager        Extremity/Trunk Assessment   Upper Extremity Assessment: Overall WFL for tasks assessed           Lower Extremity Assessment: Generalized weakness (L  LE grossly 3/5; R LE grossly 4/5)         Communication   Communication: No difficulties  Cognition Arousal/Alertness: Awake/alert Behavior During Therapy: WFL for tasks assessed/performed Overall Cognitive Status: Within Functional Limits for tasks assessed                      General Comments      Exercises Other Exercises Other Exercises: Supine ther-ex performed on L LE including ankle pumps, quad sets, glut sets, and hip abd/add. All ther-ex performed x 10 reps with cues and correct technique. Needs min/mod assist for completion.  Extended time for education given for hip precautions and exercises.      Assessment/Plan    PT Assessment Patient needs continued PT services  PT Diagnosis Difficulty walking;Abnormality of gait;Generalized weakness;Acute pain   PT Problem List Decreased strength;Decreased balance;Decreased mobility;Pain  PT Treatment Interventions Gait training;Therapeutic exercise;DME instruction   PT Goals (Current goals can be found in the Care Plan section) Acute Rehab PT Goals Patient Stated Goal: to get stronger PT Goal Formulation: With patient Time For Goal Achievement: 03/06/16 Potential to Achieve Goals: Good    Frequency BID   Barriers to discharge        Co-evaluation               End of Session Equipment Utilized During Treatment: Gait belt Activity Tolerance: Patient limited by pain Patient left: in bed;with bed alarm set;with SCD's reapplied Nurse Communication: Mobility status         Time: [id number] PT Time Calculation (min) (ACUTE ONLY): 48 min   Charges:   PT Evaluation $PT Eval Moderate Complexity: 1 Procedure PT Treatments $Therapeutic Exercise: 23-37 mins   PT G Codes:        Claudia Terry 03/17/2016, 5:03 PM  Claudia Terry, PT, DPT [phone]

## 2016-02-22 ENCOUNTER — Encounter: Payer: Self-pay | Admitting: Orthopedic Surgery

## 2016-02-22 LAB — BASIC METABOLIC PANEL
Anion gap: 6 (ref 5–15)
BUN: 9 mg/dL (ref 6–20)
CO2: 24 mmol/L (ref 22–32)
Calcium: 8.8 mg/dL — ABNORMAL LOW (ref 8.9–10.3)
Chloride: 109 mmol/L (ref 101–111)
Creatinine, Ser: 0.61 mg/dL (ref 0.44–1.00)
GFR calc Af Amer: >60 mL/min (ref >60)
GFR calc non Af Amer: >60 mL/min (ref >60)
Glucose, Bld: 115 mg/dL — ABNORMAL HIGH (ref 65–99)
Potassium: 3.4 mmol/L — ABNORMAL LOW (ref 3.5–5.1)
Sodium: 139 mmol/L (ref 135–145)

## 2016-02-22 LAB — CBC
HCT: 37.4 % (ref 35.0–47.0)
Hemoglobin: 13.3 g/dL (ref 12.0–16.0)
MCH: 32.4 pg (ref 26.0–34.0)
MCHC: 35.5 g/dL (ref 32.0–36.0)
MCV: 91.3 fL (ref 80.0–100.0)
Platelets: 186 10*3/uL (ref 150–440)
RBC: 4.09 MIL/uL (ref 3.80–5.20)
RDW: 12.8 % (ref 11.5–14.5)
WBC: 14.4 10*3/uL — ABNORMAL HIGH (ref 3.6–11.0)

## 2016-02-22 MED ORDER — POTASSIUM CHLORIDE CRYS ER 20 MEQ PO TBCR
20.0000 meq | EXTENDED_RELEASE_TABLET | Freq: Three times a day (TID) | ORAL | Status: AC
  Administered 2016-02-22 (×3): 20 meq via ORAL
  Filled 2016-02-22 (×3): qty 1

## 2016-02-22 MED ORDER — OXYCODONE HCL 5 MG PO TABS: 5.0000 mg | ORAL_TABLET | ORAL | 0 refills | Status: DC | PRN

## 2016-02-22 MED ORDER — ENOXAPARIN SODIUM 40 MG/0.4ML ~~LOC~~ SOLN: 40.0000 mg | SUBCUTANEOUS | 0 refills | Status: DC

## 2016-02-22 NOTE — Evaluation (Signed)
Occupational Therapy Evaluation Patient Details Name: Claudia Terry MRN: [id number] DOB: Nov 05, 1945 Today's Date: 02/22/2016    History of Present Illness Pt. is a 70 y.o. female who was admitted for a Left THR. Pt. has a history of 2 previous Hip replacements.   Clinical Impression   Pt. Is a 70 y.o. Female who was admitted for a Left THR. Pt. has had 2 previous hip replacement surgeries. Pt. reports having all A/E, and DME at home. Pt. Reports having the equipment needed, and has a solid understanding of the proper A/E use. Pt. Is able to follow 3/3 Hip precautions. Pt. has support at home, as well as all necessary equipment and modifications in place. Pt. has OT needs met at this time. No further OT services are warranted. Pt. Is in agreement.     Follow Up Recommendations  No OT follow up    Equipment Recommendations       Recommendations for Other Services PT consult     Precautions / Restrictions Precautions Precautions: Fall;Posterior Hip Restrictions Weight Bearing Restrictions: Yes LLE Weight Bearing: Weight bearing as tolerated              ADL Overall ADL's : Needs assistance/impaired                                       General ADL Comments: Pt. has 4 reachers at home, as well as additional A/E for LE dressing. Pt. has a solid knowledge of the Proper use of the A/E for LE dressing.      Vision     Perception     Praxis      Pertinent Vitals/Pain Pain Assessment: 0-10 Pain Score: 4  Pain Location: Left Hip Pain Intervention(s): Limited activity within patient's tolerance;RN gave pain meds during session;Repositioned     Hand Dominance Right   Extremity/Trunk Assessment Upper Extremity Assessment Upper Extremity Assessment: Overall WFL for tasks assessed           Communication Communication Communication: No difficulties   Cognition Arousal/Alertness: Awake/alert Behavior During Therapy: WFL for tasks  assessed/performed Overall Cognitive Status: Within Functional Limits for tasks assessed                     General Comments       Exercises       Shoulder Instructions      Home Living Family/patient expects to be discharged to:: Private residence Living Arrangements: Spouse/significant other Available Help at Discharge: Family Type of Home: House Home Access: Stairs to enter Technical brewer of Steps: 4   Home Layout: One level     Bathroom Shower/Tub: Tub/shower unit         Home Equipment: Environmental consultant - 4 wheels;Walker - 2 wheels;Cane - single point;Bedside commode;Grab bars - tub/shower;Shower seat          Prior Functioning/Environment Level of Independence: Independent with assistive device(s)        Comments: retired Therapist, sports    OT Diagnosis: Generalized weakness   OT Problem List:     OT Treatment/Interventions:      OT Goals(Current goals can be found in the care plan section) Acute Rehab OT Goals Patient Stated Goal: To return home. OT Goal Formulation: With patient Potential to Achieve Goals: Good  OT Frequency:     Barriers to D/C:  Co-evaluation              End of Session    Activity Tolerance: Patient tolerated treatment well Patient left: in chair;with call bell/phone within reach;with chair alarm set   Time: 1115-1130 OT Time Calculation (min): 15 min Charges:  OT General Charges $OT Visit: 1 Procedure OT Evaluation $OT Eval Moderate Complexity: 1 Procedure G-Codes:    Harrel Carina, MS, OTR/L 02/22/2016, 11:39 AM

## 2016-02-22 NOTE — Progress Notes (Signed)
Pt is requesting that she get a extra dose of mirapex for her legs.  Dr Marry Guan did not want to order an extra dose of mirapex but said that the pt could go ahead and take her night time dose if she wanted.  Pt did say she wanted to go ahead and take her night time dose

## 2016-02-22 NOTE — Care Management Note (Addendum)
Case Management Note  Patient Details  Name: Claudia BIBEAULT MRN: [id number] Date of Birth: 1945/07/16  Subjective/Objective:    Spoke with patient and husband at the bedside for discharge planning. Patient has been recommended for SNF. Patient does not want ot go to SNF and stated that she has had 3 previous hip surgeries and is familiar with the healing process. Patient uses [address] [place], [place]             Lovenox 40mg  injection once daily for 14 days called to patient pharmacy.   Patient had a rolling walker and elevated toilet at home. Her husband is able to drive her and assisst her at home. Choice of [place] providers given and patient chose to go with [facility].    Action/Plan: Anticipated discharge is home with [place].    Expected Discharge Date:                  Expected Discharge Plan:     In-House Referral:     Discharge planning Services     Post Acute Care Choice:    Choice offered to:     DME Arranged:    DME Agency:     HH Arranged:    HH Agency:     Status of Service:     If discussed at [employer], dates discussed:    Additional Comments: Lovenox Co PAY $85.   Alvie Heidelberg, RN 02/22/2016, 1:05 PM

## 2016-02-22 NOTE — Progress Notes (Signed)
Physical Therapy Treatment Patient Details Name: CYDNEY LAFLAMME MRN: [id number] DOB: 12-29-1945 Today's Date: 02/22/2016    History of Present Illness Pt. is a 70 y.o. female who was admitted for a Left THR. Pt. has a history of 2 previous Hip replacements.    PT Comments    Pt notes increased pain this afternoon due to spasms that developed earlier. Pt notes receiving medication recently. Pt agreeable to PT. Pt continues to require heavy cueing throughout session and with all transfers, mobility and static positioning to adhere to left posterior hip precautions. Pt breaks hip flexion rule and internal rotation rule with activity despite consistent cueing. Pt notes ambulation does make spasms feel better, but fatigues quickly feeling she cannot ambulate further. Pt participates in several seated and long sit exercises, but does not feel able to tolerate stand exercises on the left at this time. Pt remains up in chair. Continue PT tomorrow for continued progress on strength, endurance and safety to all for improved functional mobility and prevent repeat dislocation.   Follow Up Recommendations  SNF     Equipment Recommendations       Recommendations for Other Services       Precautions / Restrictions Precautions Precautions: Fall;Posterior Hip Precaution Comments: Requires re education on posterior precauations with application Restrictions Weight Bearing Restrictions: Yes LLE Weight Bearing: Weight bearing as tolerated    Mobility  Bed Mobility Overal bed mobility: Needs Assistance Bed Mobility: Supine to Sit     Supine to sit: Min assist     General bed mobility comments: Not tested; up in chair  Transfers Overall transfer level: Needs assistance Equipment used: Rolling walker (2 wheeled) Transfers: Sit to/from Stand Sit to Stand: Min guard         General transfer comment: Continued cues to adhere to posterior hip precautions. Despite cues pt breaks just past 90  degrees initially with attempted stand  Ambulation/Gait Ambulation/Gait assistance: Min guard Ambulation Distance (Feet): 40 Feet Assistive device: Rolling walker (2 wheeled) Gait Pattern/deviations: Step-to pattern (toeing in bilaterally) Gait velocity: slow Gait velocity interpretation: <1.8 ft/sec, indicative of risk for recurrent falls General Gait Details: slow, stiff gait. Continual cues to avoid toe in bilaterally   Stairs            Wheelchair Mobility    Modified Rankin (Stroke Patients Only)       Balance Overall balance assessment: Needs assistance Sitting-balance support: Feet supported Sitting balance-Leahy Scale: Fair (cues for L knee lower than hip)     Standing balance support: Bilateral upper extremity supported Standing balance-Leahy Scale: Fair                      Cognition Arousal/Alertness: Awake/alert (Tired from poor nights sleep) Behavior During Therapy: WFL for tasks assessed/performed Overall Cognitive Status: Within Functional Limits for tasks assessed                      Exercises Total Joint Exercises Ankle Circles/Pumps: AROM;Both;20 reps (long sit) Quad Sets: Strengthening;Both;20 reps (long sit) Gluteal Sets: Strengthening;Both;20 reps (long sit) Heel Slides: AAROM;Left;10 reps (long sit) Hip ABduction/ADduction: AAROM;Left;10 reps (long sit) Long Arc Quad: AROM;Left;10 reps;Seated Other Exercises Other Exercises: B hip ER long sit 10x 2 sets Other Exercises: attempted stand exercise on L; pt too fatigued    General Comments        Pertinent Vitals/Pain Pain Assessment: 0-10 Pain Score: 9  Pain Location: L hip Pain Descriptors /  Indicators: Spasm;Squeezing Pain Intervention(s): Limited activity within patient's tolerance;Monitored during session;Premedicated before session;Ice applied    Home Living Family/patient expects to be discharged to:: Private residence Living Arrangements: Spouse/significant  other Available Help at Discharge: Family Type of Home: House Home Access: Stairs to enter   Home Layout: One level Home Equipment: Environmental consultant - 4 wheels;Walker - 2 wheels;Cane - single point;Bedside commode;Grab bars - tub/shower;Shower seat      Prior Function Level of Independence: Independent with assistive device(s)      Comments: retired Therapist, sports   PT Goals (current goals can now be found in the care plan section) Acute Rehab PT Goals Patient Stated Goal: To return home. Progress towards PT goals: Progressing toward goals (slowly)    Frequency  BID    PT Plan Current plan remains appropriate    Co-evaluation             End of Session Equipment Utilized During Treatment: Gait belt Activity Tolerance: Patient limited by fatigue;Patient limited by pain Patient left: in chair;with call bell/phone within reach (refused alarm; will call to get up)     Time: 1351-1420 PT Time Calculation (min) (ACUTE ONLY): 29 min  Charges:  $Gait Training: 8-22 mins $Therapeutic Exercise: 8-22 mins                    G CodesLarae Grooms, PTA 02/22/2016, 3:27 PM

## 2016-02-22 NOTE — Progress Notes (Signed)
   Subjective: 1 Day Post-Op Procedure(s) (LRB): TOTAL HIP REVISION (Left) Patient reports pain as mild.   Patient is well, and has had no acute complaints or problems We will start therapy today.  Plan is to go Home after hospital stay. no nausea and no vomiting Patient denies any chest pains or shortness of breath. One episode of dizziness yest. Ok today Complain of restless leg syndrome bothering her during the night Pt had foot pumps removed  Objective: Vital signs in last 24 hours: Temp:  [97.2 F (36.2 C)-98.4 F (36.9 C)] 98.3 F (36.8 C) (09/14 0332) Pulse Rate:  [59-91] 91 (09/14 0332) Resp:  [13-23] 18 (09/14 0332) BP: (103-142)/(62-89) 112/66 (09/14 0332) SpO2:  [89 %-100 %] 97 % (09/14 0332) well approximated incision Heels are non tender and elevated off the bed using rolled towels Intake/Output from previous day: 09/13 0701 - 09/14 0700 In: 3761.7 [P.O.:360; I.V.:3251.7; IV Piggyback:150] Out: [id number] [Urine:4375; Blood:50] Intake/Output this shift: No intake/output data recorded.   Recent Labs  02/22/16 0452  HGB 13.3    Recent Labs  02/22/16 0452  WBC 14.4*  RBC 4.09  HCT 37.4  PLT 186    Recent Labs  02/22/16 0452  NA 139  K 3.4*  CL 109  CO2 24  BUN 9  CREATININE 0.61  GLUCOSE 115*  CALCIUM 8.8*   No results for input(s): LABPT, INR in the last 72 hours.  EXAM General - Patient is Alert, Appropriate and Oriented Extremity - Neurologically intact Neurovascular intact Sensation intact distally Intact pulses distally Dorsiflexion/Plantar flexion intact Dressing - dressing C/D/I Motor Function - intact, moving foot and toes well on exam.    Past Medical History:  Diagnosis Date  . Arthritis   . 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 GERD (gastroesophageal reflux disease) 8/01    Dr. Tiffany Kocher  . Low back pain 11/95  . Menopause   . Restless leg syndrome 1999    Assessment/Plan: 1 Day  Post-Op Procedure(s) (LRB): TOTAL HIP REVISION (Left) Active Problems:   S/P total hip arthroplasty  Estimated body mass index is 29.05 kg/m as calculated from the following:   Height as of this encounter: 5\' 6"  (1.676 m).   Weight as of this encounter: 81.6 kg (180 lb). Advance diet Up with therapy Plan for discharge tomorrow Discharge home with home health  Labs: reviewed DVT Prophylaxis - Lovenox, Foot Pumps and TED hose Weight-Bearing as tolerated to left leg D/C O2 and Pulse OX and try on Room Air Begin working on a bowel movement Labs in am  [employer]. [place] 02/22/2016, 7:23 AM

## 2016-02-22 NOTE — Clinical Social Work Note (Signed)
Clinical Social Work Assessment  Patient Details  Name: Claudia Terry MRN: [id number] Date of Birth: 10/14/1945  Date of referral:  02/22/16               Reason for consult:  Facility Placement                Permission sought to share information with:    Permission granted to share information::     Name::        Agency::     Relationship::     Contact Information:     Housing/Transportation Living arrangements for the past 2 months:  Single Family Home Source of Information:  Patient Patient Interpreter Needed:  None Criminal Activity/Legal Involvement Pertinent to Current Situation/Hospitalization:  No - Comment as needed Significant Relationships:  Adult Children, Spouse, Friend Lives with:  Spouse Do you feel safe going back to the place where you live?  Yes Need for family participation in patient care:  Yes (Comment)  Care giving concerns:  Patient lives in [place] with her husband.    Social Worker assessment / plan: Clinical Social Worker (CSW) received SNF consult. PT is recommending SNF. CSW met with patient alone at bedside to discuss D/C plan. Patient was alert and oriented and was sitting up in the chair. CSW introduced self and explained role of CSW department. Patient reported that she lives in [place] with her husband and her 91 y.o mother. Per patient this is her 5th hip surgery and she is very familiar with the process and knows what to expect. CSW discussed SNF option. Patient declined SNF and reported that she is going home with [facility] home [facility]. Patient reported that she has lots of friends and family to provide support for her at home. RN case manager is aware of above. CSW will continue to follow and assist as needed.   Employment status:  Retired Insurance information:  Managed Medicare PT Recommendations:  Skilled Nursing Facility Information / Referral to community resources:  Other (Comment Required) (Patient refusing SNF and wants to go home  with home health. )  Patient/Family's Response to care:  Patient declined SNF and reported that she is going home with home health.   Patient/Family's Understanding of and Emotional Response to Diagnosis, Current Treatment, and Prognosis:  Patient was pleasant and thanked CSW for visit.   Emotional Assessment Appearance:  Appears stated age Attitude/Demeanor/Rapport:    Affect (typically observed):  Accepting, Adaptable, Pleasant Orientation:  Oriented to Self, Oriented to Place, Oriented to  Time, Oriented to Situation Alcohol / Substance use:  Not Applicable Psych involvement (Current and /or in the community):  No (Comment)  Discharge Needs  Concerns to be addressed:  Discharge Planning Concerns Readmission within the last 30 days:  No Current discharge risk:  Dependent with Mobility Barriers to Discharge:  Continued Medical Work up   Sample, Bailey M, LCSW 02/22/2016, 3:26 PM  

## 2016-02-22 NOTE — Discharge Summary (Signed)
Physician Discharge Summary  Patient ID: Claudia Terry MRN: [id number] DOB/AGE: [birth year] 70 y.o.  Admit date: 02/21/2016 Discharge date: 02/23/2016  Admission Diagnoses:  left hip dislocation   Discharge Diagnoses: Patient Active Problem List   Diagnosis Date Noted  . Failed total hip arthroplasty with dislocation ([place]) 12/01/2015  . S/P closed reduction of dislocated total hip prosthesis 08/08/2015  . S/P total hip arthroplasty 06/14/2015  . Benign essential HTN 12/28/2013  . Acid reflux 12/28/2013  . HLD (hyperlipidemia) 12/28/2013  . Restless leg 12/28/2013    Past Medical History:  Diagnosis Date  . Arthritis   . 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 GERD (gastroesophageal reflux disease) 8/01    Dr. Tiffany Kocher  . Low back pain 11/95  . Menopause   . Restless leg syndrome 1999     Transfusion: none   Consultants (if any): none  Discharged Condition: Improved  Hospital Course: Claudia Terry is an 70 y.o. female who was admitted 02/21/2016 with a diagnosis of recurrent dislocation of left hip s/p total hip arthroplasty and went to the operating room on 02/21/2016 and underwent the above named procedures.    Surgeries:Procedure(s): TOTAL HIP REVISION on 02/21/2016  PRE-OPERATIVE DIAGNOSIS: Recurrent dislocations of a left total hip arthroplasty  POST-OPERATIVE DIAGNOSIS:  Same  PROCEDURE:  Left total hip revision arthroplasty (conversion to a constrained polyethylene liner)  SURGEON:  Dereck Leep, Jr. M.D.  ASSISTANT:  Vance Peper, PA (present and scrubbed throughout the case, critical for assistance with exposure, retraction, instrumentation, and closure)  ANESTHESIA: general  ESTIMATED BLOOD LOSS: 50 mL  FLUIDS REPLACED: 1800 mL of crystalloid  DRAINS: None  IMPLANTS UTILIZED: DePuy +4 mm neutral Pinnacle constrained polyethylene insert and locking ring, and a 32 mm CoCr +9 mm hip ball  INDICATIONS FOR  SURGERY: Claudia Terry is a 70 y.o. year old female who had a remote left total hip arthroplasty and done well from number of years. However, she has now head 3 episodes of posterior dislocation of the left total hip despite bracing and physical therapy. Radiographs did not demonstrate any evidence of loosening or malposition. After discussion of the risks and benefits of surgical intervention, the patient expressed understanding of the risks benefits and agree with plans for total hip revision arthroplasty.   The risks, benefits, and alternatives were discussed at length including but not limited to the risks of infection, bleeding, nerve injury, stiffness, blood clots, the need for revision surgery, limb length inequality, dislocation, cardiopulmonary complications, among others, and they were willing to proceed. Patient tolerated the surgery well. No complications .Patient was taken to PACU where she was stabilized and then transferred to the orthopedic floor.  Patient started on Lovenox 30 q 12 hrs. Foot pumps applied bilaterally at 80 mm hg. Heels elevated off bed with rolled towels. No evidence of DVT. Calves non tender. Negative [place]oman. Physical therapy started on day #1 for gait training and transfer with OT starting on  day #1 for ADL and assisted devices. Patient has done well with therapy. Ambulated 200 feet upon being discharged. Was able to go up and down 4 steps without problem  Patient's IV and foley were discontinued on day one and hemovac was d/c on day #2. Dressing changed on day two   She was given perioperative antibiotics:  Anti-infectives    Start     Dose/Rate Route Frequency Ordered Stop   02/21/16 1400  clindamycin (CLEOCIN) IVPB 600 mg     600 mg 100 mL/hr over 30 Minutes Intravenous Every 6 hours 02/21/16 1154 02/22/16 1359   02/21/16 0600  clindamycin (CLEOCIN) 900 MG/50ML IVPB    Comments:  HENRY, LOIS: cabinet override      02/21/16 0600 02/21/16 0745   02/21/16  0020  clindamycin (CLEOCIN) IVPB 900 mg     900 mg 100 mL/hr over 30 Minutes Intravenous On call to O.R. 02/21/16 0020 02/21/16 0745    .  She was fitted with AV 1 compression foot pump devices, instructed on heel pumps,early ambulation, and fitted with TED stocking bilaterally  for DVT prophylaxis.  She benefited maximally from the hospital stay and there were no complications.    Recent vital signs:  Vitals:   02/21/16 2333 02/22/16 0332  BP: 103/62 112/66  Pulse: 84 91  Resp: 19 18  Temp: 98.3 F (36.8 C) 98.3 F (36.8 C)    Recent laboratory studies:  Lab Results  Component Value Date   HGB 13.3 02/22/2016   HGB 13.6 12/01/2015   HGB 13.1 08/08/2015   Lab Results  Component Value Date   WBC 14.4 (H) 02/22/2016   PLT 186 02/22/2016   Lab Results  Component Value Date   INR 0.95 02/07/2016   Lab Results  Component Value Date   NA 139 02/22/2016   K 3.4 (L) 02/22/2016   CL 109 02/22/2016   CO2 24 02/22/2016   BUN 9 02/22/2016   CREATININE 0.61 02/22/2016   GLUCOSE 115 (H) 02/22/2016    Discharge Medications:     Medication List    TAKE these medications   acetaminophen 500 MG tablet Commonly known as:  TYLENOL Take 1,000 mg by mouth every 6 (six) hours as needed for mild pain or headache.   atenolol 50 MG tablet Commonly known as:  TENORMIN Take 50 mg by mouth daily.   Calcium 600-400 MG-UNIT Chew Chew 2 tablets by mouth every morning.   Cinnamon 500 MG capsule Take 500 mg by mouth daily.   enoxaparin 40 MG/0.4ML injection Commonly known as:  LOVENOX Inject 0.4 mLs (40 mg total) into the skin daily.   Melatonin 10 MG Tabs Take 10 mg by mouth at bedtime.   miconazole 2 % cream Commonly known as:  MICOTIN Apply 1 application topically 2 (two) times daily.   MIRAPEX 0.5 MG tablet Generic drug:  pramipexole Take 0.5 mg by mouth at bedtime.   multivitamin with minerals Tabs tablet Take 1 tablet by mouth daily.   omeprazole 20 MG  capsule Commonly known as:  PRILOSEC Take 20 mg by mouth 2 (two) times daily before a meal.   oxyCODONE 5 MG immediate release tablet Commonly known as:  Oxy IR/ROXICODONE Take 1-2 tablets (5-10 mg total) by mouth every 4 (four) hours as needed for severe pain.   Red Yeast Rice 600 MG Tabs Take 1,200 mg by mouth daily.   traMADol 50 MG tablet Commonly known as:  ULTRAM Take 50 mg by mouth every 6 (six) hours as needed. 1-2 tablets every 4 hours as needed for pain.   Turmeric 500 MG Caps Take 500 mg by mouth daily.   Vitamin D3 5000 units Caps Take 5,000 Units by mouth daily.       Diagnostic Studies: Dg Hip Port Unilat With Pelvis 1v Left  Result Date: 02/21/2016 CLINICAL DATA:  Status post revision of left hip arthroplasty today. EXAM: DG HIP (WITH OR WITHOUT PELVIS) 1V  PORT LEFT COMPARISON:  Plain films left hip 12/01/2015. FINDINGS: Left hip arthroplasty is identified. The patient appears to have a new acetabular cup with a locking ring in place. Gas in the soft tissues and surgical staples are noted. No fracture or dislocation. Right total hip arthroplasty is unchanged. IMPRESSION: Revision of left hip arthroplasty without evidence complication. Electronically Signed   By: Inge Rise M.D.   On: 02/21/2016 11:16    Disposition: 01-Home or Self Care  Discharge Instructions    Diet - low sodium heart healthy    Complete by:  As directed    Increase activity slowly    Complete by:  As directed       Follow-up Information    Dereck Leep, MD On 04/02/2016.   Specialty:  Orthopedic Surgery Why:  at 2:15pm Contact information: [place] [address] [phone]            Signed: Watt Climes. 02/22/2016, 7:30 AM

## 2016-02-22 NOTE — Progress Notes (Signed)
Physical Therapy Treatment Patient Details Name: Claudia Terry MRN: [id number] DOB: 08-26-1945 Today's Date: 02/22/2016    History of Present Illness Pt. is a 70 y.o. female who was admitted for a Left THR. Pt. has a history of 2 previous Hip replacements.    PT Comments    Pt in bed; fatigued due to poor nights sleep, but agreeable to PT. Pt demonstrates good effort with movement; however, requires continual cueing/tactile cues to adhere to posterior hip precautions with bed mobility, transfers and ambulation. Decreased physical assist to get to edge of bed. Dizziness denied. Pt progressing ambulation distance. Pt participated in several basic exercises in the chair and education on Left lower extremity positioning to avoid internal rotation and educated on active external rotation. Plan to see pt this afternoon to continue strength, safety and functional mobility.   Follow Up Recommendations  SNF     Equipment Recommendations       Recommendations for Other Services       Precautions / Restrictions Precautions Precautions: Fall;Posterior Hip Precaution Comments: Requires re education on posterior precauations with application Restrictions Weight Bearing Restrictions: Yes LLE Weight Bearing: Weight bearing as tolerated    Mobility  Bed Mobility Overal bed mobility: Needs Assistance Bed Mobility: Supine to Sit     Supine to sit: Min assist     General bed mobility comments: Slow and Min A/cues to adhere to posterior hip precautions  Transfers Overall transfer level: Needs assistance Equipment used: Rolling walker (2 wheeled) Transfers: Sit to/from Stand Sit to Stand: Min guard         General transfer comment: Cues/tactile assist to adhere to posterior precautions  Ambulation/Gait Ambulation/Gait assistance: Min guard Ambulation Distance (Feet): 40 Feet Assistive device: Rolling walker (2 wheeled) Gait Pattern/deviations: Step-to pattern;Decreased weight shift  to left;Decreased step length - left;Antalgic;Decreased step length - right (stiff leg gait on L) Gait velocity: slow Gait velocity interpretation: <1.8 ft/sec, indicative of risk for recurrent falls General Gait Details: slow, stiff gait. Cues to avoid toe in on L especially with turns   Financial trader Rankin (Stroke Patients Only)       Balance Overall balance assessment: Needs assistance Sitting-balance support: Feet supported Sitting balance-Leahy Scale: Fair (cues for L knee lower than hip)     Standing balance support: Bilateral upper extremity supported Standing balance-Leahy Scale: Fair                      Cognition Arousal/Alertness: Awake/alert (Tired from poor nights sleep) Behavior During Therapy: WFL for tasks assessed/performed Overall Cognitive Status: Within Functional Limits for tasks assessed                      Exercises Total Joint Exercises Ankle Circles/Pumps: AROM;Both;20 reps (long sit) Quad Sets: Strengthening;Both;20 reps (long sit) Gluteal Sets: Strengthening;Both;20 reps (long sit) Other Exercises Other Exercises: L hip ER; initially passive to AAROM. 20x    General Comments        Pertinent Vitals/Pain Pain Assessment: 0-10 Pain Score: 5  Pain Location: L hip Pain Descriptors / Indicators: Aching;Constant;Discomfort;Operative site guarding Pain Intervention(s): Limited activity within patient's tolerance;Monitored during session;Premedicated before session;Repositioned    Home Living Family/patient expects to be discharged to:: Private residence Living Arrangements: Spouse/significant other Available Help at Discharge: Family Type of Home: House Home Access: Stairs to enter   [place]: One level  Home Equipment: [place] - 4 wheels;Walker - 2 wheels;Cane - single point;Bedside commode;Grab bars - tub/shower;Shower seat      Prior Function Level of Independence:  Independent with assistive device(s)      Comments: retired Therapist, sports   PT Goals (current goals can now be found in the care plan section) Acute Rehab PT Goals Patient Stated Goal: To return home. Progress towards PT goals: Progressing toward goals    Frequency  BID    PT Plan Current plan remains appropriate    Co-evaluation             End of Session Equipment Utilized During Treatment: Gait belt Activity Tolerance: Patient tolerated treatment well;Patient limited by fatigue Patient left: in chair;with call bell/phone within reach (refused alarm; will call to get up)     Time: [id number] PT Time Calculation (min) (ACUTE ONLY): 27 min  Charges:  $Gait Training: 8-22 mins $Therapeutic Exercise: 8-22 mins                    G Codes:      Larae Grooms, PTA 02/22/2016, 1:01 PM

## 2016-02-23 LAB — CBC
HCT: 42.7 % (ref 35.0–47.0)
Hemoglobin: 14.8 g/dL (ref 12.0–16.0)
MCH: 32 pg (ref 26.0–34.0)
MCHC: 34.6 g/dL (ref 32.0–36.0)
MCV: 92.5 fL (ref 80.0–100.0)
Platelets: 196 10*3/uL (ref 150–440)
RBC: 4.62 MIL/uL (ref 3.80–5.20)
RDW: 13 % (ref 11.5–14.5)
WBC: 12.9 10*3/uL — ABNORMAL HIGH (ref 3.6–11.0)

## 2016-02-23 LAB — BASIC METABOLIC PANEL
Anion gap: 11 (ref 5–15)
BUN: 7 mg/dL (ref 6–20)
CO2: 24 mmol/L (ref 22–32)
Calcium: 9.3 mg/dL (ref 8.9–10.3)
Chloride: 104 mmol/L (ref 101–111)
Creatinine, Ser: 0.78 mg/dL (ref 0.44–1.00)
GFR calc Af Amer: >60 mL/min (ref >60)
GFR calc non Af Amer: >60 mL/min (ref >60)
Glucose, Bld: 149 mg/dL — ABNORMAL HIGH (ref 65–99)
Potassium: 3.9 mmol/L (ref 3.5–5.1)
Sodium: 139 mmol/L (ref 135–145)

## 2016-02-23 NOTE — Care Management Important Message (Signed)
Important Message  Patient Details  Name: Claudia Terry MRN: [id number] Date of Birth: [birth year]   Medicare Important Message Given:  N/A - LOS <3 / Initial given by admissions    Alvie Heidelberg, RN 02/23/2016, 10:52 AM

## 2016-02-23 NOTE — Progress Notes (Signed)
Physical Therapy Treatment Patient Details Name: Claudia Terry MRN: [id number] DOB: 1945-07-28 Today's Date: 02/23/2016    History of Present Illness Pt. is a 70 y.o. female who was admitted for a Left THR. Pt. has a history of 2 previous Hip replacements.    PT Comments    Pt is able to ambulate much better this AM than any previous PT session and showed greatly improved confidence, speed and gait.  She continues to have some toe-in posturing and did need increased cuing to insure that she is within precaution limitations.  Spent some extra time educating on precautions: positioning/functional applications.  She was able to negotiate up/down steps w/o direct assist and showed good safety and positioning awareness after initial cuing.  Pt with much better session and should be safe to go home at this time.   Follow Up Recommendations  SNF     Equipment Recommendations       Recommendations for Other Services       Precautions / Restrictions Precautions Precautions: Fall;Posterior Hip Precaution Comments: Pt continues to need cuing for positioning and precautions Restrictions Weight Bearing Restrictions: Yes LLE Weight Bearing: Weight bearing as tolerated    Mobility  Bed Mobility               General bed mobility comments: Not tested; up in chair  Transfers Overall transfer level: Modified independent Equipment used: Rolling walker (2 wheeled) Transfers: Sit to/from Stand Sit to Stand: Min guard         General transfer comment: Pt needing confermation and education on proper positioning/sequencing but able to rise w/o direct physical assist  Ambulation/Gait Ambulation/Gait assistance: Supervision Ambulation Distance (Feet): 250 Feet Assistive device: Rolling walker (2 wheeled)       General Gait Details: Pt initially slow and hesistant, but quickly becomes more comfortable and able to maintain consistent forward motion and generally shows good confidence.   She does not have any spasming or increased pain and ultimately did much better than previous attempts.    Stairs Stairs: Yes Stairs assistance: Supervision Stair Management: One rail Left Number of Stairs: 8 General stair comments: Pt needing only minimal cuing/reminders for appropriate positioning/sequencing  Wheelchair Mobility    Modified Rankin (Stroke Patients Only)       Balance                                    Cognition Arousal/Alertness: Awake/alert Behavior During Therapy: WFL for tasks assessed/performed Overall Cognitive Status: Within Functional Limits for tasks assessed                      Exercises Total Joint Exercises Ankle Circles/Pumps: AROM;Both;20 reps Quad Sets: Strengthening;Both;20 reps Gluteal Sets: Strengthening;Both;20 reps Hip ABduction/ADduction: 20 reps;AAROM;AROM (with hip in long sitting and with light rotational seated) Long Arc Quad: Left;10 reps;Seated;Strengthening Knee Flexion: AROM;10 reps    General Comments        Pertinent Vitals/Pain Pain Score: 3  Pain Location: L hip    Home Living                      Prior Function            PT Goals (current goals can now be found in the care plan section) Acute Rehab PT Goals Patient Stated Goal: To return home. Progress towards PT goals: Progressing toward goals  Frequency   PT Diagnosis  BID   S/P total hip arthroplasty - Plan: DG Hip Port Unilat With Pelvis 1V Left, DG Hip Port Unilat With Pelvis 1V Left     PT Plan Current plan remains appropriate    Co-evaluation             End of Session Equipment Utilized During Treatment: Gait belt Activity Tolerance: Patient limited by fatigue;Patient limited by pain Patient left: in chair;with call bell/phone within reach     Time: 0910-0950 PT Time Calculation (min) (ACUTE ONLY): 40 min  Charges:  $Gait Training: 8-22 mins $Therapeutic Exercise: 23-37 mins                     G Codes:      Kreg Shropshire, DPT 02/23/2016, 10:56 AM

## 2016-02-23 NOTE — Discharge Instructions (Signed)

## 2016-02-23 NOTE — Progress Notes (Signed)
   Subjective: 2 Days Post-Op Procedure(s) (LRB): TOTAL HIP REVISION (Left) Patient reports pain as 0 on 0-10 scale.   Patient is well, and has had no acute complaints or problems Continue with physical  therapy today.  Plan is to go Home after hospital stay. no nausea and no vomiting Patient denies any chest pains or shortness of breath. Objective: Vital signs in last 24 hours: Temp:  [97.8 F (36.6 C)-98.5 F (36.9 C)] 98.5 F (36.9 C) (09/15 0418) Pulse Rate:  [71-99] 98 (09/15 0418) Resp:  [16-18] 18 (09/15 0418) BP: (99-128)/(61-80) 128/80 (09/15 0418) SpO2:  [94 %-99 %] 96 % (09/15 0418) well approximated incision Heels are non tender and elevated off the bed using rolled towels Intake/Output from previous day: 09/14 0701 - 09/15 0700 In: 480 [P.O.:480] Out: 530 [Urine:530] Intake/Output this shift: No intake/output data recorded.   Recent Labs  02/22/16 0452 02/23/16 0502  HGB 13.3 14.8    Recent Labs  02/22/16 0452 02/23/16 0502  WBC 14.4* 12.9*  RBC 4.09 4.62  HCT 37.4 42.7  PLT 186 196    Recent Labs  02/22/16 0452 02/23/16 0502  NA 139 139  K 3.4* 3.9  CL 109 104  CO2 24 24  BUN 9 7  CREATININE 0.61 0.78  GLUCOSE 115* 149*  CALCIUM 8.8* 9.3   No results for input(s): LABPT, INR in the last 72 hours.  EXAM General - Patient is Alert, Appropriate and Oriented Extremity - Neurologically intact Neurovascular intact Sensation intact distally Intact pulses distally Dorsiflexion/Plantar flexion intact Dressing - scant drainage Motor Function - intact, moving foot and toes well on exam.    Past Medical History:  Diagnosis Date  . Arthritis   . 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 GERD (gastroesophageal reflux disease) 8/01    Dr. Tiffany Kocher  . Low back pain 11/95  . Menopause   . Restless leg syndrome 1999    Assessment/Plan: 2 Days Post-Op Procedure(s) (LRB): TOTAL HIP REVISION (Left) Active  Problems:   S/P total hip arthroplasty  Estimated body mass index is 29.05 kg/m as calculated from the following:   Height as of this encounter: 5\' 6"  (1.676 m).   Weight as of this encounter: 81.6 kg (180 lb). Up with therapy Plan for discharge tomorrow Discharge home with home health  Labs: reviewed DVT Prophylaxis - Lovenox, Foot Pumps and TED hose Weight-Bearing as tolerated to left leg Follow up in 6 weeks  Collene Massimino R. [place] 02/23/2016, 7:32 AM

## 2016-02-23 NOTE — Progress Notes (Signed)
Discharge summary reviewed with verbal understanding. Advised pt to call office for staple removal. VSS at this time. 1 narcotic RX given at the time of discharge. Voiced no concerns at this time.

## 2016-02-24 DIAGNOSIS — T84021D Dislocation of internal left hip prosthesis, subsequent encounter: Secondary | ICD-10-CM | POA: Diagnosis not present

## 2016-02-24 DIAGNOSIS — G4730 Sleep apnea, unspecified: Secondary | ICD-10-CM | POA: Diagnosis not present

## 2016-02-24 DIAGNOSIS — Z79891 Long term (current) use of opiate analgesic: Secondary | ICD-10-CM | POA: Diagnosis not present

## 2016-02-24 DIAGNOSIS — K219 Gastro-esophageal reflux disease without esophagitis: Secondary | ICD-10-CM | POA: Diagnosis not present

## 2016-02-24 DIAGNOSIS — G2581 Restless legs syndrome: Secondary | ICD-10-CM | POA: Diagnosis not present

## 2016-02-24 DIAGNOSIS — Z9181 History of falling: Secondary | ICD-10-CM | POA: Diagnosis not present

## 2016-02-24 DIAGNOSIS — E785 Hyperlipidemia, unspecified: Secondary | ICD-10-CM | POA: Diagnosis not present

## 2016-02-24 DIAGNOSIS — Z7901 Long term (current) use of anticoagulants: Secondary | ICD-10-CM | POA: Diagnosis not present

## 2016-02-24 DIAGNOSIS — Z96641 Presence of right artificial hip joint: Secondary | ICD-10-CM | POA: Diagnosis not present

## 2016-02-24 DIAGNOSIS — I10 Essential (primary) hypertension: Secondary | ICD-10-CM | POA: Diagnosis not present

## 2016-02-26 LAB — SURGICAL PATHOLOGY

## 2016-03-04 DIAGNOSIS — Z7901 Long term (current) use of anticoagulants: Secondary | ICD-10-CM | POA: Diagnosis not present

## 2016-03-04 DIAGNOSIS — K219 Gastro-esophageal reflux disease without esophagitis: Secondary | ICD-10-CM | POA: Diagnosis not present

## 2016-03-04 DIAGNOSIS — Z79891 Long term (current) use of opiate analgesic: Secondary | ICD-10-CM | POA: Diagnosis not present

## 2016-03-04 DIAGNOSIS — Z9181 History of falling: Secondary | ICD-10-CM | POA: Diagnosis not present

## 2016-03-04 DIAGNOSIS — I10 Essential (primary) hypertension: Secondary | ICD-10-CM | POA: Diagnosis not present

## 2016-03-04 DIAGNOSIS — T84021D Dislocation of internal left hip prosthesis, subsequent encounter: Secondary | ICD-10-CM | POA: Diagnosis not present

## 2016-03-04 DIAGNOSIS — G4730 Sleep apnea, unspecified: Secondary | ICD-10-CM | POA: Diagnosis not present

## 2016-03-04 DIAGNOSIS — G2581 Restless legs syndrome: Secondary | ICD-10-CM | POA: Diagnosis not present

## 2016-03-04 DIAGNOSIS — E785 Hyperlipidemia, unspecified: Secondary | ICD-10-CM | POA: Diagnosis not present

## 2016-03-04 DIAGNOSIS — Z96641 Presence of right artificial hip joint: Secondary | ICD-10-CM | POA: Diagnosis not present

## 2016-03-12 DIAGNOSIS — Z96641 Presence of right artificial hip joint: Secondary | ICD-10-CM | POA: Diagnosis not present

## 2016-03-12 DIAGNOSIS — Z79891 Long term (current) use of opiate analgesic: Secondary | ICD-10-CM | POA: Diagnosis not present

## 2016-03-12 DIAGNOSIS — T84021D Dislocation of internal left hip prosthesis, subsequent encounter: Secondary | ICD-10-CM | POA: Diagnosis not present

## 2016-03-12 DIAGNOSIS — E785 Hyperlipidemia, unspecified: Secondary | ICD-10-CM | POA: Diagnosis not present

## 2016-03-12 DIAGNOSIS — Z7901 Long term (current) use of anticoagulants: Secondary | ICD-10-CM | POA: Diagnosis not present

## 2016-03-12 DIAGNOSIS — K219 Gastro-esophageal reflux disease without esophagitis: Secondary | ICD-10-CM | POA: Diagnosis not present

## 2016-03-12 DIAGNOSIS — Z9181 History of falling: Secondary | ICD-10-CM | POA: Diagnosis not present

## 2016-03-12 DIAGNOSIS — G2581 Restless legs syndrome: Secondary | ICD-10-CM | POA: Diagnosis not present

## 2016-03-12 DIAGNOSIS — I10 Essential (primary) hypertension: Secondary | ICD-10-CM | POA: Diagnosis not present

## 2016-03-12 DIAGNOSIS — G4730 Sleep apnea, unspecified: Secondary | ICD-10-CM | POA: Diagnosis not present

## 2016-03-20 DIAGNOSIS — Z1231 Encounter for screening mammogram for malignant neoplasm of breast: Secondary | ICD-10-CM | POA: Diagnosis not present

## 2016-04-02 ENCOUNTER — Encounter: Payer: Self-pay | Admitting: Nurse Practitioner

## 2016-04-02 DIAGNOSIS — Z96642 Presence of left artificial hip joint: Secondary | ICD-10-CM | POA: Diagnosis not present

## 2016-04-02 DIAGNOSIS — T84021D Dislocation of internal left hip prosthesis, subsequent encounter: Secondary | ICD-10-CM | POA: Diagnosis not present

## 2016-04-05 ENCOUNTER — Ambulatory Visit (INDEPENDENT_AMBULATORY_CARE_PROVIDER_SITE_OTHER): Payer: PPO | Admitting: Nurse Practitioner

## 2016-04-05 ENCOUNTER — Encounter: Payer: Self-pay | Admitting: Nurse Practitioner

## 2016-04-05 VITALS — BP 120/68 | HR 68 | Resp 16 | Ht 65.0 in | Wt 175.0 lb

## 2016-04-05 DIAGNOSIS — Z01419 Encounter for gynecological examination (general) (routine) without abnormal findings: Secondary | ICD-10-CM | POA: Diagnosis not present

## 2016-04-05 DIAGNOSIS — Z0000 Encounter for general adult medical examination without abnormal findings: Secondary | ICD-10-CM | POA: Diagnosis not present

## 2016-04-05 DIAGNOSIS — Z1159 Encounter for screening for other viral diseases: Secondary | ICD-10-CM

## 2016-04-05 NOTE — Patient Instructions (Addendum)

## 2016-04-05 NOTE — Progress Notes (Signed)
70 y.o. G2P2002 Married  Caucasian Fe here for annual exam.  Dislocated left TAH X 3 and had to undergo a revision the left hip replacement with a polyethylene liner on 02/21/16.  She walks with a can.  She previously had right hip replacement done on 06/14/15 and that one is doing OK.   Husband has a recurrence of his prostate cancer.  First time treated with  cryo and now MRI shows no spread to other areas. Now will be doing radiation.  Mother in now 24 and lives with them.  She officially retired from nursing in 2015.  Patient's last menstrual period was 02/08/2005 (approximate).          Sexually active: Yes.    The current method of family planning is vasectomy.    Exercising: Yes.    walking Smoker:  no  Health Maintenance: Pap:  02-08-15 neg MMG:  03-20-16 category a density bi rads 1:neg Colonoscopy:  2013 neg will get recheck in 2018 BMD:   2012 TDaP:  2006 Shingles: had done 02/09/2015 Pneumonia: 02/09/2015 Hep C: done today Labs: PCP Self breast exam: done monthly   reports that she has never smoked. She has never used smokeless tobacco. She reports that she does not drink alcohol or use drugs.  Past Medical History:  Diagnosis Date  . Arthritis   . 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 GERD (gastroesophageal reflux disease) 8/01    Dr. Tiffany Kocher  . Low back pain 11/95  . Menopause   . Restless leg syndrome 1999    Past Surgical History:  Procedure Laterality Date  . [place]   X2  . COLONOSCOPY  12/06/11   normal  . ESOPHAGOGASTRODUODENOSCOPY (EGD) WITH PROPOFOL N/A 02/23/2015   Procedure: ESOPHAGOGASTRODUODENOSCOPY (EGD) WITH PROPOFOL;  Surgeon: Manya Silvas, MD;  Location: [facility];  Service: Endoscopy;  Laterality: N/A;  . HIP CLOSED REDUCTION Left 08/08/2015   Procedure: CLOSED REDUCTION HIP;  Surgeon: Dereck Leep, MD;  Location: [facility];  Service: Orthopedics;  Laterality: Left;  . HIP CLOSED  REDUCTION Left 09/18/2015   Procedure: CLOSED REDUCTION HIP;  Surgeon: Hessie Knows, MD;  Location: [facility];  Service: Orthopedics;  Laterality: Left;  . HIP CLOSED REDUCTION Left 12/01/2015   Procedure: CLOSED REDUCTION HIP;  Surgeon: Dereck Leep, MD;  Location: [facility];  Service: Orthopedics;  Laterality: Left;  no prep no incision  . KNEE ARTHROSCOPY Right 11/14/2014   Procedure: ARTHROSCOPY KNEE;  Surgeon: Dereck Leep, MD;  Location: [facility];  Service: Orthopedics;  Laterality: Right;  Posterior horn menicus, anterior lateral menicus grade 3 chondromalacia  . phlebitis groin    . TONSILLECTOMY    . TOTAL HIP ARTHROPLASTY Left 05/08/11   necrosis of hip  . TOTAL HIP ARTHROPLASTY Right 06/14/2015   Procedure: TOTAL HIP ARTHROPLASTY;  Surgeon: Dereck Leep, MD;  Location: [facility];  Service: Orthopedics;  Laterality: Right;  . TOTAL HIP REVISION Left 02/21/2016   Procedure: TOTAL HIP REVISION;  Surgeon: Dereck Leep, MD;  Location: [facility];  Service: Orthopedics;  Laterality: Left;    Current Outpatient Prescriptions  Medication Sig Dispense Refill  . acetaminophen (TYLENOL) 500 MG tablet Take 1,000 mg by mouth every 6 (six) hours as needed for mild pain or headache.     Marland Kitchen atenolol (TENORMIN) 50 MG tablet Take 50 mg by mouth daily.     Marland Kitchen  Calcium 600-400 MG-UNIT CHEW Chew 2 tablets by mouth every morning.    . Cholecalciferol (VITAMIN D3) 5000 units CAPS Take 5,000 Units by mouth daily.    . Cinnamon 500 MG capsule Take 500 mg by mouth daily.    Marland Kitchen enoxaparin (LOVENOX) 40 MG/0.4ML injection Inject 0.4 mLs (40 mg total) into the skin daily. 14 Syringe 0  . Melatonin 10 MG TABS Take 10 mg by mouth at bedtime.    . miconazole (MICOTIN) 2 % cream Apply 1 application topically 2 (two) times daily.    . Multiple Vitamin (MULTIVITAMIN WITH MINERALS) TABS tablet Take 1 tablet by mouth daily.    Marland Kitchen omeprazole (PRILOSEC) 20 MG capsule Take 20 mg by mouth 2 (two) times daily before a meal.    .  oxyCODONE (OXY IR/ROXICODONE) 5 MG immediate release tablet Take 1-2 tablets (5-10 mg total) by mouth every 4 (four) hours as needed for severe pain. 30 tablet 0  . pramipexole (MIRAPEX) 0.5 MG tablet Take 0.5 mg by mouth at bedtime.     . Red Yeast Rice 600 MG TABS Take 1,200 mg by mouth daily.     . traMADol (ULTRAM) 50 MG tablet Take 50-100 mg by mouth every 4 (four) hours as needed for moderate pain. 1-2 tablets every 4 hours as needed for pain.     . Turmeric 500 MG CAPS Take 500 mg by mouth daily.      No current facility-administered medications for this visit.     Family History  Problem Relation Age of Onset  . Diabetes Mother   . Diabetes Maternal Grandfather   . Hypertension Maternal Grandfather   . Ovarian cancer Paternal Aunt   . Cancer - Lung Paternal Uncle     ROS:  Pertinent items are noted in HPI.  Otherwise, a comprehensive ROS was negative.  Exam:   LMP 02/08/2005 (Approximate)    Ht Readings from Last 3 Encounters:  02/21/16 5\' 6"  (1.676 m)  02/07/16 5\' 6"  (1.676 m)  12/01/15 5\' 6"  (1.676 m)    General appearance: alert, cooperative and appears stated age Head: Normocephalic, without obvious abnormality, atraumatic Neck: no adenopathy, supple, symmetrical, trachea midline and thyroid normal to inspection and palpation Lungs: clear to auscultation bilaterally Breasts: normal appearance, no masses or tenderness Heart: regular rate and rhythm Abdomen: soft, non-tender; no masses,  no organomegaly Extremities: extremities normal, atraumatic, no cyanosis or edema Skin: Skin color, texture, turgor normal. No rashes or lesions Lymph nodes: Cervical, supraclavicular, and axillary nodes normal. No abnormal inguinal nodes palpated Neurologic: Grossly normal   Pelvic: External genitalia:  no lesions              Urethra:  normal appearing urethra with no masses, tenderness or lesions              Bartholin's and Skene's: normal                 Vagina: normal  appearing vagina with normal color and discharge, no lesions              Cervix: anteverted              Pap taken: No. Bimanual Exam:  Uterus:  normal size, contour, position, consistency, mobility, non-tender              Adnexa: no mass, fullness, tenderness               Rectovaginal: Confirms  Anus:  normal sphincter tone, no lesions  Chaperone present: yes  A:  Well Woman with normal exam       Postmenopausal off HRT fall 2012              OA with left hip replacement 04/2011 and dislocation X 3 - now a revision 02/21/16  OA with right hip replacement 06/2015             Right knee scope 11/14/2014             Vit D deficiency, HTN, GERD< RLS   P:   Reviewed health and wellness pertinent to exam  Pap smear not done  Mammogram is due 11/18  Will follow with labs  Counseled on breast self exam, mammography screening, adequate intake of calcium and vitamin D, diet and exercise, Kegel's exercises return annually or prn  An After Visit Summary was printed and given to the patient.

## 2016-04-06 LAB — HEPATITIS C ANTIBODY: HCV Ab: NEGATIVE

## 2016-04-07 NOTE — Progress Notes (Signed)
Encounter reviewed by Dr. Brook Amundson C. Silva.  

## 2016-05-31 DIAGNOSIS — M722 Plantar fascial fibromatosis: Secondary | ICD-10-CM | POA: Diagnosis not present

## 2016-06-11 DIAGNOSIS — M25561 Pain in right knee: Secondary | ICD-10-CM | POA: Diagnosis not present

## 2016-06-11 DIAGNOSIS — M170 Bilateral primary osteoarthritis of knee: Secondary | ICD-10-CM | POA: Diagnosis not present

## 2016-06-11 DIAGNOSIS — M25562 Pain in left knee: Secondary | ICD-10-CM | POA: Diagnosis not present

## 2016-07-23 DIAGNOSIS — M1712 Unilateral primary osteoarthritis, left knee: Secondary | ICD-10-CM | POA: Diagnosis not present

## 2016-08-06 DIAGNOSIS — M170 Bilateral primary osteoarthritis of knee: Secondary | ICD-10-CM | POA: Diagnosis not present

## 2016-08-06 DIAGNOSIS — E7800 Pure hypercholesterolemia, unspecified: Secondary | ICD-10-CM | POA: Diagnosis not present

## 2016-08-06 DIAGNOSIS — R739 Hyperglycemia, unspecified: Secondary | ICD-10-CM | POA: Diagnosis not present

## 2016-08-06 DIAGNOSIS — Z79899 Other long term (current) drug therapy: Secondary | ICD-10-CM | POA: Diagnosis not present

## 2016-08-12 DIAGNOSIS — K219 Gastro-esophageal reflux disease without esophagitis: Secondary | ICD-10-CM | POA: Diagnosis not present

## 2016-08-12 DIAGNOSIS — G2581 Restless legs syndrome: Secondary | ICD-10-CM | POA: Diagnosis not present

## 2016-08-12 DIAGNOSIS — I10 Essential (primary) hypertension: Secondary | ICD-10-CM | POA: Diagnosis not present

## 2016-08-12 DIAGNOSIS — R739 Hyperglycemia, unspecified: Secondary | ICD-10-CM | POA: Diagnosis not present

## 2016-08-12 DIAGNOSIS — Z79899 Other long term (current) drug therapy: Secondary | ICD-10-CM | POA: Diagnosis not present

## 2016-08-12 DIAGNOSIS — E7800 Pure hypercholesterolemia, unspecified: Secondary | ICD-10-CM | POA: Diagnosis not present

## 2016-08-13 DIAGNOSIS — M1712 Unilateral primary osteoarthritis, left knee: Secondary | ICD-10-CM | POA: Diagnosis not present

## 2016-08-13 DIAGNOSIS — M170 Bilateral primary osteoarthritis of knee: Secondary | ICD-10-CM | POA: Diagnosis not present

## 2016-08-20 DIAGNOSIS — M170 Bilateral primary osteoarthritis of knee: Secondary | ICD-10-CM | POA: Diagnosis not present

## 2016-09-11 DIAGNOSIS — M1712 Unilateral primary osteoarthritis, left knee: Secondary | ICD-10-CM | POA: Diagnosis not present

## 2016-10-01 DIAGNOSIS — M25662 Stiffness of left knee, not elsewhere classified: Secondary | ICD-10-CM | POA: Diagnosis not present

## 2016-10-01 DIAGNOSIS — M25661 Stiffness of right knee, not elsewhere classified: Secondary | ICD-10-CM | POA: Diagnosis not present

## 2016-10-01 DIAGNOSIS — M25562 Pain in left knee: Secondary | ICD-10-CM | POA: Diagnosis not present

## 2016-10-01 DIAGNOSIS — M25561 Pain in right knee: Secondary | ICD-10-CM | POA: Diagnosis not present

## 2016-10-07 DIAGNOSIS — M25562 Pain in left knee: Secondary | ICD-10-CM | POA: Diagnosis not present

## 2016-10-07 DIAGNOSIS — M25661 Stiffness of right knee, not elsewhere classified: Secondary | ICD-10-CM | POA: Diagnosis not present

## 2016-10-07 DIAGNOSIS — M25561 Pain in right knee: Secondary | ICD-10-CM | POA: Diagnosis not present

## 2016-10-07 DIAGNOSIS — M25662 Stiffness of left knee, not elsewhere classified: Secondary | ICD-10-CM | POA: Diagnosis not present

## 2016-10-08 HISTORY — PX: JOINT REPLACEMENT: SHX530

## 2016-10-10 DIAGNOSIS — M1712 Unilateral primary osteoarthritis, left knee: Secondary | ICD-10-CM | POA: Diagnosis not present

## 2016-10-17 ENCOUNTER — Encounter
Admission: RE | Admit: 2016-10-17 | Discharge: 2016-10-17 | Disposition: A | Discharge status: Home / Self Care | Payer: PPO | Source: Ambulatory Visit | Attending: Orthopedic Surgery | Admitting: Orthopedic Surgery

## 2016-10-17 DIAGNOSIS — Z01810 Encounter for preprocedural cardiovascular examination: Secondary | ICD-10-CM | POA: Insufficient documentation

## 2016-10-17 DIAGNOSIS — Z01812 Encounter for preprocedural laboratory examination: Secondary | ICD-10-CM | POA: Insufficient documentation

## 2016-10-17 DIAGNOSIS — L600 Ingrowing nail: Secondary | ICD-10-CM | POA: Diagnosis not present

## 2016-10-17 DIAGNOSIS — I10 Essential (primary) hypertension: Secondary | ICD-10-CM | POA: Diagnosis not present

## 2016-10-17 DIAGNOSIS — R234 Changes in skin texture: Secondary | ICD-10-CM | POA: Diagnosis not present

## 2016-10-17 LAB — URINALYSIS, ROUTINE W REFLEX MICROSCOPIC
Bilirubin Urine: NEGATIVE
Glucose, UA: NEGATIVE mg/dL
Hgb urine dipstick: NEGATIVE
Ketones, ur: NEGATIVE mg/dL
Leukocytes, UA: NEGATIVE
Nitrite: NEGATIVE
Protein, ur: NEGATIVE mg/dL
Specific Gravity, Urine: 1.014 (ref 1.005–1.030)
pH: 7 (ref 5.0–8.0)

## 2016-10-17 LAB — CBC
HCT: 40.6 % (ref 35.0–47.0)
Hemoglobin: 14 g/dL (ref 12.0–16.0)
MCH: 32.6 pg (ref 26.0–34.0)
MCHC: 34.4 g/dL (ref 32.0–36.0)
MCV: 94.6 fL (ref 80.0–100.0)
Platelets: 223 10*3/uL (ref 150–440)
RBC: 4.29 MIL/uL (ref 3.80–5.20)
RDW: 12.8 % (ref 11.5–14.5)
WBC: 8.6 10*3/uL (ref 3.6–11.0)

## 2016-10-17 LAB — TYPE AND SCREEN
ABO/RH(D): O POS
Antibody Screen: NEGATIVE

## 2016-10-17 LAB — COMPREHENSIVE METABOLIC PANEL
ALT: 19 U/L (ref 14–54)
AST: 21 U/L (ref 15–41)
Albumin: 4.4 g/dL (ref 3.5–5.0)
Alkaline Phosphatase: 66 U/L (ref 38–126)
Anion gap: 10 (ref 5–15)
BUN: 14 mg/dL (ref 6–20)
CO2: 27 mmol/L (ref 22–32)
Calcium: 9.7 mg/dL (ref 8.9–10.3)
Chloride: 100 mmol/L — ABNORMAL LOW (ref 101–111)
Creatinine, Ser: 0.5 mg/dL (ref 0.44–1.00)
GFR calc Af Amer: >60 mL/min (ref >60)
GFR calc non Af Amer: >60 mL/min (ref >60)
Glucose, Bld: 92 mg/dL (ref 65–99)
Potassium: 3.7 mmol/L (ref 3.5–5.1)
Sodium: 137 mmol/L (ref 135–145)
Total Bilirubin: 0.8 mg/dL (ref 0.3–1.2)
Total Protein: 7.5 g/dL (ref 6.5–8.1)

## 2016-10-17 LAB — SURGICAL PCR SCREEN
MRSA, PCR: NEGATIVE
Staphylococcus aureus: NEGATIVE

## 2016-10-17 LAB — SEDIMENTATION RATE: Sed Rate: 16 mm/hr (ref 0–30)

## 2016-10-17 LAB — PROTIME-INR
INR: 1.01
Prothrombin Time: 13.3 seconds (ref 11.4–15.2)

## 2016-10-17 LAB — C-REACTIVE PROTEIN: CRP: <0.8 mg/dL (ref <1.0)

## 2016-10-17 LAB — APTT: aPTT: 30 seconds (ref 24–36)

## 2016-10-17 NOTE — Patient Instructions (Signed)
Your procedure is scheduled on: Oct 28, 2016 MONDAY Su procedimiento est programado para: Report to [place] COME THRU REVOLVING DOOR Presntese a: To find out your arrival time please call [phone] between 1PM - 3PM on FRIDAY  Oct 25, 2016 Para saber su hora de llegada por favor llame al ([place]:  Remember: Instructions that are not followed completely may result in serious medical risk, up to and including death, or upon the discretion of your surgeon and anesthesiologist your surgery may need to be rescheduled.  Recuerde: Las instrucciones que no se siguen completamente Heritage manager en un riesgo de salud grave, incluyendo hasta la [place] o a discrecin de su cirujano y Environmental health practitioner, su ciruga se puede posponer.   _X___ 1. Do not eat food or drink liquids after midnight. No gum chewing or hard candies.  No coma alimentos ni tome lquidos despus de la medianoche.  No mastique chicle ni caramelos  duros.     __X__ 2. No alcohol for 24 hours before or after surgery.    No tome alcohol durante las 24 horas antes ni despus de la [place].   __X__ 3. Bring all medications with you on the day of surgery if instructed. BRING ANY NEW MEDICATION TO HOSPITAL    Lleve todos los medicamentos con usted el da de su ciruga si se le ha indicado as.   _X___ 4. Notify your doctor if there is any change in your medical condition (cold, fever,                             infections).    Informe a su mdico si hay algn cambio en su condicin mdica (resfriado, fiebre, infecciones).   Do not wear jewelry, make-up, hairpins, clips or nail polish.  No use joyas, maquillajes, pinzas/ganchos para el cabello ni esmalte de uas.  Do not wear lotions, powders, or perfumes. You may NOTwear deodorant.  No use lociones, polvos o perfumes.  Puede usar desodorante.    Do not shave 48 hours prior to surgery. Men may shave face and neck.  No se afeite 48  horas antes de la [place].  Los hombres pueden [employer] cara y el cuello.   Do not bring valuables to the hospital.   No lleve objetos [place] is not responsible for any belongings or valuables.  [place] no se hace responsable de ningn tipo de pertenencias u objetos de Geographical information systems officer.               Contacts, dentures or bridgework may not be worn into surgery.  Los lentes de [place], las dentaduras postizas o puentes no se pueden usar en la [place].  Leave your suitcase in the car. After surgery it may be brought to your room.  Deje su maleta en el auto.  Despus de la ciruga podr traerla a su habitacin.  For patients admitted to the hospital, discharge time is determined by your treatment team.  Para los pacientes que sean ingresados al hospital, el tiempo en el cual se le dar de alta es determinado por su                equipo de [place].   Patients discharged the day of surgery will not be allowed to drive home. A los pacientes que se les da de alta el mismo da de la  ciruga no se les permitir conducir a casa.   Please read over the following fact sheets that you were given: Por favor [place] informacin que le dieron:   CHG INSTRUCTIONS AND MRSA EDUCATION SHEET   _X___ Take these medicines the morning of surgery with A SIP OF WATER:          [employer] estas medicinas la maana de la ciruga con UN SORBO DE AGUA:  1. ATENOLOL  2. OMEPRAZOLE DOSE NIGHT BEFORE AND MORNING OF SURGERY  3.   4.       5.  6.  ____ Fleet Enema (as directed)          Enema de Fleet (segn lo indicado)    _X__ Use CHG Soap as directed          Utilice el jabn de CHG segn lo indicado  ____ Use inhalers on the day of surgery          Use los inhaladores el da de la ciruga  ____ Stop metformin 2 days prior to surgery          Deje de tomar el metformin 2 das antes de la ciruga    ____ Take 1/2 of usual insulin dose the night before surgery  and none on the morning of surgery           Tome la mitad de la dosis habitual de insulina la noche antes de la [place] y no tome nada en la maana de la             ciruga  ____ Stop Coumadin/Plavix/aspirin on           Deje de tomar el Coumadin/Plavix/aspirina el da:  __X_ Stop Anti-inflammatories on  ANAPROX, ALEVE, IBUPROFEN, ADVIL , MOTRIN, GOODYS POWDER - ONLY TAKE TYLENOL          Deje de tomar antiinflamatorios el da:   __X__ Stop supplements SEVEN DAYS BEFORE SURGERY - RED YEAST RICE AND MELATONIN          Deje de tomar suplementos hasta despus de la ciruga  ____ Bring C-Pap to the hospital          [place] al hospital

## 2016-10-19 LAB — URINE CULTURE
Culture: NO GROWTH
Special Requests: NORMAL

## 2016-10-27 MED ORDER — TRANEXAMIC ACID 1000 MG/10ML IV SOLN
1000.0000 mg | INTRAVENOUS | Status: DC
  Filled 2016-10-27: qty 10

## 2016-10-27 MED ORDER — CLINDAMYCIN PHOSPHATE 900 MG/50ML IV SOLN: 900.0000 mg | INTRAVENOUS | Status: DC

## 2016-10-28 ENCOUNTER — Encounter: Payer: Self-pay | Admitting: Orthopedic Surgery

## 2016-10-28 ENCOUNTER — Inpatient Hospital Stay: Payer: PPO | Admitting: Certified Registered"

## 2016-10-28 ENCOUNTER — Inpatient Hospital Stay: Payer: PPO

## 2016-10-28 ENCOUNTER — Encounter
Admission: RE | Disposition: A | Discharge status: Home Health | Payer: Self-pay | Source: Ambulatory Visit | Attending: Orthopedic Surgery

## 2016-10-28 ENCOUNTER — Inpatient Hospital Stay
Admission: RE | Admit: 2016-10-28 | Discharge: 2016-10-30 | DRG: 470 | Disposition: A | Discharge status: Home Health | Payer: PPO | Source: Ambulatory Visit | Attending: Orthopedic Surgery | Admitting: Orthopedic Surgery

## 2016-10-28 DIAGNOSIS — Z471 Aftercare following joint replacement surgery: Secondary | ICD-10-CM | POA: Diagnosis not present

## 2016-10-28 DIAGNOSIS — M25762 Osteophyte, left knee: Secondary | ICD-10-CM | POA: Diagnosis not present

## 2016-10-28 DIAGNOSIS — M1712 Unilateral primary osteoarthritis, left knee: Principal | ICD-10-CM | POA: Diagnosis present

## 2016-10-28 DIAGNOSIS — K219 Gastro-esophageal reflux disease without esophagitis: Secondary | ICD-10-CM | POA: Diagnosis present

## 2016-10-28 DIAGNOSIS — Z96652 Presence of left artificial knee joint: Secondary | ICD-10-CM

## 2016-10-28 DIAGNOSIS — I10 Essential (primary) hypertension: Secondary | ICD-10-CM | POA: Diagnosis present

## 2016-10-28 DIAGNOSIS — E876 Hypokalemia: Secondary | ICD-10-CM | POA: Diagnosis not present

## 2016-10-28 DIAGNOSIS — M25562 Pain in left knee: Secondary | ICD-10-CM | POA: Diagnosis not present

## 2016-10-28 DIAGNOSIS — G2581 Restless legs syndrome: Secondary | ICD-10-CM | POA: Diagnosis not present

## 2016-10-28 DIAGNOSIS — Z96659 Presence of unspecified artificial knee joint: Secondary | ICD-10-CM

## 2016-10-28 HISTORY — PX: KNEE ARTHROPLASTY: SHX992

## 2016-10-28 SURGERY — ARTHROPLASTY, KNEE, TOTAL, USING IMAGELESS COMPUTER-ASSISTED NAVIGATION
Anesthesia: General | Site: Knee | Laterality: Left | Wound class: Clean

## 2016-10-28 MED ORDER — PANTOPRAZOLE SODIUM 40 MG PO TBEC
40.0000 mg | DELAYED_RELEASE_TABLET | Freq: Two times a day (BID) | ORAL | Status: DC
  Administered 2016-10-28 – 2016-10-30 (×4): 40 mg via ORAL
  Filled 2016-10-28 (×4): qty 1

## 2016-10-28 MED ORDER — CELECOXIB 200 MG PO CAPS
200.0000 mg | ORAL_CAPSULE | Freq: Two times a day (BID) | ORAL | Status: DC
  Administered 2016-10-28 – 2016-10-30 (×4): 200 mg via ORAL
  Filled 2016-10-28 (×4): qty 1

## 2016-10-28 MED ORDER — MORPHINE SULFATE (PF) 2 MG/ML IV SOLN
2.0000 mg | INTRAVENOUS | Status: DC | PRN
  Administered 2016-10-29: 2 mg via INTRAVENOUS
  Filled 2016-10-28 (×2): qty 1

## 2016-10-28 MED ORDER — FLEET ENEMA 7-19 GM/118ML RE ENEM: 1.0000 | ENEMA | Freq: Once | RECTAL | Status: DC | PRN

## 2016-10-28 MED ORDER — CHLORHEXIDINE GLUCONATE 4 % EX LIQD: 60.0000 mL | Freq: Once | CUTANEOUS | Status: DC

## 2016-10-28 MED ORDER — OXYCODONE HCL 5 MG PO TABS
5.0000 mg | ORAL_TABLET | ORAL | Status: DC | PRN
  Administered 2016-10-28 – 2016-10-30 (×9): 10 mg via ORAL
  Filled 2016-10-28 (×9): qty 2

## 2016-10-28 MED ORDER — CALCIUM-PHOSPHORUS-VITAMIN D 250-107-500 MG-MG-UNIT PO CHEW: 1.0000 | CHEWABLE_TABLET | Freq: Every day | ORAL | Status: DC

## 2016-10-28 MED ORDER — METOCLOPRAMIDE HCL 10 MG PO TABS
10.0000 mg | ORAL_TABLET | Freq: Three times a day (TID) | ORAL | Status: DC
  Administered 2016-10-28 – 2016-10-30 (×6): 10 mg via ORAL
  Filled 2016-10-28 (×6): qty 1

## 2016-10-28 MED ORDER — TRAMADOL HCL 50 MG PO TABS
50.0000 mg | ORAL_TABLET | ORAL | Status: DC | PRN
  Administered 2016-10-29 – 2016-10-30 (×3): 100 mg via ORAL
  Filled 2016-10-28 (×3): qty 2

## 2016-10-28 MED ORDER — FENTANYL CITRATE (PF) 100 MCG/2ML IJ SOLN
INTRAMUSCULAR | Status: AC
  Administered 2016-10-28: 25 ug via INTRAVENOUS
  Filled 2016-10-28: qty 2

## 2016-10-28 MED ORDER — BUPIVACAINE LIPOSOME 1.3 % IJ SUSP
INTRAMUSCULAR | Status: AC
  Filled 2016-10-28: qty 20

## 2016-10-28 MED ORDER — DEXAMETHASONE SODIUM PHOSPHATE 10 MG/ML IJ SOLN
INTRAMUSCULAR | Status: AC
  Filled 2016-10-28: qty 1

## 2016-10-28 MED ORDER — SEVOFLURANE IN SOLN
RESPIRATORY_TRACT | Status: AC
  Filled 2016-10-28: qty 250

## 2016-10-28 MED ORDER — ACETAMINOPHEN 10 MG/ML IV SOLN
INTRAVENOUS | Status: AC
  Filled 2016-10-28: qty 100

## 2016-10-28 MED ORDER — CALCIUM CARBONATE-VITAMIN D 500-200 MG-UNIT PO TABS
1.0000 | ORAL_TABLET | Freq: Every day | ORAL | Status: DC
  Administered 2016-10-29 – 2016-10-30 (×2): 1 via ORAL
  Filled 2016-10-28 (×2): qty 1

## 2016-10-28 MED ORDER — DEXAMETHASONE SODIUM PHOSPHATE 10 MG/ML IJ SOLN
INTRAMUSCULAR | Status: DC | PRN
  Administered 2016-10-28: 5 mg via INTRAVENOUS

## 2016-10-28 MED ORDER — HYDROMORPHONE HCL 1 MG/ML IJ SOLN
INTRAMUSCULAR | Status: DC | PRN
  Administered 2016-10-28 (×2): 0.5 mg via INTRAVENOUS

## 2016-10-28 MED ORDER — LIDOCAINE HCL (CARDIAC) 20 MG/ML IV SOLN
INTRAVENOUS | Status: DC | PRN
  Administered 2016-10-28: 50 mg via INTRAVENOUS

## 2016-10-28 MED ORDER — SODIUM CHLORIDE 0.9 % IJ SOLN
INTRAMUSCULAR | Status: AC
  Filled 2016-10-28: qty 50

## 2016-10-28 MED ORDER — LACTATED RINGERS IV SOLN
INTRAVENOUS | Status: DC
  Administered 2016-10-28 (×2): via INTRAVENOUS

## 2016-10-28 MED ORDER — HYDROMORPHONE HCL 1 MG/ML IJ SOLN
INTRAMUSCULAR | Status: AC
  Filled 2016-10-28: qty 1

## 2016-10-28 MED ORDER — MIDAZOLAM HCL 2 MG/2ML IJ SOLN
INTRAMUSCULAR | Status: DC | PRN
  Administered 2016-10-28: 2 mg via INTRAVENOUS

## 2016-10-28 MED ORDER — PHENOL 1.4 % MT LIQD
1.0000 | OROMUCOSAL | Status: DC | PRN
  Filled 2016-10-28: qty 177

## 2016-10-28 MED ORDER — CLINDAMYCIN PHOSPHATE 900 MG/50ML IV SOLN
INTRAVENOUS | Status: DC | PRN
  Administered 2016-10-28: 900 mg via INTRAVENOUS

## 2016-10-28 MED ORDER — FENTANYL CITRATE (PF) 100 MCG/2ML IJ SOLN
INTRAMUSCULAR | Status: DC | PRN
  Administered 2016-10-28: 50 ug via INTRAVENOUS
  Administered 2016-10-28: 100 ug via INTRAVENOUS
  Administered 2016-10-28 (×2): 50 ug via INTRAVENOUS

## 2016-10-28 MED ORDER — BUPIVACAINE HCL (PF) 0.25 % IJ SOLN
INTRAMUSCULAR | Status: DC | PRN
  Administered 2016-10-28: 60 mL

## 2016-10-28 MED ORDER — SENNOSIDES-DOCUSATE SODIUM 8.6-50 MG PO TABS
1.0000 | ORAL_TABLET | Freq: Two times a day (BID) | ORAL | Status: DC
  Administered 2016-10-28 – 2016-10-30 (×4): 1 via ORAL
  Filled 2016-10-28 (×4): qty 1

## 2016-10-28 MED ORDER — NEOMYCIN-POLYMYXIN B GU 40-200000 IR SOLN
Status: AC
  Filled 2016-10-28: qty 20

## 2016-10-28 MED ORDER — ENOXAPARIN SODIUM 30 MG/0.3ML ~~LOC~~ SOLN
30.0000 mg | Freq: Two times a day (BID) | SUBCUTANEOUS | Status: DC
  Administered 2016-10-29 – 2016-10-30 (×3): 30 mg via SUBCUTANEOUS
  Filled 2016-10-28 (×3): qty 0.3

## 2016-10-28 MED ORDER — ACETAMINOPHEN 325 MG PO TABS: 650.0000 mg | ORAL_TABLET | Freq: Four times a day (QID) | ORAL | Status: DC | PRN

## 2016-10-28 MED ORDER — CLINDAMYCIN PHOSPHATE 900 MG/50ML IV SOLN
INTRAVENOUS | Status: AC
  Filled 2016-10-28: qty 50

## 2016-10-28 MED ORDER — PRAMIPEXOLE DIHYDROCHLORIDE 0.25 MG PO TABS
0.5000 mg | ORAL_TABLET | Freq: Every day | ORAL | Status: DC
  Administered 2016-10-28 – 2016-10-29 (×2): 0.5 mg via ORAL
  Filled 2016-10-28 (×2): qty 2

## 2016-10-28 MED ORDER — ALUM & MAG HYDROXIDE-SIMETH 200-200-20 MG/5ML PO SUSP: 30.0000 mL | ORAL | Status: DC | PRN

## 2016-10-28 MED ORDER — LIDOCAINE HCL (PF) 2 % IJ SOLN
INTRAMUSCULAR | Status: AC
  Filled 2016-10-28: qty 2

## 2016-10-28 MED ORDER — BISACODYL 10 MG RE SUPP
10.0000 mg | Freq: Every day | RECTAL | Status: DC | PRN
  Administered 2016-10-30: 10 mg via RECTAL
  Filled 2016-10-28: qty 1

## 2016-10-28 MED ORDER — CLINDAMYCIN PHOSPHATE 600 MG/50ML IV SOLN
600.0000 mg | Freq: Four times a day (QID) | INTRAVENOUS | Status: AC
  Administered 2016-10-28 – 2016-10-29 (×4): 600 mg via INTRAVENOUS
  Filled 2016-10-28 (×4): qty 50

## 2016-10-28 MED ORDER — PROPOFOL 10 MG/ML IV BOLUS
INTRAVENOUS | Status: DC | PRN
  Administered 2016-10-28: 150 mg via INTRAVENOUS

## 2016-10-28 MED ORDER — ONDANSETRON HCL 4 MG/2ML IJ SOLN
INTRAMUSCULAR | Status: AC
  Filled 2016-10-28: qty 2

## 2016-10-28 MED ORDER — ACETAMINOPHEN 10 MG/ML IV SOLN
INTRAVENOUS | Status: DC | PRN
  Administered 2016-10-28: 1000 mg via INTRAVENOUS

## 2016-10-28 MED ORDER — ATENOLOL 50 MG PO TABS
50.0000 mg | ORAL_TABLET | Freq: Every day | ORAL | Status: DC
  Administered 2016-10-29 – 2016-10-30 (×2): 50 mg via ORAL
  Filled 2016-10-28 (×2): qty 1

## 2016-10-28 MED ORDER — NEOMYCIN-POLYMYXIN B GU 40-200000 IR SOLN
Status: DC | PRN
  Administered 2016-10-28: 14 mL

## 2016-10-28 MED ORDER — ONDANSETRON HCL 4 MG/2ML IJ SOLN
INTRAMUSCULAR | Status: DC | PRN
  Administered 2016-10-28: 4 mg via INTRAVENOUS

## 2016-10-28 MED ORDER — MAGNESIUM HYDROXIDE 400 MG/5ML PO SUSP
30.0000 mL | Freq: Every day | ORAL | Status: DC | PRN
  Administered 2016-10-29 – 2016-10-30 (×2): 30 mL via ORAL
  Filled 2016-10-28 (×2): qty 30

## 2016-10-28 MED ORDER — PROMETHAZINE HCL 25 MG/ML IJ SOLN
INTRAMUSCULAR | Status: AC
  Administered 2016-10-28: 12.5 mg via INTRAVENOUS
  Filled 2016-10-28: qty 1

## 2016-10-28 MED ORDER — SODIUM CHLORIDE 0.9 % IV SOLN
INTRAVENOUS | Status: DC | PRN
  Administered 2016-10-28: 60 mL

## 2016-10-28 MED ORDER — ROCURONIUM BROMIDE 100 MG/10ML IV SOLN
INTRAVENOUS | Status: DC | PRN
  Administered 2016-10-28: 40 mg via INTRAVENOUS
  Administered 2016-10-28: 10 mg via INTRAVENOUS

## 2016-10-28 MED ORDER — BUPIVACAINE HCL (PF) 0.25 % IJ SOLN
INTRAMUSCULAR | Status: AC
  Filled 2016-10-28: qty 60

## 2016-10-28 MED ORDER — TRANEXAMIC ACID 1000 MG/10ML IV SOLN
1000.0000 mg | Freq: Once | INTRAVENOUS | Status: AC
  Administered 2016-10-28: 1000 mg via INTRAVENOUS
  Filled 2016-10-28: qty 10

## 2016-10-28 MED ORDER — ROCURONIUM BROMIDE 50 MG/5ML IV SOLN
INTRAVENOUS | Status: AC
  Filled 2016-10-28: qty 1

## 2016-10-28 MED ORDER — FENTANYL CITRATE (PF) 100 MCG/2ML IJ SOLN
25.0000 ug | INTRAMUSCULAR | Status: DC | PRN
  Administered 2016-10-28 (×3): 25 ug via INTRAVENOUS

## 2016-10-28 MED ORDER — MIDAZOLAM HCL 2 MG/2ML IJ SOLN
INTRAMUSCULAR | Status: AC
  Filled 2016-10-28: qty 2

## 2016-10-28 MED ORDER — PROPOFOL 10 MG/ML IV BOLUS
INTRAVENOUS | Status: AC
  Filled 2016-10-28: qty 20

## 2016-10-28 MED ORDER — PHENYLEPHRINE HCL 10 MG/ML IJ SOLN
INTRAMUSCULAR | Status: DC | PRN
  Administered 2016-10-28 (×2): 100 ug via INTRAVENOUS

## 2016-10-28 MED ORDER — ONDANSETRON HCL 4 MG PO TABS: 4.0000 mg | ORAL_TABLET | Freq: Four times a day (QID) | ORAL | Status: DC | PRN

## 2016-10-28 MED ORDER — VITAMIN D 1000 UNITS PO TABS
2000.0000 [IU] | ORAL_TABLET | Freq: Every day | ORAL | Status: DC
  Administered 2016-10-29 – 2016-10-30 (×2): 2000 [IU] via ORAL
  Filled 2016-10-28 (×2): qty 2

## 2016-10-28 MED ORDER — HAIR SKIN NAILS PO CAPS: ORAL_CAPSULE | Freq: Two times a day (BID) | ORAL | Status: DC

## 2016-10-28 MED ORDER — ONDANSETRON HCL 4 MG/2ML IJ SOLN
INTRAMUSCULAR | Status: AC
  Administered 2016-10-28: 4 mg via INTRAVENOUS
  Filled 2016-10-28: qty 2

## 2016-10-28 MED ORDER — KETAMINE HCL 10 MG/ML IJ SOLN
INTRAMUSCULAR | Status: DC | PRN
  Administered 2016-10-28 (×2): 25 mg via INTRAVENOUS

## 2016-10-28 MED ORDER — ACETAMINOPHEN 650 MG RE SUPP: 650.0000 mg | Freq: Four times a day (QID) | RECTAL | Status: DC | PRN

## 2016-10-28 MED ORDER — FERROUS SULFATE 325 (65 FE) MG PO TABS
325.0000 mg | ORAL_TABLET | Freq: Two times a day (BID) | ORAL | Status: DC
  Administered 2016-10-29 – 2016-10-30 (×3): 325 mg via ORAL
  Filled 2016-10-28 (×3): qty 1

## 2016-10-28 MED ORDER — PROMETHAZINE HCL 25 MG/ML IJ SOLN
12.5000 mg | Freq: Once | INTRAMUSCULAR | Status: AC
  Administered 2016-10-28: 12.5 mg via INTRAVENOUS

## 2016-10-28 MED ORDER — TRANEXAMIC ACID 1000 MG/10ML IV SOLN
INTRAVENOUS | Status: DC | PRN
  Administered 2016-10-28: 1000 mg via INTRAVENOUS

## 2016-10-28 MED ORDER — SODIUM CHLORIDE 0.9 % IV SOLN
INTRAVENOUS | Status: DC
  Administered 2016-10-28 – 2016-10-29 (×2): via INTRAVENOUS

## 2016-10-28 MED ORDER — SODIUM CHLORIDE 0.9 % IJ SOLN
INTRAMUSCULAR | Status: AC
  Filled 2016-10-28: qty 20

## 2016-10-28 MED ORDER — ONDANSETRON HCL 4 MG/2ML IJ SOLN
4.0000 mg | Freq: Once | INTRAMUSCULAR | Status: AC | PRN
  Administered 2016-10-28: 4 mg via INTRAVENOUS

## 2016-10-28 MED ORDER — ONDANSETRON HCL 4 MG/2ML IJ SOLN: 4.0000 mg | Freq: Four times a day (QID) | INTRAMUSCULAR | Status: DC | PRN

## 2016-10-28 MED ORDER — MENTHOL 3 MG MT LOZG
1.0000 | LOZENGE | OROMUCOSAL | Status: DC | PRN
  Filled 2016-10-28: qty 9

## 2016-10-28 MED ORDER — ACETAMINOPHEN 10 MG/ML IV SOLN
1000.0000 mg | Freq: Four times a day (QID) | INTRAVENOUS | Status: AC
Start: 2016-10-28 — End: 2016-10-29
  Administered 2016-10-28 – 2016-10-29 (×4): 1000 mg via INTRAVENOUS
  Filled 2016-10-28 (×4): qty 100

## 2016-10-28 MED ORDER — FENTANYL CITRATE (PF) 250 MCG/5ML IJ SOLN
INTRAMUSCULAR | Status: AC
  Filled 2016-10-28: qty 5

## 2016-10-28 MED ORDER — DIPHENHYDRAMINE HCL 12.5 MG/5ML PO ELIX: 12.5000 mg | ORAL_SOLUTION | ORAL | Status: DC | PRN

## 2016-10-28 SURGICAL SUPPLY — 66 items
BATTERY INSTRU NAVIGATION (MISCELLANEOUS) ×12 IMPLANT
BLADE SAW 1 (BLADE) ×3 IMPLANT
BLADE SAW 1/2 (BLADE) ×3 IMPLANT
BLADE SAW 70X12.5 (BLADE) IMPLANT
BONE CEMENT GENTAMICIN (Cement) ×6 IMPLANT
BTRY SRG DRVR LF (MISCELLANEOUS) ×4
CANISTER SUCT 1200ML W/VALVE (MISCELLANEOUS) ×3 IMPLANT
CANISTER SUCT 3000ML PPV (MISCELLANEOUS) ×6 IMPLANT
CAPT KNEE TOTAL 3 ATTUNE ×2 IMPLANT
CATH TRAY METER 16FR LF (MISCELLANEOUS) ×3 IMPLANT
CEMENT BONE GENTAMICIN 40 (Cement) IMPLANT
COOLER POLAR GLACIER W/PUMP (MISCELLANEOUS) ×3 IMPLANT
CUFF TOURN 24 STER (MISCELLANEOUS) IMPLANT
CUFF TOURN 30 STER DUAL PORT (MISCELLANEOUS) ×2 IMPLANT
DRAPE SHEET LG 3/4 BI-LAMINATE (DRAPES) ×3 IMPLANT
DRSG DERMACEA 8X12 NADH (GAUZE/BANDAGES/DRESSINGS) ×3 IMPLANT
DRSG OPSITE POSTOP 4X14 (GAUZE/BANDAGES/DRESSINGS) ×3 IMPLANT
DRSG TEGADERM 4X4.75 (GAUZE/BANDAGES/DRESSINGS) ×3 IMPLANT
DURAPREP 26ML APPLICATOR (WOUND CARE) ×6 IMPLANT
ELECT CAUTERY BLADE 6.4 (BLADE) ×3 IMPLANT
ELECT REM PT RETURN 9FT ADLT (ELECTROSURGICAL) ×3
ELECTRODE REM PT RTRN 9FT ADLT (ELECTROSURGICAL) ×1 IMPLANT
EX-PIN ORTHOLOCK NAV 4X150 (PIN) ×6 IMPLANT
GLOVE BIOGEL M STRL SZ7.5 (GLOVE) ×6 IMPLANT
GLOVE BIOGEL PI IND STRL 9 (GLOVE) ×1 IMPLANT
GLOVE BIOGEL PI INDICATOR 9 (GLOVE) ×2
GLOVE INDICATOR 8.0 STRL GRN (GLOVE) ×3 IMPLANT
GLOVE SURG SYN 9.0  PF PI (GLOVE) ×2
GLOVE SURG SYN 9.0 PF PI (GLOVE) ×1 IMPLANT
GOWN STRL REUS W/ TWL LRG LVL3 (GOWN DISPOSABLE) ×2 IMPLANT
GOWN STRL REUS W/TWL 2XL LVL3 (GOWN DISPOSABLE) ×3 IMPLANT
GOWN STRL REUS W/TWL LRG LVL3 (GOWN DISPOSABLE) ×6
HEMOVAC 400CC 10FR (MISCELLANEOUS) ×3 IMPLANT
HOLDER FOLEY CATH W/STRAP (MISCELLANEOUS) ×3 IMPLANT
HOOD PEEL AWAY FLYTE STAYCOOL (MISCELLANEOUS) ×6 IMPLANT
KIT RM TURNOVER STRD PROC AR (KITS) ×3 IMPLANT
KNIFE SCULPS 14X20 (INSTRUMENTS) ×3 IMPLANT
LABEL OR SOLS (LABEL) ×3 IMPLANT
NDL SAFETY 18GX1.5 (NEEDLE) ×3 IMPLANT
NDL SPNL 20GX3.5 QUINCKE YW (NEEDLE) ×1 IMPLANT
NEEDLE SPNL 20GX3.5 QUINCKE YW (NEEDLE) ×3 IMPLANT
NS IRRIG 500ML POUR BTL (IV SOLUTION) ×3 IMPLANT
PACK TOTAL KNEE (MISCELLANEOUS) ×3 IMPLANT
PAD WRAPON POLAR KNEE (MISCELLANEOUS) ×1 IMPLANT
PIN DRILL QUICK PACK ×3 IMPLANT
PIN FIXATION 1/8DIA X 3INL (PIN) ×3 IMPLANT
PULSAVAC PLUS IRRIG FAN TIP (DISPOSABLE) ×3
SOL .9 NS 3000ML IRR  AL (IV SOLUTION) ×2
SOL .9 NS 3000ML IRR AL (IV SOLUTION) ×1
SOL .9 NS 3000ML IRR UROMATIC (IV SOLUTION) ×1 IMPLANT
SOL PREP PVP 2OZ (MISCELLANEOUS) ×3
SOLUTION PREP PVP 2OZ (MISCELLANEOUS) ×1 IMPLANT
SPONGE DRAIN TRACH 4X4 STRL 2S (GAUZE/BANDAGES/DRESSINGS) ×3 IMPLANT
STAPLER SKIN PROX 35W (STAPLE) ×3 IMPLANT
STRAP TIBIA SHORT (MISCELLANEOUS) ×2 IMPLANT
SUCTION FRAZIER HANDLE 10FR (MISCELLANEOUS) ×2
SUCTION TUBE FRAZIER 10FR DISP (MISCELLANEOUS) ×1 IMPLANT
SUT VIC AB 0 CT1 36 (SUTURE) ×3 IMPLANT
SUT VIC AB 1 CT1 36 (SUTURE) ×6 IMPLANT
SUT VIC AB 2-0 CT2 27 (SUTURE) ×3 IMPLANT
SYR 20CC LL (SYRINGE) ×3 IMPLANT
SYR 30ML LL (SYRINGE) ×6 IMPLANT
TIP FAN IRRIG PULSAVAC PLUS (DISPOSABLE) ×1 IMPLANT
TOWEL OR 17X26 4PK STRL BLUE (TOWEL DISPOSABLE) ×3 IMPLANT
TOWER CARTRIDGE SMART MIX (DISPOSABLE) ×3 IMPLANT
WRAPON POLAR PAD KNEE (MISCELLANEOUS) ×3

## 2016-10-28 NOTE — Transfer of Care (Signed)
Immediate Anesthesia Transfer of Care Note  Patient: Claudia Terry  Procedure(s) Performed: Procedure(s): COMPUTER ASSISTED TOTAL KNEE ARTHROPLASTY (Left)  Patient Location: PACU  Anesthesia Type:General  Level of Consciousness: sedated  Airway & Oxygen Therapy: Patient Spontanous Breathing and Patient connected to face mask oxygen  Post-op Assessment: Report given to RN and Post -op Vital signs reviewed and stable  Post vital signs: Reviewed  Last Vitals:  Vitals:   10/28/16 1527 10/28/16 1528  BP: 114/75 114/75  Pulse: 75 75  Resp: 10 11  Temp: 36.7 C     Last Pain:  Vitals:   10/28/16 1049  TempSrc: Oral  PainSc: 5          Complications: No apparent anesthesia complications

## 2016-10-28 NOTE — Anesthesia Procedure Notes (Signed)
Procedure Name: Intubation Performed by: Mei Suits Pre-anesthesia Checklist: Patient identified, Patient being monitored, Timeout performed, Emergency Drugs available and Suction available Patient Re-evaluated:Patient Re-evaluated prior to inductionOxygen Delivery Method: Circle system utilized Preoxygenation: Pre-oxygenation with 100% oxygen Intubation Type: IV induction Ventilation: Mask ventilation without difficulty Laryngoscope Size: Miller and 2 Grade View: Grade I Tube type: Oral Tube size: 7.0 mm Number of attempts: 1 Airway Equipment and Method: Stylet Placement Confirmation: ETT inserted through vocal cords under direct vision,  positive ETCO2 and breath sounds checked- equal and bilateral Secured at: 21 cm Tube secured with: Tape Dental Injury: Teeth and Oropharynx as per pre-operative assessment        

## 2016-10-28 NOTE — H&P (Signed)
The patient has been re-examined, and the chart reviewed, and there have been no interval changes to the documented history and physical.    The risks, benefits, and alternatives have been discussed at length. The patient expressed understanding of the risks benefits and agreed with plans for surgical intervention.  James P. Hooten, Jr. M.D.    

## 2016-10-28 NOTE — Anesthesia Post-op Follow-up Note (Cosign Needed)
Anesthesia QCDR form completed.        

## 2016-10-28 NOTE — Plan of Care (Signed)
Problem: Pain Managment: Goal: General experience of comfort will improve Outcome: Not Progressing POD 0

## 2016-10-28 NOTE — Progress Notes (Signed)
PHARMACIST - PHYSICIAN ORDER COMMUNICATION  CONCERNING: P&T Medication Policy on Herbal Medications  DESCRIPTION:  This patient's order for:  Hair, skin, and nails tablets  has been noted.  This product(s) is classified as an "herbal" or natural product. Due to a lack of definitive safety studies or FDA approval, nonstandard manufacturing practices, plus the potential risk of unknown drug-drug interactions while on inpatient medications, the Pharmacy and Therapeutics Committee does not permit the use of "herbal" or natural products of this type within [facility].   ACTION TAKEN: The pharmacy department is unable to verify this order at this time and your patient has been informed of this safety policy. Please reevaluate patient's clinical condition at discharge and address if the herbal or natural product(s) should be resumed at that time.  Lenis Noon, PharmD 10/28/16 6:14 PM

## 2016-10-28 NOTE — Anesthesia Preprocedure Evaluation (Addendum)
Anesthesia Evaluation  Patient identified by MRN, date of birth, ID band Patient awake    Reviewed: Allergy & Precautions, NPO status , Patient's Chart, lab work & pertinent test results  History of Anesthesia Complications (+) history of anesthetic complications (spinals dont work)  Airway Mallampati: II       Dental  (+) Caps, Teeth Intact, Dental Advidsory Given Permanent bridge:   Pulmonary neg pulmonary ROS,           Cardiovascular hypertension, Pt. on home beta blockers and Pt. 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 Tiffany Kocher 11/95: Low back pain No date: Menopause 1999: Restless leg syndrome   Reproductive/Obstetrics                            Anesthesia Physical  Anesthesia Plan  ASA: II  Anesthesia Plan: General   Post-op Pain Management:    Induction: Intravenous  Airway Management Planned: LMA  Additional Equipment:   Intra-op Plan:   Post-operative Plan:   Informed Consent: I have reviewed the patients History and Physical, chart, labs and discussed the procedure including the risks, benefits and alternatives for the proposed anesthesia with the patient or authorized representative who has indicated his/her understanding and acceptance.     Plan Discussed with: Anesthesiologist, Surgeon and CRNA  Anesthesia Plan Comments:          Anesthesia Quick Evaluation

## 2016-10-28 NOTE — Progress Notes (Signed)
Patient refused oral medication, 10/10 pain.

## 2016-10-28 NOTE — Op Note (Signed)
OPERATIVE NOTE  DATE OF SURGERY:  10/28/2016  PATIENT NAME:  Claudia Terry   DOB: [birth year]  MRN: [id number]  PRE-OPERATIVE DIAGNOSIS: Degenerative arthrosis of the left knee, primary  POST-OPERATIVE DIAGNOSIS:  Same  PROCEDURE:  Left total knee arthroplasty using computer-assisted navigation  SURGEON:  Marciano Sequin. M.D.  ASSISTANT:  Vance Peper, PA (present and scrubbed throughout the case, critical for assistance with exposure, retraction, instrumentation, and closure)  ANESTHESIA: general  ESTIMATED BLOOD LOSS: 50 mL  FLUIDS REPLACED: 1800 mL of crystalloid  TOURNIQUET TIME: 103 minutes  DRAINS: 2 medium drains to a reinfusion system  SOFT TISSUE RELEASES: Anterior cruciate ligament, posterior cruciate ligament, deep medial collateral ligament, patellofemoral ligament  IMPLANTS UTILIZED: DePuy Attune size 6 posterior stabilized femoral component (cemented), size 6 rotating platform tibial component (cemented), 38 mm medialized dome patella (cemented), and a 5 mm stabilized rotating platform polyethylene insert.  INDICATIONS FOR SURGERY: Claudia Terry is a 71 y.o. year old female with a long history of progressive knee pain. X-rays demonstrated severe degenerative changes in tricompartmental fashion. The patient had not seen any significant improvement despite conservative nonsurgical intervention. After discussion of the risks and benefits of surgical intervention, the patient expressed understanding of the risks benefits and agree with plans for total knee arthroplasty.   The risks, benefits, and alternatives were discussed at length including but not limited to the risks of infection, bleeding, nerve injury, stiffness, blood clots, the need for revision surgery, cardiopulmonary complications, among others, and they were willing to proceed.  PROCEDURE IN DETAIL: The patient was brought into the operating room and, after adequate general anesthesia was achieved, a  tourniquet was placed on the patient's upper thigh. The patient's knee and leg were cleaned and prepped with alcohol and DuraPrep and draped in the usual sterile fashion. A "timeout" was performed as per usual protocol. The lower extremity was exsanguinated using an Esmarch, and the tourniquet was inflated to 300 mmHg. An anterior longitudinal incision was made followed by a standard mid vastus approach. The deep fibers of the medial collateral ligament were elevated in a subperiosteal fashion off of the medial flare of the tibia so as to maintain a continuous soft tissue sleeve. The patella was subluxed laterally and the patellofemoral ligament was incised. Inspection of the knee demonstrated severe degenerative changes with full-thickness loss of articular cartilage. Osteophytes were debrided using a rongeur. Anterior and posterior cruciate ligaments were excised. Two 4.0 mm Schanz pins were inserted in the femur and into the tibia for attachment of the array of trackers used for computer-assisted navigation. Hip center was identified using a circumduction technique. Distal landmarks were mapped using the computer. The distal femur and proximal tibia were mapped using the computer. The distal femoral cutting guide was positioned using computer-assisted navigation so as to achieve a 5 distal valgus cut. The femur was sized and it was felt that a size 6 femoral component was appropriate. A size 6 femoral cutting guide was positioned and the anterior cut was performed and verified using the computer. This was followed by completion of the posterior and chamfer cuts. Femoral cutting guide for the central box was then positioned in the center box cut was performed.  Attention was then directed to the proximal tibia. Medial and lateral menisci were excised. The extramedullary tibial cutting guide was positioned using computer-assisted navigation so as to achieve a 0 varus-valgus alignment and 3 posterior slope. The  cut was performed and verified using the computer.  The proximal tibia was sized and it was felt that a size 6 tibial tray was appropriate. Tibial and femoral trials were inserted followed by insertion of a 5 mm polyethylene insert. This allowed for excellent mediolateral soft tissue balancing both in flexion and in full extension. Finally, the patella was cut and prepared so as to accommodate a 38 mm medialized dome patella. A patella trial was placed and the knee was placed through a range of motion with excellent patellar tracking appreciated. The femoral trial was removed after debridement of posterior osteophytes. The central post-hole for the tibial component was reamed followed by insertion of a keel punch. Tibial trials were then removed. Cut surfaces of bone were irrigated with copious amounts of normal saline with antibiotic solution using pulsatile lavage and then suctioned dry. Polymethylmethacrylate cement with gentamicin was prepared in the usual fashion using a vacuum mixer. Cement was applied to the cut surface of the proximal tibia as well as along the undersurface of a size 6 rotating platform tibial component. Tibial component was positioned and impacted into place. Excess cement was removed using [employer] fast streamer. Cement was then applied to the cut surfaces of the femur as well as along the posterior flanges of the size 6 femoral component. The femoral component was positioned and impacted into place. Excess cement was removed using [employer] fast streamer. A 5 mm polyethylene trial was inserted and the knee was brought into full extension with steady axial compression applied. Finally, cement was applied to the backside of a 38 mm medialized dome patella and the patellar component was positioned and patellar clamp applied. Excess cement was removed using [employer] fast streamer. After adequate curing of the cement, the tourniquet was deflated after a total tourniquet time of 103 minutes. Hemostasis was achieved  using electrocautery. The knee was irrigated with copious amounts of normal saline with antibiotic solution using pulsatile lavage and then suctioned dry. 20 mL of 1.3% Exparel and 60 mL of 0.25% Marcaine in 40 mL of normal saline was injected along the posterior capsule, medial and lateral gutters, and along the arthrotomy site. A 5 mm stabilized rotating platform polyethylene insert was inserted and the knee was placed through a range of motion with excellent mediolateral soft tissue balancing appreciated and excellent patellar tracking noted. 2 medium drains were placed in the wound bed and brought out through separate stab incisions to be attached to a reinfusion system. The medial parapatellar portion of the incision was reapproximated using interrupted sutures of #1 Vicryl. Subcutaneous tissue was approximated in layers using first #0 Vicryl followed #2-0 Vicryl. The skin was approximated with skin staples. A sterile dressing was applied.  The patient tolerated the procedure well and was transported to the recovery room in stable condition.    Adi Seales P. Holley Bouche., M.D.

## 2016-10-29 ENCOUNTER — Encounter: Payer: Self-pay | Admitting: Orthopedic Surgery

## 2016-10-29 LAB — CBC
HCT: 34.5 % — ABNORMAL LOW (ref 35.0–47.0)
Hemoglobin: 12 g/dL (ref 12.0–16.0)
MCH: 32.6 pg (ref 26.0–34.0)
MCHC: 34.7 g/dL (ref 32.0–36.0)
MCV: 93.9 fL (ref 80.0–100.0)
Platelets: 221 10*3/uL (ref 150–440)
RBC: 3.68 MIL/uL — ABNORMAL LOW (ref 3.80–5.20)
RDW: 12.5 % (ref 11.5–14.5)
WBC: 11.5 10*3/uL — ABNORMAL HIGH (ref 3.6–11.0)

## 2016-10-29 LAB — BASIC METABOLIC PANEL
Anion gap: 7 (ref 5–15)
BUN: 12 mg/dL (ref 6–20)
CO2: 26 mmol/L (ref 22–32)
Calcium: 8.7 mg/dL — ABNORMAL LOW (ref 8.9–10.3)
Chloride: 107 mmol/L (ref 101–111)
Creatinine, Ser: 0.63 mg/dL (ref 0.44–1.00)
GFR calc Af Amer: >60 mL/min (ref >60)
GFR calc non Af Amer: >60 mL/min (ref >60)
Glucose, Bld: 114 mg/dL — ABNORMAL HIGH (ref 65–99)
Potassium: 3.6 mmol/L (ref 3.5–5.1)
Sodium: 140 mmol/L (ref 135–145)

## 2016-10-29 MED ORDER — METAXALONE 800 MG PO TABS
800.0000 mg | ORAL_TABLET | Freq: Three times a day (TID) | ORAL | Status: DC | PRN
  Administered 2016-10-29 – 2016-10-30 (×3): 800 mg via ORAL
  Filled 2016-10-29 (×3): qty 1

## 2016-10-29 NOTE — Progress Notes (Signed)
OT Cancellation Note  Patient Details Name: Claudia Terry MRN: [id number] DOB: 1945-08-09   Cancelled Treatment:    Reason Eval/Treat Not Completed: OT screened, no needs identified, will sign off. Order received, chart reviewed. 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 Pt with good supportive home set up with all AE/DME needs met. Pt endorses 3+ falls in past 12 months due to hip dislocations with no falls since L and R hip surgeries. Pt verbalizes confidence with self care tasks and politely declines additional OT services at this time. Fully anticipate good recovery with HHPT services after hospitalization. No additional OT needs at this time. Will sign off. Please re-consult if additional OT needs arise.   Jeni Salles, MPH, MS, OTR/L ascom [phone] 10/29/16, 2:42 PM

## 2016-10-29 NOTE — Progress Notes (Addendum)
  Subjective: 1 Day Post-Op Procedure(s) (LRB): COMPUTER ASSISTED TOTAL KNEE ARTHROPLASTY (Left) Patient reports pain as moderate.   Patient seen in rounds with Dr. Marry Guan. Patient is well, and has had no acute complaints or problems Plan is to go Home after hospital stay. Negative for chest pain and shortness of breath Fever: no Gastrointestinal: Negative for nausea and vomiting  Objective: Vital signs in last 24 hours: Temp:  [97.7 F (36.5 C)-98.6 F (37 C)] 98.6 F (37 C) (05/22 0232) Pulse Rate:  [73-85] 85 (05/22 0232) Resp:  [10-20] 18 (05/21 1947) BP: (109-157)/(57-95) 115/58 (05/22 0232) SpO2:  [94 %-100 %] 94 % (05/22 0232) Weight:  [83.9 kg (185 lb)] 83.9 kg (185 lb) (05/21 1717)  Intake/Output from previous day:  Intake/Output Summary (Last 24 hours) at 10/29/16 0606 Last data filed at 10/29/16 0400  Gross per 24 hour  Intake          3531.67 ml  Output             1830 ml  Net          1701.67 ml    Intake/Output this shift: Total I/O In: 1431.7 [I.V.:1131.7; IV Piggyback:300] Out: 1430 [Urine:1300; Drains:130]  Labs:  Recent Labs  10/29/16 0351  HGB 12.0    Recent Labs  10/29/16 0351  WBC 11.5*  RBC 3.68*  HCT 34.5*  PLT 221    Recent Labs  10/29/16 0351  NA 140  K 3.6  CL 107  CO2 26  BUN 12  CREATININE 0.63  GLUCOSE 114*  CALCIUM 8.7*   No results for input(s): LABPT, INR in the last 72 hours.   EXAM General - Patient is Alert and Oriented Extremity - Neurovascular intact Sensation intact distally Dorsiflexion/Plantar flexion intact Compartment soft Dressing/Incision - clean, dry, no drainage with Hemovac intact Motor Function - intact, moving foot and toes well on exam. Able to Straight leg raise  Past Medical History:  Diagnosis Date  . Arthritis   . 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 GERD (gastroesophageal reflux disease) 8/01    Dr. Tiffany Kocher  . Low back pain 11/95  .  Menopause   . Restless leg syndrome 1999    Assessment/Plan: 1 Day Post-Op Procedure(s) (LRB): COMPUTER ASSISTED TOTAL KNEE ARTHROPLASTY (Left) Active Problems:   S/P total knee arthroplasty  Estimated body mass index is 30.79 kg/m as calculated from the following:   Height as of this encounter: 5\' 5"  (1.651 m).   Weight as of this encounter: 83.9 kg (185 lb). Advance diet Up with therapy D/C IV fluids Plan for discharge tomorrow home versus Thursday  DVT Prophylaxis - Lovenox, Foot Pumps and TED hose Weight-Bearing as tolerated to left leg  Reche Dixon, PA-C Orthopaedic Surgery 10/29/2016, 6:06 AM

## 2016-10-29 NOTE — NC FL2 (Signed)
[place] LEVEL OF CARE SCREENING TOOL     IDENTIFICATION  Patient Name: Claudia Terry Birthdate: 02/18/1946 Sex: female Admission Date (Current Location): 10/28/2016  [place] and [place] Number:  Engineering geologist and Address:  [facility], [address] [place], [place] [address]      Provider Number: [id number]  Attending Physician Name and Address:  Dereck Leep, MD  Relative Name and Phone Number:       Current Level of Care: Hospital Recommended Level of Care: [place] Prior Approval Number:    Date Approved/Denied:   PASRR Number:  ([id number] )  Discharge Plan: SNF    Current Diagnoses: Patient Active Problem List   Diagnosis Date Noted  . S/P total knee arthroplasty 10/28/2016  . Failed total hip arthroplasty with dislocation ([place]) 12/01/2015  . S/P closed reduction of dislocated total hip prosthesis 08/08/2015  . S/P total hip arthroplasty 06/14/2015  . Benign essential HTN 12/28/2013  . Acid reflux 12/28/2013  . HLD (hyperlipidemia) 12/28/2013  . Restless leg 12/28/2013    Orientation RESPIRATION BLADDER Height & Weight     Self, Time, Situation, Place  Normal Continent Weight: 185 lb (83.9 kg) Height:  5\' 5"  (165.1 cm)  BEHAVIORAL SYMPTOMS/MOOD NEUROLOGICAL BOWEL NUTRITION STATUS   (none)  (none) Continent Diet (Diet: Regular )  AMBULATORY STATUS COMMUNICATION OF NEEDS Skin   Extensive Assist Verbally Surgical wounds (Incision: Right Knee )                       Personal Care Assistance Level of Assistance  Bathing, Feeding, Dressing Bathing Assistance: Limited assistance Feeding assistance: Independent Dressing Assistance: Limited assistance     Functional Limitations Info  Sight, Hearing, Speech Sight Info: Adequate Hearing Info: Adequate Speech Info: Adequate    SPECIAL CARE FACTORS FREQUENCY  PT (By licensed PT), OT (By licensed OT)     PT Frequency:  (5) OT  Frequency:  (5)            Contractures      Additional Factors Info  Code Status, Allergies Code Status Info:  (Full Code. ) Allergies Info:  (Penicillins, Erythromycin, Tetracyclines & Related)           Current Medications (10/29/2016):  This is the current hospital active medication list Current Facility-Administered Medications  Medication Dose Route Frequency Provider Last Rate Last Dose  . 0.9 %  sodium chloride infusion   Intravenous Continuous Hooten, Laurice Record, MD 100 mL/hr at 10/29/16 0600    . acetaminophen (OFIRMEV) IV 1,000 mg  1,000 mg Intravenous Q6H Hooten, Laurice Record, MD   Stopped at 10/29/16 [phone]  . acetaminophen (TYLENOL) tablet 650 mg  650 mg Oral Q6H PRN Hooten, Laurice Record, MD       Or  . acetaminophen (TYLENOL) suppository 650 mg  650 mg Rectal Q6H PRN Hooten, Laurice Record, MD      . alum & mag hydroxide-simeth (MAALOX/MYLANTA) 200-200-20 MG/5ML suspension 30 mL  30 mL Oral Q4H PRN Hooten, Laurice Record, MD      . atenolol (TENORMIN) tablet 50 mg  50 mg Oral Daily Hooten, Laurice Record, MD   50 mg at 10/29/16 0800  . bisacodyl (DULCOLAX) suppository 10 mg  10 mg Rectal Daily PRN Hooten, Laurice Record, MD      . calcium-vitamin D (OSCAL WITH D) 500-200 MG-UNIT per tablet 1 tablet  1 tablet Oral Q breakfast Lenis Noon, [facility]  1 tablet at 10/29/16 0759  . celecoxib (CELEBREX) capsule 200 mg  200 mg Oral Q12H Hooten, Laurice Record, MD   200 mg at 10/29/16 0800  . cholecalciferol (VITAMIN D) tablet 2,000 Units  2,000 Units Oral Daily Hooten, Laurice Record, MD   2,000 Units at 10/29/16 0800  . clindamycin (CLEOCIN) IVPB 600 mg  600 mg Intravenous Q6H Hooten, Laurice Record, MD   Stopped at 10/29/16 [phone]  . diphenhydrAMINE (BENADRYL) 12.5 MG/5ML elixir 12.5-25 mg  12.5-25 mg Oral Q4H PRN Hooten, Laurice Record, MD      . enoxaparin (LOVENOX) injection 30 mg  30 mg Subcutaneous Q12H Hooten, Laurice Record, MD   30 mg at 10/29/16 0759  . ferrous sulfate tablet 325 mg  325 mg Oral BID WC Hooten, Laurice Record, MD   325 mg at 10/29/16  0800  . magnesium hydroxide (MILK OF MAGNESIA) suspension 30 mL  30 mL Oral Daily PRN Hooten, Laurice Record, MD      . menthol-cetylpyridinium (CEPACOL) lozenge 3 mg  1 lozenge Oral PRN Hooten, Laurice Record, MD       Or  . phenol (CHLORASEPTIC) mouth spray 1 spray  1 spray Mouth/Throat PRN Hooten, Laurice Record, MD      . metoCLOPramide (REGLAN) tablet 10 mg  10 mg Oral TID AC & HS Hooten, Laurice Record, MD   10 mg at 10/29/16 0800  . morphine 2 MG/ML injection 2 mg  2 mg Intravenous Q2H PRN Hooten, Laurice Record, MD   2 mg at 10/29/16 0118  . ondansetron (ZOFRAN) tablet 4 mg  4 mg Oral Q6H PRN Hooten, Laurice Record, MD       Or  . ondansetron (ZOFRAN) injection 4 mg  4 mg Intravenous Q6H PRN Hooten, Laurice Record, MD      . oxyCODONE (Oxy IR/ROXICODONE) immediate release tablet 5-10 mg  5-10 mg Oral Q4H PRN Hooten, Laurice Record, MD   10 mg at 10/29/16 0800  . pantoprazole (PROTONIX) EC tablet 40 mg  40 mg Oral BID Hooten, Laurice Record, MD   40 mg at 10/29/16 0800  . pramipexole (MIRAPEX) tablet 0.5 mg  0.5 mg Oral QHS Hooten, Laurice Record, MD   0.5 mg at 10/28/16 2234  . senna-docusate (Senokot-S) tablet 1 tablet  1 tablet Oral BID Hooten, Laurice Record, MD   1 tablet at 10/29/16 0800  . sodium phosphate (FLEET) 7-19 GM/118ML enema 1 enema  1 enema Rectal Once PRN Hooten, Laurice Record, MD      . traMADol Veatrice Bourbon) tablet 50-100 mg  50-100 mg Oral Q4H PRN Hooten, Laurice Record, MD         Discharge Medications: Please see discharge summary for a list of discharge medications.  Relevant Imaging Results:  Relevant Lab Results:   Additional Information  (SSN: [id number])  Sarah Baez, Veronia Beets, LCSW

## 2016-10-29 NOTE — Care Management Note (Signed)
Case Management Note  Patient Details  Name: Claudia Terry MRN: [id number] Date of Birth: 04/18/1946  Subjective/Objective:  POD # 1 Left TKA. Met with patient at bedside to discuss discharge planning. She lives at home with her spouse who is her caregiver. Offered choice of home health agencies. [facility] prefers [facility]. Referral to [facility] made for HH PT. She has a walker and BSC. No DME needs. Pharmacy  : [facility], [address] [place]- [phone]. Called Lovenox 40 mg # 14 no refills.                 Action/Plan: [facility] for HHPT, Lovenox called in, No DME needs  Expected Discharge Date:                  Expected Discharge Plan:  Home w Home Health Services  In-House Referral:     Discharge planning Services  CM Consult  Post Acute Care Choice:  Home Health Choice offered to:  Patient  DME Arranged:    DME Agency:     HH Arranged:  PT HH Agency:  [facility] (now [facility])  Status of Service:  In process, will continue to follow  If discussed at Long Length of Stay Meetings, dates discussed:    Additional Comments:  Lisa M Jacobs, RN 10/29/2016, 2:42 PM  

## 2016-10-29 NOTE — Progress Notes (Signed)
Clinical Social Worker (CSW) received SNF consult. PT is recommending home health. RN case manager aware of above. Please reconsult if future social work needs arise. CSW signing off.   Solimar Maiden, LCSW [phone] 

## 2016-10-29 NOTE — Progress Notes (Signed)
Physical Therapy Treatment Patient Details Name: Claudia Terry MRN: [id number] DOB: April 07, 1946 Today's Date: 10/29/2016    History of Present Illness Pt admitted for L TKR. Pt with history of B THR with 3 dislocations.     PT Comments    Pt is making good progress towards goals with increased ambulation distance this session. Good technique with RW with cues for sequencing and mindfulness to keep neutral rotation on B toes. Good endurance with there-ex and pt remains motivated to participate. Will continue to progress.   Follow Up Recommendations  Home health PT     Equipment Recommendations  None recommended by PT    Recommendations for Other Services       Precautions / Restrictions Precautions Precautions: Fall;Knee Precaution Booklet Issued: No Restrictions Weight Bearing Restrictions: Yes LLE Weight Bearing: Weight bearing as tolerated    Mobility  Bed Mobility Overal bed mobility: Needs Assistance Bed Mobility: Sit to Supine       Sit to supine: Supervision   General bed mobility comments: safe technique with ability to bring L LE up to bed. Cues for positioning  Transfers Overall transfer level: Needs assistance Equipment used: Rolling walker (2 wheeled) Transfers: Sit to/from Stand Sit to Stand: Min guard         General transfer comment: safe technique performed with RW.  Ambulation/Gait Ambulation/Gait assistance: Min guard Ambulation Distance (Feet): 200 Feet Assistive device: Rolling walker (2 wheeled) Gait Pattern/deviations: Step-through pattern     General Gait Details: ambulated with reciprocal gait pattern and safe technique using RW. Upright posture noted. B feet tend to IR towards midline, however can correct with cues.   Stairs            Wheelchair Mobility    Modified Rankin (Stroke Patients Only)       Balance                                            Cognition Arousal/Alertness:  Awake/alert Behavior During Therapy: WFL for tasks assessed/performed Overall Cognitive Status: Within Functional Limits for tasks assessed                                        Exercises Other Exercises Other Exercises: supine ther-ex performed on L LE including ankle pumps, quad sets, glut sets, hip abd/add, SLRs, SAQ, and knee flexion stretches. All ther-ex performed x 10 reps with min assist.    General Comments        Pertinent Vitals/Pain Pain Assessment: 0-10 Pain Score: 5  Pain Location: L knee Pain Descriptors / Indicators: Operative site guarding Pain Intervention(s): Limited activity within patient's tolerance;Premedicated before session;Ice applied    Home Living                      Prior Function            PT Goals (current goals can now be found in the care plan section) Acute Rehab PT Goals Patient Stated Goal: to go home PT Goal Formulation: With patient Time For Goal Achievement: 11/12/16 Potential to Achieve Goals: Good Progress towards PT goals: Progressing toward goals    Frequency    BID      PT Plan Current plan remains appropriate    Co-evaluation  AM-PAC PT "6 Clicks" Daily Activity  Outcome Measure  Difficulty turning over in bed (including adjusting bedclothes, sheets and blankets)?: A Little Difficulty moving from lying on back to sitting on the side of the bed? : A Little Difficulty sitting down on and standing up from a chair with arms (e.g., wheelchair, bedside commode, etc,.)?: A Little Help needed moving to and from a bed to chair (including a wheelchair)?: A Little Help needed walking in hospital room?: A Little Help needed climbing 3-5 steps with a railing? : A Lot 6 Click Score: 17    End of Session Equipment Utilized During Treatment: Gait belt Activity Tolerance: Patient tolerated treatment well Patient left: in bed;with bed alarm set;with SCD's reapplied Nurse  Communication: Mobility status PT Visit Diagnosis: Muscle weakness (generalized) (M62.81);History of falling (Z91.81);Difficulty in walking, not elsewhere classified (R26.2);Pain;Other abnormalities of gait and mobility (R26.89) Pain - Right/Left: Left Pain - part of body: Knee     Time: 7703-4035 PT Time Calculation (min) (ACUTE ONLY): 30 min  Charges:  $Gait Training: 8-22 mins $Therapeutic Exercise: 8-22 mins                    G Codes:       Greggory Stallion, PT, DPT [phone]    Abby Stines 10/29/2016, 4:00 PM

## 2016-10-29 NOTE — Evaluation (Signed)
Physical Therapy Evaluation Patient Details Name: Claudia Terry MRN: [id number] DOB: [birth year] Today's Date: 10/29/2016   History of Present Illness  Pt admitted for L TKR. Pt with history of B THR with 3 dislocations.   Clinical Impression  Pt is a pleasant 71 year old female who was admitted for L TKR. Pt performs bed mobility, transfers, and ambulation with cga and RW. Pt demonstrates deficits with strength/mobility/ROM. Pt able to perform 10 SLRs without assistance, therefore does not require KI at this time. Pt is motivated to participate in therapy. Would benefit from skilled PT to address above deficits and promote optimal return to PLOF. Recommend transition to [place] upon discharge from acute hospitalization.       Follow Up Recommendations Home health PT    Equipment Recommendations  None recommended by PT    Recommendations for Other Services       Precautions / Restrictions Precautions Precautions: Fall;Knee Precaution Booklet Issued: No Restrictions Weight Bearing Restrictions: Yes LLE Weight Bearing: Weight bearing as tolerated      Mobility  Bed Mobility Overal bed mobility: Needs Assistance Bed Mobility: Supine to Sit     Supine to sit: Min guard     General bed mobility comments: safe technique with guidance for lowering L Leg to floor. Cues for sequencing given.  Transfers Overall transfer level: Needs assistance Equipment used: Rolling walker (2 wheeled) Transfers: Sit to/from Stand Sit to Stand: Min guard         General transfer comment: safe technique performed with RW.  Ambulation/Gait Ambulation/Gait assistance: Min guard Ambulation Distance (Feet): 40 Feet Assistive device: Rolling walker (2 wheeled) Gait Pattern/deviations: Step-to pattern     General Gait Details: ambulated using small steps with RW. Cues for sequencing given. Upright posture noted. Pt fatigues quickly, requesting to return back to chair  Stairs             Wheelchair Mobility    Modified Rankin (Stroke Patients Only)       Balance Overall balance assessment: Needs assistance Sitting-balance support: No upper extremity supported;Feet supported Sitting balance-Leahy Scale: Normal     Standing balance support: Bilateral upper extremity supported Standing balance-Leahy Scale: Good                               Pertinent Vitals/Pain Pain Assessment: 0-10 Pain Score: 5  Pain Location: L knee Pain Descriptors / Indicators: Operative site guarding Pain Intervention(s): Limited activity within patient's tolerance;Ice applied    Home Living Family/patient expects to be discharged to:: Private residence Living Arrangements: Spouse/significant other Available Help at Discharge: Family Type of Home: House Home Access: Stairs to enter Entrance Stairs-Rails: Can reach both Entrance Stairs-Number of Steps: 4 Home Layout: One level Home Equipment: [place] - 4 wheels;Walker - 2 wheels;Cane - single point;Bedside commode;Grab bars - tub/shower;Shower seat      Prior Function Level of Independence: Independent with assistive device(s)         Comments: retired Therapist, sports;     Journalist, newspaper   Dominant Hand: Right    Extremity/Trunk Assessment   Upper Extremity Assessment Upper Extremity Assessment: Overall WFL for tasks assessed    Lower Extremity Assessment Lower Extremity Assessment: Generalized weakness (L knee grossly 3/5; R LE grossly 5/5)       Communication   Communication: No difficulties  Cognition Arousal/Alertness: Awake/alert Behavior During Therapy: WFL for tasks assessed/performed Overall Cognitive Status: Within Functional Limits for tasks  assessed                                        General Comments      Exercises Total Joint Exercises Goniometric ROM: L knee AAROM: 4-74 degrees Other Exercises Other Exercises: supine ther-ex performed on L LE including ankle pumps, quad  sets, glut sets, hip abd/add, SLRs, and knee flexion stretches. All ther-ex performed x 10 reps with min assist.   Assessment/Plan    PT Assessment Patient needs continued PT services  PT Problem List Decreased strength;Decreased activity tolerance;Decreased balance;Decreased mobility;Pain       PT Treatment Interventions DME instruction;Gait training;Therapeutic exercise    PT Goals (Current goals can be found in the Care Plan section)  Acute Rehab PT Goals Patient Stated Goal: to go home PT Goal Formulation: With patient Time For Goal Achievement: 11/12/16 Potential to Achieve Goals: Good    Frequency BID   Barriers to discharge        Co-evaluation               AM-PAC PT "6 Clicks" Daily Activity  Outcome Measure Difficulty turning over in bed (including adjusting bedclothes, sheets and blankets)?: Total Difficulty moving from lying on back to sitting on the side of the bed? : Total Difficulty sitting down on and standing up from a chair with arms (e.g., wheelchair, bedside commode, etc,.)?: Total Help needed moving to and from a bed to chair (including a wheelchair)?: A Little Help needed walking in hospital room?: A Little Help needed climbing 3-5 steps with a railing? : A Lot 6 Click Score: 11    End of Session Equipment Utilized During Treatment: Gait belt Activity Tolerance: Patient tolerated treatment well Patient left: in chair;with chair alarm set Nurse Communication: Mobility status PT Visit Diagnosis: Muscle weakness (generalized) (M62.81);History of falling (Z91.81);Difficulty in walking, not elsewhere classified (R26.2);Pain;Other abnormalities of gait and mobility (R26.89) Pain - Right/Left: Left Pain - part of body: Knee    Time: 3710-6269 PT Time Calculation (min) (ACUTE ONLY): 37 min   Charges:   PT Evaluation $PT Eval Low Complexity: 1 Procedure PT Treatments $Therapeutic Exercise: 8-22 mins   PT G Codes:        Greggory Stallion, PT,  DPT [phone]   Alanah Sakuma 10/29/2016, 11:21 AM

## 2016-10-30 LAB — BASIC METABOLIC PANEL
Anion gap: 5 (ref 5–15)
BUN: 9 mg/dL (ref 6–20)
CO2: 28 mmol/L (ref 22–32)
Calcium: 8.6 mg/dL — ABNORMAL LOW (ref 8.9–10.3)
Chloride: 107 mmol/L (ref 101–111)
Creatinine, Ser: 0.55 mg/dL (ref 0.44–1.00)
GFR calc Af Amer: >60 mL/min (ref >60)
GFR calc non Af Amer: >60 mL/min (ref >60)
Glucose, Bld: 113 mg/dL — ABNORMAL HIGH (ref 65–99)
Potassium: 3.1 mmol/L — ABNORMAL LOW (ref 3.5–5.1)
Sodium: 140 mmol/L (ref 135–145)

## 2016-10-30 LAB — CBC
HCT: 33 % — ABNORMAL LOW (ref 35.0–47.0)
Hemoglobin: 11.6 g/dL — ABNORMAL LOW (ref 12.0–16.0)
MCH: 33.4 pg (ref 26.0–34.0)
MCHC: 35.3 g/dL (ref 32.0–36.0)
MCV: 94.5 fL (ref 80.0–100.0)
Platelets: 192 10*3/uL (ref 150–440)
RBC: 3.49 MIL/uL — ABNORMAL LOW (ref 3.80–5.20)
RDW: 12.9 % (ref 11.5–14.5)
WBC: 10.2 10*3/uL (ref 3.6–11.0)

## 2016-10-30 MED ORDER — ENOXAPARIN SODIUM 40 MG/0.4ML ~~LOC~~ SOLN: 40.0000 mg | SUBCUTANEOUS | 0 refills | Status: DC

## 2016-10-30 MED ORDER — CELECOXIB 200 MG PO CAPS: 200.0000 mg | ORAL_CAPSULE | Freq: Two times a day (BID) | ORAL | 1 refills | Status: DC

## 2016-10-30 MED ORDER — POTASSIUM CHLORIDE CRYS ER 20 MEQ PO TBCR
20.0000 meq | EXTENDED_RELEASE_TABLET | Freq: Two times a day (BID) | ORAL | Status: DC
  Administered 2016-10-30: 20 meq via ORAL
  Filled 2016-10-30: qty 1

## 2016-10-30 MED ORDER — TRAMADOL HCL 50 MG PO TABS: 50.0000 mg | ORAL_TABLET | ORAL | 1 refills | Status: DC | PRN

## 2016-10-30 MED ORDER — OXYCODONE HCL 5 MG PO TABS: 5.0000 mg | ORAL_TABLET | ORAL | 0 refills | Status: DC | PRN

## 2016-10-30 MED ORDER — POTASSIUM CHLORIDE CRYS ER 20 MEQ PO TBCR: 20.0000 meq | EXTENDED_RELEASE_TABLET | Freq: Two times a day (BID) | ORAL | 0 refills | Status: DC

## 2016-10-30 NOTE — Progress Notes (Signed)
  Subjective: 2 Days Post-Op Procedure(s) (LRB): COMPUTER ASSISTED TOTAL KNEE ARTHROPLASTY (Left) Patient reports pain as mild.  She was having some cramps last night, but is improved. Patient seen in rounds with Dr. Marry Guan. Patient is well, and has had no acute complaints or problems Plan is to go Home after hospital stay. Plan for today. Negative for chest pain and shortness of breath Fever: no Gastrointestinal: Negative for nausea and vomiting  Objective: Vital signs in last 24 hours: Temp:  [98.2 F (36.8 C)-98.7 F (37.1 C)] 98.4 F (36.9 C) (05/22 2325) Pulse Rate:  [79-85] 80 (05/22 2325) Resp:  [16-18] 18 (05/22 2325) BP: (96-148)/(50-71) 110/55 (05/22 2325) SpO2:  [94 %-98 %] 98 % (05/22 2325)  Intake/Output from previous day:  Intake/Output Summary (Last 24 hours) at 10/30/16 0606 Last data filed at 10/30/16 0500  Gross per 24 hour  Intake             2290 ml  Output             1250 ml  Net             1040 ml    Intake/Output this shift: Total I/O In: 480 [P.O.:480] Out: 50 [Drains:50]  Labs:  Recent Labs  10/29/16 0351 10/30/16 0346  HGB 12.0 11.6*    Recent Labs  10/29/16 0351 10/30/16 0346  WBC 11.5* 10.2  RBC 3.68* 3.49*  HCT 34.5* 33.0*  PLT 221 192    Recent Labs  10/29/16 0351 10/30/16 0346  NA 140 140  K 3.6 3.1*  CL 107 107  CO2 26 28  BUN 12 9  CREATININE 0.63 0.55  GLUCOSE 114* 113*  CALCIUM 8.7* 8.6*   No results for input(s): LABPT, INR in the last 72 hours.   EXAM General - Patient is Alert and Oriented Extremity - Neurovascular intact Sensation intact distally Dorsiflexion/Plantar flexion intact Compartment soft Dressing/Incision - clean, dry, no drainage with Hemovac Removed. Surgical bandage removed. Motor Function - intact, moving foot and toes well on exam. Able to Straight leg raise. The patient was able to ambulate 200 feet with physical therapy.  Past Medical History:  Diagnosis Date  . Arthritis   .  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 GERD (gastroesophageal reflux disease) 8/01    Dr. Tiffany Kocher  . Low back pain 11/95  . Menopause   . Restless leg syndrome 1999    Assessment/Plan: 2 Days Post-Op Procedure(s) (LRB): COMPUTER ASSISTED TOTAL KNEE ARTHROPLASTY (Left) Active Problems:   S/P total knee arthroplasty  Estimated body mass index is 30.79 kg/m as calculated from the following:   Height as of this encounter: 5\' 5"  (1.651 m).   Weight as of this encounter: 83.9 kg (185 lb).  Bowel management. Hypokalemia with K-Dur added. Plan to discharge home today  DVT Prophylaxis - Lovenox, Foot Pumps and TED hose Weight-Bearing as tolerated to left leg  Reche Dixon, PA-C Orthopaedic Surgery 10/30/2016, 6:06 AM

## 2016-10-30 NOTE — Discharge Summary (Signed)
Physician Discharge Summary  Subjective: 2 Days Post-Op Procedure(s) (LRB): COMPUTER ASSISTED TOTAL KNEE ARTHROPLASTY (Left) Patient reports pain as mild.   Patient seen in rounds with Dr. Marry Guan. Patient is well, and has had no acute complaints or problems Patient is ready to go home with home health physical therapy  Physician Discharge Summary  Patient ID: Claudia Terry MRN: [id number] DOB/AGE: Feb 25, 1946 71 y.o.  Admit date: 10/28/2016 Discharge date: 10/30/2016  Admission Diagnoses:  Discharge Diagnoses:  Active Problems:   S/P total knee arthroplasty   Discharged Condition: good  Hospital Course: The patient is postop day 2 from a left total knee replacement. The patient has done well since surgery. She has better pain control on day 2. The patient ambulated 200 feet and is able to do a straight leg raise. The patient is ambulating with a walker safely. The patient does need to have a bowel movement before discharge. The patient had a potassium level of 3.1 this morning and potassium chloride was added. She is ready to go home with home health physical therapy.  Treatments: surgery:  Left total knee arthroplasty using computer-assisted navigation  SURGEON:  Marciano Sequin. M.D.  ASSISTANT:  Vance Peper, PA (present and scrubbed throughout the case, critical for assistance with exposure, retraction, instrumentation, and closure)  ANESTHESIA: general  ESTIMATED BLOOD LOSS: 50 mL  FLUIDS REPLACED: 1800 mL of crystalloid  TOURNIQUET TIME: 103 minutes  DRAINS: 2 medium drains to a reinfusion system  SOFT TISSUE RELEASES: Anterior cruciate ligament, posterior cruciate ligament, deep medial collateral ligament, patellofemoral ligament  IMPLANTS UTILIZED: DePuy Attune size 6 posterior stabilized femoral component (cemented), size 6 rotating platform tibial component (cemented), 38 mm medialized dome patella (cemented), and a 5 mm stabilized rotating platform  polyethylene insert.  Discharge Exam: Blood pressure (!) 110/55, pulse 80, temperature 98.4 F (36.9 C), temperature source Oral, resp. rate 18, height 5\' 5"  (1.651 m), weight 83.9 kg (185 lb), last menstrual period 02/08/2005, SpO2 98 %.   Disposition: 06-Home-Health Care Svc   Allergies as of 10/30/2016      Reactions   Penicillins Hives, Other (See Comments)   Has patient had a PCN reaction causing immediate rash, facial/tongue/throat swelling, SOB or lightheadedness with hypotension: No Has patient had a PCN reaction causing severe rash involving mucus membranes or skin necrosis: No Has patient had a PCN reaction that required hospitalization No Has patient had a PCN reaction occurring within the last 10 years: No If all of the above answers are "NO", then may proceed with Cephalosporin use.   Erythromycin Rash   Tetracyclines & Related Rash      Medication List    STOP taking these medications   naproxen sodium 220 MG tablet Commonly known as:  ANAPROX     TAKE these medications   acetaminophen 500 MG tablet Commonly known as:  TYLENOL Take 1,000 mg by mouth 2 (two) times daily as needed (for knee pain.).   atenolol 50 MG tablet Commonly known as:  TENORMIN Take 50 mg by mouth daily.   celecoxib 200 MG capsule Commonly known as:  CELEBREX Take 1 capsule (200 mg total) by mouth every 12 (twelve) hours.   CINNAMON PO Take 1,000 mg by mouth daily.   CITRACAL +D3 PO Take 1 tablet by mouth daily.   enoxaparin 40 MG/0.4ML injection Commonly known as:  LOVENOX Inject 0.4 mLs (40 mg total) into the skin daily.   HAIR SKIN NAILS PO Take 1 tablet by mouth  2 (two) times daily.   Melatonin 10 MG Tabs Take 10 mg by mouth at bedtime.   MIRAPEX 0.5 MG tablet Generic drug:  pramipexole Take 0.5 mg by mouth at bedtime.   omeprazole 20 MG capsule Commonly known as:  PRILOSEC Take 20 mg by mouth 2 (two) times daily before a meal.   oxyCODONE 5 MG immediate release  tablet Commonly known as:  Oxy IR/ROXICODONE Take 1-2 tablets (5-10 mg total) by mouth every 4 (four) hours as needed for severe pain or breakthrough pain.   potassium chloride SA 20 MEQ tablet Commonly known as:  K-DUR,KLOR-CON Take 1 tablet (20 mEq total) by mouth 2 (two) times daily.   Red Yeast Rice 600 MG Tabs Take 600 mg by mouth 2 (two) times daily.   traMADol 50 MG tablet Commonly known as:  ULTRAM Take 1-2 tablets (50-100 mg total) by mouth every 4 (four) hours as needed for moderate pain.   TURMERIC PO Take 1 capsule by mouth daily.   Vitamin D3 2000 units capsule Take 2,000 Units by mouth daily.            Durable Medical Equipment        Start     Ordered   10/28/16 1724  DME Walker rolling  Once    Question:  Patient needs a walker to treat with the following condition  Answer:  Total knee replacement status   10/28/16 1723   10/28/16 1724  DME Bedside commode  Once    Question:  Patient needs a bedside commode to treat with the following condition  Answer:  Total knee replacement status   10/28/16 1723     Follow-up Information    Watt Climes, PA Follow up on 11/12/2016.   Specialty:  Physician Assistant Why:  at 1:30pm Contact information: [place] [address] [phone]        Dereck Leep, MD Follow up on 12/10/2016.   Specialty:  Orthopedic Surgery Why:  at 11:15am Contact information: [place] [address] [phone]           Signed: Prescott Parma, Dyron Kawano 10/30/2016, 6:13 AM   Objective: Vital signs in last 24 hours: Temp:  [98.2 F (36.8 C)-98.7 F (37.1 C)] 98.4 F (36.9 C) (05/22 2325) Pulse Rate:  [79-81] 80 (05/22 2325) Resp:  [18] 18 (05/22 2325) BP: (96-148)/(50-71) 110/55 (05/22 2325) SpO2:  [94 %-98 %] 98 % (05/22 2325)  Intake/Output from previous day:  Intake/Output Summary (Last 24 hours) at 10/30/16 0613 Last data  filed at 10/30/16 0500  Gross per 24 hour  Intake             2290 ml  Output             1250 ml  Net             1040 ml    Intake/Output this shift: Total I/O In: 480 [P.O.:480] Out: 50 [Drains:50]  Labs:  Recent Labs  10/29/16 0351 10/30/16 0346  HGB 12.0 11.6*    Recent Labs  10/29/16 0351 10/30/16 0346  WBC 11.5* 10.2  RBC 3.68* 3.49*  HCT 34.5* 33.0*  PLT 221 192    Recent Labs  10/29/16 0351 10/30/16 0346  NA 140 140  K 3.6 3.1*  CL 107 107  CO2 26 28  BUN 12 9  CREATININE 0.63 0.55  GLUCOSE 114* 113*  CALCIUM 8.7* 8.6*  No results for input(s): LABPT, INR in the last 72 hours.  EXAM: General - Patient is Alert and Oriented Extremity - Neurovascular intact Dorsiflexion/Plantar flexion intact No cellulitis present Compartment soft Incision - clean, dry, no drainage Motor Function -  the patient can plantarflex and dorsiflex her feet. She can do a straight leg raise.  Assessment/Plan: 2 Days Post-Op Procedure(s) (LRB): COMPUTER ASSISTED TOTAL KNEE ARTHROPLASTY (Left) Procedure(s) (LRB): COMPUTER ASSISTED TOTAL KNEE ARTHROPLASTY (Left) Past Medical History:  Diagnosis Date  . Arthritis   . 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 GERD (gastroesophageal reflux disease) 8/01    Dr. Tiffany Kocher  . Low back pain 11/95  . Menopause   . Restless leg syndrome 1999   Active Problems:   S/P total knee arthroplasty  Estimated body mass index is 30.79 kg/m as calculated from the following:   Height as of this encounter: 5\' 5"  (1.651 m).   Weight as of this encounter: 83.9 kg (185 lb). Discharge home with home health Diet - Regular diet Follow up - in 2 weeks Activity - WBAT Disposition - Home Condition Upon Discharge - Good DVT Prophylaxis - Lovenox and TED hose  Reche Dixon, PA-C Orthopaedic Surgery 10/30/2016, 6:13 AM

## 2016-10-30 NOTE — Progress Notes (Signed)
Physical Therapy Treatment Patient Details Name: Claudia Terry MRN: [id number] DOB: [birth year] Today's Date: 10/30/2016    History of Present Illness Pt admitted for L TKR. Pt with history of B THR with 3 dislocations.     PT Comments    Pt is making good progress and is ready for discharge. Pt has completed stair training and was educated on written HEP. Pt demonstrates safe technique with all mobility. Still requires cues for keeping toes pointed forward as she tends to toe in.   Follow Up Recommendations  Home health PT     Equipment Recommendations  None recommended by PT    Recommendations for Other Services       Precautions / Restrictions Precautions Precautions: Fall;Knee Precaution Booklet Issued: Yes (comment) Restrictions Weight Bearing Restrictions: Yes LLE Weight Bearing: Weight bearing as tolerated    Mobility  Bed Mobility Overal bed mobility: Needs Assistance Bed Mobility: Supine to Sit     Supine to sit: Min guard     General bed mobility comments: safe technique with no cues for sequencing  Transfers Overall transfer level: Needs assistance Equipment used: Rolling walker (2 wheeled) Transfers: Sit to/from Stand Sit to Stand: Min guard         General transfer comment: safe technique performed with RW.  Ambulation/Gait Ambulation/Gait assistance: Min guard Ambulation Distance (Feet): 100 Feet Assistive device: Rolling walker (2 wheeled) Gait Pattern/deviations: Step-through pattern     General Gait Details: ambulated using reciprocal gait pattern and RW. Safe technique, however needs cues to keep toes pointed straight.   Stairs Stairs: Yes   Stair Management: Two rails Number of Stairs: 4 General stair comments: Pt safely navigated up/down 4 stairs with B rails and safe technique with step to gait pattern. Pt demonstrates good endurance  Wheelchair Mobility    Modified Rankin (Stroke Patients Only)       Balance                                             Cognition Arousal/Alertness: Awake/alert Behavior During Therapy: WFL for tasks assessed/performed Overall Cognitive Status: Within Functional Limits for tasks assessed                                        Exercises Total Joint Exercises Goniometric ROM: L knee AAROM: 0-91 degrees Other Exercises Other Exercises: supine ther-ex performed on L LE including ankle pumps, quad sets, glut sets, hip abd/add, SLRs, SAQ, LAQ and knee flexion stretches. All ther-ex performed x 12 reps with min assist. Reviewed written HEP.    General Comments        Pertinent Vitals/Pain Pain Assessment: 0-10 Pain Score: 7  Pain Location: L knee Pain Descriptors / Indicators: Operative site guarding Pain Intervention(s): Limited activity within patient's tolerance    Home Living                      Prior Function            PT Goals (current goals can now be found in the care plan section) Acute Rehab PT Goals Patient Stated Goal: to go home PT Goal Formulation: With patient Time For Goal Achievement: 11/12/16 Potential to Achieve Goals: Good Progress towards PT goals: Progressing toward goals  Frequency    BID      PT Plan Current plan remains appropriate    Co-evaluation              AM-PAC PT "6 Clicks" Daily Activity  Outcome Measure  Difficulty turning over in bed (including adjusting bedclothes, sheets and blankets)?: None Difficulty moving from lying on back to sitting on the side of the bed? : None Difficulty sitting down on and standing up from a chair with arms (e.g., wheelchair, bedside commode, etc,.)?: A Little Help needed moving to and from a bed to chair (including a wheelchair)?: A Little Help needed walking in hospital room?: A Little Help needed climbing 3-5 steps with a railing? : A Little 6 Click Score: 20    End of Session Equipment Utilized During Treatment: Gait  belt Activity Tolerance: Patient tolerated treatment well Patient left: in chair;with chair alarm set Nurse Communication: Mobility status PT Visit Diagnosis: Muscle weakness (generalized) (M62.81);History of falling (Z91.81);Difficulty in walking, not elsewhere classified (R26.2);Pain;Other abnormalities of gait and mobility (R26.89) Pain - Right/Left: Left Pain - part of body: Knee     Time: 5436-0677 PT Time Calculation (min) (ACUTE ONLY): 27 min  Charges:  $Gait Training: 8-22 mins $Therapeutic Exercise: 8-22 mins                    G Codes:       Greggory Stallion, PT, DPT [phone]    Claudia Terry 10/30/2016, 10:07 AM

## 2016-10-30 NOTE — Care Management Note (Signed)
Case Management Note  Patient Details  Name: Claudia Terry MRN: [id number] Date of Birth: [birth year]  Subjective/Objective: Discharging today                   Action/Plan: [facility] notified of discharge. [place] Lovenox is $  81.89  Expected Discharge Date:  10/30/16               Expected Discharge Plan:  [place]  In-House Referral:     Discharge planning Services  CM Consult  Post Acute Care Choice:  Home Health Choice offered to:  Patient  DME Arranged:    DME Agency:     HH Arranged:  PT [facility]:  [facility] (now [facility])  Status of Service:  Completed, signed off  If discussed at [employer] of Stay Meetings, dates discussed:    Additional Comments:  Jolly Mango, RN 10/30/2016, 9:09 AM

## 2016-10-30 NOTE — Discharge Instructions (Signed)

## 2016-10-30 NOTE — Progress Notes (Signed)
Discharge Instructions reviewed with patient and Prescriptions for tramadol and oxycodone given to patient. Patient verbalized understanding. Patient reminded not to drink alcohol, drive a care or operate heavy machinery while taking these medications. Patient had a bowel movement prior to discharge per order. Patient's honeycomb dressing changed to Left Knee per order. IV dc'ed with cath intact, dressing applied. Patient awaiting the arrival of spouse who will be transporting patient home.

## 2016-10-30 NOTE — Progress Notes (Signed)
Patients pain was more controlled with current medications. Was able to tolerate bone foam for a couple hours but said pain was too severe.  Up to BR with 1 assist.  No signs of distress.  Milk of Mag given before sleep.

## 2016-10-31 DIAGNOSIS — G2581 Restless legs syndrome: Secondary | ICD-10-CM | POA: Diagnosis not present

## 2016-10-31 DIAGNOSIS — Z471 Aftercare following joint replacement surgery: Secondary | ICD-10-CM | POA: Diagnosis not present

## 2016-10-31 DIAGNOSIS — M545 Low back pain: Secondary | ICD-10-CM | POA: Diagnosis not present

## 2016-10-31 DIAGNOSIS — Z9181 History of falling: Secondary | ICD-10-CM | POA: Diagnosis not present

## 2016-10-31 DIAGNOSIS — Z96652 Presence of left artificial knee joint: Secondary | ICD-10-CM | POA: Diagnosis not present

## 2016-10-31 NOTE — Anesthesia Postprocedure Evaluation (Signed)
Anesthesia Post Note  Patient: Claudia Terry  Procedure(s) Performed: Procedure(s) (LRB): COMPUTER ASSISTED TOTAL KNEE ARTHROPLASTY (Left)  Patient location during evaluation: PACU Anesthesia Type: General Level of consciousness: awake and alert Pain management: pain level controlled Vital Signs Assessment: post-procedure vital signs reviewed and stable Respiratory status: spontaneous breathing, nonlabored ventilation, respiratory function stable and patient connected to nasal cannula oxygen Cardiovascular status: blood pressure returned to baseline and stable Postop Assessment: no signs of nausea or vomiting Anesthetic complications: no     Last Vitals:  Vitals:   10/29/16 2325 10/30/16 0928  BP: (!) 110/55 124/74  Pulse: 80 97  Resp: 18 16  Temp: 36.9 C 36.7 C    Last Pain:  Vitals:   10/30/16 1129  TempSrc:   PainSc: 5                  Martha Clan

## 2016-11-05 DIAGNOSIS — G2581 Restless legs syndrome: Secondary | ICD-10-CM | POA: Diagnosis not present

## 2016-11-05 DIAGNOSIS — Z96652 Presence of left artificial knee joint: Secondary | ICD-10-CM | POA: Diagnosis not present

## 2016-11-05 DIAGNOSIS — Z471 Aftercare following joint replacement surgery: Secondary | ICD-10-CM | POA: Diagnosis not present

## 2016-11-05 DIAGNOSIS — M545 Low back pain: Secondary | ICD-10-CM | POA: Diagnosis not present

## 2016-11-05 DIAGNOSIS — Z9181 History of falling: Secondary | ICD-10-CM | POA: Diagnosis not present

## 2016-11-11 DIAGNOSIS — Z9181 History of falling: Secondary | ICD-10-CM | POA: Diagnosis not present

## 2016-11-11 DIAGNOSIS — Z96652 Presence of left artificial knee joint: Secondary | ICD-10-CM | POA: Diagnosis not present

## 2016-11-11 DIAGNOSIS — G2581 Restless legs syndrome: Secondary | ICD-10-CM | POA: Diagnosis not present

## 2016-11-11 DIAGNOSIS — M545 Low back pain: Secondary | ICD-10-CM | POA: Diagnosis not present

## 2016-11-11 DIAGNOSIS — Z471 Aftercare following joint replacement surgery: Secondary | ICD-10-CM | POA: Diagnosis not present

## 2016-11-12 DIAGNOSIS — M6281 Muscle weakness (generalized): Secondary | ICD-10-CM | POA: Diagnosis not present

## 2016-11-12 DIAGNOSIS — M25662 Stiffness of left knee, not elsewhere classified: Secondary | ICD-10-CM | POA: Diagnosis not present

## 2016-11-12 DIAGNOSIS — M25562 Pain in left knee: Secondary | ICD-10-CM | POA: Diagnosis not present

## 2016-11-12 DIAGNOSIS — R262 Difficulty in walking, not elsewhere classified: Secondary | ICD-10-CM | POA: Diagnosis not present

## 2016-11-13 DIAGNOSIS — M25562 Pain in left knee: Secondary | ICD-10-CM | POA: Diagnosis not present

## 2016-11-19 DIAGNOSIS — M25562 Pain in left knee: Secondary | ICD-10-CM | POA: Diagnosis not present

## 2016-11-21 DIAGNOSIS — M25562 Pain in left knee: Secondary | ICD-10-CM | POA: Diagnosis not present

## 2016-11-26 DIAGNOSIS — M25562 Pain in left knee: Secondary | ICD-10-CM | POA: Diagnosis not present

## 2016-11-28 DIAGNOSIS — M25562 Pain in left knee: Secondary | ICD-10-CM | POA: Diagnosis not present

## 2016-12-03 DIAGNOSIS — M25562 Pain in left knee: Secondary | ICD-10-CM | POA: Diagnosis not present

## 2016-12-05 DIAGNOSIS — M25562 Pain in left knee: Secondary | ICD-10-CM | POA: Diagnosis not present

## 2016-12-10 DIAGNOSIS — Z96652 Presence of left artificial knee joint: Secondary | ICD-10-CM | POA: Diagnosis not present

## 2017-02-03 DIAGNOSIS — Z471 Aftercare following joint replacement surgery: Secondary | ICD-10-CM | POA: Diagnosis not present

## 2017-02-06 DIAGNOSIS — E7800 Pure hypercholesterolemia, unspecified: Secondary | ICD-10-CM | POA: Diagnosis not present

## 2017-02-06 DIAGNOSIS — R739 Hyperglycemia, unspecified: Secondary | ICD-10-CM | POA: Diagnosis not present

## 2017-02-06 DIAGNOSIS — Z79899 Other long term (current) drug therapy: Secondary | ICD-10-CM | POA: Diagnosis not present

## 2017-02-18 DIAGNOSIS — H524 Presbyopia: Secondary | ICD-10-CM | POA: Diagnosis not present

## 2017-02-18 DIAGNOSIS — H40013 Open angle with borderline findings, low risk, bilateral: Secondary | ICD-10-CM | POA: Diagnosis not present

## 2017-02-18 DIAGNOSIS — H2513 Age-related nuclear cataract, bilateral: Secondary | ICD-10-CM | POA: Diagnosis not present

## 2017-02-18 DIAGNOSIS — H5203 Hypermetropia, bilateral: Secondary | ICD-10-CM | POA: Diagnosis not present

## 2017-02-18 DIAGNOSIS — H52223 Regular astigmatism, bilateral: Secondary | ICD-10-CM | POA: Diagnosis not present

## 2017-03-10 DIAGNOSIS — Z0000 Encounter for general adult medical examination without abnormal findings: Secondary | ICD-10-CM | POA: Diagnosis not present

## 2017-03-10 DIAGNOSIS — R739 Hyperglycemia, unspecified: Secondary | ICD-10-CM | POA: Diagnosis not present

## 2017-03-10 DIAGNOSIS — Z79899 Other long term (current) drug therapy: Secondary | ICD-10-CM | POA: Diagnosis not present

## 2017-03-10 DIAGNOSIS — E7800 Pure hypercholesterolemia, unspecified: Secondary | ICD-10-CM | POA: Diagnosis not present

## 2017-03-25 DIAGNOSIS — Z1231 Encounter for screening mammogram for malignant neoplasm of breast: Secondary | ICD-10-CM | POA: Diagnosis not present

## 2017-04-15 ENCOUNTER — Ambulatory Visit: Payer: PPO | Admitting: Nurse Practitioner

## 2017-06-17 DIAGNOSIS — Z96652 Presence of left artificial knee joint: Secondary | ICD-10-CM | POA: Diagnosis not present

## 2017-06-17 DIAGNOSIS — Z96643 Presence of artificial hip joint, bilateral: Secondary | ICD-10-CM | POA: Diagnosis not present

## 2017-06-17 DIAGNOSIS — M1711 Unilateral primary osteoarthritis, right knee: Secondary | ICD-10-CM | POA: Diagnosis not present

## 2017-06-27 ENCOUNTER — Other Ambulatory Visit: Payer: Self-pay

## 2017-06-27 ENCOUNTER — Encounter
Admission: RE | Admit: 2017-06-27 | Discharge: 2017-06-27 | Disposition: A | Discharge status: Home / Self Care | Payer: PPO | Source: Ambulatory Visit | Attending: Orthopedic Surgery | Admitting: Orthopedic Surgery

## 2017-06-27 DIAGNOSIS — Z01812 Encounter for preprocedural laboratory examination: Secondary | ICD-10-CM | POA: Diagnosis not present

## 2017-06-27 DIAGNOSIS — I10 Essential (primary) hypertension: Secondary | ICD-10-CM | POA: Diagnosis not present

## 2017-06-27 DIAGNOSIS — Z01810 Encounter for preprocedural cardiovascular examination: Secondary | ICD-10-CM | POA: Insufficient documentation

## 2017-06-27 HISTORY — DX: Essential (primary) hypertension: I10

## 2017-06-27 LAB — URINALYSIS, ROUTINE W REFLEX MICROSCOPIC
Bilirubin Urine: NEGATIVE
Glucose, UA: NEGATIVE mg/dL
Hgb urine dipstick: NEGATIVE
Ketones, ur: NEGATIVE mg/dL
Leukocytes, UA: NEGATIVE
Nitrite: NEGATIVE
Protein, ur: NEGATIVE mg/dL
Specific Gravity, Urine: 1.013 (ref 1.005–1.030)
pH: 6 (ref 5.0–8.0)

## 2017-06-27 LAB — SURGICAL PCR SCREEN
MRSA, PCR: NEGATIVE
Staphylococcus aureus: NEGATIVE

## 2017-06-27 LAB — COMPREHENSIVE METABOLIC PANEL
ALT: 16 U/L (ref 14–54)
AST: 20 U/L (ref 15–41)
Albumin: 4.6 g/dL (ref 3.5–5.0)
Alkaline Phosphatase: 81 U/L (ref 38–126)
Anion gap: 8 (ref 5–15)
BUN: 16 mg/dL (ref 6–20)
CO2: 27 mmol/L (ref 22–32)
Calcium: 9.4 mg/dL (ref 8.9–10.3)
Chloride: 104 mmol/L (ref 101–111)
Creatinine, Ser: 0.56 mg/dL (ref 0.44–1.00)
GFR calc Af Amer: >60 mL/min (ref >60)
GFR calc non Af Amer: >60 mL/min (ref >60)
Glucose, Bld: 100 mg/dL — ABNORMAL HIGH (ref 65–99)
Potassium: 4 mmol/L (ref 3.5–5.1)
Sodium: 139 mmol/L (ref 135–145)
Total Bilirubin: 0.8 mg/dL (ref 0.3–1.2)
Total Protein: 7.7 g/dL (ref 6.5–8.1)

## 2017-06-27 LAB — CBC
HCT: 42 % (ref 35.0–47.0)
Hemoglobin: 13.9 g/dL (ref 12.0–16.0)
MCH: 30.4 pg (ref 26.0–34.0)
MCHC: 33 g/dL (ref 32.0–36.0)
MCV: 92.1 fL (ref 80.0–100.0)
Platelets: 218 10*3/uL (ref 150–440)
RBC: 4.56 MIL/uL (ref 3.80–5.20)
RDW: 13.7 % (ref 11.5–14.5)
WBC: 6.7 10*3/uL (ref 3.6–11.0)

## 2017-06-27 LAB — APTT: aPTT: 30 seconds (ref 24–36)

## 2017-06-27 LAB — SEDIMENTATION RATE: Sed Rate: 13 mm/hr (ref 0–30)

## 2017-06-27 LAB — TYPE AND SCREEN
ABO/RH(D): O POS
Antibody Screen: NEGATIVE

## 2017-06-27 LAB — C-REACTIVE PROTEIN: CRP: <0.8 mg/dL (ref <1.0)

## 2017-06-27 LAB — PROTIME-INR
INR: 0.98
Prothrombin Time: 12.9 seconds (ref 11.4–15.2)

## 2017-06-27 NOTE — Patient Instructions (Signed)
Your procedure is scheduled on: Mon. 07/07/17 Report to Day Surgery. To find out your arrival time please call [phone] between 1PM - 3PM on Friday 07/04/17  Remember: Instructions that are not followed completely may result in serious medical risk, up to and including death, or upon the discretion of your surgeon and anesthesiologist your surgery may need to be rescheduled.     _X__ 1. Do not eat food after midnight the night before your procedure.                 No gum chewing or hard candies. You may drink clear liquids up to 2 hours                 before you are scheduled to arrive for your surgery- DO not drink clear                 liquids within 2 hours of the start of your surgery.                 Clear Liquids include:  water, apple juice without pulp, clear carbohydrate                 drink such as Clearfast of Gartorade, Black Coffee or Tea (Do not add                 anything to coffee or tea).     _X__ 2.  No Alcohol for 24 hours before or after surgery.   ___ 3.  Do Not Smoke or use e-cigarettes For 24 Hours Prior to Your Surgery.                 Do not use any chewable tobacco products for at least 6 hours prior to                 surgery.  ____  4.  Bring all medications with you on the day of surgery if instructed.   _x___  5.  Notify your doctor if there is any change in your medical condition      (cold, fever, infections).     Do not wear jewelry, make-up, hairpins, clips or nail polish. Do not wear lotions, powders, or perfumes. You may wear deodorant. Do not shave 48 hours prior to surgery. Men may shave face and neck. Do not bring valuables to the hospital.    [facility] is not responsible for any belongings or valuables.  Contacts, dentures or bridgework may not be worn into surgery. Leave your suitcase in the car. After surgery it may be brought to your room. For patients admitted to the hospital, discharge time is determined  by your treatment team.   Patients discharged the day of surgery will not be allowed to drive home.   Please read over the following fact sheets that you were given:   MRSA Information   _x___ Take these medicines the morning of surgery with A SIP OF WATER:    1. acetaminophen (TYLENOL) 500 MG tablet if needed  2. atenolol (TENORMIN) 50 MG tablet  3. omeprazole (PRILOSEC) 20 MG capsule  4.  5.  6.  ____ Fleet Enema (as directed)   _x___ Use CHG Soap as directed  ____ Use inhalers on the day of surgery  ____ Stop metformin 2 days prior to surgery    ____ Take 1/2 of usual insulin dose the night before surgery. No insulin the morning  of surgery.   ____ Stop Coumadin/Plavix/aspirin on   _x___ Stop Anti-inflammatories on 1 week prior   _x___ Stop supplements until after surgery.  1 week prior  ____ Bring C-Pap to the hospital.

## 2017-06-28 LAB — URINE CULTURE
Culture: NO GROWTH
Special Requests: NORMAL

## 2017-07-06 MED ORDER — TRANEXAMIC ACID 1000 MG/10ML IV SOLN
1000.0000 mg | INTRAVENOUS | Status: DC
  Filled 2017-07-06: qty 10

## 2017-07-06 MED ORDER — CLINDAMYCIN PHOSPHATE 900 MG/50ML IV SOLN: 900.0000 mg | INTRAVENOUS | Status: DC

## 2017-07-06 NOTE — Discharge Instructions (Signed)
°  Instructions after Total Knee Replacement ° ° Maalik Pinn P. Aaiden Depoy, Jr., M.D.    ° Dept. of Orthopaedics & Sports Medicine ° [facility] ° [address] ° [place], [place]  [address] ° Phone: [phone]   Fax: [phone] ° °  °DIET: °• Drink plenty of non-alcoholic fluids. °• Resume your normal diet. Include foods high in fiber. ° °ACTIVITY:  °• You may use crutches or a walker with weight-bearing as tolerated, unless instructed otherwise. °• You may be weaned off of the walker or crutches by your Physical Therapist.  °• Do NOT place pillows under the knee. Anything placed under the knee could limit your ability to straighten the knee.   °• Continue doing gentle exercises. Exercising will reduce the pain and swelling, increase motion, and prevent muscle weakness.   °• Please continue to use the TED compression stockings for 6 weeks. You may remove the stockings at night, but should reapply them in the morning. °• Do not drive or operate any equipment until instructed. ° °WOUND CARE:  °• Continue to use the PolarCare or ice packs periodically to reduce pain and swelling. °• You may bathe or shower after the staples are removed at the first office visit following surgery. ° °MEDICATIONS: °• You may resume your regular medications. °• Please take the pain medication as prescribed on the medication. °• Do not take pain medication on an empty stomach. °• You have been given a prescription for a blood thinner (Lovenox or Coumadin). Please take the medication as instructed. (NOTE: After completing a 2 week course of Lovenox, take one Enteric-coated aspirin once a day. This along with elevation will help reduce the possibility of phlebitis in your operated leg.) °• Do not drive or drink alcoholic beverages when taking pain medications. ° °CALL THE OFFICE FOR: °• Temperature above 101 degrees °• Excessive bleeding or drainage on the dressing. °• Excessive swelling, coldness, or paleness of the toes. °• Persistent  nausea and vomiting. ° °FOLLOW-UP:  °• You should have an appointment to return to the office in 10-14 days after surgery. °• Arrangements have been made for continuation of Physical Therapy (either home therapy or outpatient therapy). °  °

## 2017-07-07 ENCOUNTER — Inpatient Hospital Stay: Payer: PPO | Admitting: Anesthesiology

## 2017-07-07 ENCOUNTER — Inpatient Hospital Stay
Admission: RE | Admit: 2017-07-07 | Discharge: 2017-07-09 | DRG: 470 | Disposition: A | Discharge status: Home Health | Payer: PPO | Source: Ambulatory Visit | Attending: Orthopedic Surgery | Admitting: Orthopedic Surgery

## 2017-07-07 ENCOUNTER — Encounter
Admission: RE | Disposition: A | Discharge status: Home Health | Payer: Self-pay | Source: Ambulatory Visit | Attending: Orthopedic Surgery

## 2017-07-07 ENCOUNTER — Other Ambulatory Visit: Payer: Self-pay

## 2017-07-07 ENCOUNTER — Inpatient Hospital Stay: Payer: PPO

## 2017-07-07 ENCOUNTER — Encounter: Payer: Self-pay | Admitting: Orthopedic Surgery

## 2017-07-07 DIAGNOSIS — G2581 Restless legs syndrome: Secondary | ICD-10-CM | POA: Diagnosis present

## 2017-07-07 DIAGNOSIS — Z79899 Other long term (current) drug therapy: Secondary | ICD-10-CM

## 2017-07-07 DIAGNOSIS — Z881 Allergy status to other antibiotic agents status: Secondary | ICD-10-CM | POA: Diagnosis not present

## 2017-07-07 DIAGNOSIS — G4730 Sleep apnea, unspecified: Secondary | ICD-10-CM | POA: Diagnosis present

## 2017-07-07 DIAGNOSIS — M1711 Unilateral primary osteoarthritis, right knee: Secondary | ICD-10-CM | POA: Diagnosis not present

## 2017-07-07 DIAGNOSIS — Z96642 Presence of left artificial hip joint: Secondary | ICD-10-CM | POA: Diagnosis not present

## 2017-07-07 DIAGNOSIS — K219 Gastro-esophageal reflux disease without esophagitis: Secondary | ICD-10-CM | POA: Diagnosis present

## 2017-07-07 DIAGNOSIS — I10 Essential (primary) hypertension: Secondary | ICD-10-CM | POA: Diagnosis not present

## 2017-07-07 DIAGNOSIS — Z9189 Other specified personal risk factors, not elsewhere classified: Secondary | ICD-10-CM | POA: Diagnosis not present

## 2017-07-07 DIAGNOSIS — M545 Low back pain: Secondary | ICD-10-CM | POA: Diagnosis not present

## 2017-07-07 DIAGNOSIS — Z96659 Presence of unspecified artificial knee joint: Secondary | ICD-10-CM

## 2017-07-07 DIAGNOSIS — Q675 Congenital deformity of spine: Secondary | ICD-10-CM

## 2017-07-07 DIAGNOSIS — Z880 Allergy status to penicillin: Secondary | ICD-10-CM | POA: Diagnosis not present

## 2017-07-07 DIAGNOSIS — Z96651 Presence of right artificial knee joint: Secondary | ICD-10-CM | POA: Diagnosis not present

## 2017-07-07 DIAGNOSIS — Z471 Aftercare following joint replacement surgery: Secondary | ICD-10-CM | POA: Diagnosis not present

## 2017-07-07 DIAGNOSIS — Z96652 Presence of left artificial knee joint: Secondary | ICD-10-CM | POA: Diagnosis present

## 2017-07-07 HISTORY — PX: KNEE ARTHROPLASTY: SHX992

## 2017-07-07 SURGERY — ARTHROPLASTY, KNEE, TOTAL, USING IMAGELESS COMPUTER-ASSISTED NAVIGATION
Anesthesia: General | Laterality: Right

## 2017-07-07 MED ORDER — ENOXAPARIN SODIUM 30 MG/0.3ML ~~LOC~~ SOLN
30.0000 mg | Freq: Two times a day (BID) | SUBCUTANEOUS | Status: DC
  Administered 2017-07-08 – 2017-07-09 (×3): 30 mg via SUBCUTANEOUS
  Filled 2017-07-07 (×3): qty 0.3

## 2017-07-07 MED ORDER — TRAMADOL HCL 50 MG PO TABS
50.0000 mg | ORAL_TABLET | ORAL | Status: DC | PRN
  Administered 2017-07-07 – 2017-07-09 (×7): 100 mg via ORAL
  Filled 2017-07-07 (×7): qty 2

## 2017-07-07 MED ORDER — MEPERIDINE HCL 50 MG/ML IJ SOLN: 6.2500 mg | INTRAMUSCULAR | Status: DC | PRN

## 2017-07-07 MED ORDER — MAGNESIUM HYDROXIDE 400 MG/5ML PO SUSP
30.0000 mL | Freq: Every day | ORAL | Status: DC | PRN
  Administered 2017-07-08: 30 mL via ORAL
  Filled 2017-07-07: qty 30

## 2017-07-07 MED ORDER — OXYCODONE HCL 5 MG PO TABS: 5.0000 mg | ORAL_TABLET | Freq: Once | ORAL | Status: DC | PRN

## 2017-07-07 MED ORDER — ACETAMINOPHEN 10 MG/ML IV SOLN
INTRAVENOUS | Status: AC
  Filled 2017-07-07: qty 100

## 2017-07-07 MED ORDER — ONDANSETRON HCL 4 MG PO TABS: 4.0000 mg | ORAL_TABLET | Freq: Four times a day (QID) | ORAL | Status: DC | PRN

## 2017-07-07 MED ORDER — PHENOL 1.4 % MT LIQD
1.0000 | OROMUCOSAL | Status: DC | PRN
  Filled 2017-07-07: qty 177

## 2017-07-07 MED ORDER — ROCURONIUM BROMIDE 50 MG/5ML IV SOLN
INTRAVENOUS | Status: AC
  Filled 2017-07-07: qty 1

## 2017-07-07 MED ORDER — LIDOCAINE HCL (CARDIAC) 20 MG/ML IV SOLN
INTRAVENOUS | Status: DC | PRN
  Administered 2017-07-07: 100 mg via INTRAVENOUS

## 2017-07-07 MED ORDER — CLINDAMYCIN PHOSPHATE 900 MG/50ML IV SOLN
INTRAVENOUS | Status: DC | PRN
  Administered 2017-07-07: 900 mg via INTRAVENOUS

## 2017-07-07 MED ORDER — SUGAMMADEX SODIUM 500 MG/5ML IV SOLN
INTRAVENOUS | Status: DC | PRN
  Administered 2017-07-07: 200 mg via INTRAVENOUS

## 2017-07-07 MED ORDER — OXYCODONE HCL 5 MG PO TABS: 5.0000 mg | ORAL_TABLET | ORAL | Status: DC | PRN

## 2017-07-07 MED ORDER — PANTOPRAZOLE SODIUM 40 MG PO TBEC
40.0000 mg | DELAYED_RELEASE_TABLET | Freq: Two times a day (BID) | ORAL | Status: DC
  Administered 2017-07-07 – 2017-07-09 (×4): 40 mg via ORAL
  Filled 2017-07-07 (×4): qty 1

## 2017-07-07 MED ORDER — CELECOXIB 200 MG PO CAPS
400.0000 mg | ORAL_CAPSULE | Freq: Once | ORAL | Status: AC
  Administered 2017-07-07: 400 mg via ORAL

## 2017-07-07 MED ORDER — OXYCODONE HCL 5 MG/5ML PO SOLN: 5.0000 mg | Freq: Once | ORAL | Status: DC | PRN

## 2017-07-07 MED ORDER — MORPHINE SULFATE (PF) 2 MG/ML IV SOLN: 2.0000 mg | INTRAVENOUS | Status: DC | PRN

## 2017-07-07 MED ORDER — GABAPENTIN 300 MG PO CAPS
300.0000 mg | ORAL_CAPSULE | Freq: Once | ORAL | Status: AC
Start: 2017-07-07 — End: 2017-07-07
  Administered 2017-07-07: 300 mg via ORAL

## 2017-07-07 MED ORDER — REMIFENTANIL HCL 1 MG IV SOLR
INTRAVENOUS | Status: AC
Start: 2017-07-07 — End: ?
  Filled 2017-07-07: qty 2000

## 2017-07-07 MED ORDER — ONDANSETRON HCL 4 MG/2ML IJ SOLN
INTRAMUSCULAR | Status: DC | PRN
  Administered 2017-07-07: 4 mg via INTRAVENOUS

## 2017-07-07 MED ORDER — HYDROMORPHONE HCL 1 MG/ML IJ SOLN
INTRAMUSCULAR | Status: AC
  Filled 2017-07-07: qty 1

## 2017-07-07 MED ORDER — MENTHOL 3 MG MT LOZG
1.0000 | LOZENGE | OROMUCOSAL | Status: DC | PRN
  Filled 2017-07-07: qty 9

## 2017-07-07 MED ORDER — SODIUM CHLORIDE 0.9 % IJ SOLN
INTRAMUSCULAR | Status: DC | PRN
  Administered 2017-07-07: 40 mL via INTRAVENOUS

## 2017-07-07 MED ORDER — CELECOXIB 200 MG PO CAPS
200.0000 mg | ORAL_CAPSULE | Freq: Two times a day (BID) | ORAL | Status: DC
  Administered 2017-07-07 – 2017-07-09 (×4): 200 mg via ORAL
  Filled 2017-07-07 (×4): qty 1

## 2017-07-07 MED ORDER — METOCLOPRAMIDE HCL 10 MG PO TABS
10.0000 mg | ORAL_TABLET | Freq: Three times a day (TID) | ORAL | Status: DC
  Administered 2017-07-07 – 2017-07-09 (×7): 10 mg via ORAL
  Filled 2017-07-07 (×7): qty 1

## 2017-07-07 MED ORDER — ACETAMINOPHEN 325 MG PO TABS: 650.0000 mg | ORAL_TABLET | ORAL | Status: DC | PRN

## 2017-07-07 MED ORDER — GABAPENTIN 300 MG PO CAPS
300.0000 mg | ORAL_CAPSULE | Freq: Every day | ORAL | Status: DC
  Administered 2017-07-07 – 2017-07-08 (×2): 300 mg via ORAL
  Filled 2017-07-07 (×2): qty 1

## 2017-07-07 MED ORDER — TRANEXAMIC ACID 1000 MG/10ML IV SOLN
1000.0000 mg | Freq: Once | INTRAVENOUS | Status: AC
  Administered 2017-07-07: 1000 mg via INTRAVENOUS
  Filled 2017-07-07: qty 10

## 2017-07-07 MED ORDER — BUPIVACAINE HCL (PF) 0.25 % IJ SOLN
INTRAMUSCULAR | Status: DC | PRN
  Administered 2017-07-07: 60 mL

## 2017-07-07 MED ORDER — FLEET ENEMA 7-19 GM/118ML RE ENEM: 1.0000 | ENEMA | Freq: Once | RECTAL | Status: DC | PRN

## 2017-07-07 MED ORDER — FERROUS SULFATE 325 (65 FE) MG PO TABS
325.0000 mg | ORAL_TABLET | Freq: Two times a day (BID) | ORAL | Status: DC
  Administered 2017-07-07 – 2017-07-09 (×4): 325 mg via ORAL
  Filled 2017-07-07 (×4): qty 1

## 2017-07-07 MED ORDER — ACETAMINOPHEN 10 MG/ML IV SOLN
1000.0000 mg | Freq: Four times a day (QID) | INTRAVENOUS | Status: AC
  Administered 2017-07-07 – 2017-07-08 (×3): 1000 mg via INTRAVENOUS
  Filled 2017-07-07 (×4): qty 100

## 2017-07-07 MED ORDER — VITAMIN D3 25 MCG (1000 UNIT) PO TABS
2000.0000 [IU] | ORAL_TABLET | Freq: Every day | ORAL | Status: DC
  Administered 2017-07-07 – 2017-07-09 (×3): 2000 [IU] via ORAL
  Filled 2017-07-07 (×6): qty 2

## 2017-07-07 MED ORDER — ALUM & MAG HYDROXIDE-SIMETH 200-200-20 MG/5ML PO SUSP: 30.0000 mL | ORAL | Status: DC | PRN

## 2017-07-07 MED ORDER — LIDOCAINE HCL (PF) 2 % IJ SOLN
INTRAMUSCULAR | Status: AC
  Filled 2017-07-07: qty 10

## 2017-07-07 MED ORDER — ATENOLOL 25 MG PO TABS
50.0000 mg | ORAL_TABLET | Freq: Every day | ORAL | Status: DC
  Administered 2017-07-08 – 2017-07-09 (×2): 50 mg via ORAL
  Filled 2017-07-07 (×2): qty 2

## 2017-07-07 MED ORDER — HAIR SKIN NAILS PO CAPS: 1.0000 | ORAL_CAPSULE | Freq: Two times a day (BID) | ORAL | Status: DC

## 2017-07-07 MED ORDER — DIPHENHYDRAMINE HCL 12.5 MG/5ML PO ELIX: 12.5000 mg | ORAL_SOLUTION | ORAL | Status: DC | PRN

## 2017-07-07 MED ORDER — ROCURONIUM BROMIDE 100 MG/10ML IV SOLN
INTRAVENOUS | Status: DC | PRN
  Administered 2017-07-07: 50 mg via INTRAVENOUS
  Administered 2017-07-07: 20 mg via INTRAVENOUS
  Administered 2017-07-07: 10 mg via INTRAVENOUS

## 2017-07-07 MED ORDER — SODIUM CHLORIDE 0.9 % IV SOLN
INTRAVENOUS | Status: DC
  Administered 2017-07-07: 15:00:00 via INTRAVENOUS

## 2017-07-07 MED ORDER — OXYCODONE HCL 5 MG PO TABS
10.0000 mg | ORAL_TABLET | ORAL | Status: DC | PRN
  Administered 2017-07-07 – 2017-07-09 (×10): 10 mg via ORAL
  Filled 2017-07-07 (×10): qty 2

## 2017-07-07 MED ORDER — HYDROMORPHONE HCL 1 MG/ML IJ SOLN
INTRAMUSCULAR | Status: AC
Start: 2017-07-07 — End: ?
  Filled 2017-07-07: qty 1

## 2017-07-07 MED ORDER — SODIUM CHLORIDE 0.9 % IV SOLN
INTRAVENOUS | Status: DC | PRN
  Administered 2017-07-07: 60 mL

## 2017-07-07 MED ORDER — SENNOSIDES-DOCUSATE SODIUM 8.6-50 MG PO TABS
1.0000 | ORAL_TABLET | Freq: Two times a day (BID) | ORAL | Status: DC
  Administered 2017-07-07 – 2017-07-08 (×2): 1 via ORAL
  Filled 2017-07-07 (×4): qty 1

## 2017-07-07 MED ORDER — CLINDAMYCIN PHOSPHATE 600 MG/50ML IV SOLN
600.0000 mg | Freq: Four times a day (QID) | INTRAVENOUS | Status: AC
Start: 2017-07-07 — End: 2017-07-08
  Administered 2017-07-07 – 2017-07-08 (×4): 600 mg via INTRAVENOUS
  Filled 2017-07-07 (×4): qty 50

## 2017-07-07 MED ORDER — DEXAMETHASONE SODIUM PHOSPHATE 10 MG/ML IJ SOLN
8.0000 mg | Freq: Once | INTRAMUSCULAR | Status: AC
  Administered 2017-07-07: 8 mg via INTRAVENOUS

## 2017-07-07 MED ORDER — PROPOFOL 10 MG/ML IV BOLUS
INTRAVENOUS | Status: DC | PRN
  Administered 2017-07-07: 180 mg via INTRAVENOUS
  Administered 2017-07-07: 20 mg via INTRAVENOUS

## 2017-07-07 MED ORDER — CHLORHEXIDINE GLUCONATE 4 % EX LIQD: 60.0000 mL | Freq: Once | CUTANEOUS | Status: DC

## 2017-07-07 MED ORDER — PRAMIPEXOLE DIHYDROCHLORIDE 0.25 MG PO TABS
1.0000 mg | ORAL_TABLET | Freq: Every day | ORAL | Status: DC
  Administered 2017-07-07 – 2017-07-08 (×2): 1 mg via ORAL
  Filled 2017-07-07 (×2): qty 4

## 2017-07-07 MED ORDER — NEOMYCIN-POLYMYXIN B GU 40-200000 IR SOLN
Status: DC | PRN
  Administered 2017-07-07: 14 mL

## 2017-07-07 MED ORDER — FENTANYL CITRATE (PF) 100 MCG/2ML IJ SOLN
25.0000 ug | INTRAMUSCULAR | Status: DC | PRN
  Administered 2017-07-07: 50 ug via INTRAVENOUS
  Administered 2017-07-07 (×2): 25 ug via INTRAVENOUS

## 2017-07-07 MED ORDER — ONDANSETRON HCL 4 MG/2ML IJ SOLN: 4.0000 mg | Freq: Four times a day (QID) | INTRAMUSCULAR | Status: DC | PRN

## 2017-07-07 MED ORDER — CALCIUM-PHOSPHORUS-VITAMIN D 250-107-500 MG-MG-UNIT PO CHEW: 1.0000 | CHEWABLE_TABLET | Freq: Two times a day (BID) | ORAL | Status: DC

## 2017-07-07 MED ORDER — PHENYLEPHRINE HCL 10 MG/ML IJ SOLN
INTRAMUSCULAR | Status: DC | PRN
  Administered 2017-07-07: 100 ug via INTRAVENOUS
  Administered 2017-07-07: 50 ug via INTRAVENOUS
  Administered 2017-07-07 (×2): 100 ug via INTRAVENOUS

## 2017-07-07 MED ORDER — SODIUM CHLORIDE 0.9 % IV SOLN
INTRAVENOUS | Status: DC | PRN
  Administered 2017-07-07: 1000 mg via INTRAVENOUS

## 2017-07-07 MED ORDER — BISACODYL 10 MG RE SUPP: 10.0000 mg | Freq: Every day | RECTAL | Status: DC | PRN

## 2017-07-07 MED ORDER — ACETAMINOPHEN 10 MG/ML IV SOLN
INTRAVENOUS | Status: DC | PRN
  Administered 2017-07-07: 1000 mg via INTRAVENOUS

## 2017-07-07 MED ORDER — LACTATED RINGERS IV SOLN
INTRAVENOUS | Status: DC
  Administered 2017-07-07 (×2): via INTRAVENOUS

## 2017-07-07 MED ORDER — PROPOFOL 10 MG/ML IV BOLUS
INTRAVENOUS | Status: AC
  Filled 2017-07-07: qty 20

## 2017-07-07 MED ORDER — ACETAMINOPHEN 650 MG RE SUPP: 650.0000 mg | RECTAL | Status: DC | PRN

## 2017-07-07 MED ORDER — REMIFENTANIL HCL 1 MG IV SOLR
INTRAVENOUS | Status: DC | PRN
  Administered 2017-07-07: .05 ug/kg/min via INTRAVENOUS

## 2017-07-07 MED ORDER — PROMETHAZINE HCL 25 MG/ML IJ SOLN: 6.2500 mg | INTRAMUSCULAR | Status: DC | PRN

## 2017-07-07 MED ORDER — HYDROMORPHONE HCL 1 MG/ML IJ SOLN
INTRAMUSCULAR | Status: DC | PRN
  Administered 2017-07-07 (×2): 1 mg via INTRAVENOUS

## 2017-07-07 MED ORDER — FENTANYL CITRATE (PF) 100 MCG/2ML IJ SOLN
INTRAMUSCULAR | Status: AC
  Administered 2017-07-07: 50 ug via INTRAVENOUS
  Filled 2017-07-07: qty 2

## 2017-07-07 SURGICAL SUPPLY — 68 items
ATTUNE PSRP INSR SZ6 5 KNEE (Insert) ×1 IMPLANT
ATTUNE PSRP INSR SZ6 5MM KNEE (Insert) ×1 IMPLANT
BATTERY INSTRU NAVIGATION (MISCELLANEOUS) ×12 IMPLANT
BLADE SAW 1 (BLADE) ×3 IMPLANT
BLADE SAW 1/2 (BLADE) ×3 IMPLANT
BLADE SAW 70X12.5 (BLADE) ×2 IMPLANT
BTRY SRG DRVR LF (MISCELLANEOUS) ×4
CANISTER SUCT 1200ML W/VALVE (MISCELLANEOUS) ×3 IMPLANT
CANISTER SUCT 3000ML PPV (MISCELLANEOUS) ×6 IMPLANT
CAPT KNEE TOTAL 3 ATTUNE ×2 IMPLANT
CEMENT HV SMART SET (Cement) ×6 IMPLANT
COOLER POLAR GLACIER W/PUMP (MISCELLANEOUS) ×3 IMPLANT
CUFF TOURN 24 STER (MISCELLANEOUS) IMPLANT
CUFF TOURN 30 STER DUAL PORT (MISCELLANEOUS) ×2 IMPLANT
DRAPE SHEET LG 3/4 BI-LAMINATE (DRAPES) ×3 IMPLANT
DRSG DERMACEA 8X12 NADH (GAUZE/BANDAGES/DRESSINGS) ×3 IMPLANT
DRSG OPSITE POSTOP 4X14 (GAUZE/BANDAGES/DRESSINGS) ×3 IMPLANT
DRSG TEGADERM 4X4.75 (GAUZE/BANDAGES/DRESSINGS) ×3 IMPLANT
DURAPREP 26ML APPLICATOR (WOUND CARE) ×6 IMPLANT
ELECT CAUTERY BLADE 6.4 (BLADE) ×3 IMPLANT
ELECT REM PT RETURN 9FT ADLT (ELECTROSURGICAL) ×3
ELECTRODE REM PT RTRN 9FT ADLT (ELECTROSURGICAL) ×1 IMPLANT
EVACUATOR 1/8 PVC DRAIN (DRAIN) ×3 IMPLANT
EX-PIN ORTHOLOCK NAV 4X150 (PIN) ×6 IMPLANT
GLOVE BIOGEL M STRL SZ7.5 (GLOVE) ×6 IMPLANT
GLOVE BIOGEL PI IND STRL 9 (GLOVE) ×1 IMPLANT
GLOVE BIOGEL PI INDICATOR 9 (GLOVE) ×2
GLOVE INDICATOR 8.0 STRL GRN (GLOVE) ×3 IMPLANT
GLOVE SURG SYN 9.0  PF PI (GLOVE) ×2
GLOVE SURG SYN 9.0 PF PI (GLOVE) ×1 IMPLANT
GOWN STRL REUS W/ TWL LRG LVL3 (GOWN DISPOSABLE) ×2 IMPLANT
GOWN STRL REUS W/TWL 2XL LVL3 (GOWN DISPOSABLE) ×3 IMPLANT
GOWN STRL REUS W/TWL LRG LVL3 (GOWN DISPOSABLE) ×6
HOLDER FOLEY CATH W/STRAP (MISCELLANEOUS) ×3 IMPLANT
HOOD PEEL AWAY FLYTE STAYCOOL (MISCELLANEOUS) ×6 IMPLANT
KIT RM TURNOVER STRD PROC AR (KITS) ×3 IMPLANT
KNIFE SCULPS 14X20 (INSTRUMENTS) ×3 IMPLANT
LABEL OR SOLS (LABEL) ×3 IMPLANT
NDL SAFETY ECLIPSE 18X1.5 (NEEDLE) ×1 IMPLANT
NDL SPNL 20GX3.5 QUINCKE YW (NEEDLE) ×2 IMPLANT
NEEDLE HYPO 18GX1.5 SHARP (NEEDLE) ×3
NEEDLE SPNL 20GX3.5 QUINCKE YW (NEEDLE) ×6 IMPLANT
NS IRRIG 500ML POUR BTL (IV SOLUTION) ×3 IMPLANT
PACK TOTAL KNEE (MISCELLANEOUS) ×3 IMPLANT
PAD WRAPON POLAR KNEE (MISCELLANEOUS) ×1 IMPLANT
PIN DRILL QUICK PACK ×3 IMPLANT
PIN FIXATION 1/8DIA X 3INL (PIN) ×3 IMPLANT
PULSAVAC PLUS IRRIG FAN TIP (DISPOSABLE) ×3
SOL .9 NS 3000ML IRR  AL (IV SOLUTION) ×2
SOL .9 NS 3000ML IRR AL (IV SOLUTION) ×1
SOL .9 NS 3000ML IRR UROMATIC (IV SOLUTION) ×1 IMPLANT
SOL PREP PVP 2OZ (MISCELLANEOUS) ×3
SOLUTION PREP PVP 2OZ (MISCELLANEOUS) ×1 IMPLANT
SPONGE DRAIN TRACH 4X4 STRL 2S (GAUZE/BANDAGES/DRESSINGS) ×3 IMPLANT
STAPLER SKIN PROX 35W (STAPLE) ×3 IMPLANT
STRAP TIBIA SHORT (MISCELLANEOUS) ×3 IMPLANT
SUCTION FRAZIER HANDLE 10FR (MISCELLANEOUS) ×2
SUCTION TUBE FRAZIER 10FR DISP (MISCELLANEOUS) ×1 IMPLANT
SUT VIC AB 0 CT1 36 (SUTURE) ×3 IMPLANT
SUT VIC AB 1 CT1 36 (SUTURE) ×6 IMPLANT
SUT VIC AB 2-0 CT2 27 (SUTURE) ×3 IMPLANT
SYR 20CC LL (SYRINGE) ×3 IMPLANT
SYR 30ML LL (SYRINGE) ×6 IMPLANT
TIP FAN IRRIG PULSAVAC PLUS (DISPOSABLE) ×1 IMPLANT
TOWEL OR 17X26 4PK STRL BLUE (TOWEL DISPOSABLE) ×3 IMPLANT
TOWER CARTRIDGE SMART MIX (DISPOSABLE) ×3 IMPLANT
TRAY FOLEY W/METER SILVER 16FR (SET/KITS/TRAYS/PACK) ×3 IMPLANT
WRAPON POLAR PAD KNEE (MISCELLANEOUS) ×3

## 2017-07-07 NOTE — Progress Notes (Signed)
Pt admitted to room 140 from PACU, awake, A&Ox4. Polar care, hemovac, and foley are in place. Pt is oriented to room and unit routine and educated on pain medication regimen. Pt placed in bone foam.   Claudia Terry, Claudia Terry

## 2017-07-07 NOTE — Transfer of Care (Signed)
Immediate Anesthesia Transfer of Care Note  Patient: Claudia Terry  Procedure(s) Performed: COMPUTER ASSISTED TOTAL KNEE ARTHROPLASTY (Right )  Patient Location: PACU  Anesthesia Type:General  Level of Consciousness: awake, oriented and patient cooperative  Airway & Oxygen Therapy: Patient Spontanous Breathing and Patient connected to nasal cannula oxygen  Post-op Assessment: Report given to RN and Post -op Vital signs reviewed and stable  Post vital signs: Reviewed and stable  Last Vitals:  Vitals:   07/07/17 0948  BP: 135/74  Pulse: 77  Resp: 17  Temp: 37.1 C  SpO2: 98%    Last Pain:  Vitals:   07/07/17 0948  TempSrc: Oral  PainSc: 2          Complications: No apparent anesthesia complications

## 2017-07-07 NOTE — Anesthesia Post-op Follow-up Note (Signed)
Anesthesia QCDR form completed.        

## 2017-07-07 NOTE — Anesthesia Procedure Notes (Signed)
Procedure Name: Intubation Date/Time: 07/07/2017 10:27 AM Performed by: Bernardo Heater, CRNA Pre-anesthesia Checklist: Patient identified, Emergency Drugs available, Suction available and Patient being monitored Patient Re-evaluated:Patient Re-evaluated prior to induction Oxygen Delivery Method: Circle system utilized Preoxygenation: Pre-oxygenation with 100% oxygen Induction Type: IV induction Laryngoscope Size: Mac and 3 Grade View: Grade I Tube size: 7.0 mm Number of attempts: 1 Placement Confirmation: ETT inserted through vocal cords under direct vision,  positive ETCO2 and breath sounds checked- equal and bilateral Secured at: 21 cm Tube secured with: Tape Dental Injury: Teeth and Oropharynx as per pre-operative assessment

## 2017-07-07 NOTE — Op Note (Signed)
OPERATIVE NOTE  DATE OF SURGERY:  07/07/2017  PATIENT NAME:  Claudia Terry   DOB: 1946/01/09  MRN: [id number]  PRE-OPERATIVE DIAGNOSIS: Degenerative arthrosis of the right knee, primary  POST-OPERATIVE DIAGNOSIS:  Same  PROCEDURE:  Right total knee arthroplasty using computer-assisted navigation  SURGEON:  Marciano Sequin. M.D.  ASSISTANT:  Vance Peper, PA (present and scrubbed throughout the case, critical for assistance with exposure, retraction, instrumentation, and closure)  ANESTHESIA: general  ESTIMATED BLOOD LOSS: 100 mL  FLUIDS REPLACED: 1600 mL of crystalloid  TOURNIQUET TIME: 87 minutes  DRAINS: 2 medium Hemovac drains  SOFT TISSUE RELEASES: Anterior cruciate ligament, posterior cruciate ligament, deep medial collateral ligament, patellofemoral ligament  IMPLANTS UTILIZED: DePuy Attune size 6 posterior stabilized femoral component (cemented), size 6 rotating platform tibial component (cemented), 38 mm medialized dome patella (cemented), and a 5 mm stabilized rotating platform polyethylene insert.  INDICATIONS FOR SURGERY: Claudia Terry is a 72 y.o. year old female with a long history of progressive knee pain. X-rays demonstrated severe degenerative changes in tricompartmental fashion. The patient had not seen any significant improvement despite conservative nonsurgical intervention. After discussion of the risks and benefits of surgical intervention, the patient expressed understanding of the risks benefits and agree with plans for total knee arthroplasty.   The risks, benefits, and alternatives were discussed at length including but not limited to the risks of infection, bleeding, nerve injury, stiffness, blood clots, the need for revision surgery, cardiopulmonary complications, among others, and they were willing to proceed.  PROCEDURE IN DETAIL: The patient was brought into the operating room and, after adequate general anesthesia was achieved, a tourniquet was placed  on the patient's upper thigh. The patient's knee and leg were cleaned and prepped with alcohol and DuraPrep and draped in the usual sterile fashion. A "timeout" was performed as per usual protocol. The lower extremity was exsanguinated using an Esmarch, and the tourniquet was inflated to 300 mmHg. An anterior longitudinal incision was made followed by a standard mid vastus approach. The deep fibers of the medial collateral ligament were elevated in a subperiosteal fashion off of the medial flare of the tibia so as to maintain a continuous soft tissue sleeve. The patella was subluxed laterally and the patellofemoral ligament was incised. Inspection of the knee demonstrated severe degenerative changes with full-thickness loss of articular cartilage. Osteophytes were debrided using a rongeur. Anterior and posterior cruciate ligaments were excised. Two 4.0 mm Schanz pins were inserted in the femur and into the tibia for attachment of the array of trackers used for computer-assisted navigation. Hip center was identified using a circumduction technique. Distal landmarks were mapped using the computer. The distal femur and proximal tibia were mapped using the computer. The distal femoral cutting guide was positioned using computer-assisted navigation so as to achieve a 5 distal valgus cut. The femur was sized and it was felt that a size 6 femoral component was appropriate. A size 6 femoral cutting guide was positioned and the anterior cut was performed and verified using the computer. This was followed by completion of the posterior and chamfer cuts. Femoral cutting guide for the central box was then positioned in the center box cut was performed.  Attention was then directed to the proximal tibia. Medial and lateral menisci were excised. The extramedullary tibial cutting guide was positioned using computer-assisted navigation so as to achieve a 0 varus-valgus alignment and 3 posterior slope. The cut was performed and  verified using the computer. The proximal tibia  was sized and it was felt that a size 6 tibial tray was appropriate. Tibial and femoral trials were inserted followed by insertion of a 5 mm polyethylene insert. This allowed for excellent mediolateral soft tissue balancing both in flexion and in full extension. Finally, the patella was cut and prepared so as to accommodate a 38 mm medialized dome patella. A patella trial was placed and the knee was placed through a range of motion with excellent patellar tracking appreciated. The femoral trial was removed after debridement of posterior osteophytes. The central post-hole for the tibial component was reamed followed by insertion of a keel punch. Tibial trials were then removed. Cut surfaces of bone were irrigated with copious amounts of normal saline with antibiotic solution using pulsatile lavage and then suctioned dry. Polymethylmethacrylate cement was prepared in the usual fashion using a vacuum mixer. Cement was applied to the cut surface of the proximal tibia as well as along the undersurface of a size 6 rotating platform tibial component. Tibial component was positioned and impacted into place. Excess cement was removed using [employer] fast streamer. Cement was then applied to the cut surfaces of the femur as well as along the posterior flanges of the size 6 femoral component. The femoral component was positioned and impacted into place. Excess cement was removed using [employer] fast streamer. A 5 mm polyethylene trial was inserted and the knee was brought into full extension with steady axial compression applied. Finally, cement was applied to the backside of a 38 mm medialized dome patella and the patellar component was positioned and patellar clamp applied. Excess cement was removed using [employer] fast streamer. After adequate curing of the cement, the tourniquet was deflated after a total tourniquet time of 87 minutes. Hemostasis was achieved using electrocautery. The knee was  irrigated with copious amounts of normal saline with antibiotic solution using pulsatile lavage and then suctioned dry. 20 mL of 1.3% Exparel and 60 mL of 0.25% Marcaine in 40 mL of normal saline was injected along the posterior capsule, medial and lateral gutters, and along the arthrotomy site. A 5 mm stabilized rotating platform polyethylene insert was inserted and the knee was placed through a range of motion with excellent mediolateral soft tissue balancing appreciated and excellent patellar tracking noted. 2 medium drains were placed in the wound bed and brought out through separate stab incisions. The medial parapatellar portion of the incision was reapproximated using interrupted sutures of #1 Vicryl. Subcutaneous tissue was approximated in layers using first #0 Vicryl followed #2-0 Vicryl. The skin was approximated with skin staples. A sterile dressing was applied.  The patient tolerated the procedure well and was transported to the recovery room in stable condition.    Claudia Terry., M.D.

## 2017-07-07 NOTE — H&P (Signed)
The patient has been re-examined, and the chart reviewed, and there have been no interval changes to the documented history and physical.    The risks, benefits, and alternatives have been discussed at length. The patient expressed understanding of the risks benefits and agreed with plans for surgical intervention.  James P. Hooten, Jr. M.D.    

## 2017-07-07 NOTE — NC FL2 (Signed)
[place] LEVEL OF CARE SCREENING TOOL     IDENTIFICATION  Patient Name: Claudia Terry Birthdate: 12/13/1945 Sex: female Admission Date (Current Location): 07/07/2017  Assumption and [place] Number:  Engineering geologist and Address:  [facility], [address], [place], [place] [address]      Provider Number: [id number]  Attending Physician Name and Address:  Dereck Leep, MD  Relative Name and Phone Number:       Current Level of Care: Hospital Recommended Level of Care: [place] Prior Approval Number:    Date Approved/Denied:   PASRR Number: ([id number] )  Discharge Plan: SNF    Current Diagnoses: Patient Active Problem List   Diagnosis Date Noted  . S/P total knee arthroplasty 07/07/2017  . Status post total left knee replacement 10/28/2016  . Failed total hip arthroplasty with dislocation ([place]) 12/01/2015  . S/P closed reduction of dislocated total hip prosthesis 08/08/2015  . S/P total hip arthroplasty 06/14/2015  . Benign essential HTN 12/28/2013  . Acid reflux 12/28/2013  . HLD (hyperlipidemia) 12/28/2013  . Restless leg 12/28/2013    Orientation RESPIRATION BLADDER Height & Weight     Self, Time, Situation, Place  O2(1 Liter Oxygen. ) Continent Weight: 182 lb (82.6 kg) Height:  5\' 6"  (167.6 cm)  BEHAVIORAL SYMPTOMS/MOOD NEUROLOGICAL BOWEL NUTRITION STATUS      Continent Diet(Diet: Clear Liquid to be Advanced. )  AMBULATORY STATUS COMMUNICATION OF NEEDS Skin   Extensive Assist Verbally Surgical wounds(Incision: Right Knee. )                       Personal Care Assistance Level of Assistance  Bathing, Feeding, Dressing Bathing Assistance: Limited assistance Feeding assistance: Independent Dressing Assistance: Limited assistance     Functional Limitations Info  Sight, Hearing, Speech Sight Info: Adequate Hearing Info: Adequate Speech Info: Adequate    SPECIAL CARE FACTORS FREQUENCY   PT (By licensed PT), OT (By licensed OT)     PT Frequency: (5) OT Frequency: (5)            Contractures      Additional Factors Info  Code Status, Allergies Code Status Info: (Full Code. ) Allergies Info: (Penicillins, Erythromycin, Tetracyclines & Related)           Current Medications (07/07/2017):  This is the current hospital active medication list Current Facility-Administered Medications  Medication Dose Route Frequency Provider Last Rate Last Dose  . 0.9 %  sodium chloride infusion   Intravenous Continuous Hooten, Laurice Record, MD      . acetaminophen (OFIRMEV) IV 1,000 mg  1,000 mg Intravenous Q6H Hooten, Laurice Record, MD      . Derrill Memo ON 07/08/2017] acetaminophen (TYLENOL) tablet 650 mg  650 mg Oral Q4H PRN Hooten, Laurice Record, MD       Or  . Derrill Memo ON 07/08/2017] acetaminophen (TYLENOL) suppository 650 mg  650 mg Rectal Q4H PRN Hooten, Laurice Record, MD      . alum & mag hydroxide-simeth (MAALOX/MYLANTA) 200-200-20 MG/5ML suspension 30 mL  30 mL Oral Q4H PRN Hooten, Laurice Record, MD      . Derrill Memo ON 07/08/2017] atenolol (TENORMIN) tablet 50 mg  50 mg Oral Daily Hooten, Laurice Record, MD      . bisacodyl (DULCOLAX) suppository 10 mg  10 mg Rectal Daily PRN Hooten, Laurice Record, MD      . Calcium-Phosphorus-Vitamin D [phone] MG-MG-UNIT CHEW 1 tablet  1 tablet Oral  BID Hooten, Laurice Record, MD      . celecoxib (CELEBREX) capsule 200 mg  200 mg Oral Q12H Hooten, Laurice Record, MD      . cholecalciferol (VITAMIN D) tablet 2,000 Units  2,000 Units Oral Daily Hooten, Laurice Record, MD      . clindamycin (CLEOCIN) IVPB 600 mg  600 mg Intravenous Q6H Hooten, Laurice Record, MD      . diphenhydrAMINE (BENADRYL) 12.5 MG/5ML elixir 12.5-25 mg  12.5-25 mg Oral Q4H PRN Hooten, Laurice Record, MD      . Derrill Memo ON 07/08/2017] enoxaparin (LOVENOX) injection 30 mg  30 mg Subcutaneous Q12H Hooten, Laurice Record, MD      . ferrous sulfate tablet 325 mg  325 mg Oral BID WC Hooten, Laurice Record, MD      . gabapentin (NEURONTIN) capsule 300 mg  300 mg Oral QHS  Hooten, Laurice Record, MD      . HAIR SKIN NAILS CAPS 1 capsule  1 capsule Oral BID Hooten, Laurice Record, MD      . magnesium hydroxide (MILK OF MAGNESIA) suspension 30 mL  30 mL Oral Daily PRN Hooten, Laurice Record, MD      . menthol-cetylpyridinium (CEPACOL) lozenge 3 mg  1 lozenge Oral PRN Hooten, Laurice Record, MD       Or  . phenol (CHLORASEPTIC) mouth spray 1 spray  1 spray Mouth/Throat PRN Hooten, Laurice Record, MD      . metoCLOPramide (REGLAN) tablet 10 mg  10 mg Oral TID AC & HS Hooten, Laurice Record, MD      . morphine 2 MG/ML injection 2 mg  2 mg Intravenous Q2H PRN Hooten, Laurice Record, MD      . ondansetron (ZOFRAN) tablet 4 mg  4 mg Oral Q6H PRN Hooten, Laurice Record, MD       Or  . ondansetron (ZOFRAN) injection 4 mg  4 mg Intravenous Q6H PRN Hooten, Laurice Record, MD      . oxyCODONE (Oxy IR/ROXICODONE) immediate release tablet 10 mg  10 mg Oral Q3H PRN Hooten, Laurice Record, MD      . oxyCODONE (Oxy IR/ROXICODONE) immediate release tablet 5 mg  5 mg Oral Q3H PRN Hooten, Laurice Record, MD      . pantoprazole (PROTONIX) EC tablet 40 mg  40 mg Oral BID Hooten, Laurice Record, MD      . pramipexole (MIRAPEX) tablet 1 mg  1 mg Oral QHS Hooten, Laurice Record, MD      . senna-docusate (Senokot-S) tablet 1 tablet  1 tablet Oral BID Hooten, Laurice Record, MD      . sodium phosphate (FLEET) 7-19 GM/118ML enema 1 enema  1 enema Rectal Once PRN Hooten, Laurice Record, MD      . traMADol Veatrice Bourbon) tablet 50-100 mg  50-100 mg Oral Q4H PRN Hooten, Laurice Record, MD         Discharge Medications: Please see discharge summary for a list of discharge medications.  Relevant Imaging Results:  Relevant Lab Results:   Additional Information (SSN: [id number])  Denis Koppel, Veronia Beets, LCSW

## 2017-07-07 NOTE — Evaluation (Signed)
Physical Therapy Evaluation Patient Details Name: Claudia Terry MRN: [id number] DOB: Oct 15, 1945 Today's Date: 07/07/2017   History of Present Illness  Pt s/p R TKA 07/07/17 secondary to degenerative arthrosis.  PMH includes s/p L TKR 10/2016, B THR, htn, LBP, RLS.  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  Pt lives with her husband in 1 level home with multiple steps to enter with railings.  Currently pt is min assist supine to/from sit; good initial sitting balance.  Limited assessment d/t pt c/o "spinning" and seeing "2" of physical therapist sitting edge of bed (pt assisted into laying back down into bed and requiring time to eventually resolve symptom's; nursing notified of pt's symptom's; BP 117/64 laying in bed).  Pt required assist to perform R LE SLR; R knee flexion ROM 60 degrees.  Pt would benefit from skilled PT to address noted impairments and functional limitations (see below for any additional details).  Anticipate pt will be able to discharge home with HHPT and support of family but will need to further assess out of bed mobility for any changes to discharge therapy recommendations next session.    Follow Up Recommendations Home health PT(pending further OOB assessment)    Equipment Recommendations  Rolling walker with 5" wheels    Recommendations for Other Services OT consult     Precautions / Restrictions Precautions Precautions: Fall;Knee Required Braces or Orthoses: Knee Immobilizer - Right Knee Immobilizer - Right: Discontinue once straight leg raise with < 10 degree lag Restrictions Weight Bearing Restrictions: Yes RLE Weight Bearing: Weight bearing as tolerated      Mobility  Bed Mobility Overal bed mobility: Needs Assistance Bed Mobility: Supine to Sit;Sit to Supine     Supine to sit: Min assist;HOB elevated Sit to supine: Min assist;HOB elevated   General bed mobility comments: assist for R LE supine to/from  sit  Transfers                 General transfer comment: Deferred d/t pt c/o "spinning" and seeing "2" of physical therapist sitting edge of bed  Ambulation/Gait             General Gait Details: Deferred d/t pt c/o "spinning" and seeing "2" of physical therapist sitting edge of bed  Stairs            Wheelchair Mobility    Modified Rankin (Stroke Patients Only)       Balance Overall balance assessment: Needs assistance Sitting-balance support: No upper extremity supported;Feet supported Sitting balance-Leahy Scale: Good Sitting balance - Comments: steady sitting reaching within BOS       Standing balance comment: Deferred standing d/t pt c/o "spinning" and seeing "2" of physical therapist sitting edge of bed                             Pertinent Vitals/Pain Pain Assessment: 0-10 Pain Score: 5 (7/10 beginning of session; 5/10 end of session) Pain Location: R knee Pain Descriptors / Indicators: Tender;Sore Pain Intervention(s): Limited activity within patient's tolerance;Monitored during session;Premedicated before session;Repositioned;Ice applied  Vitals (HR and O2 on room air) stable and WFL throughout treatment session.    Home Living Family/patient expects to be discharged to:: Private residence Living Arrangements: Spouse/significant other Available Help at Discharge: Family Type of Home: House Home Access: Stairs to enter Entrance Stairs-Rails: Right;Left;Can reach both Entrance Stairs-Number of Steps: 6 (4 inch steps) Home  Layout: One level Home Equipment: Walker - 2 wheels;Walker - 4 wheels;Cane - single point;Grab bars - tub/shower;Grab bars - toilet;Shower seat      Prior Function Level of Independence: Independent        Comments: Retired Customer service manager        Extremity/Trunk Assessment   Upper Extremity Assessment Upper Extremity Assessment: Overall WFL for tasks assessed    Lower Extremity  Assessment Lower Extremity Assessment: RLE deficits/detail(L LE WFL) RLE Deficits / Details: hip flexion at least 3/5; limited knee flexion/extension ROM d/t pain; requires assist for R LE SLR; DF/PF WFL    Cervical / Trunk Assessment Cervical / Trunk Assessment: Normal  Communication   Communication: No difficulties  Cognition Arousal/Alertness: Awake/alert Behavior During Therapy: WFL for tasks assessed/performed Overall Cognitive Status: Within Functional Limits for tasks assessed                                        General Comments General comments (skin integrity, edema, etc.): R LE dressings, hemovac, polar care, and foley catheter in place.  Nursing cleared pt for participation in physical therapy.  Pt agreeable to PT session.  Pt's husband present during session.    Exercises Total Joint Exercises Ankle Circles/Pumps: AROM;Strengthening;Both;10 reps;Supine Quad Sets: AROM;Strengthening;Right;10 reps;Supine Short Arc Quad: AAROM;Strengthening;Right;10 reps;Supine Heel Slides: AAROM;Strengthening;Right;10 reps;Supine Hip ABduction/ADduction: AAROM;Strengthening;Right;10 reps;Supine Straight Leg Raises: AAROM;Strengthening;Right;10 reps;Supine Goniometric ROM: R knee extension AROM 2 degrees short of neutral and R knee flexion AROM 60 degrees semi supine in bed   Assessment/Plan    PT Assessment Patient needs continued PT services  PT Problem List Decreased strength;Decreased range of motion;Decreased activity tolerance;Decreased balance;Decreased mobility;Decreased knowledge of use of DME;Decreased knowledge of precautions;Pain       PT Treatment Interventions DME instruction;Gait training;Stair training;Functional mobility training;Therapeutic activities;Therapeutic exercise;Balance training;Patient/family education    PT Goals (Current goals can be found in the Care Plan section)  Acute Rehab PT Goals Patient Stated Goal: to improve mobility and have  less pain PT Goal Formulation: With patient Time For Goal Achievement: 07/21/17 Potential to Achieve Goals: Good    Frequency BID   Barriers to discharge        Co-evaluation               AM-PAC PT "6 Clicks" Daily Activity  Outcome Measure Difficulty turning over in bed (including adjusting bedclothes, sheets and blankets)?: A Little Difficulty moving from lying on back to sitting on the side of the bed? : A Little Difficulty sitting down on and standing up from a chair with arms (e.g., wheelchair, bedside commode, etc,.)?: A Little Help needed moving to and from a bed to chair (including a wheelchair)?: A Little Help needed walking in hospital room?: A Little Help needed climbing 3-5 steps with a railing? : A Lot 6 Click Score: 17    End of Session   Activity Tolerance: Other (comment)(Limited d/t "spinning" sensation sitting edge of bed) Patient left: in bed;with call bell/phone within reach;with bed alarm set;with family/visitor present;with SCD's reapplied(R heel elevated via bone foam; L heel elevated via towel roll; polar care in place and activated) Nurse Communication: Mobility status;Precautions;Other (comment)(Pt's c/o "spinning" and seeing double during session) PT Visit Diagnosis: Other abnormalities of gait and mobility (R26.89);Muscle weakness (generalized) (M62.81);Difficulty in walking, not elsewhere classified (R26.2);Pain Pain - Right/Left: Right Pain - part of body: Knee  Time: 1610-9604 PT Time Calculation (min) (ACUTE ONLY): 54 min   Charges:   PT Evaluation $PT Eval Low Complexity: 1 Low PT Treatments $Therapeutic Exercise: 23-37 mins   PT G CodesLeitha Bleak, PT 07/07/17, 5:35 PM [phone]

## 2017-07-07 NOTE — Anesthesia Preprocedure Evaluation (Signed)
Anesthesia Evaluation  Patient identified by MRN, date of birth, ID band Patient awake    Reviewed: Allergy & Precautions, NPO status , Patient's Chart, lab work & pertinent test results  History of Anesthesia Complications Negative for: history of anesthetic complications  Airway Mallampati: II  TM Distance: >3 FB Neck ROM: Full    Dental  (+) Implants   Pulmonary neg pulmonary ROS, neg sleep apnea, neg COPD,    breath sounds clear to auscultation- rhonchi (-) wheezing      Cardiovascular hypertension, Pt. 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 Tiffany Kocher No date: Hypertension 11/95: Low back pain No date: Menopause 1999: Restless leg syndrome   Reproductive/Obstetrics                             Anesthesia Physical Anesthesia Plan  ASA: II  Anesthesia Plan: General   Post-op Pain Management:    Induction: Intravenous  PONV Risk Score and Plan: 2 and Dexamethasone and Ondansetron  Airway Management Planned: Oral ETT  Additional Equipment:   Intra-op Plan:   Post-operative Plan: Extubation in OR  Informed Consent: I have reviewed the patients History and Physical, chart, labs and  discussed the procedure including the risks, benefits and alternatives for the proposed anesthesia with the patient or authorized representative who has indicated his/her understanding and acceptance.   Dental advisory given  Plan Discussed with: CRNA and Anesthesiologist  Anesthesia Plan Comments:         Anesthesia Quick Evaluation

## 2017-07-07 NOTE — Anesthesia Postprocedure Evaluation (Signed)
Anesthesia Post Note  Patient: Claudia Terry  Procedure(s) Performed: COMPUTER ASSISTED TOTAL KNEE ARTHROPLASTY (Right )  Patient location during evaluation: PACU Anesthesia Type: General Level of consciousness: awake and alert and oriented Pain management: pain level controlled Vital Signs Assessment: post-procedure vital signs reviewed and stable Respiratory status: spontaneous breathing, nonlabored ventilation and respiratory function stable Cardiovascular status: blood pressure returned to baseline and stable Postop Assessment: no signs of nausea or vomiting Anesthetic complications: no     Last Vitals:  Vitals:   07/07/17 1408 07/07/17 1419  BP:  101/62  Pulse:  71  Resp:  16  Temp:  (!) 36.3 C  SpO2: 98% 98%    Last Pain:  Vitals:   07/07/17 1419  TempSrc:   PainSc: 3                  Kalsey Lull

## 2017-07-08 MED ORDER — K PHOS MONO-SOD PHOS DI & MONO 155-852-130 MG PO TABS
250.0000 mg | ORAL_TABLET | Freq: Two times a day (BID) | ORAL | Status: DC
  Administered 2017-07-08 – 2017-07-09 (×3): 250 mg via ORAL
  Filled 2017-07-08 (×4): qty 1

## 2017-07-08 MED ORDER — OXYCODONE HCL 5 MG PO TABS: 5.0000 mg | ORAL_TABLET | ORAL | 0 refills | Status: DC | PRN

## 2017-07-08 MED ORDER — METHOCARBAMOL 500 MG PO TABS
500.0000 mg | ORAL_TABLET | Freq: Two times a day (BID) | ORAL | Status: DC | PRN
  Administered 2017-07-08 – 2017-07-09 (×2): 500 mg via ORAL
  Filled 2017-07-08 (×2): qty 1

## 2017-07-08 MED ORDER — ADULT MULTIVITAMIN W/MINERALS CH
1.0000 | ORAL_TABLET | Freq: Every day | ORAL | Status: DC
  Administered 2017-07-08 – 2017-07-09 (×2): 1 via ORAL
  Filled 2017-07-08 (×2): qty 1

## 2017-07-08 MED ORDER — CALCIUM CITRATE-VITAMIN D 500-500 MG-UNIT PO CHEW
1.0000 | CHEWABLE_TABLET | Freq: Two times a day (BID) | ORAL | Status: DC
  Administered 2017-07-08 – 2017-07-09 (×3): 1 via ORAL
  Filled 2017-07-08 (×4): qty 1

## 2017-07-08 MED ORDER — TRAMADOL HCL 50 MG PO TABS: 50.0000 mg | ORAL_TABLET | ORAL | 0 refills | Status: DC | PRN

## 2017-07-08 MED ORDER — ENOXAPARIN SODIUM 40 MG/0.4ML ~~LOC~~ SOLN: 40.0000 mg | SUBCUTANEOUS | 0 refills | Status: DC

## 2017-07-08 NOTE — Progress Notes (Addendum)
Patient ordered hair and nail vitamin and calcicum/phosphorus/vitamin D product. Products transitioned to similar formulary agents and not listed for prescription at discharge.   Currie Paris, PharmD  01.29.19 [phone]

## 2017-07-08 NOTE — Progress Notes (Signed)
Clinical Social Worker (CSW) received SNF consult. PT is recommending home health. RN case manager aware of above. Please reconsult if future social work needs arise. CSW signing off.   Rayla Pember, LCSW [phone] 

## 2017-07-08 NOTE — Discharge Summary (Signed)
Physician Discharge Summary  Patient ID: Claudia Terry MRN: [id number] DOB/AGE: 1946/03/11 72 y.o.  Admit date: 07/07/2017 Discharge date: 07/09/2017  Admission Diagnoses:  PRIMARY OSTEOARTHRITIS OF RIGHT KNEE   Discharge Diagnoses: Patient Active Problem List   Diagnosis Date Noted  . S/P total knee arthroplasty 07/07/2017  . Status post total left knee replacement 10/28/2016  . Failed total hip arthroplasty with dislocation ([place]) 12/01/2015  . S/P closed reduction of dislocated total hip prosthesis 08/08/2015  . S/P total hip arthroplasty 06/14/2015  . Benign essential HTN 12/28/2013  . Acid reflux 12/28/2013  . HLD (hyperlipidemia) 12/28/2013  . Restless leg 12/28/2013    Past Medical History:  Diagnosis Date  . Arthritis   . 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 GERD (gastroesophageal reflux disease) 8/01    Dr. Tiffany Kocher  . Hypertension   . Low back pain 11/95  . Menopause   . Restless leg syndrome 1999     Transfusion: no transfusion during this admission   Consultants (if any):   Discharged Condition: Improved  Hospital Course: Claudia Terry is an 72 y.o. female who was admitted 07/07/2017 with a diagnosis of degenerative arthrosis of right knee and went to the operating room on 07/07/2017 and underwent the above named procedures.    Surgeries:Procedure(s): COMPUTER ASSISTED TOTAL KNEE ARTHROPLASTY on 07/07/2017  PRE-OPERATIVE DIAGNOSIS: Degenerative arthrosis of the right knee, primary  POST-OPERATIVE DIAGNOSIS:  Same  PROCEDURE:  Right total knee arthroplasty using computer-assisted navigation  SURGEON:  Marciano Sequin. M.D.  ASSISTANT:  Vance Peper, PA (present and scrubbed throughout the case, critical for assistance with exposure, retraction, instrumentation, and closure)  ANESTHESIA: general  ESTIMATED BLOOD LOSS: 100 mL  FLUIDS REPLACED: 1600 mL of crystalloid  TOURNIQUET TIME: 87  minutes  DRAINS: 2 medium Hemovac drains  SOFT TISSUE RELEASES: Anterior cruciate ligament, posterior cruciate ligament, deep medial collateral ligament, patellofemoral ligament  IMPLANTS UTILIZED: DePuy Attune size 6 posterior stabilized femoral component (cemented), size 6 rotating platform tibial component (cemented), 38 mm medialized dome patella (cemented), and a 5 mm stabilized rotating platform polyethylene insert.  INDICATIONS FOR SURGERY: Claudia Terry is a 72 y.o. year old female with a long history of progressive knee pain. X-rays demonstrated severe degenerative changes in tricompartmental fashion. The patient had not seen any significant improvement despite conservative nonsurgical intervention. After discussion of the risks and benefits of surgical intervention, the patient expressed understanding of the risks benefits and agree with plans for total knee arthroplasty.   The risks, benefits, and alternatives were discussed at length including but not limited to the risks of infection, bleeding, nerve injury, stiffness, blood clots, the need for revision surgery, cardiopulmonary complications, among others, and they were willing to proceed.   Patient tolerated the surgery well. No complications .Patient was taken to PACU where she was stabilized and then transferred to the orthopedic floor.  Patient started on Lovenox 30 q 12 hrs. Foot pumps applied bilaterally at 80 mm hgb. Heels elevated off bed with rolled towels. No evidence of DVT. Calves non tender. Negative Homan. Physical therapy started on day #1 for gait training and transfer with OT starting on  day #1 for ADL and assisted devices. Patient has done well with therapy. Ambulated greater than 200 feet upon being discharged. Was able to ascend and descend 4 steps safely and independently  Patient's IV And Foley were discontinued on day #1 with Hemovac being  discontinued on day #2. Dressing was changed on day 2 prior to  patient being discharged   She was given perioperative antibiotics:  Anti-infectives (From admission, onward)   Start     Dose/Rate Route Frequency Ordered Stop   07/07/17 1600  clindamycin (CLEOCIN) IVPB 600 mg     600 mg 100 mL/hr over 30 Minutes Intravenous Every 6 hours 07/07/17 1439 07/08/17 1559   07/07/17 0600  clindamycin (CLEOCIN) IVPB 900 mg  Status:  Discontinued     900 mg 100 mL/hr over 30 Minutes Intravenous On call to O.R. 07/06/17 2336 07/07/17 2831    .  She was fitted with AV 1 compression foot pump devices, instructed on heel pumps, early ambulation, and fitted with TED stockings bilaterally for DVT prophylaxis.  She benefited maximally from the hospital stay and there were no complications.    Recent vital signs:  Vitals:   07/08/17 0113 07/08/17 0541  BP: 104/65 118/65  Pulse: 78 70  Resp: 18   Temp: 98.4 F (36.9 C) 98.2 F (36.8 C)  SpO2: 90% 97%    Recent laboratory studies:  Lab Results  Component Value Date   HGB 13.9 06/27/2017   HGB 11.6 (L) 10/30/2016   HGB 12.0 10/29/2016   Lab Results  Component Value Date   WBC 6.7 06/27/2017   PLT 218 06/27/2017   Lab Results  Component Value Date   INR 0.98 06/27/2017   Lab Results  Component Value Date   NA 139 06/27/2017   K 4.0 06/27/2017   CL 104 06/27/2017   CO2 27 06/27/2017   BUN 16 06/27/2017   CREATININE 0.56 06/27/2017   GLUCOSE 100 (H) 06/27/2017    Discharge Medications:   Allergies as of 07/08/2017      Reactions   Penicillins Hives, Other (See Comments)   Has patient had a PCN reaction causing immediate rash, facial/tongue/throat swelling, SOB or lightheadedness with hypotension: No Has patient had a PCN reaction causing severe rash involving mucus membranes or skin necrosis: No Has patient had a PCN reaction that required hospitalization No Has patient had a PCN reaction occurring within the last 10 years: No If all of the above answers are "NO", then may proceed with  Cephalosporin use.   Erythromycin Rash   Tetracyclines & Related Rash      Medication List    TAKE these medications   acetaminophen 500 MG tablet Commonly known as:  TYLENOL Take 1,000 mg by mouth every 6 (six) hours as needed (for knee pain.).   atenolol 50 MG tablet Commonly known as:  TENORMIN Take 50 mg by mouth daily.   Biotin 5000 MCG Tabs Take 1 capsule by mouth daily.   CITRACAL +D3 PO Take 1 tablet by mouth 2 (two) times daily.   enoxaparin 40 MG/0.4ML injection Commonly known as:  LOVENOX Inject 0.4 mLs (40 mg total) into the skin daily for 14 days. Start taking on:  07/10/2017   HAIR SKIN NAILS PO Take 1 tablet by mouth 2 (two) times daily.   Melatonin 10 MG Tabs Take 10 mg by mouth at bedtime.   MIRAPEX 1 MG tablet Generic drug:  pramipexole Take 1 mg by mouth at bedtime.   omeprazole 20 MG capsule Commonly known as:  PRILOSEC Take 20 mg by mouth daily.   OVER THE COUNTER MEDICATION Take 1 capsule by mouth daily. Arthrozene   OVER THE COUNTER MEDICATION Take 1 capsule by mouth daily. Inner defense (essential oil)  oxyCODONE 5 MG immediate release tablet Commonly known as:  Oxy IR/ROXICODONE Take 1 tablet (5 mg total) by mouth every 3 (three) hours as needed for moderate pain ((score 4 to 6)).   Red Yeast Rice 600 MG Tabs Take 600 mg by mouth 2 (two) times daily.   traMADol 50 MG tablet Commonly known as:  ULTRAM Take 1-2 tablets (50-100 mg total) by mouth every 4 (four) hours as needed for moderate pain.   Vitamin D3 2000 units capsule Take 2,000 Units by mouth daily.            Durable Medical Equipment  (From admission, onward)        Start     Ordered   07/07/17 1440  DME Walker rolling  Once    Question:  Patient needs a walker to treat with the following condition  Answer:  Total knee replacement status   07/07/17 1439   07/07/17 1440  DME Bedside commode  Once    Question:  Patient needs a bedside commode to treat with the  following condition  Answer:  Total knee replacement status   07/07/17 1439      Diagnostic Studies: Dg Knee Right Port  Result Date: 07/07/2017 CLINICAL DATA:  Status post right total knee joint prosthesis placement. EXAM: PORTABLE RIGHT KNEE - 1-2 VIEW COMPARISON:  MRI of the right knee of May 17, 2014 FINDINGS: The patient has undergone total right knee joint prosthesis placement. Radiographic positioning of the prosthetic components is good. The interface with the native bone appears normal. Surgical drain lines and skin staples are present. IMPRESSION: There is no immediate postprocedure complication following right total knee joint prosthesis placement. Electronically Signed   By: David  Martinique M.D.   On: 07/07/2017 14:05    Disposition: 06-Home-Health Care Svc  Discharge Instructions    Increase activity slowly   Complete by:  As directed       Follow-up Information    Watt Climes, PA On 07/22/2017.   Specialty:  Physician Assistant Why:  at 1:15pm Contact information: [place] [address] [phone]        Dereck Leep, MD On 08/19/2017.   Specialty:  Orthopedic Surgery Why:  at 10:45am Contact information: [place] [address] [phone]            Signed: Watt Climes 07/08/2017, 7:55 AM

## 2017-07-08 NOTE — Progress Notes (Signed)
Patient is A+O with no signs of distress.  Appears to have slept well and pain controlled with current medications.  Tolerated bone foam well.  Foley will d/c this am

## 2017-07-08 NOTE — Progress Notes (Signed)
Physical Therapy Treatment Patient Details Name: Claudia Terry MRN: [id number] DOB: 03-25-1946 Today's Date: 07/08/2017    History of Present Illness Pt s/p R TKA 07/07/17 secondary to degenerative arthrosis.  PMH includes s/p L TKR 10/2016, B THR, htn, LBP, RLS.    PT Comments    Pt able to progress to ambulating 120 feet with RW CGA.  Pt steady with transfers and ambulation using RW.  Pain 5-6/10 R knee beginning of session and 7/10 end of session (nursing notified and brought pain meds towards end of session).  Will continue to focus on strengthening, R knee ROM, and increasing ambulation distance per pt tolerance; also need to trial stairs next session.    Follow Up Recommendations  Home health PT     Equipment Recommendations  Rolling walker with 5" wheels    Recommendations for Other Services OT consult     Precautions / Restrictions Precautions Precautions: Fall;Knee Precaution Booklet Issued: Yes (comment) Required Braces or Orthoses: Knee Immobilizer - Right Knee Immobilizer - Right: Discontinue once straight leg raise with < 10 degree lag Restrictions Weight Bearing Restrictions: Yes RLE Weight Bearing: Weight bearing as tolerated    Mobility  Bed Mobility Overal bed mobility: Needs Assistance Bed Mobility: Sit to Supine     Sit to supine: Min assist;HOB elevated   General bed mobility comments: assist for R LE sit to supine; able to scoot up in bed on her own  Transfers Overall transfer level: Needs assistance Equipment used: Rolling walker (2 wheeled) Transfers: Sit to/from Stand Sit to Stand: Min guard       General transfer comment: occasional vc's for UE and LE placement/positioning required  Ambulation/Gait Ambulation/Gait assistance: Min guard Ambulation Distance (Feet): 120 Feet Assistive device: Rolling walker (2 wheeled)   Gait velocity: decreased   General Gait Details: partial step through gait pattern; mild decreased stance time R LE;  no knee buckling noted; occasional vc's to increase UE support through RW; L>R toe in noted   Stairs            Wheelchair Mobility    Modified Rankin (Stroke Patients Only)       Balance Overall balance assessment: Needs assistance Sitting-balance support: No upper extremity supported;Feet supported Sitting balance-Leahy Scale: Normal Sitting balance - Comments: steady sitting reaching outside BOS   Standing balance support: No upper extremity supported Standing balance-Leahy Scale: Fair Standing balance comment: able to perform static standing no UE support (increased WB'ing noted through L LE)                            Cognition Arousal/Alertness: Awake/alert Behavior During Therapy: WFL for tasks assessed/performed Overall Cognitive Status: Within Functional Limits for tasks assessed                                        Exercises Total Joint Exercises Long Arc Quad: AROM;Strengthening;Right;10 reps;Seated Knee Flexion: AROM;Strengthening;Right;Seated(x3 reps with 15 second holds at comfortable end range each rep) Goniometric ROM (performed in AM session): R knee extension AROM 2 degrees short of neutral semi-supine in bed; R knee flexion AROM 85 degrees sitting edge of chair    General Comments General comments (skin integrity, edema, etc.): R LE dressings, hemovac, and polar care in place.  Pt agreeable to PT session.  Pt's husband present during session.  Pertinent Vitals/Pain Pain Assessment: 0-10 Pain Score: 7 (5-6/10 beginning of session; 7/10 end of session) Pain Location: R knee Pain Descriptors / Indicators: Tender;Sore Pain Intervention(s): Limited activity within patient's tolerance;Monitored during session;Repositioned;Patient requesting pain meds-RN notified;RN gave pain meds during session;Ice applied  Vitals (HR and O2 on room air) stable and WFL throughout treatment session.    Home Living                       Prior Function            PT Goals (current goals can now be found in the care plan section) Acute Rehab PT Goals Patient Stated Goal: to improve mobility and have less pain PT Goal Formulation: With patient Time For Goal Achievement: 07/21/17 Potential to Achieve Goals: Good Progress towards PT goals: Progressing toward goals    Frequency    BID      PT Plan Current plan remains appropriate    Co-evaluation              AM-PAC PT "6 Clicks" Daily Activity  Outcome Measure  Difficulty turning over in bed (including adjusting bedclothes, sheets and blankets)?: A Little Difficulty moving from lying on back to sitting on the side of the bed? : A Little Difficulty sitting down on and standing up from a chair with arms (e.g., wheelchair, bedside commode, etc,.)?: A Little Help needed moving to and from a bed to chair (including a wheelchair)?: A Little Help needed walking in hospital room?: A Little Help needed climbing 3-5 steps with a railing? : A Little 6 Click Score: 18    End of Session Equipment Utilized During Treatment: Gait belt Activity Tolerance: Patient tolerated treatment well Patient left: in bed;with call bell/phone within reach;with bed alarm set;with family/visitor present;with SCD's reapplied(R heel elevated via bone foam; L heel elevated via towel roll; polar care in place and activated) Nurse Communication: Mobility status;Precautions;Weight bearing status;Patient requests pain meds PT Visit Diagnosis: Other abnormalities of gait and mobility (R26.89);Muscle weakness (generalized) (M62.81);Difficulty in walking, not elsewhere classified (R26.2);Pain Pain - Right/Left: Right Pain - part of body: Knee     Time: 1335-1400 PT Time Calculation (min) (ACUTE ONLY): 25 min  Charges:  $Gait Training: 8-22 mins $Therapeutic Exercise: 8-22 mins                    G CodesLeitha Bleak, PT 07/08/17, 2:16 PM [phone]

## 2017-07-08 NOTE — Plan of Care (Signed)
  Education: Knowledge of General Education information will improve 07/08/2017 0559 - Progressing by Mong Neal, Lucille Passy, RN   Health Behavior/Discharge Planning: Ability to manage health-related needs will improve 07/08/2017 0559 - Progressing by Caelen Reierson, Lucille Passy, RN   Clinical Measurements: Ability to maintain clinical measurements within normal limits will improve 07/08/2017 0559 - Progressing by Ishmail Mcmanamon, Lucille Passy, RN Will remain free from infection 07/08/2017 0559 - Progressing by Surafel Hilleary, Lucille Passy, RN Diagnostic test results will improve 07/08/2017 0559 - Progressing by Kemar Pandit, Lucille Passy, RN Respiratory complications will improve 07/08/2017 0559 - Progressing by Bryna Colander, RN Cardiovascular complication will be avoided 07/08/2017 0559 - Progressing by Margy Sumler, Lucille Passy, RN   Activity: Risk for activity intolerance will decrease 07/08/2017 0559 - Progressing by Bryna Colander, RN   Nutrition: Adequate nutrition will be maintained 07/08/2017 0559 - Progressing by Bryna Colander, RN   Coping: Level of anxiety will decrease 07/08/2017 0559 - Progressing by Bryna Colander, RN   Elimination: Will not experience complications related to bowel motility 07/08/2017 0559 - Progressing by Williemae Muriel, Lucille Passy, RN Will not experience complications related to urinary retention 07/08/2017 0559 - Progressing by Kaileen Bronkema, Lucille Passy, RN   Pain Managment: General experience of comfort will improve 07/08/2017 0559 - Progressing by Azariya Freeman, Lucille Passy, RN   Safety: Ability to remain free from injury will improve 07/08/2017 0559 - Progressing by Dimetri Armitage, Lucille Passy, RN

## 2017-07-08 NOTE — Progress Notes (Signed)
Patient complaining of cramping. No orders for muscle relaxant in place. PA [facility] paged and received verbal orders for Robaxin 500 mg PO BID PRN. Order placed.

## 2017-07-08 NOTE — Progress Notes (Signed)
Physical Therapy Treatment Patient Details Name: Claudia Terry MRN: [id number] DOB: [birth year] Today's Date: 07/08/2017    History of Present Illness Pt s/p R TKA 07/07/17 secondary to degenerative arthrosis.  PMH includes s/p L TKR 10/2016, B THR, htn, LBP, RLS.    PT Comments    Pt tolerated session's activities fairly well today (no c/o "spinning" or seeing "double").  Able to progress to ambulating 60 feet with RW CGA (requires some vc's for gait technique).  Pt demonstrating steady transfers with RW (requires some vc's for positioning).  Pain 4-5/10 R knee during and end of session.  Will continue to progress pt with strengthening, ROM, and increasing ambulation distance during therapy sessions.   Follow Up Recommendations  Home health PT     Equipment Recommendations  Rolling walker with 5" wheels    Recommendations for Other Services OT consult     Precautions / Restrictions Precautions Precautions: Fall;Knee Precaution Booklet Issued: Yes (comment) Required Braces or Orthoses: Knee Immobilizer - Right Knee Immobilizer - Right: Discontinue once straight leg raise with < 10 degree lag Restrictions Weight Bearing Restrictions: Yes RLE Weight Bearing: Weight bearing as tolerated    Mobility  Bed Mobility Overal bed mobility: Needs Assistance Bed Mobility: Supine to Sit     Supine to sit: Supervision;HOB elevated     General bed mobility comments: mild increased effort to perform on own but no physical assist required  Transfers Overall transfer level: Needs assistance Equipment used: Rolling walker (2 wheeled) Transfers: Sit to/from [facility] Sit to Stand: Min guard Stand pivot transfers: Min guard(stand step turn from bed to [facility] to recliner)       General transfer comment: sit to/from stand from bed, BSC, and recliner pt requiring vc's for UE and LE placement and use of walker  Ambulation/Gait Ambulation/Gait assistance: Min  guard Ambulation Distance (Feet): 60 Feet Assistive device: Rolling walker (2 wheeled)   Gait velocity: decreased   General Gait Details: step to gait pattern; decreased stance time R LE; no knee buckling noted; initial vc's to increase UE support through RW; L>R toe in noted   Stairs            Wheelchair Mobility    Modified Rankin (Stroke Patients Only)       Balance Overall balance assessment: Needs assistance Sitting-balance support: No upper extremity supported;Feet supported Sitting balance-Leahy Scale: Good Sitting balance - Comments: steady sitting reaching within BOS   Standing balance support: Single extremity supported Standing balance-Leahy Scale: Poor Standing balance comment: required at least single UE support standing performing toileting hygiene                            Cognition Arousal/Alertness: Awake/alert Behavior During Therapy: WFL for tasks assessed/performed Overall Cognitive Status: Within Functional Limits for tasks assessed                                        Exercises Total Joint Exercises Ankle Circles/Pumps: AROM;Strengthening;Both;10 reps;Supine Quad Sets: AROM;Strengthening;Both;10 reps;Supine Short Arc Quad: AROM;Strengthening;Right;Supine(5 reps x2) Heel Slides: AROM;Strengthening;Right;Supine(5 reps x2) Hip ABduction/ADduction: AROM;Strengthening;Right;Supine(5 reps x2) Straight Leg Raises: AROM;Strengthening;Right;Supine(5 reps x2) Goniometric ROM: R knee extension AROM 2 degrees short of neutral semi-supine in bed; R knee flexion AROM 85 degrees sitting edge of chair    General Comments General comments (skin integrity, edema, etc.): R  LE dressings, hemovac, and polar care in place.  Pt agreeable to PT session.      Pertinent Vitals/Pain Pain Assessment: 0-10 Pain Score: 5  Pain Descriptors / Indicators: Tender;Sore Pain Intervention(s): Limited activity within patient's  tolerance;Monitored during session;Premedicated before session;Repositioned;Ice applied  Vitals stable and WFL throughout treatment session.  BP 122/68 supine; 127/70 sitting; and 134/70 end of session sitting in chair.    Home Living                      Prior Function            PT Goals (current goals can now be found in the care plan section) Acute Rehab PT Goals Patient Stated Goal: to improve mobility and have less pain PT Goal Formulation: With patient Time For Goal Achievement: 07/21/17 Potential to Achieve Goals: Good Progress towards PT goals: Progressing toward goals    Frequency    BID      PT Plan Current plan remains appropriate    Co-evaluation              AM-PAC PT "6 Clicks" Daily Activity  Outcome Measure  Difficulty turning over in bed (including adjusting bedclothes, sheets and blankets)?: A Little Difficulty moving from lying on back to sitting on the side of the bed? : A Little Difficulty sitting down on and standing up from a chair with arms (e.g., wheelchair, bedside commode, etc,.)?: A Little Help needed moving to and from a bed to chair (including a wheelchair)?: A Little Help needed walking in hospital room?: A Little Help needed climbing 3-5 steps with a railing? : A Little 6 Click Score: 18    End of Session Equipment Utilized During Treatment: Gait belt Activity Tolerance: Patient tolerated treatment well Patient left: in chair;with call bell/phone within reach;with chair alarm set;with SCD's reapplied(B heels elevated via towel rolls; polar care in place and activated) Nurse Communication: Mobility status;Precautions;Weight bearing status PT Visit Diagnosis: Other abnormalities of gait and mobility (R26.89);Muscle weakness (generalized) (M62.81);Difficulty in walking, not elsewhere classified (R26.2);Pain Pain - Right/Left: Right Pain - part of body: Knee     Time: 2671-2458 PT Time Calculation (min) (ACUTE ONLY): 53  min  Charges:  $Gait Training: 8-22 mins $Therapeutic Exercise: 23-37 mins $Therapeutic Activity: 8-22 mins                    G CodesLeitha Bleak, PT 07/08/17, 12:33 PM [phone]

## 2017-07-08 NOTE — Progress Notes (Signed)
OT Screen Note  Patient Details Name: Claudia Terry MRN: [id number] DOB: 03-Aug-1945   Cancelled Treatment:    Reason Eval/Treat Not Completed: Patient declined, no reason specified;OT screened, no needs identified, will sign off. Order received, chart reviewed. 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 Pt with good supportive home set up with all AE/DME needs met. Pt verbalizes confidence with self care tasks and politely declines additional OT services at this time. Fully anticipate good recovery with HHPT services after hospitalization. No additional OT needs at this time. Will sign off. Please re-consult if additional OT needs arise.   Jeni Salles, MPH, MS, OTR/L ascom [phone] 07/08/17, 11:13 AM

## 2017-07-08 NOTE — Progress Notes (Signed)
ORTHOPAEDICS PROGRESS NOTE  PATIENT NAME: Claudia Terry DOB: 26-Mar-1946  MRN: [id number]  POD # 1: Right total knee arthroplasty  Subjective: The patient rested comfortably last night.  Her husband stayed with her. She sat at the bedside and dangle as per nursing.  Pain has been under good control.  Objective: Vital signs in last 24 hours: Temp:  [97.4 F (36.3 C)-98.7 F (37.1 C)] 98.2 F (36.8 C) (01/29 0541) Pulse Rate:  [69-80] 70 (01/29 0541) Resp:  [13-18] 18 (01/29 0113) BP: (101-138)/(58-88) 118/65 (01/29 0541) SpO2:  [90 %-98 %] 97 % (01/29 0541) Weight:  [82.6 kg (182 lb)] 82.6 kg (182 lb) (01/28 0948)  Intake/Output from previous day: 01/28 0701 - 01/29 0700 In: 4883.3 [P.O.:1480; I.V.:2953.3; IV Piggyback:450] Out: 2640 [Urine:2210; Drains:330; Blood:100]  No results for input(s): WBC, HGB, HCT, PLT, K, CL, CO2, BUN, CREATININE, GLUCOSE, CALCIUM, LABPT, INR in the last 72 hours.  EXAM General: Well-developed well-nourished female seen in no apparent discomfort. Lungs: clear to auscultation Cardiac: normal rate and regular rhythm Right lower extremity: Dressing is dry and intact.  Bone foam is in place.  Polar Care is in place and functioning.  Homans test is negative.  The patient is able to perform straight leg raise with minimal assist. Neurologic: Sensory and motor function are grossly intact.   Assessment: Right total knee arthroplasty  Secondary diagnoses: Restless leg syndrome Hypertension Gastroesophageal reflux disease Status post left total knee arthroplasty  Plan: Today's goal were reviewed with the patient.  Continue with total knee arthroplasty rehab protocol. Plan is to go Home after hospital stay. DVT Prophylaxis - Lovenox, Foot Pumps and TED hose  James P. Holley Bouche M.D.

## 2017-07-08 NOTE — Care Management Note (Signed)
Case Management Note  Patient Details  Name: Claudia Terry MRN: [id number] Date of Birth: 30-Jul-1945  Subjective/Objective: POD # 1 right Total knee arthroplasty. Met with patient at bedside to discuss discharge planning. Patient lives at home with her spouse who will be her caregiver. Offered choice of home health agencies and she prefers [facility]. Referral to Sonia Side with [facility] for [place]. She has a walker, cane and shower chair. Pharmacy: [facility]: [address]; [phone]. Will check price of Lovenox prior to discharge.                      Action/Plan: [facility] for HHPT  Expected Discharge Date:                  Expected Discharge Plan:  [place]  In-House Referral:     Discharge planning Services  CM Consult  Post Acute Care Choice:  Home Health Choice offered to:  Patient  DME Arranged:    DME Agency:     HH Arranged:  PT [place]:  [facility] (formerly [facility])  Status of Service:  In process, will continue to follow  If discussed at Long Length of Stay Meetings, dates discussed:    Additional Comments:  Jolly Mango, RN 07/08/2017, 3:16 PM

## 2017-07-09 NOTE — Progress Notes (Signed)
Patient A+O with no signs of distress. Complaints of muscle spasms butcontrolled with current medications.  BM today and up to [facility] with ease.

## 2017-07-09 NOTE — Progress Notes (Signed)
   Subjective: 2 Days Post-Op Procedure(s) (LRB): COMPUTER ASSISTED TOTAL KNEE ARTHROPLASTY (Right) Patient reports pain as 4 on 0-10 scale.   Patient is well, and has had no acute complaints or problems Pt did well with therapy yesterday. rom 0-85 and ambulated 120 ft Plan is to go Home after hospital stay. no nausea and no vomiting Patient denies any chest pains or shortness of breath. Objective: Vital signs in last 24 hours: Temp:  [98 F (36.7 C)-98.4 F (36.9 C)] 98.2 F (36.8 C) (01/30 0412) Pulse Rate:  [66-88] 78 (01/30 0412) Resp:  [18-20] 18 (01/30 0412) BP: (118-136)/(63-76) 118/64 (01/30 0412) SpO2:  [93 %-98 %] 97 % (01/30 0412) well approximated incision Heels are non tender and elevated off the bed using rolled towels Intake/Output from previous day: 01/29 0701 - 01/30 0700 In: 2303.3 [P.O.:1320; I.V.:883.3; IV Piggyback:100] Out: 843 [Urine:752; Drains:90; Stool:1] Intake/Output this shift: Total I/O In: 480 [P.O.:480] Out: 91 [Drains:90; Stool:1]  No results for input(s): HGB in the last 72 hours. No results for input(s): WBC, RBC, HCT, PLT in the last 72 hours. No results for input(s): NA, K, CL, CO2, BUN, CREATININE, GLUCOSE, CALCIUM in the last 72 hours. No results for input(s): LABPT, INR in the last 72 hours.  EXAM General - Patient is Alert, Appropriate and Oriented Extremity - Neurologically intact Neurovascular intact Sensation intact distally Intact pulses distally Dorsiflexion/Plantar flexion intact No cellulitis present Compartment soft Dressing - dressing C/D/I Motor Function - intact, moving foot and toes well on exam.    Past Medical History:  Diagnosis Date  . Arthritis   . 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 GERD (gastroesophageal reflux disease) 8/01    Dr. Tiffany Kocher  . Hypertension   . Low back pain 11/95  . Menopause   . Restless leg syndrome 1999    Assessment/Plan: 2 Days Post-Op  Procedure(s) (LRB): COMPUTER ASSISTED TOTAL KNEE ARTHROPLASTY (Right) Active Problems:   S/P total knee arthroplasty  Estimated body mass index is 29.38 kg/m as calculated from the following:   Height as of this encounter: 5\' 6"  (1.676 m).   Weight as of this encounter: 82.6 kg (182 lb). Up with therapy Discharge home with home health  Labs: none DVT Prophylaxis - Lovenox, Foot Pumps and TED hose Weight-Bearing as tolerated to right leg Hemovac d/c'd. End of drains appeared to be intact Please was the operative leg and apply new dressing as well as TED stocking to both legs  Claudia Terry R. [place] 07/09/2017, 6:50 AM

## 2017-07-09 NOTE — Progress Notes (Signed)
Physical Therapy Treatment Patient Details Name: Claudia Terry MRN: [id number] DOB: 18-Aug-1945 Today's Date: 07/09/2017    History of Present Illness Pt s/p R TKA 07/07/17 secondary to degenerative arthrosis.  PMH includes s/p L TKR 10/2016, B THR, htn, LBP, RLS.    PT Comments    Pt able to ambulate around nursing loop with RW modified independently and navigate 8 stairs with B railings with SBA safely.  Pain 5/10 R knee beginning of session and 6/10 end of session; nursing notified regarding pt's request for pain meds.  R knee ROM 0-102 degrees.  Pt appears safe to discharge home with HHPT and support of family (plan for pt to discharge home today).   Follow Up Recommendations  Home health PT     Equipment Recommendations  Rolling walker with 5" wheels    Recommendations for Other Services       Precautions / Restrictions Precautions Precautions: Fall;Knee Precaution Booklet Issued: Yes (comment) Required Braces or Orthoses: Knee Immobilizer - Right Knee Immobilizer - Right: Discontinue once straight leg raise with < 10 degree lag Restrictions Weight Bearing Restrictions: Yes RLE Weight Bearing: Weight bearing as tolerated    Mobility  Bed Mobility Overal bed mobility: Modified Independent Bed Mobility: Supine to Sit;Sit to Supine     Supine to sit: Modified independent (Device/Increase time) Sit to supine: Modified independent (Device/Increase time)   General bed mobility comments: mild increased effort to perform on own but no physical assist required  Transfers Overall transfer level: Modified independent Equipment used: Rolling walker (2 wheeled) Transfers: Sit to/from Stand Sit to Stand: Modified independent (Device/Increase time) Stand pivot transfers: Modified independent (Device/Increase time)       General transfer comment: mild increased effort to stand from bed, recliner, and toilet but no physical assist required  Ambulation/Gait Ambulation/Gait  assistance: Modified independent (Device/Increase time) Ambulation Distance (Feet): (240 feet; 50 feet; 25 feet x2 (to bathroom and back)) Assistive device: Rolling walker (2 wheeled)   Gait velocity: mildly decreased   General Gait Details: partial step through gait pattern; mild decreased stance time R LE; no knee buckling noted; L>R toe in noted; steady   Stairs Stairs: Yes  Assist: SBA Stair Management: Two rails;Step to pattern;Forwards Number of Stairs: (4 steps x2) General stair comments: initial vc's and demo and then pt able to perform on own safely without any further cueing  Wheelchair Mobility    Modified Rankin (Stroke Patients Only)       Balance Overall balance assessment: Needs assistance Sitting-balance support: No upper extremity supported;Feet supported Sitting balance-Leahy Scale: Normal Sitting balance - Comments: steady sitting reaching outside BOS   Standing balance support: No upper extremity supported Standing balance-Leahy Scale: Good Standing balance comment: steady standing reaching within BOS without UE support                            Cognition Arousal/Alertness: Awake/alert Behavior During Therapy: WFL for tasks assessed/performed Overall Cognitive Status: Within Functional Limits for tasks assessed                                        Exercises Total Joint Exercises Goniometric ROM: R knee extension ROM 0 degrees semi-supine in bed; R knee flexion AROM 102 degrees sitting edge of chair    General Comments General comments (skin integrity, edema, etc.): R LE dressings  and polar care in place upon PT entry.  Nursing cleared pt for participation in physical therapy.  Pt agreeable to PT session.      Pertinent Vitals/Pain Pain Assessment: 0-10 Pain Score: 6  Pain Location: R knee Pain Descriptors / Indicators: Sore;Tightness Pain Intervention(s): Limited activity within patient's tolerance;Monitored  during session;Premedicated before session;Repositioned;Patient requesting pain meds-RN notified(pt declined polar care d/t nursing tech present to wash pt up)  Vitals (HR and O2 on room air) stable and WFL throughout treatment session.    Home Living                      Prior Function            PT Goals (current goals can now be found in the care plan section) Acute Rehab PT Goals Patient Stated Goal: to improve mobility and have less pain PT Goal Formulation: With patient Time For Goal Achievement: 07/21/17 Potential to Achieve Goals: Good Progress towards PT goals: Progressing toward goals    Frequency    BID      PT Plan Current plan remains appropriate    Co-evaluation              AM-PAC PT "6 Clicks" Daily Activity  Outcome Measure  Difficulty turning over in bed (including adjusting bedclothes, sheets and blankets)?: None Difficulty moving from lying on back to sitting on the side of the bed? : A Little Difficulty sitting down on and standing up from a chair with arms (e.g., wheelchair, bedside commode, etc,.)?: A Little Help needed moving to and from a bed to chair (including a wheelchair)?: None Help needed walking in hospital room?: None Help needed climbing 3-5 steps with a railing? : A Little 6 Click Score: 21    End of Session Equipment Utilized During Treatment: Gait belt Activity Tolerance: Patient tolerated treatment well Patient left: with chair alarm set(sitting in chair with nursing tech present to assist pt with bath) Nurse Communication: Mobility status;Precautions;Weight bearing status;Patient requests pain meds PT Visit Diagnosis: Other abnormalities of gait and mobility (R26.89);Muscle weakness (generalized) (M62.81);Difficulty in walking, not elsewhere classified (R26.2);Pain Pain - Right/Left: Right Pain - part of body: Knee     Time: 4098-1191 PT Time Calculation (min) (ACUTE ONLY): 31 min  Charges:  $Gait Training:  8-22 mins $Therapeutic Exercise: 8-22 mins                    G CodesLeitha Bleak, PT 07/09/17, 9:33 AM [phone]

## 2017-07-09 NOTE — Care Management Note (Signed)
Case Management Note  Patient Details  Name: Claudia Terry MRN: [id number] Date of Birth: 02-15-1946  Subjective/Objective:  Discharging today                  Action/Plan: [facility] notified of discharge. Cost of Lovenox is $ 81.44. Patient updated and denies issues paying for prescription.   Expected Discharge Date:  07/09/17               Expected Discharge Plan:  [place]  In-House Referral:     Discharge planning Services  CM Consult  Post Acute Care Choice:  Home Health Choice offered to:  Patient  DME Arranged:    DME Agency:     HH Arranged:  PT [place]:  [facility] (formerly [employer])  Status of Service:  Completed, signed off  If discussed at [employer], dates discussed:    Additional Comments:  Jolly Mango, RN 07/09/2017, 8:49 AM

## 2017-07-10 DIAGNOSIS — Z7901 Long term (current) use of anticoagulants: Secondary | ICD-10-CM | POA: Diagnosis not present

## 2017-07-10 DIAGNOSIS — I10 Essential (primary) hypertension: Secondary | ICD-10-CM | POA: Diagnosis not present

## 2017-07-10 DIAGNOSIS — Z471 Aftercare following joint replacement surgery: Secondary | ICD-10-CM | POA: Diagnosis not present

## 2017-07-10 DIAGNOSIS — Z96653 Presence of artificial knee joint, bilateral: Secondary | ICD-10-CM | POA: Diagnosis not present

## 2017-07-10 DIAGNOSIS — G2581 Restless legs syndrome: Secondary | ICD-10-CM | POA: Diagnosis not present

## 2017-07-10 DIAGNOSIS — K219 Gastro-esophageal reflux disease without esophagitis: Secondary | ICD-10-CM | POA: Diagnosis not present

## 2017-07-10 DIAGNOSIS — Z9181 History of falling: Secondary | ICD-10-CM | POA: Diagnosis not present

## 2017-07-15 DIAGNOSIS — I10 Essential (primary) hypertension: Secondary | ICD-10-CM | POA: Diagnosis not present

## 2017-07-15 DIAGNOSIS — Z9181 History of falling: Secondary | ICD-10-CM | POA: Diagnosis not present

## 2017-07-15 DIAGNOSIS — Z7901 Long term (current) use of anticoagulants: Secondary | ICD-10-CM | POA: Diagnosis not present

## 2017-07-15 DIAGNOSIS — K219 Gastro-esophageal reflux disease without esophagitis: Secondary | ICD-10-CM | POA: Diagnosis not present

## 2017-07-15 DIAGNOSIS — Z96653 Presence of artificial knee joint, bilateral: Secondary | ICD-10-CM | POA: Diagnosis not present

## 2017-07-15 DIAGNOSIS — Z471 Aftercare following joint replacement surgery: Secondary | ICD-10-CM | POA: Diagnosis not present

## 2017-07-15 DIAGNOSIS — G2581 Restless legs syndrome: Secondary | ICD-10-CM | POA: Diagnosis not present

## 2017-07-21 DIAGNOSIS — Z471 Aftercare following joint replacement surgery: Secondary | ICD-10-CM | POA: Diagnosis not present

## 2017-07-21 DIAGNOSIS — I10 Essential (primary) hypertension: Secondary | ICD-10-CM | POA: Diagnosis not present

## 2017-07-21 DIAGNOSIS — Z7901 Long term (current) use of anticoagulants: Secondary | ICD-10-CM | POA: Diagnosis not present

## 2017-07-21 DIAGNOSIS — G2581 Restless legs syndrome: Secondary | ICD-10-CM | POA: Diagnosis not present

## 2017-07-21 DIAGNOSIS — Z9181 History of falling: Secondary | ICD-10-CM | POA: Diagnosis not present

## 2017-07-21 DIAGNOSIS — K219 Gastro-esophageal reflux disease without esophagitis: Secondary | ICD-10-CM | POA: Diagnosis not present

## 2017-07-21 DIAGNOSIS — Z96653 Presence of artificial knee joint, bilateral: Secondary | ICD-10-CM | POA: Diagnosis not present

## 2017-07-22 DIAGNOSIS — R262 Difficulty in walking, not elsewhere classified: Secondary | ICD-10-CM | POA: Diagnosis not present

## 2017-07-22 DIAGNOSIS — M6281 Muscle weakness (generalized): Secondary | ICD-10-CM | POA: Diagnosis not present

## 2017-07-22 DIAGNOSIS — M25562 Pain in left knee: Secondary | ICD-10-CM | POA: Diagnosis not present

## 2017-07-22 DIAGNOSIS — M25662 Stiffness of left knee, not elsewhere classified: Secondary | ICD-10-CM | POA: Diagnosis not present

## 2017-07-24 DIAGNOSIS — M6281 Muscle weakness (generalized): Secondary | ICD-10-CM | POA: Diagnosis not present

## 2017-07-28 DIAGNOSIS — M6281 Muscle weakness (generalized): Secondary | ICD-10-CM | POA: Diagnosis not present

## 2017-07-31 DIAGNOSIS — M6281 Muscle weakness (generalized): Secondary | ICD-10-CM | POA: Diagnosis not present

## 2017-08-04 DIAGNOSIS — M6281 Muscle weakness (generalized): Secondary | ICD-10-CM | POA: Diagnosis not present

## 2017-08-07 DIAGNOSIS — M6281 Muscle weakness (generalized): Secondary | ICD-10-CM | POA: Diagnosis not present

## 2017-08-11 DIAGNOSIS — M6281 Muscle weakness (generalized): Secondary | ICD-10-CM | POA: Diagnosis not present

## 2017-08-14 DIAGNOSIS — M6281 Muscle weakness (generalized): Secondary | ICD-10-CM | POA: Diagnosis not present

## 2017-08-19 DIAGNOSIS — M6281 Muscle weakness (generalized): Secondary | ICD-10-CM | POA: Diagnosis not present

## 2017-08-22 DIAGNOSIS — Z96651 Presence of right artificial knee joint: Secondary | ICD-10-CM | POA: Diagnosis not present

## 2017-08-28 DIAGNOSIS — R0781 Pleurodynia: Secondary | ICD-10-CM | POA: Diagnosis not present

## 2017-08-28 DIAGNOSIS — Z79899 Other long term (current) drug therapy: Secondary | ICD-10-CM | POA: Diagnosis not present

## 2017-08-28 DIAGNOSIS — R739 Hyperglycemia, unspecified: Secondary | ICD-10-CM | POA: Diagnosis not present

## 2017-08-28 DIAGNOSIS — E7800 Pure hypercholesterolemia, unspecified: Secondary | ICD-10-CM | POA: Diagnosis not present

## 2017-09-09 DIAGNOSIS — G2581 Restless legs syndrome: Secondary | ICD-10-CM | POA: Diagnosis not present

## 2017-09-09 DIAGNOSIS — E7800 Pure hypercholesterolemia, unspecified: Secondary | ICD-10-CM | POA: Diagnosis not present

## 2017-09-09 DIAGNOSIS — K219 Gastro-esophageal reflux disease without esophagitis: Secondary | ICD-10-CM | POA: Diagnosis not present

## 2017-09-09 DIAGNOSIS — R739 Hyperglycemia, unspecified: Secondary | ICD-10-CM | POA: Diagnosis not present

## 2017-09-09 DIAGNOSIS — I10 Essential (primary) hypertension: Secondary | ICD-10-CM | POA: Diagnosis not present

## 2017-09-09 DIAGNOSIS — Z79899 Other long term (current) drug therapy: Secondary | ICD-10-CM | POA: Diagnosis not present

## 2017-10-22 DIAGNOSIS — Z471 Aftercare following joint replacement surgery: Secondary | ICD-10-CM | POA: Diagnosis not present

## 2017-11-06 DIAGNOSIS — Z96651 Presence of right artificial knee joint: Secondary | ICD-10-CM | POA: Diagnosis not present

## 2017-11-10 DIAGNOSIS — M722 Plantar fascial fibromatosis: Secondary | ICD-10-CM | POA: Diagnosis not present

## 2017-11-10 DIAGNOSIS — M25571 Pain in right ankle and joints of right foot: Secondary | ICD-10-CM | POA: Diagnosis not present

## 2017-11-10 DIAGNOSIS — M216X1 Other acquired deformities of right foot: Secondary | ICD-10-CM | POA: Diagnosis not present

## 2017-11-10 DIAGNOSIS — M21071 Valgus deformity, not elsewhere classified, right ankle: Secondary | ICD-10-CM | POA: Diagnosis not present

## 2017-11-10 DIAGNOSIS — M216X2 Other acquired deformities of left foot: Secondary | ICD-10-CM | POA: Diagnosis not present

## 2017-11-10 DIAGNOSIS — S93312A Subluxation of tarsal joint of left foot, initial encounter: Secondary | ICD-10-CM | POA: Diagnosis not present

## 2017-11-10 DIAGNOSIS — S93311A Subluxation of tarsal joint of right foot, initial encounter: Secondary | ICD-10-CM | POA: Diagnosis not present

## 2017-11-19 DIAGNOSIS — L237 Allergic contact dermatitis due to plants, except food: Secondary | ICD-10-CM | POA: Diagnosis not present

## 2017-11-19 DIAGNOSIS — R21 Rash and other nonspecific skin eruption: Secondary | ICD-10-CM | POA: Diagnosis not present

## 2017-12-01 DIAGNOSIS — M216X2 Other acquired deformities of left foot: Secondary | ICD-10-CM | POA: Diagnosis not present

## 2017-12-01 DIAGNOSIS — M21071 Valgus deformity, not elsewhere classified, right ankle: Secondary | ICD-10-CM | POA: Diagnosis not present

## 2017-12-01 DIAGNOSIS — S93312A Subluxation of tarsal joint of left foot, initial encounter: Secondary | ICD-10-CM | POA: Diagnosis not present

## 2017-12-01 DIAGNOSIS — M722 Plantar fascial fibromatosis: Secondary | ICD-10-CM | POA: Diagnosis not present

## 2017-12-01 DIAGNOSIS — S93311A Subluxation of tarsal joint of right foot, initial encounter: Secondary | ICD-10-CM | POA: Diagnosis not present

## 2017-12-01 DIAGNOSIS — M25571 Pain in right ankle and joints of right foot: Secondary | ICD-10-CM | POA: Diagnosis not present

## 2017-12-01 DIAGNOSIS — M216X1 Other acquired deformities of right foot: Secondary | ICD-10-CM | POA: Diagnosis not present

## 2018-01-16 IMAGING — CR DG HIP (WITH OR WITHOUT PELVIS) 2-3V*L*
1 series · 3 of 3 positions shown · non-contrast
Comparison: None.

CLINICAL DATA: c/o left hip pain. Pt states working at home this
evening and tripped and fell onto left hip and heard a pop. Pt has
hx of left hip replacement 4 years ago, and right hip replacement
[REDACTED].

EXAM:
DG HIP (WITH OR WITHOUT PELVIS) 2-3V LEFT

[Series 1: dg hip unilat w or w/o pelvis 2-3 views  · non-contrast · 0.14mm/px · 3 of 3 slices shown]
[im 1/3]
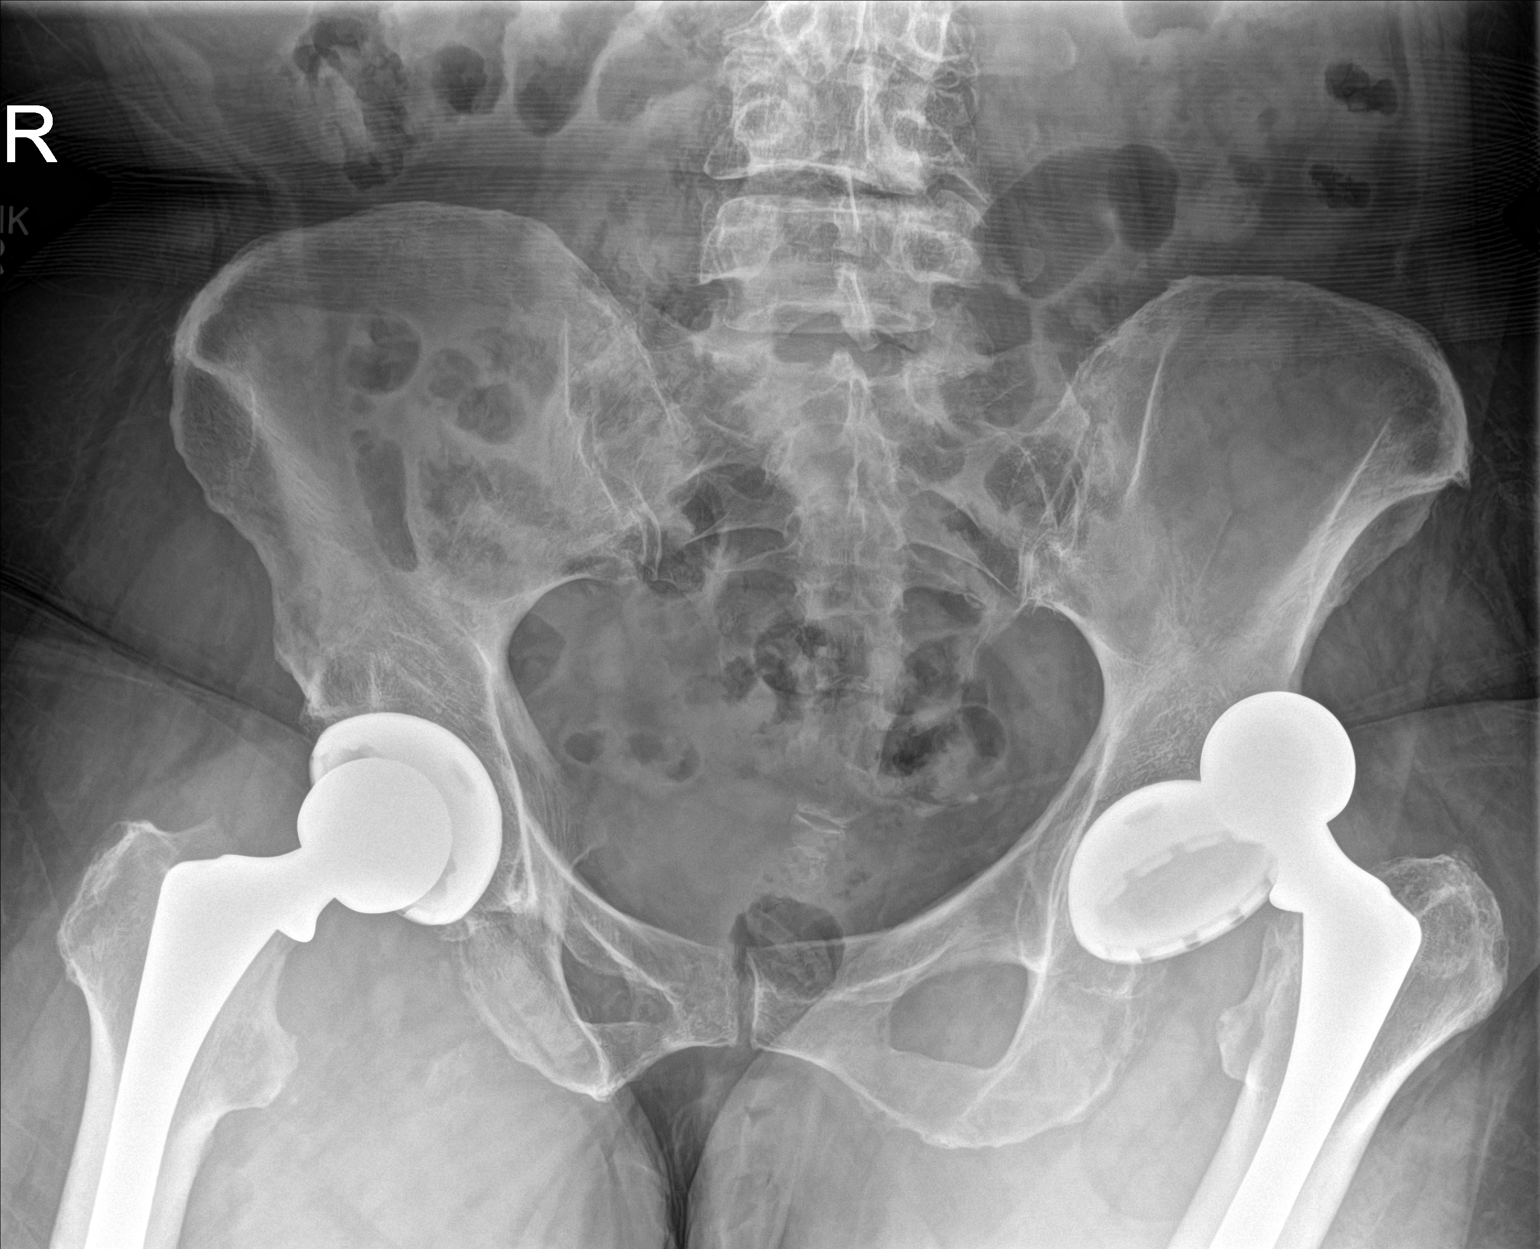
[im 2/3]
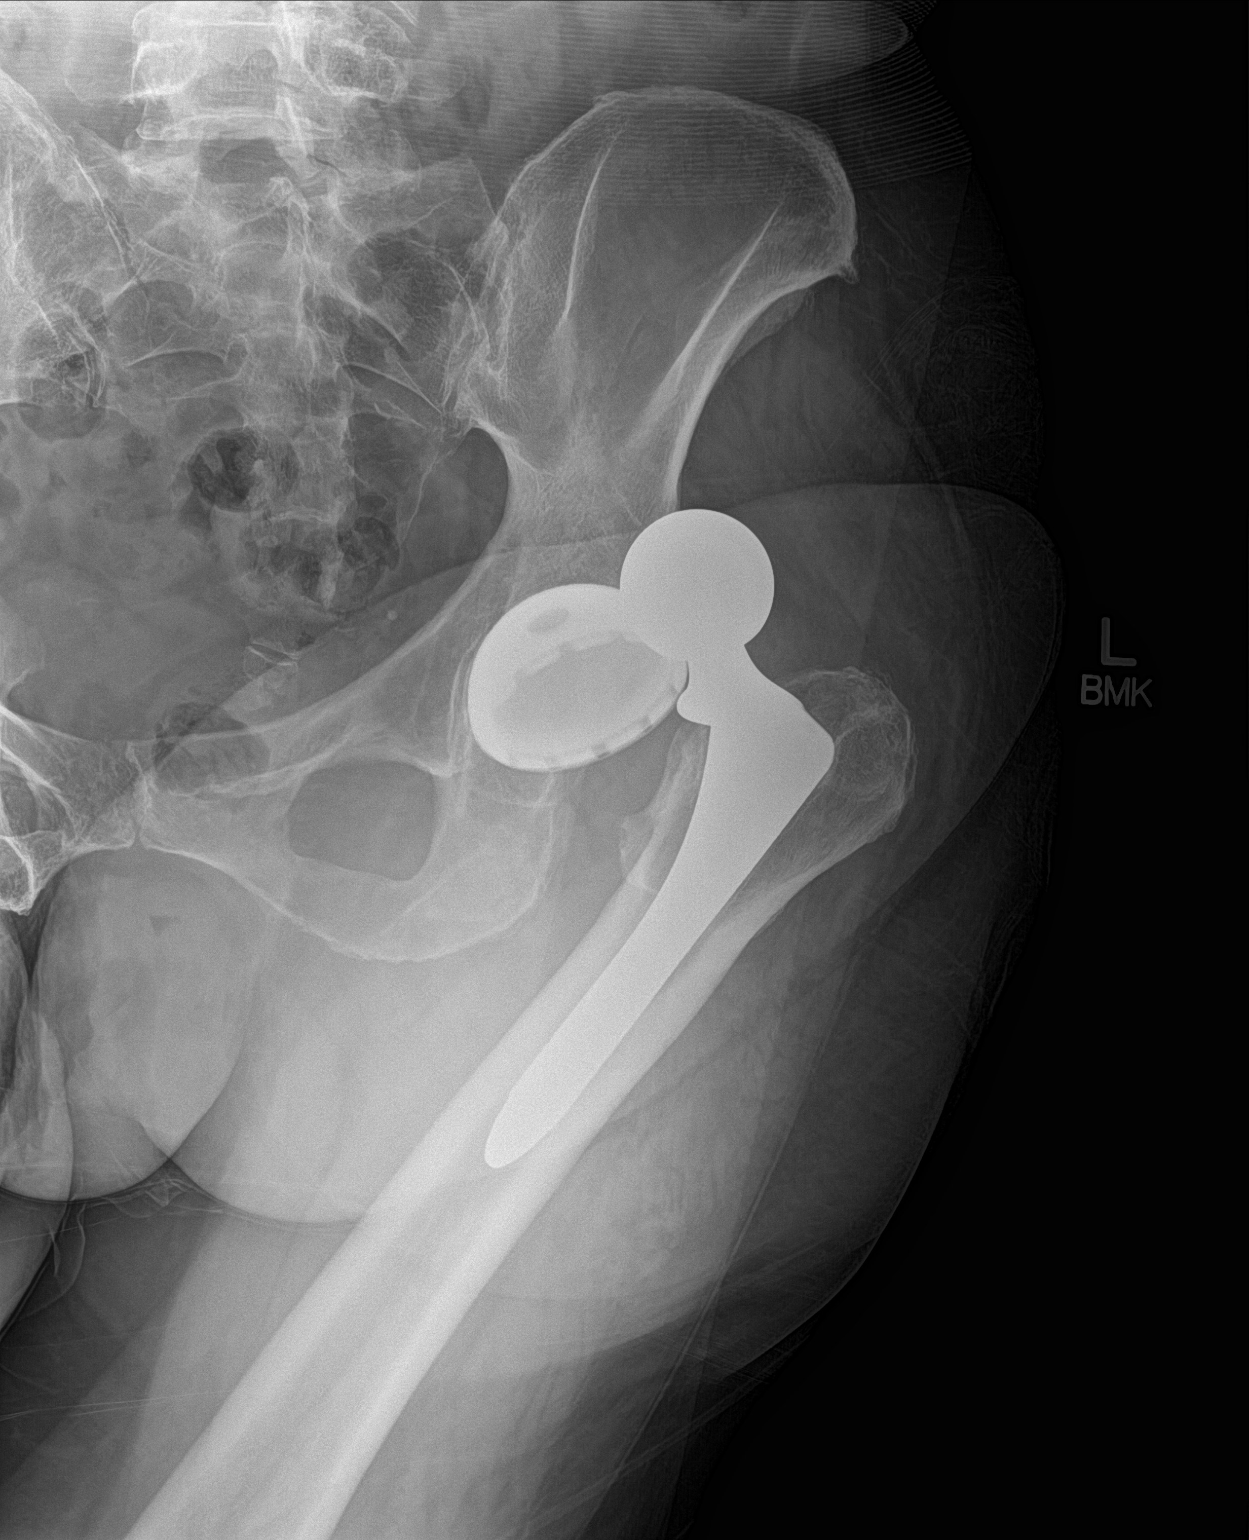
[im 3/3]
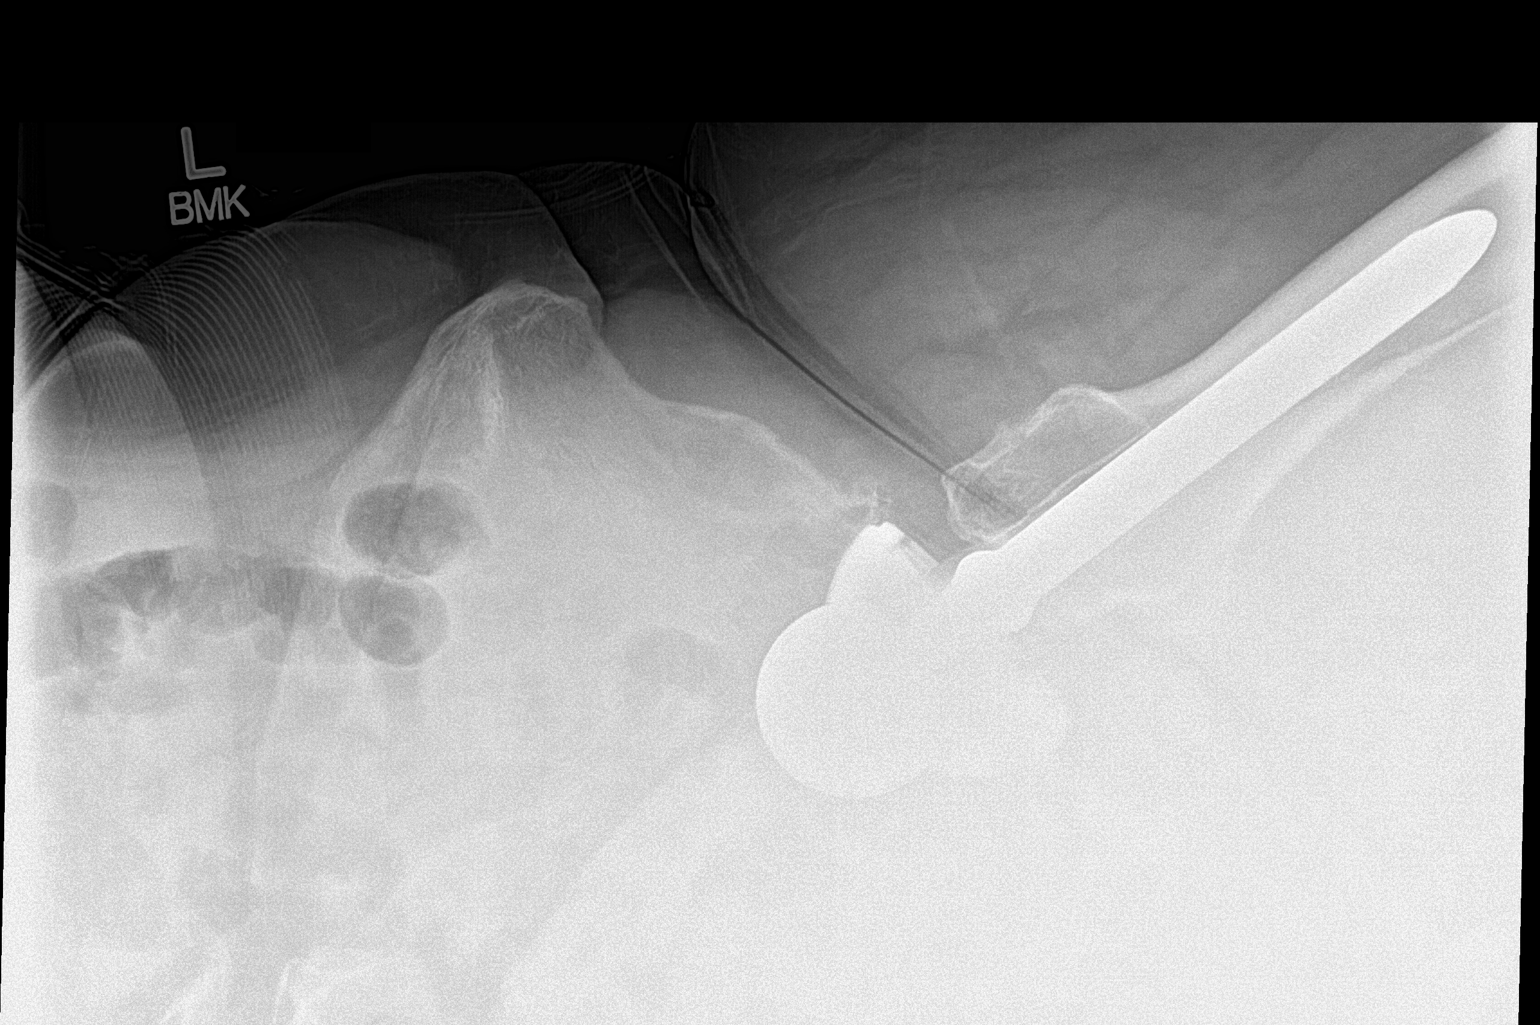

[3 of 3 positions shown; findings below may reference images not displayed]

FINDINGS: The left hip total arthroplasty is dislocated with the femoral head
component lying superior to the acetabular component.

There is no evidence of a fracture. The orthopedic hardware is
well-seated with no evidence of loosening. Right hip total
arthroplasty is well aligned on the single AP view. The bones are
demineralized.
IMPRESSION: 1. Dislocated left hip prosthesis.  No fracture

## 2018-01-27 DIAGNOSIS — M25571 Pain in right ankle and joints of right foot: Secondary | ICD-10-CM | POA: Diagnosis not present

## 2018-01-27 DIAGNOSIS — Q6622 Congenital metatarsus adductus: Secondary | ICD-10-CM | POA: Diagnosis not present

## 2018-01-27 DIAGNOSIS — M19072 Primary osteoarthritis, left ankle and foot: Secondary | ICD-10-CM | POA: Diagnosis not present

## 2018-01-27 DIAGNOSIS — M216X2 Other acquired deformities of left foot: Secondary | ICD-10-CM | POA: Diagnosis not present

## 2018-01-27 DIAGNOSIS — M19071 Primary osteoarthritis, right ankle and foot: Secondary | ICD-10-CM | POA: Diagnosis not present

## 2018-01-27 DIAGNOSIS — M216X1 Other acquired deformities of right foot: Secondary | ICD-10-CM | POA: Diagnosis not present

## 2018-02-10 DIAGNOSIS — Z8371 Family history of colonic polyps: Secondary | ICD-10-CM | POA: Diagnosis not present

## 2018-02-10 DIAGNOSIS — K210 Gastro-esophageal reflux disease with esophagitis: Secondary | ICD-10-CM | POA: Diagnosis not present

## 2018-02-10 DIAGNOSIS — Z83719 Family history of colon polyps, unspecified: Secondary | ICD-10-CM | POA: Insufficient documentation

## 2018-02-17 DIAGNOSIS — M19071 Primary osteoarthritis, right ankle and foot: Secondary | ICD-10-CM | POA: Diagnosis not present

## 2018-02-17 DIAGNOSIS — S93312A Subluxation of tarsal joint of left foot, initial encounter: Secondary | ICD-10-CM | POA: Diagnosis not present

## 2018-02-17 DIAGNOSIS — M19072 Primary osteoarthritis, left ankle and foot: Secondary | ICD-10-CM | POA: Diagnosis not present

## 2018-02-17 DIAGNOSIS — M21071 Valgus deformity, not elsewhere classified, right ankle: Secondary | ICD-10-CM | POA: Diagnosis not present

## 2018-02-17 DIAGNOSIS — S93311A Subluxation of tarsal joint of right foot, initial encounter: Secondary | ICD-10-CM | POA: Diagnosis not present

## 2018-02-17 DIAGNOSIS — Q6622 Congenital metatarsus adductus: Secondary | ICD-10-CM | POA: Diagnosis not present

## 2018-03-02 NOTE — Progress Notes (Signed)
72 y.o. G27P2002 Married Caucasian female here for annual exam.    Having night time urination.   Weight gain.   Will have orthotics made for her feet.  Retired from [employer].  Just renewed her license.  PCP:  Derinda Late   Patient's last menstrual period was 02/08/2005 (approximate).           Sexually active: Yes.    The current method of family planning is vasectomy.    Exercising: Yes.    Gym/ health club routine includes Water aerobics 3 times a week . Smoker:  no  Health Maintenance: Pap:  02/08/15 - pap normal.  History of abnormal Pap:  no MMG: 03/20/16 Density category A Bi-rads category 1 neg.  Done in Oct. 2018 per patient. Told it was normal.  She is due again next month.  Colonoscopy:  2013 neg.  Scheduled for Apr 13, 2018.  BMD: 11-13-11   Result  t-score -0.05 normal.    TDaP:  06/10/2004 Gardasil:   no HIV: --- Hep C: 04/05/16 neg  Screening Labs:    reports that she has never smoked. She has never used smokeless tobacco. She reports that she does not drink alcohol or use drugs.  Past Medical History:  Diagnosis Date  . Arthritis   . 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 GERD (gastroesophageal reflux disease) 8/01    Dr. Tiffany Kocher  . Hypertension   . Low back pain 11/95  . Menopause   . Restless leg syndrome 1999    Past Surgical History:  Procedure Laterality Date  . [place]   X2  . COLONOSCOPY  12/06/11   normal  . ESOPHAGOGASTRODUODENOSCOPY (EGD) WITH PROPOFOL N/A 02/23/2015   Procedure: ESOPHAGOGASTRODUODENOSCOPY (EGD) WITH PROPOFOL;  Surgeon: Manya Silvas, MD;  Location[facility] ENDOSCOPY;  Service: Endoscopy;  Laterality: N/A;  . HIP CLOSED REDUCTION Left 08/08/2015   Procedure: CLOSED REDUCTION HIP;  Surgeon: Dereck Leep, MD;  Location: [facility] ORS;  Service: Orthopedics;  Laterality: Left;  . HIP CLOSED REDUCTION Left 09/18/2015   Procedure: CLOSED REDUCTION HIP;  Surgeon: Hessie Knows,  MD;  Location: [facility] ORS;  Service: Orthopedics;  Laterality: Left;  . HIP CLOSED REDUCTION Left 12/01/2015   Procedure: CLOSED REDUCTION HIP;  Surgeon: Dereck Leep, MD;  Location: [facility] ORS;  Service: Orthopedics;  Laterality: Left;  no prep no incision  . JOINT REPLACEMENT  10/2016   left knee  . KNEE ARTHROPLASTY Left 10/28/2016   Procedure: COMPUTER ASSISTED TOTAL KNEE ARTHROPLASTY;  Surgeon: Dereck Leep, MD;  Location: [facility] ORS;  Service: Orthopedics;  Laterality: Left;  . KNEE ARTHROPLASTY Right 07/07/2017   Procedure: COMPUTER ASSISTED TOTAL KNEE ARTHROPLASTY;  Surgeon: Dereck Leep, MD;  Location: [facility] ORS;  Service: Orthopedics;  Laterality: Right;  . KNEE ARTHROSCOPY Right 11/14/2014   Procedure: ARTHROSCOPY KNEE;  Surgeon: Dereck Leep, MD;  Location: [facility] ORS;  Service: Orthopedics;  Laterality: Right;  Posterior horn menicus, anterior lateral menicus grade 3 chondromalacia  . phlebitis groin    . TONSILLECTOMY    . TOTAL HIP ARTHROPLASTY Left 05/08/11   necrosis of hip  . TOTAL HIP ARTHROPLASTY Right 06/14/2015   Procedure: TOTAL HIP ARTHROPLASTY;  Surgeon: Dereck Leep, MD;  Location: [facility] ORS;  Service: Orthopedics;  Laterality: Right;  . TOTAL HIP REVISION Left 02/21/2016   Procedure: TOTAL HIP REVISION;  Surgeon: Dereck Leep, MD;  Location:  [facility] ORS;  Service: Orthopedics;  Laterality: Left;    Current Outpatient Medications  Medication Sig Dispense Refill  . acetaminophen (TYLENOL) 500 MG tablet Take 1,000 mg by mouth every 6 (six) hours as needed (for knee pain.).     Marland Kitchen atenolol (TENORMIN) 50 MG tablet Take 50 mg by mouth daily.     . Biotin 5000 MCG TABS Take 1 capsule by mouth daily.    . Calcium-Phosphorus-Vitamin D (CITRACAL +D3 PO) Take 1 tablet by mouth 2 (two) times daily.     . Cholecalciferol (VITAMIN D3) 2000 units capsule Take 2,000 Units by mouth daily.    . Melatonin 10 MG TABS Take 10 mg by mouth at bedtime.    . pramipexole (MIRAPEX) 1 MG tablet  Take 1 mg by mouth at bedtime.     . Red Yeast Rice 600 MG TABS Take 600 mg by mouth 2 (two) times daily.     Marland Kitchen enoxaparin (LOVENOX) 40 MG/0.4ML injection Inject 0.4 mLs (40 mg total) into the skin daily for 14 days. 14 Syringe 0  . Multiple Vitamins-Minerals (HAIR SKIN NAILS PO) Take 1 tablet by mouth 2 (two) times daily.    . traMADol (ULTRAM) 50 MG tablet Take 1-2 tablets (50-100 mg total) by mouth every 4 (four) hours as needed for moderate pain. (Patient not taking: Reported on 03/04/2018) 60 tablet 0   No current facility-administered medications for this visit.     Family History  Problem Relation Age of Onset  . Diabetes Mother   . Diabetes Maternal Grandfather   . Hypertension Maternal Grandfather   . Ovarian cancer Paternal Aunt   . Cancer - Lung Paternal Uncle     Review of Systems  Skin:       Foot swelling   All other systems reviewed and are negative.   Exam:   BP 110/60   Pulse 64   Resp 14   Ht 5' 3.75" (1.619 m)   Wt 196 lb (88.9 kg)   LMP 02/08/2005 (Approximate)   BMI 33.91 kg/m     General appearance: alert, cooperative and appears stated age Head: Normocephalic, without obvious abnormality, atraumatic Neck: no adenopathy, supple, symmetrical, trachea midline and thyroid normal to inspection and palpation Lungs: clear to auscultation bilaterally Breasts: normal appearance, no masses or tenderness, No nipple retraction or dimpling, No nipple discharge or bleeding, No axillary or supraclavicular adenopathy Heart: regular rate and rhythm Abdomen: soft, non-tender; no masses, no organomegaly Extremities: extremities normal, atraumatic, no cyanosis or edema Skin: Skin color, texture, turgor normal. No rashes or lesions Lymph nodes: Cervical, supraclavicular, and axillary nodes normal. No abnormal inguinal nodes palpated Neurologic: Grossly normal  Pelvic: External genitalia:  no lesions              Urethra:  normal appearing urethra with no masses,  tenderness or lesions              Bartholins and Skenes: normal                 Vagina: normal appearing vagina with normal color and discharge, no lesions              Cervix: no lesions              Pap taken: Yes.   Bimanual Exam:  Uterus:  normal size, contour, position, consistency, mobility, non-tender              Adnexa: no mass, fullness, tenderness  Rectal exam: Yes.  .  Confirms.              Anus:  normal sphincter tone, no lesions  Chaperone was present for exam.  Assessment:   Well woman visit with normal exam. Multiple orthopedic surgeries.   Plan: Mammogram screening. Will get reports.  Recommended self breast awareness. Pap and HR HPV as above. Guidelines for Calcium, Vitamin D, regular exercise program including cardiovascular and weight bearing exercise. Labs with PCP.  Follow up annually and prn.    After visit summary provided.

## 2018-03-04 ENCOUNTER — Other Ambulatory Visit (HOSPITAL_COMMUNITY)
Admission: RE | Admit: 2018-03-04 | Discharge: 2018-03-04 | Disposition: A | Discharge status: Home / Self Care | Payer: PPO | Source: Ambulatory Visit | Attending: Obstetrics and Gynecology | Admitting: Obstetrics and Gynecology

## 2018-03-04 ENCOUNTER — Ambulatory Visit (INDEPENDENT_AMBULATORY_CARE_PROVIDER_SITE_OTHER): Payer: PPO | Admitting: Obstetrics and Gynecology

## 2018-03-04 VITALS — BP 110/60 | HR 64 | Resp 14 | Ht 63.75 in | Wt 196.0 lb

## 2018-03-04 DIAGNOSIS — Z01419 Encounter for gynecological examination (general) (routine) without abnormal findings: Secondary | ICD-10-CM | POA: Insufficient documentation

## 2018-03-04 NOTE — Patient Instructions (Signed)

## 2018-03-05 DIAGNOSIS — Z79899 Other long term (current) drug therapy: Secondary | ICD-10-CM | POA: Diagnosis not present

## 2018-03-05 DIAGNOSIS — E7800 Pure hypercholesterolemia, unspecified: Secondary | ICD-10-CM | POA: Diagnosis not present

## 2018-03-05 DIAGNOSIS — R739 Hyperglycemia, unspecified: Secondary | ICD-10-CM | POA: Diagnosis not present

## 2018-03-06 LAB — CYTOLOGY - PAP: Diagnosis: NEGATIVE

## 2018-03-07 ENCOUNTER — Encounter: Payer: Self-pay | Admitting: Obstetrics and Gynecology

## 2018-03-09 DIAGNOSIS — M19072 Primary osteoarthritis, left ankle and foot: Secondary | ICD-10-CM | POA: Diagnosis not present

## 2018-03-09 DIAGNOSIS — M19071 Primary osteoarthritis, right ankle and foot: Secondary | ICD-10-CM | POA: Diagnosis not present

## 2018-03-09 DIAGNOSIS — Q6622 Congenital metatarsus adductus: Secondary | ICD-10-CM | POA: Diagnosis not present

## 2018-03-09 DIAGNOSIS — M21071 Valgus deformity, not elsewhere classified, right ankle: Secondary | ICD-10-CM | POA: Diagnosis not present

## 2018-03-12 DIAGNOSIS — Z0000 Encounter for general adult medical examination without abnormal findings: Secondary | ICD-10-CM | POA: Diagnosis not present

## 2018-03-16 ENCOUNTER — Encounter: Payer: Self-pay | Admitting: Obstetrics and Gynecology

## 2018-03-26 DIAGNOSIS — Z1231 Encounter for screening mammogram for malignant neoplasm of breast: Secondary | ICD-10-CM | POA: Diagnosis not present

## 2018-04-06 DIAGNOSIS — M19072 Primary osteoarthritis, left ankle and foot: Secondary | ICD-10-CM | POA: Diagnosis not present

## 2018-04-06 DIAGNOSIS — Q66222 Congenital metatarsus adductus, left foot: Secondary | ICD-10-CM | POA: Diagnosis not present

## 2018-04-06 DIAGNOSIS — M21071 Valgus deformity, not elsewhere classified, right ankle: Secondary | ICD-10-CM | POA: Diagnosis not present

## 2018-04-06 DIAGNOSIS — M19071 Primary osteoarthritis, right ankle and foot: Secondary | ICD-10-CM | POA: Diagnosis not present

## 2018-04-06 DIAGNOSIS — Q66221 Congenital metatarsus adductus, right foot: Secondary | ICD-10-CM | POA: Diagnosis not present

## 2018-04-10 ENCOUNTER — Encounter: Payer: Self-pay | Admitting: *Deleted

## 2018-04-12 ENCOUNTER — Encounter: Payer: Self-pay | Admitting: Anesthesiology

## 2018-04-13 ENCOUNTER — Ambulatory Visit: Payer: PPO | Admitting: Anesthesiology

## 2018-04-13 ENCOUNTER — Encounter
Admission: RE | Disposition: A | Discharge status: Home / Self Care | Payer: Self-pay | Source: Ambulatory Visit | Attending: Unknown Physician Specialty

## 2018-04-13 ENCOUNTER — Ambulatory Visit
Admission: RE | Admit: 2018-04-13 | Discharge: 2018-04-13 | Disposition: A | Discharge status: Home / Self Care | Payer: PPO | Source: Ambulatory Visit | Attending: Unknown Physician Specialty | Admitting: Unknown Physician Specialty

## 2018-04-13 ENCOUNTER — Encounter: Payer: Self-pay | Admitting: Certified Registered Nurse Anesthetist

## 2018-04-13 DIAGNOSIS — K209 Esophagitis, unspecified: Secondary | ICD-10-CM | POA: Diagnosis not present

## 2018-04-13 DIAGNOSIS — K5730 Diverticulosis of large intestine without perforation or abscess without bleeding: Secondary | ICD-10-CM | POA: Insufficient documentation

## 2018-04-13 DIAGNOSIS — Z1211 Encounter for screening for malignant neoplasm of colon: Secondary | ICD-10-CM | POA: Insufficient documentation

## 2018-04-13 DIAGNOSIS — I10 Essential (primary) hypertension: Secondary | ICD-10-CM | POA: Insufficient documentation

## 2018-04-13 DIAGNOSIS — Z79899 Other long term (current) drug therapy: Secondary | ICD-10-CM | POA: Diagnosis not present

## 2018-04-13 DIAGNOSIS — G2581 Restless legs syndrome: Secondary | ICD-10-CM | POA: Insufficient documentation

## 2018-04-13 DIAGNOSIS — K210 Gastro-esophageal reflux disease with esophagitis: Secondary | ICD-10-CM | POA: Diagnosis not present

## 2018-04-13 DIAGNOSIS — R599 Enlarged lymph nodes, unspecified: Secondary | ICD-10-CM | POA: Insufficient documentation

## 2018-04-13 DIAGNOSIS — K640 First degree hemorrhoids: Secondary | ICD-10-CM | POA: Insufficient documentation

## 2018-04-13 DIAGNOSIS — Z8371 Family history of colonic polyps: Secondary | ICD-10-CM | POA: Insufficient documentation

## 2018-04-13 DIAGNOSIS — K648 Other hemorrhoids: Secondary | ICD-10-CM | POA: Diagnosis not present

## 2018-04-13 DIAGNOSIS — K219 Gastro-esophageal reflux disease without esophagitis: Secondary | ICD-10-CM | POA: Diagnosis not present

## 2018-04-13 HISTORY — PX: COLONOSCOPY WITH PROPOFOL: SHX5780

## 2018-04-13 HISTORY — PX: ESOPHAGOGASTRODUODENOSCOPY (EGD) WITH PROPOFOL: SHX5813

## 2018-04-13 SURGERY — COLONOSCOPY WITH PROPOFOL
Anesthesia: General

## 2018-04-13 MED ORDER — PROPOFOL 10 MG/ML IV BOLUS
INTRAVENOUS | Status: DC | PRN
  Administered 2018-04-13: 21 mg via INTRAVENOUS
  Administered 2018-04-13: 100 mg via INTRAVENOUS
  Administered 2018-04-13 (×2): 21 mg via INTRAVENOUS
  Administered 2018-04-13: 17 mg via INTRAVENOUS

## 2018-04-13 MED ORDER — PROPOFOL 500 MG/50ML IV EMUL
INTRAVENOUS | Status: AC
  Filled 2018-04-13: qty 50

## 2018-04-13 MED ORDER — LIDOCAINE HCL (PF) 2 % IJ SOLN
INTRAMUSCULAR | Status: AC
  Filled 2018-04-13: qty 10

## 2018-04-13 MED ORDER — LIDOCAINE HCL (CARDIAC) PF 100 MG/5ML IV SOSY
PREFILLED_SYRINGE | INTRAVENOUS | Status: DC | PRN
  Administered 2018-04-13: 50 mg via INTRAVENOUS

## 2018-04-13 MED ORDER — VANCOMYCIN HCL IN DEXTROSE 750-5 MG/150ML-% IV SOLN
750.0000 mg | Freq: Once | INTRAVENOUS | Status: AC
  Administered 2018-04-13: 750 mg via INTRAVENOUS
  Filled 2018-04-13: qty 150

## 2018-04-13 MED ORDER — SODIUM CHLORIDE 0.9 % IV SOLN
INTRAVENOUS | Status: DC
  Administered 2018-04-13: 1000 mL via INTRAVENOUS

## 2018-04-13 MED ORDER — GENTAMICIN IN SALINE 1.6-0.9 MG/ML-% IV SOLN
80.0000 mg | Freq: Once | INTRAVENOUS | Status: AC
  Administered 2018-04-13: 80 mg via INTRAVENOUS
  Filled 2018-04-13: qty 50

## 2018-04-13 MED ORDER — SODIUM CHLORIDE 0.9 % IV SOLN: INTRAVENOUS | Status: DC

## 2018-04-13 MED ORDER — PROPOFOL 500 MG/50ML IV EMUL
INTRAVENOUS | Status: DC | PRN
  Administered 2018-04-13: 135 ug/kg/min via INTRAVENOUS

## 2018-04-13 NOTE — Op Note (Signed)
[facility] Gastroenterology Patient Name: Claudia Terry Procedure Date: 04/13/2018 8:52 AM MRN: [id number] Account #: [id number] Date of Birth: Apr 08, 1946 Admit Type: Outpatient Age: 72 Room: [facility] ENDO ROOM 1 Gender: Female Note Status: Finalized Procedure:            Upper GI endoscopy Indications:          Heartburn, Follow-up of esophagitis Providers:            Manya Silvas, MD Referring MD:         Caprice Renshaw MD (Referring MD) Medicines:            Propofol per Anesthesia Complications:        No immediate complications. Procedure:            Pre-Anesthesia Assessment:                       - After reviewing the risks and benefits, the patient                        was deemed in satisfactory condition to undergo the                        procedure.                       After obtaining informed consent, the endoscope was                        passed under direct vision. Throughout the procedure,                        the patient's blood pressure, pulse, and oxygen                        saturations were monitored continuously. The Endoscope                        was introduced through the mouth, and advanced to the                        second part of duodenum. The upper GI endoscopy was                        accomplished without difficulty. The patient tolerated                        the procedure well. Findings:      LA Grade A (one or more mucosal breaks less than 5 mm, not extending       between tops of 2 mucosal folds) esophagitis with no bleeding was found       37 cm from the incisors. Biopsies were taken with a cold forceps for       histology.      Normal mucosa was found in the stomach. Biopsies were taken with a cold       forceps for histology. Biopsies were taken with a cold forceps for       Helicobacter pylori testing.      The examined duodenum was normal. Impression:           - LA Grade A reflux esophagitis. Rule out  Barrett's                        esophagus. Biopsied.                       - Normal mucosa was found in the stomach. Biopsied.                       - Normal examined duodenum. Recommendation:       - Await pathology results. Manya Silvas, MD 04/13/2018 9:41:44 AM This report has been signed electronically. Number of Addenda: 0 Note Initiated On: 04/13/2018 8:52 AM      [facility]

## 2018-04-13 NOTE — Transfer of Care (Signed)
Immediate Anesthesia Transfer of Care Note  Patient: Frady Taddeo Kurdziel  Procedure(s) Performed: COLONOSCOPY WITH PROPOFOL (N/A ) ESOPHAGOGASTRODUODENOSCOPY (EGD) WITH PROPOFOL (N/A )  Patient Location: PACU and Endoscopy Unit  Anesthesia Type:General  Level of Consciousness: drowsy  Airway & Oxygen Therapy: Patient Spontanous Breathing  Post-op Assessment: Report given to RN and Post -op Vital signs reviewed and stable  Post vital signs: Reviewed and stable  Last Vitals:  Vitals Value Taken Time  BP 119/65 04/13/2018 10:02 AM  Temp 36.1 C 04/13/2018 10:01 AM  Pulse 75 04/13/2018 10:02 AM  Resp 17 04/13/2018 10:02 AM  SpO2 96 % 04/13/2018 10:02 AM  Vitals shown include unvalidated device data.  Last Pain:  Vitals:   04/13/18 1001  TempSrc: Tympanic  PainSc: 0-No pain         Complications: No apparent anesthesia complications

## 2018-04-13 NOTE — Anesthesia Preprocedure Evaluation (Signed)
Anesthesia Evaluation  Patient identified by MRN, date of birth, ID band Patient awake    Reviewed: Allergy & Precautions, NPO status , Patient's Chart, lab work & pertinent test results, reviewed documented beta blocker date and time   Airway Mallampati: II  TM Distance: >3 FB     Dental  (+) Chipped   Pulmonary           Cardiovascular hypertension, Pt. on medications      Neuro/Psych    GI/Hepatic GERD  ,  Endo/Other    Renal/GU      Musculoskeletal  (+) Arthritis ,   Abdominal   Peds  Hematology   Anesthesia Other Findings EKG ok.  Reproductive/Obstetrics                             Anesthesia Physical Anesthesia Plan  ASA: II  Anesthesia Plan: General   Post-op Pain Management:    Induction: Intravenous  PONV Risk Score and Plan:   Airway Management Planned:   Additional Equipment:   Intra-op Plan:   Post-operative Plan:   Informed Consent: I have reviewed the patients History and Physical, chart, labs and discussed the procedure including the risks, benefits and alternatives for the proposed anesthesia with the patient or authorized representative who has indicated his/her understanding and acceptance.     Plan Discussed with: CRNA  Anesthesia Plan Comments:         Anesthesia Quick Evaluation

## 2018-04-13 NOTE — Op Note (Signed)
[facility] Gastroenterology Patient Name: Claudia Terry Procedure Date: 04/13/2018 8:50 AM MRN: [id number] Account #: [id number] Date of Birth: [birth year] Admit Type: Outpatient Age: 72 Room: [facility] ENDO ROOM 1 Gender: Female Note Status: Finalized Procedure:            Colonoscopy Indications:          Colon cancer screening in patient at increased risk:                        Family history of 1st-degree relative with colon polyps Providers:            Manya Silvas, MD Referring MD:         Caprice Renshaw MD (Referring MD) Medicines:            Propofol per Anesthesia Complications:        No immediate complications. Procedure:            Pre-Anesthesia Assessment:                       - After reviewing the risks and benefits, the patient                        was deemed in satisfactory condition to undergo the                        procedure.                       After obtaining informed consent, the colonoscope was                        passed under direct vision. Throughout the procedure,                        the patient's blood pressure, pulse, and oxygen                        saturations were monitored continuously. The                        Colonoscope was introduced through the anus and                        advanced to the the cecum, identified by appendiceal                        orifice and ileocecal valve. Scope manuver                       The colonoscopy was somewhat difficult due to a                        tortuous colon. Successful completion of the procedure                        was aided by skillful guidence Findings:      Internal hemorrhoids were found during endoscopy. The hemorrhoids were       small, medium-sized and Grade I (internal hemorrhoids that do not       prolapse).      A single small-mouthed diverticulum  was found in the sigmoid colon.      The exam was otherwise without abnormality. Impression:           -  Internal hemorrhoids.                       - Diverticulosis in the sigmoid colon.                       - The examination was otherwise normal.                       - No specimens collected. Recommendation:       - Repeat colonoscopy in 5 years for surveillance. Manya Silvas, MD 04/13/2018 10:03:01 AM This report has been signed electronically. Number of Addenda: 0 Note Initiated On: 04/13/2018 8:50 AM Scope Withdrawal Time: 0 hours 4 minutes 33 seconds  Total Procedure Duration: 0 hours 12 minutes 31 seconds       [facility]

## 2018-04-13 NOTE — H&P (Signed)
Primary Care Physician:  Derinda Late, MD Primary Gastroenterologist:  Dr. Vira Agar  Pre-Procedure History & Physical: HPI:  Claudia Terry is a 72 y.o. female is here for an endoscopy and colonoscopy.   Past Medical History:  Diagnosis Date  . Arthritis   . 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 GERD (gastroesophageal reflux disease) 8/01    Dr. Tiffany Kocher  . Hypertension   . Low back pain 11/95  . Menopause   . Restless leg syndrome 1999    Past Surgical History:  Procedure Laterality Date  . [place]   X2  . COLONOSCOPY  12/06/11   normal  . ESOPHAGOGASTRODUODENOSCOPY (EGD) WITH PROPOFOL N/A 02/23/2015   Procedure: ESOPHAGOGASTRODUODENOSCOPY (EGD) WITH PROPOFOL;  Surgeon: Manya Silvas, MD;  Location: [facility] ENDOSCOPY;  Service: Endoscopy;  Laterality: N/A;  . HIP CLOSED REDUCTION Left 08/08/2015   Procedure: CLOSED REDUCTION HIP;  Surgeon: Dereck Leep, MD;  Location: [facility] ORS;  Service: Orthopedics;  Laterality: Left;  . HIP CLOSED REDUCTION Left 09/18/2015   Procedure: CLOSED REDUCTION HIP;  Surgeon: Hessie Knows, MD;  Location: [facility] ORS;  Service: Orthopedics;  Laterality: Left;  . HIP CLOSED REDUCTION Left 12/01/2015   Procedure: CLOSED REDUCTION HIP;  Surgeon: Dereck Leep, MD;  Location: [facility] ORS;  Service: Orthopedics;  Laterality: Left;  no prep no incision  . JOINT REPLACEMENT  10/2016   left knee  . KNEE ARTHROPLASTY Left 10/28/2016   Procedure: COMPUTER ASSISTED TOTAL KNEE ARTHROPLASTY;  Surgeon: Dereck Leep, MD;  Location: [facility] ORS;  Service: Orthopedics;  Laterality: Left;  . KNEE ARTHROPLASTY Right 07/07/2017   Procedure: COMPUTER ASSISTED TOTAL KNEE ARTHROPLASTY;  Surgeon: Dereck Leep, MD;  Location: [facility] ORS;  Service: Orthopedics;  Laterality: Right;  . KNEE ARTHROSCOPY Right 11/14/2014   Procedure: ARTHROSCOPY KNEE;  Surgeon: Dereck Leep, MD;  Location: [facility] ORS;  Service: Orthopedics;   Laterality: Right;  Posterior horn menicus, anterior lateral menicus grade 3 chondromalacia  . phlebitis groin    . TONSILLECTOMY    . TOTAL HIP ARTHROPLASTY Left 05/08/11   necrosis of hip  . TOTAL HIP ARTHROPLASTY Right 06/14/2015   Procedure: TOTAL HIP ARTHROPLASTY;  Surgeon: Dereck Leep, MD;  Location: [facility] ORS;  Service: Orthopedics;  Laterality: Right;  . TOTAL HIP REVISION Left 02/21/2016   Procedure: TOTAL HIP REVISION;  Surgeon: Dereck Leep, MD;  Location: [facility] ORS;  Service: Orthopedics;  Laterality: Left;    Prior to Admission medications   Medication Sig Start Date End Date Taking? Authorizing Provider  acetaminophen (TYLENOL) 500 MG tablet Take 1,000 mg by mouth every 6 (six) hours as needed (for knee pain.).    Yes [provider]  atenolol (TENORMIN) 50 MG tablet Take 50 mg by mouth daily.    Yes [provider]  Biotin 5000 MCG TABS Take 1 capsule by mouth daily.   Yes [provider]  Calcium-Phosphorus-Vitamin D (CITRACAL +D3 PO) Take 1 tablet by mouth 2 (two) times daily.    Yes [provider]  Cholecalciferol (VITAMIN D3) 2000 units capsule Take 2,000 Units by mouth daily.   Yes [provider]  Melatonin 10 MG TABS Take 10 mg by mouth at bedtime.   Yes [provider]  omeprazole (PRILOSEC) 20 MG capsule Take 20 mg by mouth daily.   Yes [provider]  pramipexole (MIRAPEX) 1 MG tablet Take  1 mg by mouth at bedtime.    Yes [provider]  Red Yeast Rice 600 MG TABS Take 600 mg by mouth 2 (two) times daily.    Yes [provider]  enoxaparin (LOVENOX) 40 MG/0.4ML injection Inject 0.4 mLs (40 mg total) into the skin daily for 14 days. 07/10/17 07/24/17  Watt Climes, PA  Multiple Vitamins-Minerals (HAIR SKIN NAILS PO) Take 1 tablet by mouth 2 (two) times daily.    [provider]  traMADol (ULTRAM) 50 MG tablet Take 1-2 tablets (50-100 mg total) by mouth every 4 (four) hours as  needed for moderate pain. Patient not taking: Reported on 03/04/2018 07/08/17   Watt Climes, PA    Allergies as of 03/10/2018 - Review Complete 03/04/2018  Allergen Reaction Noted  . Penicillins Hives and Other (See Comments) 11/09/2012  . Erythromycin Rash 11/02/2014  . Tetracyclines & related Rash 11/09/2012    Family History  Problem Relation Age of Onset  . Diabetes Mother   . Diabetes Maternal Grandfather   . Hypertension Maternal Grandfather   . Ovarian cancer Paternal Aunt   . Cancer - Lung Paternal Uncle     Social History   Socioeconomic History  . Marital status: Married    Spouse name: Not on file  . Number of children: Not on file  . Years of education: Not on file  . Highest education level: Not on file  Occupational History  . Not on file  Social Needs  . Financial resource strain: Not on file  . Food insecurity:    Worry: Not on file    Inability: Not on file  . Transportation needs:    Medical: Not on file    Non-medical: Not on file  Tobacco Use  . Smoking status: Never Smoker  . Smokeless tobacco: Never Used  Substance and Sexual Activity  . Alcohol use: No  . Drug use: No  . Sexual activity: Yes    Partners: Male    Birth control/protection: Post-menopausal    Comment: husband vasectomy  Lifestyle  . Physical activity:    Days per week: Not on file    Minutes per session: Not on file  . Stress: Not on file  Relationships  . Social connections:    Talks on phone: Not on file    Gets together: Not on file    Attends religious service: Not on file    Active member of club or organization: Not on file    Attends meetings of clubs or organizations: Not on file    Relationship status: Not on file  . Intimate partner violence:    Fear of current or ex partner: Not on file    Emotionally abused: Not on file    Physically abused: Not on file    Forced sexual activity: Not on file  Other Topics Concern  . Not on file  Social History  Narrative  . Not on file    Review of Systems: See HPI, otherwise negative ROS  Physical Exam: BP 130/83   Pulse 70   Temp (!) 97.4 F (36.3 C) (Tympanic)   Resp 20   Ht 5\' 4"  (1.626 m)   Wt 83.9 kg   LMP 02/08/2005 (Approximate)   SpO2 99%   BMI 31.76 kg/m  General:   Alert,  pleasant and cooperative in NAD Head:  Normocephalic and atraumatic. Neck:  Supple; no masses or thyromegaly. Lungs:  Clear throughout to auscultation.    Heart:  Regular rate and rhythm. Abdomen:  Soft, nontender and nondistended. Normal bowel sounds, without guarding, and without rebound.   Neurologic:  Alert and  oriented x4;  grossly normal neurologically.  Impression/Plan: Claudia Terry is here for an endoscopy and colonoscopy to be performed for FH colon polyps and follow up esophagitis. Last procedures  Were 02/23/15  Risks, benefits, limitations, and alternatives regarding  endoscopy and colonoscopy have been reviewed with the patient.  Questions have been answered.  All parties agreeable.   Gaylyn Cheers, MD  04/13/2018, 9:22 AM

## 2018-04-13 NOTE — Anesthesia Post-op Follow-up Note (Signed)
Anesthesia QCDR form completed.        

## 2018-04-13 NOTE — Anesthesia Postprocedure Evaluation (Signed)
Anesthesia Post Note  Patient: Claudia Terry  Procedure(s) Performed: COLONOSCOPY WITH PROPOFOL (N/A ) ESOPHAGOGASTRODUODENOSCOPY (EGD) WITH PROPOFOL (N/A )  Patient location during evaluation: Endoscopy Anesthesia Type: General Level of consciousness: awake and alert Pain management: pain level controlled Vital Signs Assessment: post-procedure vital signs reviewed and stable Respiratory status: spontaneous breathing, nonlabored ventilation, respiratory function stable and patient connected to nasal cannula oxygen Cardiovascular status: blood pressure returned to baseline and stable Postop Assessment: no apparent nausea or vomiting Anesthetic complications: no     Last Vitals:  Vitals:   04/13/18 1021 04/13/18 1031  BP: 119/84 119/66  Pulse: 73 72  Resp: 18 12  Temp:    SpO2: 99% 100%    Last Pain:  Vitals:   04/13/18 1031  TempSrc:   PainSc: 0-No pain                 Demarion Pondexter S

## 2018-04-14 ENCOUNTER — Encounter: Payer: Self-pay | Admitting: Unknown Physician Specialty

## 2018-04-15 LAB — SURGICAL PATHOLOGY

## 2018-05-11 IMAGING — XA DG C-ARM 1-60 MIN-NO REPORT
1 series · 1 of 1 positions shown · non-contrast
Comparison: none

[Series 1: ortho standard · 1 of 1 slices shown]
[im 1/1]
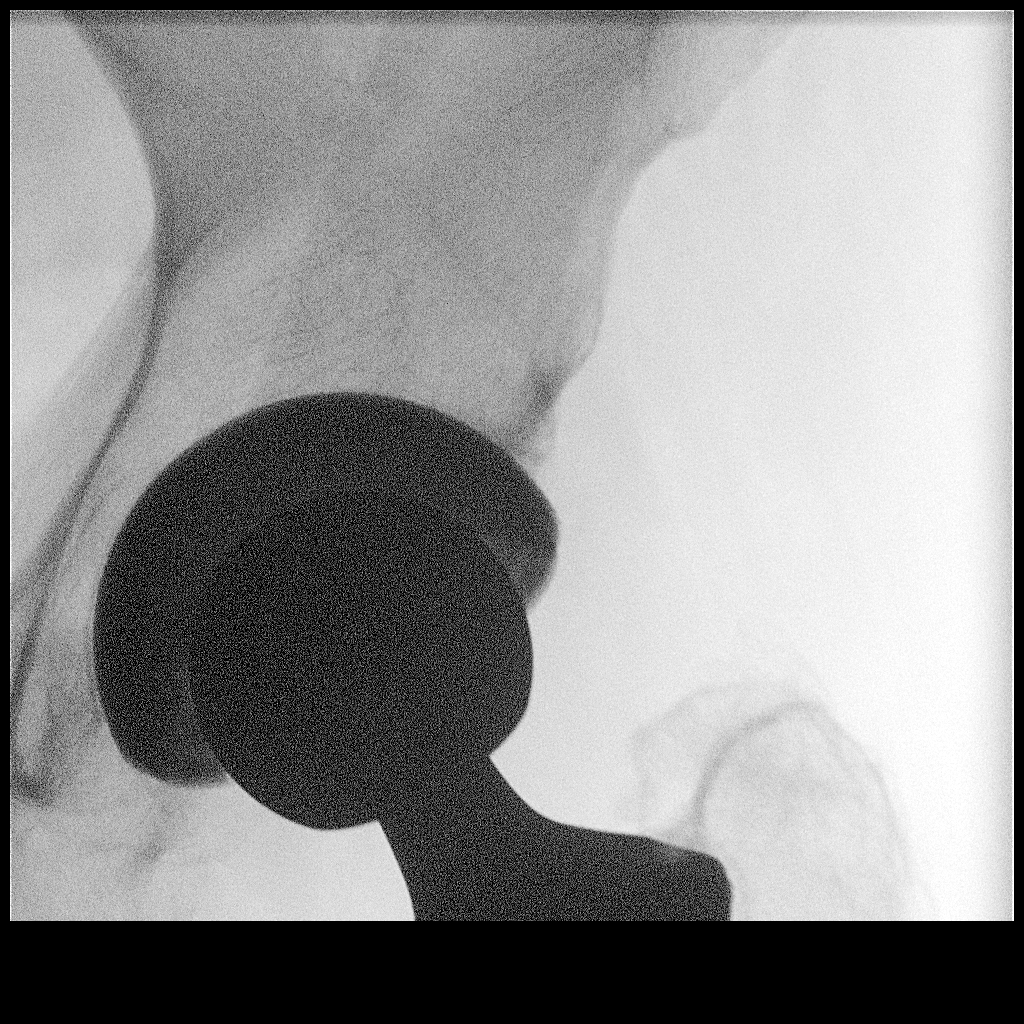

[1 of 1 positions shown; findings below may reference images not displayed]

:
Fluoroscopy was utilized by the requesting physician. No
radiographic interpretation.

## 2018-06-15 DIAGNOSIS — M21071 Valgus deformity, not elsewhere classified, right ankle: Secondary | ICD-10-CM | POA: Diagnosis not present

## 2018-06-15 DIAGNOSIS — Q66221 Congenital metatarsus adductus, right foot: Secondary | ICD-10-CM | POA: Diagnosis not present

## 2018-06-15 DIAGNOSIS — M25571 Pain in right ankle and joints of right foot: Secondary | ICD-10-CM | POA: Diagnosis not present

## 2018-06-15 DIAGNOSIS — M19072 Primary osteoarthritis, left ankle and foot: Secondary | ICD-10-CM | POA: Diagnosis not present

## 2018-06-15 DIAGNOSIS — M19071 Primary osteoarthritis, right ankle and foot: Secondary | ICD-10-CM | POA: Diagnosis not present

## 2018-06-15 DIAGNOSIS — Q66222 Congenital metatarsus adductus, left foot: Secondary | ICD-10-CM | POA: Diagnosis not present

## 2018-07-28 DIAGNOSIS — I83893 Varicose veins of bilateral lower extremities with other complications: Secondary | ICD-10-CM | POA: Diagnosis not present

## 2018-07-28 DIAGNOSIS — M159 Polyosteoarthritis, unspecified: Secondary | ICD-10-CM | POA: Diagnosis not present

## 2018-08-06 DIAGNOSIS — H40033 Anatomical narrow angle, bilateral: Secondary | ICD-10-CM | POA: Diagnosis not present

## 2018-08-06 DIAGNOSIS — H5203 Hypermetropia, bilateral: Secondary | ICD-10-CM | POA: Diagnosis not present

## 2018-08-06 DIAGNOSIS — H2513 Age-related nuclear cataract, bilateral: Secondary | ICD-10-CM | POA: Diagnosis not present

## 2018-08-06 DIAGNOSIS — H524 Presbyopia: Secondary | ICD-10-CM | POA: Diagnosis not present

## 2018-08-06 DIAGNOSIS — H52223 Regular astigmatism, bilateral: Secondary | ICD-10-CM | POA: Diagnosis not present

## 2018-09-11 DIAGNOSIS — G2581 Restless legs syndrome: Secondary | ICD-10-CM | POA: Diagnosis not present

## 2018-09-11 DIAGNOSIS — I10 Essential (primary) hypertension: Secondary | ICD-10-CM | POA: Diagnosis not present

## 2018-09-11 DIAGNOSIS — R739 Hyperglycemia, unspecified: Secondary | ICD-10-CM | POA: Diagnosis not present

## 2018-09-11 DIAGNOSIS — E7800 Pure hypercholesterolemia, unspecified: Secondary | ICD-10-CM | POA: Diagnosis not present

## 2018-09-11 DIAGNOSIS — M65351 Trigger finger, right little finger: Secondary | ICD-10-CM | POA: Diagnosis not present

## 2018-09-11 DIAGNOSIS — Z79899 Other long term (current) drug therapy: Secondary | ICD-10-CM | POA: Diagnosis not present

## 2018-09-11 DIAGNOSIS — K219 Gastro-esophageal reflux disease without esophagitis: Secondary | ICD-10-CM | POA: Diagnosis not present

## 2018-11-11 DIAGNOSIS — M25571 Pain in right ankle and joints of right foot: Secondary | ICD-10-CM | POA: Diagnosis not present

## 2018-11-11 DIAGNOSIS — Q66222 Congenital metatarsus adductus, left foot: Secondary | ICD-10-CM | POA: Diagnosis not present

## 2018-11-11 DIAGNOSIS — Q66221 Congenital metatarsus adductus, right foot: Secondary | ICD-10-CM | POA: Diagnosis not present

## 2018-11-11 DIAGNOSIS — M19072 Primary osteoarthritis, left ankle and foot: Secondary | ICD-10-CM | POA: Diagnosis not present

## 2018-11-11 DIAGNOSIS — M25572 Pain in left ankle and joints of left foot: Secondary | ICD-10-CM | POA: Diagnosis not present

## 2018-11-11 DIAGNOSIS — M19071 Primary osteoarthritis, right ankle and foot: Secondary | ICD-10-CM | POA: Diagnosis not present

## 2018-11-11 DIAGNOSIS — M21071 Valgus deformity, not elsewhere classified, right ankle: Secondary | ICD-10-CM | POA: Diagnosis not present

## 2018-12-01 DIAGNOSIS — M170 Bilateral primary osteoarthritis of knee: Secondary | ICD-10-CM | POA: Diagnosis not present

## 2018-12-01 DIAGNOSIS — S73005D Unspecified dislocation of left hip, subsequent encounter: Secondary | ICD-10-CM | POA: Diagnosis not present

## 2018-12-01 DIAGNOSIS — Z96651 Presence of right artificial knee joint: Secondary | ICD-10-CM | POA: Diagnosis not present

## 2018-12-01 DIAGNOSIS — M1611 Unilateral primary osteoarthritis, right hip: Secondary | ICD-10-CM | POA: Diagnosis not present

## 2018-12-01 DIAGNOSIS — Z96652 Presence of left artificial knee joint: Secondary | ICD-10-CM | POA: Diagnosis not present

## 2018-12-01 DIAGNOSIS — Z96653 Presence of artificial knee joint, bilateral: Secondary | ICD-10-CM | POA: Diagnosis not present

## 2018-12-01 DIAGNOSIS — Z96643 Presence of artificial hip joint, bilateral: Secondary | ICD-10-CM | POA: Diagnosis not present

## 2018-12-07 DIAGNOSIS — L239 Allergic contact dermatitis, unspecified cause: Secondary | ICD-10-CM | POA: Diagnosis not present

## 2019-01-12 DIAGNOSIS — M25512 Pain in left shoulder: Secondary | ICD-10-CM | POA: Diagnosis not present

## 2019-01-18 DIAGNOSIS — M65351 Trigger finger, right little finger: Secondary | ICD-10-CM | POA: Diagnosis not present

## 2019-01-20 DIAGNOSIS — M79672 Pain in left foot: Secondary | ICD-10-CM | POA: Diagnosis not present

## 2019-01-20 DIAGNOSIS — R2689 Other abnormalities of gait and mobility: Secondary | ICD-10-CM | POA: Diagnosis not present

## 2019-01-20 DIAGNOSIS — M79671 Pain in right foot: Secondary | ICD-10-CM | POA: Diagnosis not present

## 2019-01-20 DIAGNOSIS — M19071 Primary osteoarthritis, right ankle and foot: Secondary | ICD-10-CM | POA: Diagnosis not present

## 2019-01-20 DIAGNOSIS — M898X7 Other specified disorders of bone, ankle and foot: Secondary | ICD-10-CM | POA: Diagnosis not present

## 2019-01-20 DIAGNOSIS — M19072 Primary osteoarthritis, left ankle and foot: Secondary | ICD-10-CM | POA: Diagnosis not present

## 2019-01-25 DIAGNOSIS — M25512 Pain in left shoulder: Secondary | ICD-10-CM | POA: Diagnosis not present

## 2019-02-02 DIAGNOSIS — S46292A Other injury of muscle, fascia and tendon of other parts of biceps, left arm, initial encounter: Secondary | ICD-10-CM | POA: Diagnosis not present

## 2019-02-02 DIAGNOSIS — M13812 Other specified arthritis, left shoulder: Secondary | ICD-10-CM | POA: Diagnosis not present

## 2019-02-02 DIAGNOSIS — M75122 Complete rotator cuff tear or rupture of left shoulder, not specified as traumatic: Secondary | ICD-10-CM | POA: Diagnosis not present

## 2019-02-04 ENCOUNTER — Other Ambulatory Visit: Payer: Self-pay | Admitting: Orthopedic Surgery

## 2019-02-25 DIAGNOSIS — Z01818 Encounter for other preprocedural examination: Secondary | ICD-10-CM | POA: Diagnosis not present

## 2019-02-25 DIAGNOSIS — M75122 Complete rotator cuff tear or rupture of left shoulder, not specified as traumatic: Secondary | ICD-10-CM | POA: Diagnosis not present

## 2019-03-01 DIAGNOSIS — R739 Hyperglycemia, unspecified: Secondary | ICD-10-CM | POA: Diagnosis not present

## 2019-03-01 DIAGNOSIS — E7800 Pure hypercholesterolemia, unspecified: Secondary | ICD-10-CM | POA: Diagnosis not present

## 2019-03-01 DIAGNOSIS — M25512 Pain in left shoulder: Secondary | ICD-10-CM | POA: Diagnosis not present

## 2019-03-01 DIAGNOSIS — I10 Essential (primary) hypertension: Secondary | ICD-10-CM | POA: Diagnosis not present

## 2019-03-01 DIAGNOSIS — Z79899 Other long term (current) drug therapy: Secondary | ICD-10-CM | POA: Diagnosis not present

## 2019-03-01 DIAGNOSIS — Z01818 Encounter for other preprocedural examination: Secondary | ICD-10-CM | POA: Diagnosis not present

## 2019-03-03 ENCOUNTER — Other Ambulatory Visit: Payer: Self-pay

## 2019-03-03 ENCOUNTER — Other Ambulatory Visit
Admission: RE | Admit: 2019-03-03 | Discharge: 2019-03-03 | Disposition: A | Discharge status: Home / Self Care | Payer: PPO | Source: Ambulatory Visit | Attending: Orthopedic Surgery | Admitting: Orthopedic Surgery

## 2019-03-03 NOTE — Patient Instructions (Signed)
Your procedure is scheduled on: March 10, 2019 [facility] Report to Day Surgery on the 2nd floor of the [place]. To find out your arrival time, please call [phone] between 1PM - 3PM on: Tuesday March 09, 2019  REMEMBER: Instructions that are not followed completely may result in serious medical risk, up to and including death; or upon the discretion of your surgeon and anesthesiologist your surgery may need to be rescheduled.  Do not eat food after midnight the night before surgery.  No gum chewing, lozengers or hard candies.  You may however, drink CLEAR liquids up to 2 hours before you are scheduled to arrive for your surgery. Do not drink anything within 2 hours of the start of your surgery.  Clear liquids include: - water  - apple juice without pulp - gatorade - black coffee or tea (Do NOT add milk or creamers to the coffee or tea) Do NOT drink anything that is not on this list.  Type 1 and Type 2 diabetics should only drink water.  ENSURE PRE-SURGERY CARBOHYDRATE DRINK: FINISH DRINK AT 4:30 AM  No Alcohol for 24 hours before or after surgery.  No Smoking including e-cigarettes for 24 hours prior to surgery.  No chewable tobacco products for at least 6 hours prior to surgery.  No nicotine patches on the day of surgery.  On the morning of surgery brush your teeth with toothpaste and water, you may rinse your mouth with mouthwash if you wish. Do not swallow any toothpaste or mouthwash.  Notify your doctor if there is any change in your medical condition (cold, fever, infection).  Do not wear jewelry, make-up, hairpins, clips or nail polish.  Do not wear lotions, powders, or perfumes.   Do not shave 48 hours prior to surgery.   Contacts and dentures may not be worn into surgery.  Do not bring valuables to the hospital, including drivers license, insurance or credit cards.  [facility] is not responsible for any belongings or valuables.   TAKE THESE  MEDICATIONS THE MORNING OF SURGERY: ATENOLOL OMEPRAZOLE (take one the night before and one on the morning of surgery - helps to prevent nausea after surgery.)   Use CHG Soap as directed on instruction sheet.  Stop Anti-inflammatories (NSAIDS) such as Advil, Aleve, Ibuprofen, Motrin, Naproxen, Naprosyn and Aspirin based products such as Excedrin, Goodys Powder, BC Powder. (May take Tylenol or Acetaminophen if needed.)  Stop ANY OVER THE COUNTER supplements until after surgery COQ10  AND RED YEAST RICE (May continue Vitamin D, Vitamin B, and multivitamin.)  Wear comfortable clothing (specific to your surgery type) to the hospital.  Plan for stool softeners for home use.  If you are being discharged the day of surgery, you will not be allowed to drive home. You will need a responsible adult to drive you home and stay with you that night.   If you are taking public transportation, you will need to have a responsible adult with you. Please confirm with your physician that it is acceptable to use public transportation.   Please call [phone] if you have any questions about these instructions.

## 2019-03-05 ENCOUNTER — Encounter
Admission: RE | Admit: 2019-03-05 | Discharge: 2019-03-05 | Disposition: A | Discharge status: Home / Self Care | Payer: PPO | Source: Ambulatory Visit | Attending: Orthopedic Surgery | Admitting: Orthopedic Surgery

## 2019-03-05 DIAGNOSIS — Z20828 Contact with and (suspected) exposure to other viral communicable diseases: Secondary | ICD-10-CM | POA: Insufficient documentation

## 2019-03-05 DIAGNOSIS — Z01812 Encounter for preprocedural laboratory examination: Secondary | ICD-10-CM | POA: Insufficient documentation

## 2019-03-05 LAB — URINALYSIS, ROUTINE W REFLEX MICROSCOPIC
Bilirubin Urine: NEGATIVE
Glucose, UA: NEGATIVE mg/dL
Hgb urine dipstick: NEGATIVE
Ketones, ur: NEGATIVE mg/dL
Leukocytes,Ua: NEGATIVE
Nitrite: NEGATIVE
Protein, ur: NEGATIVE mg/dL
Specific Gravity, Urine: 1.018 (ref 1.005–1.030)
pH: 5 (ref 5.0–8.0)

## 2019-03-05 LAB — PROTIME-INR
INR: 1 (ref 0.8–1.2)
Prothrombin Time: 13.1 seconds (ref 11.4–15.2)

## 2019-03-05 LAB — APTT: aPTT: 29 seconds (ref 24–36)

## 2019-03-06 LAB — SARS CORONAVIRUS 2 (TAT 6-24 HRS): SARS Coronavirus 2: NEGATIVE

## 2019-03-09 MED ORDER — CLINDAMYCIN PHOSPHATE 900 MG/50ML IV SOLN
900.0000 mg | INTRAVENOUS | Status: AC
  Administered 2019-03-10: 900 mg via INTRAVENOUS

## 2019-03-10 ENCOUNTER — Other Ambulatory Visit: Payer: Self-pay

## 2019-03-10 ENCOUNTER — Ambulatory Visit: Payer: PPO | Admitting: Anesthesiology

## 2019-03-10 ENCOUNTER — Ambulatory Visit: Payer: PPO

## 2019-03-10 ENCOUNTER — Encounter: Payer: Self-pay | Admitting: *Deleted

## 2019-03-10 ENCOUNTER — Ambulatory Visit
Admission: RE | Admit: 2019-03-10 | Discharge: 2019-03-10 | Disposition: A | Discharge status: Home / Self Care | Payer: PPO | Attending: Orthopedic Surgery | Admitting: Orthopedic Surgery

## 2019-03-10 ENCOUNTER — Encounter
Admission: RE | Disposition: A | Discharge status: Home / Self Care | Payer: Self-pay | Source: Home / Self Care | Attending: Orthopedic Surgery

## 2019-03-10 ENCOUNTER — Ambulatory Visit: Payer: PPO | Admitting: Obstetrics and Gynecology

## 2019-03-10 DIAGNOSIS — M75122 Complete rotator cuff tear or rupture of left shoulder, not specified as traumatic: Secondary | ICD-10-CM | POA: Insufficient documentation

## 2019-03-10 DIAGNOSIS — K2970 Gastritis, unspecified, without bleeding: Secondary | ICD-10-CM | POA: Insufficient documentation

## 2019-03-10 DIAGNOSIS — G4730 Sleep apnea, unspecified: Secondary | ICD-10-CM | POA: Insufficient documentation

## 2019-03-10 DIAGNOSIS — K210 Gastro-esophageal reflux disease with esophagitis: Secondary | ICD-10-CM | POA: Diagnosis not present

## 2019-03-10 DIAGNOSIS — I10 Essential (primary) hypertension: Secondary | ICD-10-CM | POA: Diagnosis not present

## 2019-03-10 DIAGNOSIS — Z96643 Presence of artificial hip joint, bilateral: Secondary | ICD-10-CM | POA: Insufficient documentation

## 2019-03-10 DIAGNOSIS — Z96653 Presence of artificial knee joint, bilateral: Secondary | ICD-10-CM | POA: Insufficient documentation

## 2019-03-10 DIAGNOSIS — X58XXXA Exposure to other specified factors, initial encounter: Secondary | ICD-10-CM | POA: Diagnosis not present

## 2019-03-10 DIAGNOSIS — M722 Plantar fascial fibromatosis: Secondary | ICD-10-CM | POA: Insufficient documentation

## 2019-03-10 DIAGNOSIS — Z880 Allergy status to penicillin: Secondary | ICD-10-CM | POA: Insufficient documentation

## 2019-03-10 DIAGNOSIS — M719 Bursopathy, unspecified: Secondary | ICD-10-CM | POA: Diagnosis not present

## 2019-03-10 DIAGNOSIS — S46292A Other injury of muscle, fascia and tendon of other parts of biceps, left arm, initial encounter: Secondary | ICD-10-CM | POA: Diagnosis not present

## 2019-03-10 DIAGNOSIS — E785 Hyperlipidemia, unspecified: Secondary | ICD-10-CM | POA: Diagnosis not present

## 2019-03-10 DIAGNOSIS — G2581 Restless legs syndrome: Secondary | ICD-10-CM | POA: Diagnosis not present

## 2019-03-10 DIAGNOSIS — K222 Esophageal obstruction: Secondary | ICD-10-CM | POA: Insufficient documentation

## 2019-03-10 DIAGNOSIS — Z79899 Other long term (current) drug therapy: Secondary | ICD-10-CM | POA: Insufficient documentation

## 2019-03-10 DIAGNOSIS — S46112A Strain of muscle, fascia and tendon of long head of biceps, left arm, initial encounter: Secondary | ICD-10-CM | POA: Diagnosis not present

## 2019-03-10 DIAGNOSIS — I8393 Asymptomatic varicose veins of bilateral lower extremities: Secondary | ICD-10-CM | POA: Diagnosis not present

## 2019-03-10 DIAGNOSIS — S43432A Superior glenoid labrum lesion of left shoulder, initial encounter: Secondary | ICD-10-CM | POA: Insufficient documentation

## 2019-03-10 DIAGNOSIS — M65812 Other synovitis and tenosynovitis, left shoulder: Secondary | ICD-10-CM | POA: Diagnosis not present

## 2019-03-10 DIAGNOSIS — Z8371 Family history of colonic polyps: Secondary | ICD-10-CM | POA: Diagnosis not present

## 2019-03-10 DIAGNOSIS — Z881 Allergy status to other antibiotic agents status: Secondary | ICD-10-CM | POA: Diagnosis not present

## 2019-03-10 DIAGNOSIS — G8918 Other acute postprocedural pain: Secondary | ICD-10-CM | POA: Diagnosis not present

## 2019-03-10 DIAGNOSIS — K449 Diaphragmatic hernia without obstruction or gangrene: Secondary | ICD-10-CM | POA: Diagnosis not present

## 2019-03-10 DIAGNOSIS — M66812 Spontaneous rupture of other tendons, left shoulder: Secondary | ICD-10-CM | POA: Diagnosis not present

## 2019-03-10 DIAGNOSIS — M7542 Impingement syndrome of left shoulder: Secondary | ICD-10-CM | POA: Diagnosis not present

## 2019-03-10 DIAGNOSIS — M13812 Other specified arthritis, left shoulder: Secondary | ICD-10-CM | POA: Diagnosis not present

## 2019-03-10 DIAGNOSIS — M25512 Pain in left shoulder: Secondary | ICD-10-CM | POA: Diagnosis not present

## 2019-03-10 DIAGNOSIS — M19012 Primary osteoarthritis, left shoulder: Secondary | ICD-10-CM | POA: Diagnosis not present

## 2019-03-10 HISTORY — PX: SHOULDER ARTHROSCOPY WITH OPEN ROTATOR CUFF REPAIR: SHX6092

## 2019-03-10 SURGERY — ARTHROSCOPY, SHOULDER WITH REPAIR, ROTATOR CUFF, OPEN
Anesthesia: General | Site: Shoulder | Laterality: Left

## 2019-03-10 MED ORDER — METOCLOPRAMIDE HCL 5 MG/ML IJ SOLN: 5.0000 mg | Freq: Three times a day (TID) | INTRAMUSCULAR | Status: DC | PRN

## 2019-03-10 MED ORDER — ACETAMINOPHEN 325 MG PO TABS: 325.0000 mg | ORAL_TABLET | Freq: Four times a day (QID) | ORAL | Status: DC | PRN

## 2019-03-10 MED ORDER — ONDANSETRON HCL 4 MG/2ML IJ SOLN
INTRAMUSCULAR | Status: DC | PRN
  Administered 2019-03-10: 4 mg via INTRAVENOUS

## 2019-03-10 MED ORDER — FENTANYL CITRATE (PF) 100 MCG/2ML IJ SOLN: 25.0000 ug | INTRAMUSCULAR | Status: DC | PRN

## 2019-03-10 MED ORDER — DOCUSATE SODIUM 100 MG PO CAPS
100.0000 mg | ORAL_CAPSULE | Freq: Two times a day (BID) | ORAL | Status: DC
  Filled 2019-03-10: qty 1

## 2019-03-10 MED ORDER — ACETAMINOPHEN 10 MG/ML IV SOLN
INTRAVENOUS | Status: DC | PRN
  Administered 2019-03-10: 1000 mg via INTRAVENOUS

## 2019-03-10 MED ORDER — PROPOFOL 10 MG/ML IV BOLUS
INTRAVENOUS | Status: DC | PRN
  Administered 2019-03-10: 50 mg via INTRAVENOUS
  Administered 2019-03-10: 100 mg via INTRAVENOUS

## 2019-03-10 MED ORDER — ONDANSETRON HCL 4 MG/2ML IJ SOLN: 4.0000 mg | Freq: Four times a day (QID) | INTRAMUSCULAR | Status: DC | PRN

## 2019-03-10 MED ORDER — HYDROCODONE-ACETAMINOPHEN 5-325 MG PO TABS: 1.0000 | ORAL_TABLET | ORAL | Status: DC | PRN

## 2019-03-10 MED ORDER — ROCURONIUM BROMIDE 100 MG/10ML IV SOLN
INTRAVENOUS | Status: DC | PRN
  Administered 2019-03-10: 40 mg via INTRAVENOUS

## 2019-03-10 MED ORDER — HYDROCODONE-ACETAMINOPHEN 5-325 MG PO TABS: 1.0000 | ORAL_TABLET | ORAL | 0 refills | Status: DC | PRN

## 2019-03-10 MED ORDER — BUPIVACAINE LIPOSOME 1.3 % IJ SUSP
INTRAMUSCULAR | Status: AC
  Filled 2019-03-10: qty 20

## 2019-03-10 MED ORDER — DOCUSATE SODIUM 100 MG PO CAPS: 100.0000 mg | ORAL_CAPSULE | Freq: Every day | ORAL | 2 refills | Status: AC | PRN

## 2019-03-10 MED ORDER — BUPIVACAINE HCL (PF) 0.25 % IJ SOLN
INTRAMUSCULAR | Status: AC
  Filled 2019-03-10: qty 30

## 2019-03-10 MED ORDER — DEXAMETHASONE SODIUM PHOSPHATE 10 MG/ML IJ SOLN
INTRAMUSCULAR | Status: DC | PRN
  Administered 2019-03-10: 5 mg via INTRAVENOUS

## 2019-03-10 MED ORDER — LIDOCAINE HCL (PF) 1 % IJ SOLN
INTRAMUSCULAR | Status: AC
  Filled 2019-03-10: qty 5

## 2019-03-10 MED ORDER — MIDAZOLAM HCL 2 MG/2ML IJ SOLN
1.0000 mg | Freq: Once | INTRAMUSCULAR | Status: AC
  Administered 2019-03-10: 07:00:00 1 mg via INTRAVENOUS

## 2019-03-10 MED ORDER — LIDOCAINE HCL (CARDIAC) PF 100 MG/5ML IV SOSY
PREFILLED_SYRINGE | INTRAVENOUS | Status: DC | PRN
  Administered 2019-03-10: 60 mg via INTRAVENOUS

## 2019-03-10 MED ORDER — MIDAZOLAM HCL 2 MG/2ML IJ SOLN
INTRAMUSCULAR | Status: AC
  Administered 2019-03-10: 1 mg via INTRAVENOUS
  Filled 2019-03-10: qty 2

## 2019-03-10 MED ORDER — BUPIVACAINE HCL (PF) 0.5 % IJ SOLN
INTRAMUSCULAR | Status: DC | PRN
  Administered 2019-03-10: 10 mL via PERINEURAL

## 2019-03-10 MED ORDER — KETOROLAC TROMETHAMINE 15 MG/ML IJ SOLN
15.0000 mg | Freq: Four times a day (QID) | INTRAMUSCULAR | Status: DC
  Administered 2019-03-10: 15 mg via INTRAVENOUS
  Filled 2019-03-10: qty 1

## 2019-03-10 MED ORDER — ONDANSETRON HCL 4 MG/2ML IJ SOLN: 4.0000 mg | Freq: Once | INTRAMUSCULAR | Status: DC | PRN

## 2019-03-10 MED ORDER — LACTATED RINGERS IV SOLN: INTRAVENOUS | Status: DC

## 2019-03-10 MED ORDER — METOCLOPRAMIDE HCL 10 MG PO TABS: 5.0000 mg | ORAL_TABLET | Freq: Three times a day (TID) | ORAL | Status: DC | PRN

## 2019-03-10 MED ORDER — EPINEPHRINE PF 1 MG/ML IJ SOLN
INTRAMUSCULAR | Status: AC
  Filled 2019-03-10: qty 4

## 2019-03-10 MED ORDER — ACETAMINOPHEN NICU IV SYRINGE 10 MG/ML
INTRAVENOUS | Status: AC
  Filled 2019-03-10: qty 1

## 2019-03-10 MED ORDER — PHENOL 1.4 % MT LIQD
1.0000 | OROMUCOSAL | Status: DC | PRN
  Filled 2019-03-10: qty 177

## 2019-03-10 MED ORDER — BUPIVACAINE HCL (PF) 0.5 % IJ SOLN
INTRAMUSCULAR | Status: AC
  Filled 2019-03-10: qty 10

## 2019-03-10 MED ORDER — SUGAMMADEX SODIUM 200 MG/2ML IV SOLN
INTRAVENOUS | Status: DC | PRN
  Administered 2019-03-10: 200 mg via INTRAVENOUS

## 2019-03-10 MED ORDER — LACTATED RINGERS IV SOLN
INTRAVENOUS | Status: DC | PRN
  Administered 2019-03-10: 2 mL

## 2019-03-10 MED ORDER — FENTANYL CITRATE (PF) 250 MCG/5ML IJ SOLN
INTRAMUSCULAR | Status: AC
  Filled 2019-03-10: qty 5

## 2019-03-10 MED ORDER — PROPOFOL 10 MG/ML IV BOLUS
INTRAVENOUS | Status: AC
  Filled 2019-03-10: qty 20

## 2019-03-10 MED ORDER — FENTANYL CITRATE (PF) 100 MCG/2ML IJ SOLN
50.0000 ug | Freq: Once | INTRAMUSCULAR | Status: AC
  Administered 2019-03-10: 07:00:00 50 ug via INTRAVENOUS

## 2019-03-10 MED ORDER — EPINEPHRINE PF 1 MG/ML IJ SOLN
INTRAMUSCULAR | Status: AC
  Filled 2019-03-10: qty 1

## 2019-03-10 MED ORDER — MIDAZOLAM HCL 2 MG/2ML IJ SOLN
INTRAMUSCULAR | Status: DC | PRN
  Administered 2019-03-10: 2 mg via INTRAVENOUS

## 2019-03-10 MED ORDER — BUPIVACAINE LIPOSOME 1.3 % IJ SUSP
INTRAMUSCULAR | Status: DC | PRN
  Administered 2019-03-10: 20 mL via PERINEURAL

## 2019-03-10 MED ORDER — PHENYLEPHRINE HCL (PRESSORS) 10 MG/ML IV SOLN
INTRAVENOUS | Status: DC | PRN
  Administered 2019-03-10 (×14): 100 ug via INTRAVENOUS

## 2019-03-10 MED ORDER — MORPHINE SULFATE (PF) 4 MG/ML IV SOLN: 0.5000 mg | INTRAVENOUS | Status: DC | PRN

## 2019-03-10 MED ORDER — CLINDAMYCIN PHOSPHATE 900 MG/50ML IV SOLN
INTRAVENOUS | Status: AC
  Filled 2019-03-10: qty 50

## 2019-03-10 MED ORDER — FENTANYL CITRATE (PF) 100 MCG/2ML IJ SOLN
INTRAMUSCULAR | Status: AC
  Administered 2019-03-10: 50 ug via INTRAVENOUS
  Filled 2019-03-10: qty 2

## 2019-03-10 MED ORDER — LIDOCAINE HCL (PF) 1 % IJ SOLN
INTRAMUSCULAR | Status: DC | PRN
  Administered 2019-03-10: .9 mL via SUBCUTANEOUS

## 2019-03-10 MED ORDER — KETOROLAC TROMETHAMINE 15 MG/ML IJ SOLN
INTRAMUSCULAR | Status: AC
  Filled 2019-03-10: qty 1

## 2019-03-10 MED ORDER — ONDANSETRON HCL 4 MG PO TABS: 4.0000 mg | ORAL_TABLET | Freq: Four times a day (QID) | ORAL | Status: DC | PRN

## 2019-03-10 MED ORDER — MENTHOL 3 MG MT LOZG
1.0000 | LOZENGE | OROMUCOSAL | Status: DC | PRN
  Filled 2019-03-10: qty 9

## 2019-03-10 MED ORDER — MIDAZOLAM HCL 2 MG/2ML IJ SOLN
INTRAMUSCULAR | Status: AC
  Filled 2019-03-10: qty 2

## 2019-03-10 MED ORDER — FENTANYL CITRATE (PF) 100 MCG/2ML IJ SOLN
INTRAMUSCULAR | Status: DC | PRN
  Administered 2019-03-10 (×3): 50 ug via INTRAVENOUS
  Administered 2019-03-10: 100 ug via INTRAVENOUS

## 2019-03-10 MED ORDER — CHLORHEXIDINE GLUCONATE 4 % EX LIQD: 60.0000 mL | Freq: Once | CUTANEOUS | Status: DC

## 2019-03-10 MED ORDER — ROCURONIUM BROMIDE 50 MG/5ML IV SOLN
INTRAVENOUS | Status: AC
  Filled 2019-03-10: qty 1

## 2019-03-10 MED ORDER — HYDROCODONE-ACETAMINOPHEN 7.5-325 MG PO TABS
1.0000 | ORAL_TABLET | ORAL | Status: DC | PRN
  Filled 2019-03-10: qty 2

## 2019-03-10 MED ORDER — LACTATED RINGERS IV SOLN
INTRAVENOUS | Status: DC
  Administered 2019-03-10: 07:00:00 via INTRAVENOUS

## 2019-03-10 SURGICAL SUPPLY — 75 items
ADAPTER IRRIG TUBE 2 SPIKE SOL (ADAPTER) ×4 IMPLANT
ADPR TBG 2 SPK PMP STRL ASCP (ADAPTER) ×1
ANCH SUT 2 4.75 2 THRD PEEK (Anchor) ×1 IMPLANT
ANCHOR SUT CROSSFT 4.75 (Anchor) ×2 IMPLANT
APL PRP STRL LF DISP 70% ISPRP (MISCELLANEOUS) ×1
BLADE FULL RADIUS 3.5 (BLADE) ×3 IMPLANT
BLADE INCISOR PLUS 4.5 (BLADE) ×3 IMPLANT
BLADE SURG MINI STRL (BLADE) ×3 IMPLANT
BRUSH SCRUB EZ  4% CHG (MISCELLANEOUS) ×2
BRUSH SCRUB EZ 4% CHG (MISCELLANEOUS) ×1 IMPLANT
BUR BR 5.5 WIDE MOUTH (BURR) ×2 IMPLANT
CANNULA 5.75X7 CRYSTAL CLEAR (CANNULA) ×3 IMPLANT
CANNULA PARTIAL THREAD 2X7 (CANNULA) IMPLANT
CANNULA TWIST IN 8.25X7CM (CANNULA) ×2 IMPLANT
CANNULA TWIST IN 8.25X9CM (CANNULA) ×3 IMPLANT
CHLORAPREP W/TINT 26 (MISCELLANEOUS) ×3 IMPLANT
CLOSURE WOUND 1/4X4 (GAUZE/BANDAGES/DRESSINGS)
COOLER POLAR GLACIER W/PUMP (MISCELLANEOUS) ×3 IMPLANT
COVER WAND RF STERILE (DRAPES) ×3 IMPLANT
CRADLE LAMINECT ARM (MISCELLANEOUS) ×3 IMPLANT
DEVICE SUCT BLK HOLE OR FLOOR (MISCELLANEOUS) ×3 IMPLANT
DRAPE 3/4 80X56 (DRAPES) ×3 IMPLANT
DRAPE IMP U-DRAPE 54X76 (DRAPES) ×4 IMPLANT
DRAPE INCISE IOBAN 66X45 STRL (DRAPES) ×3 IMPLANT
DRAPE SPLIT 6X30 W/TAPE (DRAPES) ×2 IMPLANT
DRAPE STERI 35X30 U-POUCH (DRAPES) ×3 IMPLANT
DRAPE U-SHAPE 47X51 STRL (DRAPES) ×3 IMPLANT
ELECT REM PT RETURN 9FT ADLT (ELECTROSURGICAL)
ELECTRODE REM PT RTRN 9FT ADLT (ELECTROSURGICAL) ×1 IMPLANT
GAUZE 4X4 16PLY RFD (DISPOSABLE) IMPLANT
GAUZE SPONGE 4X4 12PLY STRL (GAUZE/BANDAGES/DRESSINGS) ×3 IMPLANT
GAUZE XEROFORM 1X8 LF (GAUZE/BANDAGES/DRESSINGS) ×3 IMPLANT
GLOVE BIOGEL PI IND STRL 8 (GLOVE) ×1 IMPLANT
GLOVE BIOGEL PI INDICATOR 8 (GLOVE) ×2
GLOVE SURG ORTHO 8.0 STRL STRW (GLOVE) ×3 IMPLANT
GOWN STRL REUS W/ TWL LRG LVL3 (GOWN DISPOSABLE) ×1 IMPLANT
GOWN STRL REUS W/ TWL XL LVL3 (GOWN DISPOSABLE) ×1 IMPLANT
GOWN STRL REUS W/TWL LRG LVL3 (GOWN DISPOSABLE) ×3
GOWN STRL REUS W/TWL XL LVL3 (GOWN DISPOSABLE) ×3
IV LACTATED RINGER IRRG 3000ML (IV SOLUTION) ×15
IV LR IRRIG 3000ML ARTHROMATIC (IV SOLUTION) ×6 IMPLANT
KIT STABILIZATION SHOULDER (MISCELLANEOUS) ×3 IMPLANT
KIT TURNOVER KIT A (KITS) ×3 IMPLANT
MANIFOLD NEPTUNE II (INSTRUMENTS) ×6 IMPLANT
MASK FACE SPIDER DISP (MASK) ×3 IMPLANT
MAT ABSORB  FLUID 56X50 GRAY (MISCELLANEOUS) ×2
MAT ABSORB FLUID 56X50 GRAY (MISCELLANEOUS) ×1 IMPLANT
NDL SAFETY ECLIPSE 18X1.5 (NEEDLE) ×1 IMPLANT
NDL SCORPION MULTI FIRE (NEEDLE) IMPLANT
NDL SPNL 18GX3.5 QUINCKE PK (NEEDLE) ×1 IMPLANT
NEEDLE HYPO 18GX1.5 SHARP (NEEDLE) ×3
NEEDLE HYPO 22GX1.5 SAFETY (NEEDLE) ×3 IMPLANT
NEEDLE SCORPION MULTI FIRE (NEEDLE) ×3 IMPLANT
NEEDLE SPNL 18GX3.5 QUINCKE PK (NEEDLE) ×3 IMPLANT
PACK ARTHROSCOPY SHOULDER (MISCELLANEOUS) ×3 IMPLANT
PAD ABD DERMACEA PRESS 5X9 (GAUZE/BANDAGES/DRESSINGS) IMPLANT
PAD WRAPON POLAR SHDR XLG (MISCELLANEOUS) ×1 IMPLANT
SLING ARM LRG DEEP (SOFTGOODS) IMPLANT
SLING ULTRA II LG (MISCELLANEOUS) ×2 IMPLANT
STRAP SAFETY 5IN WIDE (MISCELLANEOUS) ×3 IMPLANT
STRIP CLOSURE SKIN 1/4X4 (GAUZE/BANDAGES/DRESSINGS) IMPLANT
SUT ETHILON NAB PS2 4-0 18IN (SUTURE) ×3 IMPLANT
SUT FIBERWIRE #2 38 T-5 BLUE (SUTURE)
SUT PDS AB 0 CT1 27 (SUTURE) ×3 IMPLANT
SUT TIGER TAPE 7 IN WHITE (SUTURE) IMPLANT
SUTURE FIBERWR #2 38 T-5 BLUE (SUTURE) IMPLANT
SYR 10ML LL (SYRINGE) ×3 IMPLANT
SYR 50ML LL SCALE MARK (SYRINGE) ×3 IMPLANT
TAPE HI-FI 2MM BLUE (SUTURE) ×2 IMPLANT
TAPE MICROFOAM 4IN (TAPE) ×3 IMPLANT
TUBING ARTHRO INFLOW-ONLY STRL (TUBING) ×3 IMPLANT
TUBING CONNECTING 10 (TUBING) ×2 IMPLANT
TUBING CONNECTING 10' (TUBING) ×1
WAND WEREWOLF FLOW 90D (MISCELLANEOUS) ×3 IMPLANT
WRAPON POLAR PAD SHDR XLG (MISCELLANEOUS) ×3

## 2019-03-10 NOTE — Op Note (Addendum)
03/10/2019  10:20 AM  PATIENT:  Claudia Terry  73 y.o. female  PRE-OPERATIVE DIAGNOSIS:  M75.122 complete rotator cuff tear or rupture of left shoulder M13.812 other specified arthritis, left shoulder S46.292A other injury of muscle, fascia and tendon of other parts of biceps, left arm  POST-OPERATIVE DIAGNOSIS:  LEFT ROTATOR CUFF TEAR, IMPINGEMENT, AC ARTHRITIS, BICEPS TEAR, LABRAL TEAR  PROCEDURE:  Procedure(s): LEFT SHOULDER ARTHROSCOPY, ARTHROSCOPIC ROTATOR CUFF REPAIR, DISTAL CLAVICLE EXCISION, SUBACROMIAL DECOMPRESSION, INTRA-ARTICULAR DEBRIDEMENT  (Left)  SURGEON:  Surgeon(s) and Role:    Lovell Sheehan, MD - Primary  ASSIST: Carlynn Spry, PA-C  ANESTHESIA:   regional and general   PREOPERATIVE INDICATIONS:  Claudia Terry is a  73 y.o. female with a diagnosis of M75.122 complete rotator cuff tear or rupture of left shoulder M13.812 other specified arthritis, left shoulder [phone] other injury of muscle, fascia and tendon of other parts of biceps, left arm who failed conservative measures and elected for surgical management.    The risks benefits and alternatives were discussed with the patient preoperatively including but not limited to the risks of infection, bleeding, nerve injury, persistent pain or weakness, failure of the hardware, re-tear of the rotator cuff and the need for further surgery. Medical risks include DVT and pulmonary embolism, myocardial infarction, stroke, pneumonia, respiratory failure and death. Patient understood these risks and wished to proceed.  OPERATIVE IMPLANTS: Conmed System  OPERATIVE PROCEDURE: The patient was met in the preoperative area. The left shoulder was signed with my initials according the hospital's correct site of surgery protocol. The patient is brought to the OR and underwent a supraclavicular block and general endotracheal intubation by the anesthesia service.  The patient was placed in a beachchair position.  A spider arm  positioner was used for this case. Examination under anesthesia revealed negative sulcus and no anterior or posterior instability.  The patient was prepped and draped in a sterile fashion. A timeout was performed to verify the patient's name, date of birth, medical record number, correct site of surgery and correct procedure to be performed there was also used to verify the patient received antibiotics that all appropriate instruments, implants and radiographs studies were available in the room. Once all in attendance were in agreement case began.  Bony landmarks were drawn out with a surgical marker along with proposed arthroscopy incisions. These were pre-injected with 1% lidocaine plain. An 11 blade was used to establish a posterior portal through which the arthroscope was placed in the glenohumeral joint. A full diagnostic examination of the shoulder was performed.  The anterior portal was established under direct visualization with an 18-gauge spinal needle.  A 5.75 mm arthroscopic cannula was placed through the anterior portal.   The intra-articular portion of the biceps tendon was found to have a partial tear involving greater than 90% of the diameter. Therefore the decision was made to perform a tenotomy. An arthroscopic shaver and wand was used to debride the remaining biceps. The arthroscopic shaver was then used to debride the frayed edges of the labrum circumfrentially. There were no anterior or superior labral tears seen.  Subscapularis tendon was partially torn at the insertion and this was debrided with the shaver. Patient had a massive full-thickness tear involving the supraspinatus and infraspinatus with retraction medial to the glenoid. There were no loose bodies within the inferior recess and no evidence of HAGL lesion. There was severe synovitis in the inferior pouch which was debrided with the shaver.  The arthroscope  was then placed in the subacromial space. A lateral portal was then  established using an 18-gauge spinal needle for localization.   The greater tuberosity was debrided using a 5.5 mm resector shaver blade to remove all remaining foreign fibers of the rotator cuff.  Debridement was performed until punctate bleeding was seen at the greater tuberosity footprint, which will allow for rotator cuff healing.  Extensive bursitis was encountered and debrided using a 4-0 resector shaver blade and a 90 ArthroCare wand from the lateral portal. Using a lateral knotless anchor and fibertape a partial repair was performed of the infraspinatus. The cuff was mobilized and brought laterally and anteriorly and the tape passed through the rotator cuff. The tape was then  fixated on the lateral side with a SwiveLock anchor. An additional repair was performed with the safety stitch and tied. The final construct was stable and moved as a unit with improved coverage of the humeral head. The supraspinatus was not repairable.  Using the burr and arthroscopic wand a limited subacromial decompression was performed. Again, using the burr approximately 8 mm of distal clavicle was resected. Hemostasis was achieved with electrocautery.  All incisions were copiously irrigated. Skin closure for the arthroscopic incisions was performed with 4-0 nylon.  A dry sterile dressing including Steri-Strips was applied .  The patient was placed in an abduction sling.  All sharp and instrument counts were correct at the conclusion of the case. I was scrubbed and present for the entire case. I spoke with the patient's family in the post-op consultation room and informed them that the case had been performed without complication and the patient was stable in recovery room.   Kurtis Bushman, MD

## 2019-03-10 NOTE — Anesthesia Postprocedure Evaluation (Signed)
Anesthesia Post Note  Patient: Claudia Terry  Procedure(s) Performed: LEFT SHOULDER ARTHROSCOPY, ARTHROSCOPIC ROTATOR CUFF REPAIR, DISTAL CLAVICLE EXCISION, SUBACROMIAL DECOMPRESSION, INTRA-ARTICULAR DEBRIDEMENT  (Left Shoulder)  Patient location during evaluation: PACU Anesthesia Type: General Level of consciousness: awake and alert Pain management: pain level controlled Vital Signs Assessment: post-procedure vital signs reviewed and stable Respiratory status: spontaneous breathing and respiratory function stable Cardiovascular status: stable Anesthetic complications: no     Last Vitals:  Vitals:   03/10/19 1040 03/10/19 1056  BP: (!) 118/57 116/70  Pulse: 70 64  Resp: 15 (!) 8  Temp:    SpO2: 99% 97%    Last Pain:  Vitals:   03/10/19 1056  TempSrc:   PainSc: 0-No pain                 Karmine Kauer K

## 2019-03-10 NOTE — Transfer of Care (Signed)
Immediate Anesthesia Transfer of Care Note  Patient: Kenetha Gilleran Bechtel  Procedure(s) Performed: LEFT SHOULDER ARTHROSCOPY, ARTHROSCOPIC ROTATOR CUFF REPAIR, DISTAL CLAVICLE EXCISION, SUBACROMIAL DECOMPRESSION, INTRA-ARTICULAR DEBRIDEMENT  (Left Shoulder)  Patient Location: PACU  Anesthesia Type:General  Level of Consciousness: awake, alert  and oriented  Airway & Oxygen Therapy: Patient Spontanous Breathing and Patient connected to face mask oxygen  Post-op Assessment: Report given to RN and Post -op Vital signs reviewed and stable  Post vital signs: Reviewed and stable  Last Vitals:  Vitals Value Taken Time  BP 119/64 03/10/19 1025  Temp 36.1 C 03/10/19 1025  Pulse 76 03/10/19 1027  Resp 21 03/10/19 1027  SpO2 99 % 03/10/19 1027  Vitals shown include unvalidated device data.  Last Pain:  Vitals:   03/10/19 0629  TempSrc: Temporal  PainSc: 5       Patients Stated Pain Goal: 0 ([id number])  Complications: No apparent anesthesia complications

## 2019-03-10 NOTE — H&P (Signed)
The patient has been re-examined, and the chart reviewed, and there have been no interval changes to the documented history and physical.  Plan a left shoulder arthroscopic rotator cuff repair today.  Anesthesia is consulted regarding a peripheral nerve block for post-operative pain.  The risks, benefits, and alternatives have been discussed at length, and the patient is willing to proceed.

## 2019-03-10 NOTE — Anesthesia Post-op Follow-up Note (Signed)
Anesthesia QCDR form completed.        

## 2019-03-10 NOTE — Anesthesia Procedure Notes (Signed)
Anesthesia Regional Block: Interscalene brachial plexus block   Pre-Anesthetic Checklist: ,, timeout performed, Correct Patient, Correct Site, Correct Laterality, Correct Procedure, Correct Position, site marked, Risks and benefits discussed,  Surgical consent,  Pre-op evaluation,  At surgeon's request and post-op pain management  Laterality: Left  Prep: chloraprep       Needles:  Injection technique: Single-shot  Needle Type: Stimiplex     Needle Length: 9cm  Needle Gauge: 22     Additional Needles:   Procedures:, nerve stimulator,,, ultrasound used (permanent image in chart),,,,   Nerve Stimulator or Paresthesia:  Response: biceps flexion, 0.8 mA,   Additional Responses:   Narrative:  Start time: 03/10/2019 7:05 AM End time: 03/10/2019 7:14 AM Injection made incrementally with aspirations every 5 mL.  Performed by: Personally   Additional Notes: Functioning IV was confirmed and monitors were applied.  A 7mm 22ga Stimuplex needle was used. Sterile prep and drape,hand hygiene and sterile gloves were used.  Negative aspiration and negative test dose prior to incremental administration of local anesthetic. The patient tolerated the procedure well.

## 2019-03-10 NOTE — Anesthesia Preprocedure Evaluation (Signed)
Anesthesia Evaluation  Patient identified by MRN, date of birth, ID band Patient awake    Reviewed: Allergy & Precautions, NPO status , Unable to perform ROS - Chart review only  History of Anesthesia Complications Negative for: history of anesthetic complications  Airway Mallampati: III       Dental   Pulmonary neg sleep apnea, neg COPD, Not current smoker,           Cardiovascular hypertension, Pt. on medications and Pt. on home beta blockers (-) Past MI and (-) CHF (-) dysrhythmias (-) Valvular Problems/Murmurs     Neuro/Psych neg Seizures    GI/Hepatic Neg liver ROS, GERD  Medicated and Controlled,  Endo/Other  neg diabetes  Renal/GU negative Renal ROS     Musculoskeletal   Abdominal   Peds  Hematology   Anesthesia Other Findings   Reproductive/Obstetrics                             Anesthesia Physical Anesthesia Plan  ASA: II  Anesthesia Plan: General   Post-op Pain Management: GA combined w/ Regional for post-op pain   Induction: Intravenous  PONV Risk Score and Plan: 3 and Propofol infusion, TIVA and Midazolam  Airway Management Planned: Oral ETT  Additional Equipment:   Intra-op Plan:   Post-operative Plan:   Informed Consent: I have reviewed the patients History and Physical, chart, labs and discussed the procedure including the risks, benefits and alternatives for the proposed anesthesia with the patient or authorized representative who has indicated his/her understanding and acceptance.       Plan Discussed with:   Anesthesia Plan Comments:         Anesthesia Quick Evaluation

## 2019-03-10 NOTE — Discharge Instructions (Signed)
Wear sling at all times, including sleep.  You will need to use the sling for a total of 4 weeks following surgery.  Do not try and lift your arm up or away from your body for any reason.   Keep the dressing dry.  You may remove bandage in 3 days.  You may place Band-Aids over top of the incisions.  May shower once dressing is removed in 3 days.  Remove sling carefully only for showers, leaving arm down by your side while in the shower.  +++ Make sure to take some pain medication this evening before you fall asleep, in preparation for the nerve block wearing off in the middle of the night.  If the the pain medication causes itching, or is too strong, try taking a single tablet at a time, or combining with Benadryl.  You may be most comfortable sleeping in a recliner.  If you do sleep in near bed, placed pillows behind the shoulder that have the operation to support it.   AMBULATORY SURGERY  DISCHARGE INSTRUCTIONS   1) The drugs that you were given will stay in your system until tomorrow so for the next 24 hours you should not:  A) Drive an automobile B) Make any legal decisions C) Drink any alcoholic beverage   2) You may resume regular meals tomorrow.  Today it is better to start with liquids and gradually work up to solid foods.  You may eat anything you prefer, but it is better to start with liquids, then soup and crackers, and gradually work up to solid foods.   3) Please notify your doctor immediately if you have any unusual bleeding, trouble breathing, redness and pain at the surgery site, drainage, fever, or pain not relieved by medication.    4) Additional Instructions:        Please contact your physician with any problems or Same Day Surgery at [phone], Monday through Friday 6 am to 4 pm, or [place] at [facility] number at [phone].      Interscalene Nerve Block with Exparel  1.  For your surgery you have received an Interscalene Nerve Block  with Exparel. 2. Nerve Blocks affect many types of nerves, including nerves that control movement, pain and normal sensation.  You may experience feelings such as numbness, tingling, heaviness, weakness or the inability to move your arm or the feeling or sensation that your arm has "fallen asleep". 3. A nerve block with Exparel can last up to 5 days.  Usually the weakness wears off first.  The tingling and heaviness usually wear off next.  Finally you may start to notice pain.  Keep in mind that this may occur in any order.  Once a nerve block starts to wear off it is usually completely gone within 60 minutes. 4. ISNB may cause mild shortness of breath, a hoarse voice, blurry vision, unequal pupils, or drooping of the face on the same side as the nerve block.  These symptoms will usually resolve with the numbness.  Very rarely the procedure itself can cause mild seizures. 5. If needed, your surgeon will give you a prescription for pain medication.  It will take about 60 minutes for the oral pain medication to become fully effective.  So, it is recommended that you start taking this medication before the nerve block first begins to wear off, or when you first begin to feel discomfort. 6. Take your pain medication only as prescribed.  Pain medication can cause sedation  and decrease your breathing if you take more than you need for the level of pain that you have. 7. Nausea is a common side effect of many pain medications.  You may want to eat something before taking your pain medicine to prevent nausea. 8. After an Interscalene nerve block, you cannot feel pain, pressure or extremes in temperature in the effected arm.  Because your arm is numb it is at an increased risk for injury.  To decrease the possibility of injury, please practice the following:  a. While you are awake change the position of your arm frequently to prevent too much pressure on any one area for prolonged periods of time. b.  If you have  a cast or tight dressing, check the color or your fingers every couple of hours.  Call your surgeon with the appearance of any discoloration (white or blue). c. If you are given a sling to wear before you go home, please wear it  at all times until the block has completely worn off.  Do not get up at night without your sling. d. Please contact [facility] or your surgeon if you do not begin to regain sensation after 7 days from the surgery.  Anesthesia may be contacted by calling the Same Day Surgery Department, Mon. through Fri., 6 am to 4 pm at [phone].   e. If you experience any other problems or concerns, please contact your surgeon's office. f. If you experience severe or prolonged shortness of breath go to the nearest emergency department.

## 2019-03-10 NOTE — Anesthesia Procedure Notes (Signed)
Procedure Name: Intubation Date/Time: 03/10/2019 7:58 AM Performed by: Lorie Apley, CRNA Pre-anesthesia Checklist: Patient identified, Patient being monitored, Timeout performed, Emergency Drugs available and Suction available Patient Re-evaluated:Patient Re-evaluated prior to induction Oxygen Delivery Method: Circle system utilized Preoxygenation: Pre-oxygenation with 100% oxygen Induction Type: IV induction Ventilation: Mask ventilation without difficulty Laryngoscope Size: Mac and 3 Grade View: Grade I Tube type: Oral Tube size: 7.0 mm Number of attempts: 1 Airway Equipment and Method: Stylet Placement Confirmation: ETT inserted through vocal cords under direct vision,  positive ETCO2 and breath sounds checked- equal and bilateral Secured at: 21 cm Tube secured with: Tape Dental Injury: Teeth and Oropharynx as per pre-operative assessment

## 2019-03-14 ENCOUNTER — Other Ambulatory Visit: Payer: Self-pay

## 2019-03-14 ENCOUNTER — Emergency Department
Admission: EM | Admit: 2019-03-14 | Discharge: 2019-03-15 | Disposition: A | Discharge status: Home / Self Care | Payer: PPO | Attending: Emergency Medicine | Admitting: Emergency Medicine

## 2019-03-14 ENCOUNTER — Emergency Department: Payer: PPO

## 2019-03-14 DIAGNOSIS — M62838 Other muscle spasm: Secondary | ICD-10-CM | POA: Diagnosis not present

## 2019-03-14 DIAGNOSIS — M542 Cervicalgia: Secondary | ICD-10-CM | POA: Insufficient documentation

## 2019-03-14 DIAGNOSIS — Z79899 Other long term (current) drug therapy: Secondary | ICD-10-CM | POA: Insufficient documentation

## 2019-03-14 DIAGNOSIS — R079 Chest pain, unspecified: Secondary | ICD-10-CM | POA: Diagnosis present

## 2019-03-14 DIAGNOSIS — I10 Essential (primary) hypertension: Secondary | ICD-10-CM | POA: Diagnosis not present

## 2019-03-14 DIAGNOSIS — G8918 Other acute postprocedural pain: Secondary | ICD-10-CM | POA: Insufficient documentation

## 2019-03-14 DIAGNOSIS — Z86718 Personal history of other venous thrombosis and embolism: Secondary | ICD-10-CM | POA: Insufficient documentation

## 2019-03-14 DIAGNOSIS — J40 Bronchitis, not specified as acute or chronic: Secondary | ICD-10-CM | POA: Diagnosis not present

## 2019-03-14 DIAGNOSIS — R0602 Shortness of breath: Secondary | ICD-10-CM | POA: Diagnosis not present

## 2019-03-14 DIAGNOSIS — J189 Pneumonia, unspecified organism: Secondary | ICD-10-CM | POA: Diagnosis not present

## 2019-03-14 LAB — CBC WITH DIFFERENTIAL/PLATELET
Abs Immature Granulocytes: 0.04 10*3/uL (ref 0.00–0.07)
Basophils Absolute: 0.1 10*3/uL (ref 0.0–0.1)
Basophils Relative: 1 %
Eosinophils Absolute: 0.1 10*3/uL (ref 0.0–0.5)
Eosinophils Relative: 1 %
HCT: 37.5 % (ref 36.0–46.0)
Hemoglobin: 12.5 g/dL (ref 12.0–15.0)
Immature Granulocytes: 0 %
Lymphocytes Relative: 13 %
Lymphs Abs: 1.5 10*3/uL (ref 0.7–4.0)
MCH: 31 pg (ref 26.0–34.0)
MCHC: 33.3 g/dL (ref 30.0–36.0)
MCV: 93.1 fL (ref 80.0–100.0)
Monocytes Absolute: 0.7 10*3/uL (ref 0.1–1.0)
Monocytes Relative: 6 %
Neutro Abs: 9.2 10*3/uL — ABNORMAL HIGH (ref 1.7–7.7)
Neutrophils Relative %: 79 %
Platelets: 251 10*3/uL (ref 150–400)
RBC: 4.03 MIL/uL (ref 3.87–5.11)
RDW: 12.3 % (ref 11.5–15.5)
WBC: 11.6 10*3/uL — ABNORMAL HIGH (ref 4.0–10.5)
nRBC: 0 % (ref 0.0–0.2)

## 2019-03-14 MED ORDER — AZITHROMYCIN 250 MG PO TABS: ORAL_TABLET | ORAL | 0 refills | Status: AC

## 2019-03-14 MED ORDER — DIAZEPAM 5 MG PO TABS
5.0000 mg | ORAL_TABLET | Freq: Once | ORAL | Status: AC
  Administered 2019-03-14: 5 mg via ORAL
  Filled 2019-03-14: qty 1

## 2019-03-14 NOTE — ED Triage Notes (Signed)
Pt to the er for post op complications,. Pt had shoulder surgery on Wednesday with a nerve block and now pt has SOB, headache and lack of sensation to the upper chest and neck. Pt states she has not done well with the nerve block. Pt also reports nausea.

## 2019-03-14 NOTE — ED Notes (Signed)
Patient had shoulder surgery a few days ago. Presents because her nerve block wore off and she is having pain in her neck, shoulder pain, headache and it causes pain to turn her head. Patient is alert and oriented at this time. Husband is at bedside.

## 2019-03-14 NOTE — ED Provider Notes (Addendum)
[facility] Emergency Department Provider Note  ____________________________________________   First MD Initiated Contact with Patient 03/14/19 2130     (approximate)  I have reviewed the triage vital signs and the nursing notes.  History  Chief Complaint Post-op Problem    HPI NORIS HAGHIGHI is a 73 y.o. female with history of HTN, recent rotator cuff repair on 9/30 with nerve block, who presents to the ER for abnormal upper chest sensation. Also complains separately of left lateral neck/trapezius stiffness and pain.   Patient states she received a nerve block for her surgery.  She states today she felt like the nerve block began to wear off, and in terms of her pain she has been managing well.  However, over the course of the day she developed an odd sensation across her upper chest area.  Difficult to describe, perhaps tightness-like sensation or pins and needle sensation.  She also complains of some stiffness to the left side of her neck.  She states it feels similar to when you wake up in the morning with a "crick in your neck" after sleeping in an odd position.  She denies any left upper extremity weakness, numbness, or tingling.  Per triage note, she reported some shortness of breath, but on my evaluation she currently denies any difficulty breathing.  She denies any nausea or vomiting.  She denies any diaphoresis.  She denies any personal cardiac history.     Past Medical Hx Past Medical History:  Diagnosis Date  . Arthritis   . 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 GERD (gastroesophageal reflux disease) 8/01    Dr. Tiffany Kocher  . Hypertension   . Low back pain 11/95  . Menopause   . Restless leg syndrome 1999    Problem List Patient Active Problem List   Diagnosis Date Noted  . S/P total knee arthroplasty 07/07/2017  . Status post total left knee replacement 10/28/2016  . 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 S/P closed reduction of dislocated total hip prosthesis 08/08/2015  . S/P total hip arthroplasty 06/14/2015  . Benign essential HTN 12/28/2013  . Acid reflux 12/28/2013  . HLD (hyperlipidemia) 12/28/2013  . Restless leg 12/28/2013    Past Surgical Hx Past Surgical History:  Procedure Laterality Date  . [place]   X2  . COLONOSCOPY  12/06/11   normal  . COLONOSCOPY WITH PROPOFOL N/A 04/13/2018   Procedure: COLONOSCOPY WITH PROPOFOL;  Surgeon: Manya Silvas, MD;  Location: [facility] ENDOSCOPY;  Service: Endoscopy;  Laterality: N/A;  . ESOPHAGOGASTRODUODENOSCOPY (EGD) WITH PROPOFOL N/A 02/23/2015   Procedure: ESOPHAGOGASTRODUODENOSCOPY (EGD) WITH PROPOFOL;  Surgeon: Manya Silvas, MD;  Location: [facility] ENDOSCOPY;  Service: Endoscopy;  Laterality: N/A;  . ESOPHAGOGASTRODUODENOSCOPY (EGD) WITH PROPOFOL N/A 04/13/2018   Procedure: ESOPHAGOGASTRODUODENOSCOPY (EGD) WITH PROPOFOL;  Surgeon: Manya Silvas, MD;  Location: [facility] ENDOSCOPY;  Service: Endoscopy;  Laterality: N/A;  . HIP CLOSED REDUCTION Left 08/08/2015   Procedure: CLOSED REDUCTION HIP;  Surgeon: Dereck Leep, MD;  Location: [facility] ORS;  Service: Orthopedics;  Laterality: Left;  . HIP CLOSED REDUCTION Left 09/18/2015   Procedure: CLOSED REDUCTION HIP;  Surgeon: Hessie Knows, MD;  Location: [facility] ORS;  Service: Orthopedics;  Laterality: Left;  . HIP CLOSED REDUCTION Left 12/01/2015   Procedure: CLOSED REDUCTION HIP;  Surgeon: Dereck Leep, MD;  Location: [facility] ORS;  Service: Orthopedics;  Laterality:  Left;  no prep no incision  . JOINT REPLACEMENT  10/2016   left knee  . KNEE ARTHROPLASTY Left 10/28/2016   Procedure: COMPUTER ASSISTED TOTAL KNEE ARTHROPLASTY;  Surgeon: Dereck Leep, MD;  Location: [facility] ORS;  Service: Orthopedics;  Laterality: Left;  . KNEE ARTHROPLASTY Right 07/07/2017   Procedure: COMPUTER ASSISTED TOTAL KNEE ARTHROPLASTY;  Surgeon: Dereck Leep, MD;  Location:  [facility] ORS;  Service: Orthopedics;  Laterality: Right;  . KNEE ARTHROSCOPY Right 11/14/2014   Procedure: ARTHROSCOPY KNEE;  Surgeon: Dereck Leep, MD;  Location: [facility] ORS;  Service: Orthopedics;  Laterality: Right;  Posterior horn menicus, anterior lateral menicus grade 3 chondromalacia  . phlebitis groin    . SHOULDER ARTHROSCOPY WITH OPEN ROTATOR CUFF REPAIR Left 03/10/2019   Procedure: LEFT SHOULDER ARTHROSCOPY, ARTHROSCOPIC ROTATOR CUFF REPAIR, DISTAL CLAVICLE EXCISION, SUBACROMIAL DECOMPRESSION, INTRA-ARTICULAR DEBRIDEMENT ;  Surgeon: Lovell Sheehan, MD;  Location: [facility] ORS;  Service: Orthopedics;  Laterality: Left;  . TONSILLECTOMY    . TOTAL HIP ARTHROPLASTY Left 05/08/11   necrosis of hip  . TOTAL HIP ARTHROPLASTY Right 06/14/2015   Procedure: TOTAL HIP ARTHROPLASTY;  Surgeon: Dereck Leep, MD;  Location: [facility] ORS;  Service: Orthopedics;  Laterality: Right;  . TOTAL HIP REVISION Left 02/21/2016   Procedure: TOTAL HIP REVISION;  Surgeon: Dereck Leep, MD;  Location: [facility] ORS;  Service: Orthopedics;  Laterality: Left;    Medications Prior to Admission medications   Medication Sig Start Date End Date Taking? Authorizing Provider  atenolol (TENORMIN) 50 MG tablet Take 50 mg by mouth daily.     [provider]  Biotin 5000 MCG TABS Take 10,000 mcg by mouth daily.     [provider]  Calcium-Phosphorus-Vitamin D (CITRACAL +D3 PO) Take 1 tablet by mouth 2 (two) times daily.     [provider]  Coenzyme Q10 (CO Q-10) 200 MG CAPS Take 200 mg by mouth daily.    [provider]  docusate sodium (COLACE) 100 MG capsule Take 1 capsule (100 mg total) by mouth daily as needed. 03/10/19 03/09/20  Lovell Sheehan, MD  HYDROcodone-acetaminophen (NORCO/VICODIN) 5-325 MG tablet Take 1 tablet by mouth every 4 (four) hours as needed for moderate pain. 03/10/19 03/09/20  Lovell Sheehan, MD  Melatonin 10 MG TABS Take 10 mg by mouth at bedtime.    [provider]   Menthol, Topical Analgesic, (BIOFREEZE EX) Apply 1 application topically daily as needed (foot pain).    [provider]  omeprazole (PRILOSEC) 20 MG capsule Take 20 mg by mouth daily.    [provider]  pramipexole (MIRAPEX) 1.5 MG tablet Take 1.5 mg by mouth at bedtime.     [provider]  Red Yeast Rice 600 MG TABS Take 600 mg by mouth 2 (two) times daily.     [provider]    Allergies Penicillins, Erythromycin, and Tetracyclines & related  Family Hx Family History  Problem Relation Age of Onset  . Diabetes Mother   . Diabetes Maternal Grandfather   . Hypertension Maternal Grandfather   . Ovarian cancer Paternal Aunt   . Cancer - Lung Paternal Uncle     Social Hx Social History   Tobacco Use  . Smoking status: Never Smoker  . Smokeless tobacco: Never Used  Substance Use Topics  . Alcohol use: No  . Drug use: No     Review of Systems  Constitutional: Negative for fever, chills. Eyes: Negative for visual changes. ENT:  Negative for sore throat. Cardiovascular: + chest discomfort Respiratory: Negative for shortness of breath. Gastrointestinal: Negative for nausea, vomiting.  Genitourinary: Negative for dysuria. Musculoskeletal: + pain in left lateral neck/trapezius Skin: Negative for rash. Neurological: Negative for for headaches.   Physical Exam  Vital Signs: ED Triage Vitals  Enc Vitals Group     BP 03/14/19 2112 (!) 145/69     Pulse Rate 03/14/19 2112 (!) 103     Resp 03/14/19 2112 20     Temp 03/14/19 2112 98.4 F (36.9 C)     Temp Source 03/14/19 2112 Oral     SpO2 03/14/19 2112 97 %     Weight 03/14/19 2113 190 lb (86.2 kg)     Height 03/14/19 2113 5\' 4"  ([id number] m)     Head Circumference --      Peak Flow --      Pain Score 03/14/19 2113 8     Pain Loc --      Pain Edu? --      Excl. in [place]? --     Constitutional: Alert and oriented.  Head: Normocephalic. Atraumatic. Eyes: Conjunctivae clear. Sclera  anicteric. Nose: No congestion. No rhinorrhea. Mouth/Throat: Mucous membranes are moist.  Neck: No stridor.  No midline spinal tenderness. Mild tenderness on palpation of lateral paraspinal muscles and trapezius.  Cardiovascular: Normal rate, regular rhythm. No murmurs. Extremities well perfused. Respiratory: Normal respiratory effort.  Lungs CTAB. Gastrointestinal: Soft. Non-tender. Non-distended.  Musculoskeletal: LUE in shoulder immobilizer. Distally, motor/sensation in tact in radial/ulnar/median distributions.  Neurologic:  Normal speech and language. No gross focal neurologic deficits are appreciated.  Skin: Skin is warm, dry and intact. Expected post operative ecchymosis. Incision sites C/D/I. No erythema, warmth, or effusions of skin or joints.  Psychiatric: Mood and affect are appropriate for situation.  EKG Rate: 99 Rhythm: sinus Axis: borderline leftward Intervals: within normal limits TWI in III No acute ischemic changes No STEMI   Radiology  XR: IMPRESSION: 1. Diffuse peribronchial cuffing and scattered areas of interstitial prominence in the mid to lower lungs bilaterally, concerning for bronchitis. 2. Elevation of left hemidiaphragm.     Procedures  Procedure(s) performed (including critical care):  Procedures   Initial Impression / Assessment and Plan / ED Course  73 y.o. female with history of HTN, recent rotator cuff repair on 9/30 with nerve block, who presents to the ER for abnormal upper chest sensation. Also complains separately of left lateral neck/trapezius stiffness and pain.   Ddx: atypical ACS, post nerve block, post procedural pain. No evidence of post operative skin or joint infection. No evidence of hematoma. Will obtain basic labs, imaging, EKG.  With regards to her neck complaints, suspect likely MSK etiology. No associated neurological symptoms suggestive of nerve etiology or complication. No fevers or other infections symptoms  suggestive of meningitis. Will trial dose of Valium and reassess.  XR suspicious for bronchitis. Story is somewhat atypical, but will elect to cover with short course azithromycin, which patient is agreeable to. Awaiting labs, EKG.    Final Clinical Impression(s) / ED Diagnosis  Final diagnoses:  Bronchitis       Note:  This document was prepared using Dragon voice recognition software and may include unintentional dictation errors.     Lilia Pro., MD 03/15/19 0150    Lilia Pro., MD 03/22/19 2041

## 2019-03-14 NOTE — ED Notes (Signed)
Patient tolerated po medication well.

## 2019-03-14 NOTE — ED Notes (Signed)
Patient assisted to the bathroom. Steady gait to bathroom without one person safety assist. Patient awaiting CT results at this time. Husband at bedside.

## 2019-03-15 ENCOUNTER — Emergency Department: Payer: PPO

## 2019-03-15 ENCOUNTER — Encounter: Payer: Self-pay | Admitting: Radiology

## 2019-03-15 DIAGNOSIS — R0602 Shortness of breath: Secondary | ICD-10-CM | POA: Diagnosis not present

## 2019-03-15 LAB — COMPREHENSIVE METABOLIC PANEL
ALT: 16 U/L (ref 0–44)
AST: 18 U/L (ref 15–41)
Albumin: 4 g/dL (ref 3.5–5.0)
Alkaline Phosphatase: 67 U/L (ref 38–126)
Anion gap: 10 (ref 5–15)
BUN: 13 mg/dL (ref 8–23)
CO2: 26 mmol/L (ref 22–32)
Calcium: 9.2 mg/dL (ref 8.9–10.3)
Chloride: 99 mmol/L (ref 98–111)
Creatinine, Ser: 0.55 mg/dL (ref 0.44–1.00)
GFR calc Af Amer: >60 mL/min (ref >60)
GFR calc non Af Amer: >60 mL/min (ref >60)
Glucose, Bld: 130 mg/dL — ABNORMAL HIGH (ref 70–99)
Potassium: 3.6 mmol/L (ref 3.5–5.1)
Sodium: 135 mmol/L (ref 135–145)
Total Bilirubin: 1 mg/dL (ref 0.3–1.2)
Total Protein: 7.4 g/dL (ref 6.5–8.1)

## 2019-03-15 LAB — TROPONIN I (HIGH SENSITIVITY)
Troponin I (High Sensitivity): 4 ng/L (ref <18)
Troponin I (High Sensitivity): 4 ng/L (ref <18)

## 2019-03-15 MED ORDER — CEFDINIR 300 MG PO CAPS
300.0000 mg | ORAL_CAPSULE | Freq: Two times a day (BID) | ORAL | Status: DC
  Administered 2019-03-15: 300 mg via ORAL
  Filled 2019-03-15 (×2): qty 1

## 2019-03-15 MED ORDER — HYDROCODONE-ACETAMINOPHEN 5-325 MG PO TABS
1.0000 | ORAL_TABLET | Freq: Once | ORAL | Status: AC
  Administered 2019-03-15: 1 via ORAL
  Filled 2019-03-15: qty 1

## 2019-03-15 MED ORDER — AZITHROMYCIN 250 MG PO TABS: ORAL_TABLET | ORAL | 0 refills | Status: DC

## 2019-03-15 MED ORDER — DIAZEPAM 5 MG PO TABS: 5.0000 mg | ORAL_TABLET | Freq: Every evening | ORAL | 0 refills | Status: DC | PRN

## 2019-03-15 MED ORDER — AZITHROMYCIN 500 MG PO TABS
500.0000 mg | ORAL_TABLET | Freq: Once | ORAL | Status: AC
  Administered 2019-03-15: 500 mg via ORAL
  Filled 2019-03-15: qty 1

## 2019-03-15 MED ORDER — IOHEXOL 350 MG/ML SOLN
75.0000 mL | Freq: Once | INTRAVENOUS | Status: AC | PRN
  Administered 2019-03-15: 75 mL via INTRAVENOUS

## 2019-03-15 MED ORDER — CEFDINIR 300 MG PO CAPS: 300.0000 mg | ORAL_CAPSULE | Freq: Two times a day (BID) | ORAL | 0 refills | Status: AC

## 2019-03-15 NOTE — ED Notes (Signed)
Pt has returned from CT; sitting in wheelchair where she says she's most comfortable; admits to staying in recliner at home most of the day as that is where she is most comfortable;

## 2019-03-15 NOTE — ED Provider Notes (Signed)
----------------------------------------- 12:38 AM on 03/15/2019 -----------------------------------------   Blood pressure (!) 145/69, pulse (!) 103, temperature 98.4 F (36.9 C), temperature source Oral, resp. rate 20, height 5\' 4"  (1.626 m), weight 86.2 kg, last menstrual period 02/08/2005, SpO2 97 %.  Assuming care from Dr. Joan Mayans of [place] [facility] is a 73 y.o. female with a chief complaint of Post-op Problem .    Please refer to H&P by previous MD for further details.  The current plan of care is to f/u labs and reassess.  Patient is postop day 5 from a rotator cuff repair, describing a sensation of pins-and-needles over her neck, upper back, and left shoulder since her nerve block wore off.  No fevers.  Patient does report a history of a prior DVT with surgery in the past.  She is not on anticoagulation.  She is complaining of mild intermittent shortness of breath associated with this pain.  Called her surgeon today who told her to come to the emergency room.  Patient is well-appearing and in no distress, with normal vital signs, she has decent range of motion of the shoulder with no swelling, no redness, no fever, no significant leukocytosis.  Clinically no signs of infection.  Chest x-ray showing no evidence of pneumonia.  EKG and troponin are negative for ischemia.  With a history of prior DVT and mild shortness of breath we will send patient for CT Angie of the chest to rule out a PE.  She was given Valium for muscle spasms.  Her pain is reproducible on palpation of the trapezius which makes his concern for musculoskeletal pain vs post-op pain vs nerve paresthesia.  Will re-dose her Vicodin.  _________________________ 3:59 AM on 03/15/2019 -----------------------------------------  CT negative for PE.  Concerning for possible early pneumonia.  Patient does endorse a mild dry cough.  Will cover broadly with third-generation cephalosporin and azithromycin.  Discussed with patient since he  has several allergic reactions to antibiotics.  She has been on azithromycin in the past with no difficulties.  At this time there is no oxygen requirement, normal work of breathing, no signs of sepsis.  I explained to her that she might need admission for IV antibiotics however she would like to try p.o. antibiotics first.  She will follow-up with her surgeon tomorrow and her primary care doctor.  Recommended return to the emergency room for fever, worsening chest pain or shortness of breath.  Patient will also be prescribed a short course of Valium for muscle spasms.   I have personally reviewed the images performed during this visit and I agree with the Radiologist's read.   Interpretation by Radiologist:  Dg Chest 2 View  Result Date: 03/14/2019 CLINICAL DATA:  73 year old female with history of recent shoulder surgery now with shortness of breath. EXAM: CHEST - 2 VIEW COMPARISON:  No priors. FINDINGS: Elevation of the left hemidiaphragm. Lung volumes are low. Linear opacities in the left lung base likely reflect subsegmental atelectasis. Patchy areas of interstitial prominence noted throughout the mid to lower lungs bilaterally where there is also peribronchial cuffing. No definite pleural effusions. No cephalization of the pulmonary vasculature. Heart size appears borderline enlarged. Upper mediastinal contours are within normal limits. IMPRESSION: 1. Diffuse peribronchial cuffing and scattered areas of interstitial prominence in the mid to lower lungs bilaterally, concerning for bronchitis. 2. Elevation of left hemidiaphragm. Electronically Signed   By: Vinnie Langton M.D.   On: 03/14/2019 22:32   Ct Angio Chest Pe W And/or Wo Contrast  Result Date: 03/15/2019 CLINICAL DATA:  Shortness of breath EXAM: CT ANGIOGRAPHY CHEST WITH CONTRAST TECHNIQUE: Multidetector CT imaging of the chest was performed using the standard protocol during bolus administration of intravenous contrast. Multiplanar CT  image reconstructions and MIPs were obtained to evaluate the vascular anatomy. CONTRAST:  51mL OMNIPAQUE IOHEXOL 350 MG/ML SOLN COMPARISON:  Radiograph same day FINDINGS: Cardiovascular: There is a optimal opacification of the pulmonary arteries. There is no central,segmental, or subsegmental filling defects within the pulmonary arteries. There is mild cardiomegaly. Coronary artery calcifications are seen. There is normal three-vessel brachiocephalic anatomy without proximal stenosis. Scattered aortic atherosclerotic calcifications are seen without aneurysmal dilatation. Mediastinum/Nodes: Scattered small right hilar lymph nodes are seen. Thyroid gland, trachea, and esophagus demonstrate no significant findings. Lungs/Pleura: There is patchy areas of consolidation seen at the left lung base and a small I am going to seen posteriorly at the right lung base. No pleural effusion. No pulmonary nodules. Upper Abdomen: No acute abnormalities present in the visualized portions of the upper abdomen. Musculoskeletal: No chest wall abnormality. No acute or significant osseous findings. Review of the MIP images confirms the above findings. IMPRESSION: No central, segmental, or subsegmental pulmonary embolism. Patchy/streaky airspace consolidation at the bilateral lung bases, left greater than right. This could represent atypical infection and/or atelectasis. Electronically Signed   By: Prudencio Pair M.D.   On: 03/15/2019 02:48           Rudene Re, MD 03/15/19 0400

## 2019-03-15 NOTE — ED Notes (Signed)
Pt moved from hallway bed to treatment room 19; husband at side;

## 2019-03-15 NOTE — Discharge Instructions (Addendum)

## 2019-03-15 NOTE — ED Notes (Signed)
Pt resting quietly in hallway bed; understands as soon as room 19 has been cleaned thoroughly by EVS she will be moved into that room; pt appreciative

## 2019-03-15 NOTE — ED Notes (Signed)
House supervisor in to speak with pt at her request;

## 2019-03-15 NOTE — ED Notes (Signed)
Radiology informed pt has an IV placed and is ready for radiology

## 2019-03-15 NOTE — ED Notes (Signed)
Pt's husband out to desk; states radiology told patient it would be about 30 minutes until her CT is read by a radiologist; says it's been about 45 minutes and pt is anxious; explained the CT results are back, MD just needs time to review and follow up with her; verbalized understanding;

## 2019-03-15 NOTE — ED Notes (Signed)
In to check on pt, she is in radiology; husband waiting patiently; no complaints from him or requests; declined offer of water;

## 2019-03-23 DIAGNOSIS — M25512 Pain in left shoulder: Secondary | ICD-10-CM | POA: Diagnosis not present

## 2019-03-25 DIAGNOSIS — M25512 Pain in left shoulder: Secondary | ICD-10-CM | POA: Diagnosis not present

## 2019-03-25 DIAGNOSIS — Z9889 Other specified postprocedural states: Secondary | ICD-10-CM | POA: Diagnosis not present

## 2019-03-30 DIAGNOSIS — Z9889 Other specified postprocedural states: Secondary | ICD-10-CM | POA: Diagnosis not present

## 2019-03-30 DIAGNOSIS — M25512 Pain in left shoulder: Secondary | ICD-10-CM | POA: Diagnosis not present

## 2019-04-01 DIAGNOSIS — Z9889 Other specified postprocedural states: Secondary | ICD-10-CM | POA: Diagnosis not present

## 2019-04-01 DIAGNOSIS — M25512 Pain in left shoulder: Secondary | ICD-10-CM | POA: Diagnosis not present

## 2019-04-05 DIAGNOSIS — Z0000 Encounter for general adult medical examination without abnormal findings: Secondary | ICD-10-CM | POA: Diagnosis not present

## 2019-04-05 DIAGNOSIS — Z1331 Encounter for screening for depression: Secondary | ICD-10-CM | POA: Diagnosis not present

## 2019-04-06 DIAGNOSIS — M25512 Pain in left shoulder: Secondary | ICD-10-CM | POA: Diagnosis not present

## 2019-04-09 DIAGNOSIS — M25512 Pain in left shoulder: Secondary | ICD-10-CM | POA: Diagnosis not present

## 2019-04-13 DIAGNOSIS — M25512 Pain in left shoulder: Secondary | ICD-10-CM | POA: Diagnosis not present

## 2019-04-15 DIAGNOSIS — Z9889 Other specified postprocedural states: Secondary | ICD-10-CM | POA: Diagnosis not present

## 2019-04-15 DIAGNOSIS — M25512 Pain in left shoulder: Secondary | ICD-10-CM | POA: Diagnosis not present

## 2019-04-19 ENCOUNTER — Ambulatory Visit (INDEPENDENT_AMBULATORY_CARE_PROVIDER_SITE_OTHER): Payer: PPO | Admitting: Podiatry

## 2019-04-19 ENCOUNTER — Ambulatory Visit (INDEPENDENT_AMBULATORY_CARE_PROVIDER_SITE_OTHER): Payer: PPO

## 2019-04-19 ENCOUNTER — Other Ambulatory Visit: Payer: Self-pay

## 2019-04-19 ENCOUNTER — Encounter: Payer: Self-pay | Admitting: Podiatry

## 2019-04-19 DIAGNOSIS — M1990 Unspecified osteoarthritis, unspecified site: Secondary | ICD-10-CM

## 2019-04-19 DIAGNOSIS — G8929 Other chronic pain: Secondary | ICD-10-CM

## 2019-04-19 DIAGNOSIS — M79673 Pain in unspecified foot: Secondary | ICD-10-CM | POA: Diagnosis not present

## 2019-04-19 DIAGNOSIS — M722 Plantar fascial fibromatosis: Secondary | ICD-10-CM

## 2019-04-19 MED ORDER — CELECOXIB 100 MG PO CAPS: 200.0000 mg | ORAL_CAPSULE | Freq: Every day | ORAL | 1 refills | Status: DC

## 2019-04-19 NOTE — Patient Instructions (Signed)
Celecoxib capsules What is this medicine? CELECOXIB (sell a KOX ib) is a non-steroidal anti-inflammatory drug (NSAID). This medicine is used to treat arthritis and ankylosing spondylitis. It may be also used for pain or painful monthly periods. This medicine may be used for other purposes; ask your health care provider or pharmacist if you have questions. COMMON BRAND NAME(S): Celebrex What should I tell my health care provider before I take this medicine? They need to know if you have any of these conditions:  asthma  coronary artery bypass graft (CABG) surgery within the past 2 weeks  drink more than 3 alcohol-containing drinks a day  heart disease or circulation problems like heart failure or leg edema (fluid retention)  high blood pressure  kidney disease  liver disease  stomach bleeding or ulcers  an unusual or allergic reaction to celecoxib, sulfa drugs, aspirin, other NSAIDs, other medicines, foods, dyes, or preservatives  pregnant or trying to get pregnant  breast-feeding How should I use this medicine? Take this medicine by mouth with a full glass of water. Follow the directions on the prescription label. Take it with food if it upsets your stomach or if you take 400 mg at one time. Try to not lie down for at least 10 minutes after you take the medicine. Take the medicine at the same time each day. Do not take more medicine than you are told to take. Long-term, continuous use may increase the risk of heart attack or stroke. A special MedGuide will be given to you by the pharmacist with each prescription and refill. Be sure to read this information carefully each time. Talk to your pediatrician regarding the use of this medicine in children. Special care may be needed. Overdosage: If you think you have taken too much of this medicine contact a poison control center or emergency room at once. NOTE: This medicine is only for you. Do not share this medicine with others. What  if I miss a dose? If you miss a dose, take it as soon as you can. If it is almost time for your next dose, take only that dose. Do not take double or extra doses. What may interact with this medicine? Do not take this medicine with any of the following medications:  cidofovir  methotrexate  other NSAIDs, medicines for pain and inflammation, like ibuprofen or naproxen  pemetrexed This medicine may also interact with the following medications:  alcohol  aspirin and aspirin-like drugs  diuretics  fluconazole  lithium  medicines for high blood pressure  steroid medicines like prednisone or cortisone  warfarin This list may not describe all possible interactions. Give your health care provider a list of all the medicines, herbs, non-prescription drugs, or dietary supplements you use. Also tell them if you smoke, drink alcohol, or use illegal drugs. Some items may interact with your medicine. What should I watch for while using this medicine? Tell your doctor or healthcare provider if your pain does not get better. Talk to your doctor before taking another medicine for pain. Do not treat yourself. This medicine may cause serious skin reactions. They can happen weeks to months after starting the medicine. Contact your healthcare provider right away if you notice fevers or flu-like symptoms with a rash. The rash may be red or purple and then turn into blisters or peeling of the skin. Or, you might notice a red rash with swelling of the face, lips or lymph nodes in your neck or under your arms. This medicine  does not prevent heart attack or stroke. In fact, this medicine may increase the chance of a heart attack or stroke. The chance may increase with longer use of this medicine and in people who have heart disease. If you take aspirin to prevent heart attack or stroke, talk with your doctor or healthcare provider. Do not take medicines such as ibuprofen and naproxen with this medicine. Side  effects such as stomach upset, nausea, or ulcers may be more likely to occur. Many medicines available without a prescription should not be taken with this medicine. This medicine can cause ulcers and bleeding in the stomach and intestines at any time during treatment. Ulcers and bleeding can happen without warning symptoms and can cause death. What side effects may I notice from receiving this medicine? Side effects that you should report to your doctor or health care professional as soon as possible:  allergic reactions like skin rash, itching or hives, swelling of the face, lips, or tongue  black or bloody stools, blood in the urine or vomit  blurred vision  breathing problems  chest pain  nausea, vomiting  problems with balance, talking, walking  rash, fever, and swollen lymph nodes  redness, blistering, peeling or loosening of the skin, including inside the mouth  unexplained weight gain or swelling  unusually weak or tired  yellowing of eyes, skin Side effects that usually do not require medical attention (report to your doctor or health care professional if they continue or are bothersome):  constipation or diarrhea  dizziness  gas or heartburn  upset stomach This list may not describe all possible side effects. Call your doctor for medical advice about side effects. You may report side effects to FDA at 1-800-FDA-1088. Where should I keep my medicine? Keep out of the reach of children. Store at room temperature between 15 and 30 degrees C (59 and 86 degrees F). Keep container tightly closed. Throw away any unused medicine after the expiration date. NOTE: This sheet is a summary. It may not cover all possible information. If you have questions about this medicine, talk to your doctor, pharmacist, or health care provider.  2020 [employer]/Gold Standard (2018-08-12 09:10:08)

## 2019-04-20 DIAGNOSIS — M25512 Pain in left shoulder: Secondary | ICD-10-CM | POA: Diagnosis not present

## 2019-04-20 NOTE — Progress Notes (Signed)
Subjective:   Patient ID: Claudia Terry, female   DOB: 73 y.o.   MRN: [id number]   HPI 73 year old female presents the office today for second opinion regards to chronic bilateral foot pain.  She states that she has had pain to both of her feet for some time she has been seen by Dr. Elvina Mattes.  She was told that she has significant arthritis to her feet.  She has been wearing orthotics which were helpful however the pain seems to be progressing and she still having discomfort to her feet.  She previously was on Celebrex which she is no longer taking.  This was helpful.  She states that she is also had injections for her heels and she has not had 1 in several months and she requested injection in those areas today.  She is also had injections the tops of her feet as well.  She denies any recent injury.  She does have a history of hip and knee replacements.  She recently just underwent left rotator cuff surgery.  She does have a family history of rheumatoid arthritis but she states that she has been checked and she does not have any other forms of arthritis.   Review of Systems  All other systems reviewed and are negative.  Past Medical History:  Diagnosis Date  . Arthritis   . 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 GERD (gastroesophageal reflux disease) 8/01    Dr. Tiffany Kocher  . Hypertension   . Low back pain 11/95  . Menopause   . Restless leg syndrome 1999    Past Surgical History:  Procedure Laterality Date  . [place]   X2  . COLONOSCOPY  12/06/11   normal  . COLONOSCOPY WITH PROPOFOL N/A 04/13/2018   Procedure: COLONOSCOPY WITH PROPOFOL;  Surgeon: Manya Silvas, MD;  Location: [facility] ENDOSCOPY;  Service: Endoscopy;  Laterality: N/A;  . ESOPHAGOGASTRODUODENOSCOPY (EGD) WITH PROPOFOL N/A 02/23/2015   Procedure: ESOPHAGOGASTRODUODENOSCOPY (EGD) WITH PROPOFOL;  Surgeon: Manya Silvas, MD;  Location: [facility] ENDOSCOPY;  Service:  Endoscopy;  Laterality: N/A;  . ESOPHAGOGASTRODUODENOSCOPY (EGD) WITH PROPOFOL N/A 04/13/2018   Procedure: ESOPHAGOGASTRODUODENOSCOPY (EGD) WITH PROPOFOL;  Surgeon: Manya Silvas, MD;  Location: [facility] ENDOSCOPY;  Service: Endoscopy;  Laterality: N/A;  . HIP CLOSED REDUCTION Left 08/08/2015   Procedure: CLOSED REDUCTION HIP;  Surgeon: Dereck Leep, MD;  Location: [facility] ORS;  Service: Orthopedics;  Laterality: Left;  . HIP CLOSED REDUCTION Left 09/18/2015   Procedure: CLOSED REDUCTION HIP;  Surgeon: Hessie Knows, MD;  Location: [facility] ORS;  Service: Orthopedics;  Laterality: Left;  . HIP CLOSED REDUCTION Left 12/01/2015   Procedure: CLOSED REDUCTION HIP;  Surgeon: Dereck Leep, MD;  Location: [facility] ORS;  Service: Orthopedics;  Laterality: Left;  no prep no incision  . JOINT REPLACEMENT  10/2016   left knee  . KNEE ARTHROPLASTY Left 10/28/2016   Procedure: COMPUTER ASSISTED TOTAL KNEE ARTHROPLASTY;  Surgeon: Dereck Leep, MD;  Location: [facility] ORS;  Service: Orthopedics;  Laterality: Left;  . KNEE ARTHROPLASTY Right 07/07/2017   Procedure: COMPUTER ASSISTED TOTAL KNEE ARTHROPLASTY;  Surgeon: Dereck Leep, MD;  Location: [facility] ORS;  Service: Orthopedics;  Laterality: Right;  . KNEE ARTHROSCOPY Right 11/14/2014   Procedure: ARTHROSCOPY KNEE;  Surgeon: Dereck Leep, MD;  Location: [facility] ORS;  Service: Orthopedics;  Laterality: Right;  Posterior horn menicus, anterior lateral menicus grade 3 chondromalacia  .  phlebitis groin    . SHOULDER ARTHROSCOPY WITH OPEN ROTATOR CUFF REPAIR Left 03/10/2019   Procedure: LEFT SHOULDER ARTHROSCOPY, ARTHROSCOPIC ROTATOR CUFF REPAIR, DISTAL CLAVICLE EXCISION, SUBACROMIAL DECOMPRESSION, INTRA-ARTICULAR DEBRIDEMENT ;  Surgeon: Lovell Sheehan, MD;  Location: [facility] ORS;  Service: Orthopedics;  Laterality: Left;  . TONSILLECTOMY    . TOTAL HIP ARTHROPLASTY Left 05/08/11   necrosis of hip  . TOTAL HIP ARTHROPLASTY Right 06/14/2015   Procedure: TOTAL HIP ARTHROPLASTY;   Surgeon: Dereck Leep, MD;  Location: [facility] ORS;  Service: Orthopedics;  Laterality: Right;  . TOTAL HIP REVISION Left 02/21/2016   Procedure: TOTAL HIP REVISION;  Surgeon: Dereck Leep, MD;  Location: [facility] ORS;  Service: Orthopedics;  Laterality: Left;     Current Outpatient Medications:  .  atenolol (TENORMIN) 50 MG tablet, TAKE 1 TABLET BY MOUTH TWICE A DAY, Disp: , Rfl:  .  Multiple Vitamin (MULTIVITAMIN) tablet, Take 1 tablet by mouth daily. Vitamin c, super b complex, nerve shield plus, Disp: , Rfl:  .  omeprazole (PRILOSEC) 20 MG capsule, Take by mouth., Disp: , Rfl:  .  Biotin 5000 MCG TABS, Take 10,000 mcg by mouth daily. , Disp: , Rfl:  .  Calcium-Phosphorus-Vitamin D (CITRACAL +D3 PO), Take 1 tablet by mouth 2 (two) times daily. , Disp: , Rfl:  .  celecoxib (CELEBREX) 100 MG capsule, Take 2 capsules (200 mg total) by mouth daily., Disp: 30 capsule, Rfl: 1 .  Coenzyme Q10 (CO Q-10) 200 MG CAPS, Take 200 mg by mouth daily., Disp: , Rfl:  .  diazepam (VALIUM) 5 MG tablet, Take 1 tablet (5 mg total) by mouth at bedtime as needed for muscle spasms., Disp: 7 tablet, Rfl: 0 .  docusate sodium (COLACE) 100 MG capsule, Take 1 capsule (100 mg total) by mouth daily as needed., Disp: 30 capsule, Rfl: 2 .  HYDROcodone-acetaminophen (NORCO/VICODIN) 5-325 MG tablet, Take 1 tablet by mouth every 4 (four) hours as needed for moderate pain., Disp: 20 tablet, Rfl: 0 .  Influenza Vac A&B Surf Ant Adj (FLUAD) 0.5 ML SUSY, Fluad 2019-20 32yr up(PF)45 mcg(15 mcgx3)/0.5 mL intramuscular syringe  ADM 0.5ML IM UTD, Disp: , Rfl:  .  Melatonin 10 MG TABS, Take 10 mg by mouth at bedtime., Disp: , Rfl:  .  Menthol, Topical Analgesic, (BIOFREEZE EX), Apply 1 application topically daily as needed (foot pain)., Disp: , Rfl:  .  pramipexole (MIRAPEX) 1.5 MG tablet, Take 1.5 mg by mouth at bedtime. , Disp: , Rfl:  .  Red Yeast Rice 600 MG TABS, Take 600 mg by mouth 2 (two) times daily. , Disp: , Rfl:   Allergies   Allergen Reactions  . Penicillins Hives and Other (See Comments)    Has patient had a PCN reaction causing immediate rash, facial/tongue/throat swelling, SOB or lightheadedness with hypotension: No Has patient had a PCN reaction causing severe rash involving mucus membranes or skin necrosis: No Has patient had a PCN reaction that required hospitalization No Has patient had a PCN reaction occurring within the last 10 years: No If all of the above answers are "NO", then may proceed with Cephalosporin use.  . Erythromycin Rash  . Tetracyclines & Related Rash         Objective:  Physical Exam  General: AAO x3, NAD  Dermatological: Skin is warm, dry and supple bilateral. Nails x 10 are well manicured; remaining integument appears unremarkable at this time. There are no open sores, no preulcerative lesions, no rash or  signs of infection present.  Vascular: Dorsalis Pedis artery and Posterior Tibial artery pedal pulses are 2/4 bilateral with immedate capillary fill time. There is no pain with calf compression, swelling, warmth, erythema.   Neruologic: Grossly intact via light touch bilateral. Protective threshold with Semmes Wienstein monofilament intact to all pedal sites bilateral.   Musculoskeletal: Evidence of flatfoot is present bilaterally.  There is bone spurs present dorsal aspects of bilateral feet.  There is tenderness on the plantar medial tubercle of the calcaneus at insertion of plantar fascial bilaterally.  There is a diffuse discomfort of bilateral feet there is no specific area of pinpoint tenderness.  Flexor, extensor tendons appear to be intact.  Muscular strength 5/5 in all groups tested bilateral.  Gait: Unassisted, Nonantalgic.       Assessment:   73 year old female with chronic bilateral foot pain, significant arthritis, plantar fasciitis    Plan:  -Treatment options discussed including all alternatives, risks, and complications -Etiology of symptoms were  discussed -I able to see the x-rays that she brought into the office today new x-rays were obtained and reviewed.  There is significant arthritic changes present the bilateral feet with the right side worse than left.  No evidence of acute fracture identified today. -At this time given the mild arthritis I do think she is on the current treatment plan as helpful.  Continue orthotics we discussed wearing stiffer soled shoe.  Injections were performed today plantar fascia bilaterally.  See procedure note below.  Refilled Celebrex.  She can use topical Voltaren as needed as well.  Procedure: Injection Tendon/Ligament Discussed alternatives, risks, complications and verbal consent was obtained.  Location: Bilateral plantar fascia at the glabrous junction; medial approach. Skin Prep: Alcohol  Injectate: 0.5cc 0.5% marcaine plain, 0.5 cc 2% lidocaine plain and, 1 cc kenalog 10. Disposition: Patient tolerated procedure well. Injection site dressed with a band-aid.  Post-injection care was discussed and return precautions discussed.   Trula Slade DPM

## 2019-04-22 DIAGNOSIS — M25512 Pain in left shoulder: Secondary | ICD-10-CM | POA: Diagnosis not present

## 2019-04-22 DIAGNOSIS — Z9889 Other specified postprocedural states: Secondary | ICD-10-CM | POA: Diagnosis not present

## 2019-04-26 ENCOUNTER — Encounter: Payer: Self-pay | Admitting: Obstetrics and Gynecology

## 2019-04-26 DIAGNOSIS — Z1231 Encounter for screening mammogram for malignant neoplasm of breast: Secondary | ICD-10-CM | POA: Diagnosis not present

## 2019-04-27 DIAGNOSIS — M25512 Pain in left shoulder: Secondary | ICD-10-CM | POA: Diagnosis not present

## 2019-04-29 DIAGNOSIS — M25512 Pain in left shoulder: Secondary | ICD-10-CM | POA: Diagnosis not present

## 2019-04-30 ENCOUNTER — Emergency Department: Payer: PPO

## 2019-04-30 ENCOUNTER — Other Ambulatory Visit: Payer: Self-pay

## 2019-04-30 DIAGNOSIS — W101XXA Fall (on)(from) sidewalk curb, initial encounter: Secondary | ICD-10-CM | POA: Diagnosis not present

## 2019-04-30 DIAGNOSIS — Y999 Unspecified external cause status: Secondary | ICD-10-CM | POA: Diagnosis not present

## 2019-04-30 DIAGNOSIS — S299XXA Unspecified injury of thorax, initial encounter: Secondary | ICD-10-CM | POA: Diagnosis not present

## 2019-04-30 DIAGNOSIS — Z79899 Other long term (current) drug therapy: Secondary | ICD-10-CM | POA: Diagnosis not present

## 2019-04-30 DIAGNOSIS — Y92512 Supermarket, store or market as the place of occurrence of the external cause: Secondary | ICD-10-CM | POA: Insufficient documentation

## 2019-04-30 DIAGNOSIS — Y9301 Activity, walking, marching and hiking: Secondary | ICD-10-CM | POA: Insufficient documentation

## 2019-04-30 DIAGNOSIS — S20212A Contusion of left front wall of thorax, initial encounter: Secondary | ICD-10-CM | POA: Diagnosis not present

## 2019-04-30 DIAGNOSIS — R0781 Pleurodynia: Secondary | ICD-10-CM | POA: Diagnosis not present

## 2019-04-30 DIAGNOSIS — I10 Essential (primary) hypertension: Secondary | ICD-10-CM | POA: Insufficient documentation

## 2019-04-30 NOTE — ED Triage Notes (Signed)
Patient reports going out of store door and trip this afternoon.  Reports having left sided rib pain since.

## 2019-05-01 ENCOUNTER — Emergency Department
Admission: EM | Admit: 2019-05-01 | Discharge: 2019-05-01 | Disposition: A | Discharge status: Home / Self Care | Payer: PPO | Attending: Emergency Medicine | Admitting: Emergency Medicine

## 2019-05-01 DIAGNOSIS — W19XXXA Unspecified fall, initial encounter: Secondary | ICD-10-CM

## 2019-05-01 DIAGNOSIS — S20212A Contusion of left front wall of thorax, initial encounter: Secondary | ICD-10-CM

## 2019-05-01 MED ORDER — HYDROCODONE-ACETAMINOPHEN 5-325 MG PO TABS: 1.0000 | ORAL_TABLET | Freq: Four times a day (QID) | ORAL | 0 refills | Status: DC | PRN

## 2019-05-01 MED ORDER — HYDROCODONE-ACETAMINOPHEN 5-325 MG PO TABS
1.0000 | ORAL_TABLET | Freq: Once | ORAL | Status: AC
  Administered 2019-05-01: 1 via ORAL
  Filled 2019-05-01: qty 1

## 2019-05-01 NOTE — ED Provider Notes (Signed)
[facility] Emergency Department Provider Note   ____________________________________________   First MD Initiated Contact with Patient 05/01/19 0226     (approximate)  I have reviewed the triage vital signs and the nursing notes.   HISTORY  Chief Complaint Fall    HPI Claudia Terry is a 73 y.o. female who presents to the ED from home status post mechanical fall.  Patient was going out of the [employer] store when she tripped and fell, striking her left side.  Reports left rotator cuff surgery 7 weeks ago.  She reflexively tucked her left shoulder into her ribs and fell, striking her ribs.  Denies striking head or LOC.  Complains of left-sided rib pain since.  Denies fever, cough, shortness of breath, abdominal pain, nausea, vomiting or dizziness.  Denies anticoagulant use.       Past Medical History:  Diagnosis Date  . Arthritis   . 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 GERD (gastroesophageal reflux disease) 8/01    Dr. Tiffany Kocher  . Hypertension   . Low back pain 11/95  . Menopause   . Restless leg syndrome 1999    Patient Active Problem List   Diagnosis Date Noted  . Generalized osteoarthritis of multiple sites 07/28/2018  . Varicose veins of both legs with edema 07/28/2018  . FH: colon polyps 02/10/2018  . S/P total knee arthroplasty 07/07/2017  . Status post total left knee replacement 10/28/2016  . Failed total hip arthroplasty with dislocation ([place]) 12/01/2015  . S/P closed reduction of dislocated total hip prosthesis 08/08/2015  . S/P total hip arthroplasty 06/14/2015  . Benign essential HTN 12/28/2013  . Acid reflux 12/28/2013  . HLD (hyperlipidemia) 12/28/2013  . Restless leg 12/28/2013    Past Surgical History:  Procedure Laterality Date  . [place]   X2  . COLONOSCOPY  12/06/11   normal  . COLONOSCOPY WITH PROPOFOL N/A 04/13/2018   Procedure: COLONOSCOPY WITH  PROPOFOL;  Surgeon: Manya Silvas, MD;  Location: [facility] ENDOSCOPY;  Service: Endoscopy;  Laterality: N/A;  . ESOPHAGOGASTRODUODENOSCOPY (EGD) WITH PROPOFOL N/A 02/23/2015   Procedure: ESOPHAGOGASTRODUODENOSCOPY (EGD) WITH PROPOFOL;  Surgeon: Manya Silvas, MD;  Location: [facility] ENDOSCOPY;  Service: Endoscopy;  Laterality: N/A;  . ESOPHAGOGASTRODUODENOSCOPY (EGD) WITH PROPOFOL N/A 04/13/2018   Procedure: ESOPHAGOGASTRODUODENOSCOPY (EGD) WITH PROPOFOL;  Surgeon: Manya Silvas, MD;  Location: [facility] ENDOSCOPY;  Service: Endoscopy;  Laterality: N/A;  . HIP CLOSED REDUCTION Left 08/08/2015   Procedure: CLOSED REDUCTION HIP;  Surgeon: Dereck Leep, MD;  Location: [facility] ORS;  Service: Orthopedics;  Laterality: Left;  . HIP CLOSED REDUCTION Left 09/18/2015   Procedure: CLOSED REDUCTION HIP;  Surgeon: Hessie Knows, MD;  Location: [facility] ORS;  Service: Orthopedics;  Laterality: Left;  . HIP CLOSED REDUCTION Left 12/01/2015   Procedure: CLOSED REDUCTION HIP;  Surgeon: Dereck Leep, MD;  Location: [facility] ORS;  Service: Orthopedics;  Laterality: Left;  no prep no incision  . JOINT REPLACEMENT  10/2016   left knee  . KNEE ARTHROPLASTY Left 10/28/2016   Procedure: COMPUTER ASSISTED TOTAL KNEE ARTHROPLASTY;  Surgeon: Dereck Leep, MD;  Location: [facility] ORS;  Service: Orthopedics;  Laterality: Left;  . KNEE ARTHROPLASTY Right 07/07/2017   Procedure: COMPUTER ASSISTED TOTAL KNEE ARTHROPLASTY;  Surgeon: Dereck Leep, MD;  Location: [facility] ORS;  Service: Orthopedics;  Laterality: Right;  . KNEE ARTHROSCOPY Right 11/14/2014   Procedure: ARTHROSCOPY  KNEE;  Surgeon: Dereck Leep, MD;  Location: [facility] ORS;  Service: Orthopedics;  Laterality: Right;  Posterior horn menicus, anterior lateral menicus grade 3 chondromalacia  . phlebitis groin    . SHOULDER ARTHROSCOPY WITH OPEN ROTATOR CUFF REPAIR Left 03/10/2019   Procedure: LEFT SHOULDER ARTHROSCOPY, ARTHROSCOPIC ROTATOR CUFF REPAIR, DISTAL CLAVICLE EXCISION, SUBACROMIAL  DECOMPRESSION, INTRA-ARTICULAR DEBRIDEMENT ;  Surgeon: Lovell Sheehan, MD;  Location: [facility] ORS;  Service: Orthopedics;  Laterality: Left;  . TONSILLECTOMY    . TOTAL HIP ARTHROPLASTY Left 05/08/11   necrosis of hip  . TOTAL HIP ARTHROPLASTY Right 06/14/2015   Procedure: TOTAL HIP ARTHROPLASTY;  Surgeon: Dereck Leep, MD;  Location: [facility] ORS;  Service: Orthopedics;  Laterality: Right;  . TOTAL HIP REVISION Left 02/21/2016   Procedure: TOTAL HIP REVISION;  Surgeon: Dereck Leep, MD;  Location: [facility] ORS;  Service: Orthopedics;  Laterality: Left;    Prior to Admission medications   Medication Sig Start Date End Date Taking? Authorizing Provider  atenolol (TENORMIN) 50 MG tablet TAKE 1 TABLET BY MOUTH TWICE A DAY 01/12/19   [provider]  Biotin 5000 MCG TABS Take 10,000 mcg by mouth daily.     [provider]  Calcium-Phosphorus-Vitamin D (CITRACAL +D3 PO) Take 1 tablet by mouth 2 (two) times daily.     [provider]  celecoxib (CELEBREX) 100 MG capsule Take 2 capsules (200 mg total) by mouth daily. 04/19/19   Trula Slade, DPM  Coenzyme Q10 (CO Q-10) 200 MG CAPS Take 200 mg by mouth daily.    [provider]  diazepam (VALIUM) 5 MG tablet Take 1 tablet (5 mg total) by mouth at bedtime as needed for muscle spasms. 03/15/19 03/14/20  Rudene Re, MD  docusate sodium (COLACE) 100 MG capsule Take 1 capsule (100 mg total) by mouth daily as needed. 03/10/19 03/09/20  Lovell Sheehan, MD  HYDROcodone-acetaminophen (NORCO) 5-325 MG tablet Take 1 tablet by mouth every 6 (six) hours as needed for moderate pain. 05/01/19   Paulette Blanch, MD  Influenza Vac A&B Surf Ant Adj (FLUAD) 0.5 ML SUSY Fluad 2019-20 51yr up(PF)45 mcg(15 mcgx3)/0.5 mL intramuscular syringe  ADM 0.5ML IM UTD    [provider]  Melatonin 10 MG TABS Take 10 mg by mouth at bedtime.    [provider]  Menthol, Topical Analgesic, (BIOFREEZE EX) Apply 1 application topically  daily as needed (foot pain).    [provider]  Multiple Vitamin (MULTIVITAMIN) tablet Take 1 tablet by mouth daily. Vitamin c, super b complex, nerve shield plus    [provider]  omeprazole (PRILOSEC) 20 MG capsule Take by mouth. 11/10/18   [provider]  pramipexole (MIRAPEX) 1.5 MG tablet Take 1.5 mg by mouth at bedtime.     [provider]  Red Yeast Rice 600 MG TABS Take 600 mg by mouth 2 (two) times daily.     [provider]    Allergies Penicillins, Erythromycin, and Tetracyclines & related  Family History  Problem Relation Age of Onset  . Diabetes Mother   . Diabetes Maternal Grandfather   . Hypertension Maternal Grandfather   . Ovarian cancer Paternal Aunt   . Cancer - Lung Paternal Uncle     Social History Social History   Tobacco Use  . Smoking status: Never Smoker  . Smokeless tobacco: Never Used  Substance Use Topics  . Alcohol use: No  . Drug use: No    Review of  Systems  Constitutional: No fever/chills Eyes: No visual changes. ENT: No sore throat. Cardiovascular: Denies chest pain. Respiratory: Positive for left rib pain.  Denies shortness of breath. Gastrointestinal: No abdominal pain.  No nausea, no vomiting.  No diarrhea.  No constipation. Genitourinary: Negative for dysuria. Musculoskeletal: Negative for back pain. Skin: Negative for rash. Neurological: Negative for headaches, focal weakness or numbness.   ____________________________________________   PHYSICAL EXAM:  VITAL SIGNS: ED Triage Vitals  Enc Vitals Group     BP 04/30/19 2000 (!) 146/73     Pulse Rate 04/30/19 2000 83     Resp 04/30/19 2000 18     Temp 04/30/19 2000 98.7 F (37.1 C)     Temp Source 04/30/19 2000 Oral     SpO2 04/30/19 2000 98 %     Weight 04/30/19 1957 185 lb (83.9 kg)     Height 04/30/19 1957 5\' 4"  (1.626 m)     Head Circumference --      Peak Flow --      Pain Score 04/30/19 1957 7     Pain Loc --       Pain Edu? --      Excl. in [place]? --     Constitutional: Alert and oriented. Well appearing and in mild acute distress. Eyes: Conjunctivae are normal. PERRL. EOMI. Head: Atraumatic. Nose: Atraumatic. Mouth/Throat: Mucous membranes are moist.  No dental malocclusion. Neck: No stridor.  No cervical spine tenderness to palpation. Cardiovascular: Normal rate, regular rhythm. Grossly normal heart sounds.  Good peripheral circulation. Respiratory: Normal respiratory effort.  No retractions. Lungs CTAB.  Mild splinting.  No crepitus.  Left lower lateral ribs tender to palpation. Gastrointestinal: Soft and nontender to light or deep palpation, particularly in left upper quadrant. No distention. No abdominal bruits. No CVA tenderness. Musculoskeletal: No lower extremity tenderness nor edema.  No joint effusions. Mild small patch of bruising to left humerus. Neurologic:  Normal speech and language. No gross focal neurologic deficits are appreciated. No gait instability. Skin:  Skin is warm, dry and intact. No rash noted. Psychiatric: Mood and affect are normal. Speech and behavior are normal.  ____________________________________________   LABS (all labs ordered are listed, but only abnormal results are displayed)  Labs Reviewed - No data to display ____________________________________________  EKG  None ____________________________________________  RADIOLOGY  ED MD interpretation: No displaced rib fractures; no pneumothorax  Official radiology report(s): Dg Ribs Unilateral W/chest Left  Result Date: 04/30/2019 CLINICAL DATA:  Pain after fall EXAM: LEFT RIBS AND CHEST - 3+ VIEW COMPARISON:  CTA chest 03/15/2019 FINDINGS: Streaky basilar opacities likely reflecting areas of atelectasis possibly due to splinting. No visible displaced rib fracture. No pneumothorax or effusion. No other acute osseous abnormality is seen. Degenerative changes are present in the imaged spine and shoulders.  Findings most pronounced in the right shoulder with high-riding humeral head suggesting underlying rotator cuff insufficiency. No focal consolidation or evidence of edema. Stable cardiomediastinal contours with a calcified, slightly tortuous aorta. IMPRESSION: 1. No visible displaced rib fracture. No pneumothorax. 2. Streaky basilar opacities likely reflect areas of atelectasis possibly due to splinting. 3.  Aortic Atherosclerosis (ICD10-I70.0). Electronically Signed   By: Lovena Le M.D.   On: 04/30/2019 20:41    ____________________________________________   PROCEDURES  Procedure(s) performed (including Critical Care):  Procedures   ____________________________________________   INITIAL IMPRESSION / ASSESSMENT AND PLAN / ED COURSE  As part of my medical decision making, I reviewed the following data within the electronic medical  record:  Nursing notes reviewed and incorporated, Old chart reviewed, Radiograph reviewed, Notes from prior ED visits and [place] Controlled Substance Database     Claudia Terry was evaluated in Emergency Department on 05/01/2019 for the symptoms described in the history of present illness. She was evaluated in the context of the global COVID-19 pandemic, which necessitated consideration that the patient might be at risk for infection with the SARS-CoV-2 virus that causes COVID-19. Institutional protocols and algorithms that pertain to the evaluation of patients at risk for COVID-19 are in a state of rapid change based on information released by regulatory bodies including the CDC and federal and state organizations. These policies and algorithms were followed during the patient's care in the ED.    73 year old female who presents with left rib pain status post mechanical fall.  X-rays do not demonstrate rib fractures or pneumothorax.  Will treat with Norco, incentive spirometer, ice.  Strict return precautions given.  Patient and spouse verbalized understanding agree  with plan of care.      ____________________________________________   FINAL CLINICAL IMPRESSION(S) / ED DIAGNOSES  Final diagnoses:  Fall, initial encounter  Rib contusion, left, initial encounter     ED Discharge Orders         Ordered    HYDROcodone-acetaminophen (NORCO) 5-325 MG tablet  Every 6 hours PRN,   Status:  Discontinued     05/01/19 0237    HYDROcodone-acetaminophen (NORCO) 5-325 MG tablet  Every 6 hours PRN     05/01/19 0328           Note:  This document was prepared using Dragon voice recognition software and may include unintentional dictation errors.   Paulette Blanch, MD 05/01/19 [id number]

## 2019-05-01 NOTE — Discharge Instructions (Signed)
1.  You may take Norco as needed for pain. 2.  Apply ice to affected area several times daily. 3.  Use incentive spirometer as instructed. 4.  Return to the ER for worsening symptoms, persistent vomiting, difficulty breathing, fever or other concerns.

## 2019-05-04 DIAGNOSIS — S2020XA Contusion of thorax, unspecified, initial encounter: Secondary | ICD-10-CM | POA: Diagnosis not present

## 2019-05-04 DIAGNOSIS — M25512 Pain in left shoulder: Secondary | ICD-10-CM | POA: Diagnosis not present

## 2019-05-11 DIAGNOSIS — M25512 Pain in left shoulder: Secondary | ICD-10-CM | POA: Diagnosis not present

## 2019-05-13 DIAGNOSIS — M25512 Pain in left shoulder: Secondary | ICD-10-CM | POA: Diagnosis not present

## 2019-05-18 DIAGNOSIS — M25512 Pain in left shoulder: Secondary | ICD-10-CM | POA: Diagnosis not present

## 2019-05-21 DIAGNOSIS — Z9889 Other specified postprocedural states: Secondary | ICD-10-CM | POA: Diagnosis not present

## 2019-05-21 DIAGNOSIS — M25512 Pain in left shoulder: Secondary | ICD-10-CM | POA: Diagnosis not present

## 2019-05-25 DIAGNOSIS — M25512 Pain in left shoulder: Secondary | ICD-10-CM | POA: Diagnosis not present

## 2019-05-27 DIAGNOSIS — M25512 Pain in left shoulder: Secondary | ICD-10-CM | POA: Diagnosis not present

## 2019-06-06 DIAGNOSIS — M25512 Pain in left shoulder: Secondary | ICD-10-CM | POA: Diagnosis not present

## 2019-06-10 DIAGNOSIS — Z9889 Other specified postprocedural states: Secondary | ICD-10-CM | POA: Diagnosis not present

## 2019-06-10 DIAGNOSIS — M75122 Complete rotator cuff tear or rupture of left shoulder, not specified as traumatic: Secondary | ICD-10-CM | POA: Diagnosis not present

## 2019-06-13 ENCOUNTER — Other Ambulatory Visit: Payer: Self-pay | Admitting: Orthopedic Surgery

## 2019-06-17 ENCOUNTER — Other Ambulatory Visit: Payer: Self-pay

## 2019-06-17 ENCOUNTER — Other Ambulatory Visit
Admission: RE | Admit: 2019-06-17 | Discharge: 2019-06-17 | Disposition: A | Discharge status: Home / Self Care | Payer: PPO | Source: Ambulatory Visit | Attending: Orthopedic Surgery | Admitting: Orthopedic Surgery

## 2019-06-17 DIAGNOSIS — Z20822 Contact with and (suspected) exposure to covid-19: Secondary | ICD-10-CM | POA: Insufficient documentation

## 2019-06-17 DIAGNOSIS — Z01812 Encounter for preprocedural laboratory examination: Secondary | ICD-10-CM | POA: Diagnosis not present

## 2019-06-18 LAB — SARS CORONAVIRUS 2 (TAT 6-24 HRS): SARS Coronavirus 2: NEGATIVE

## 2019-06-21 ENCOUNTER — Ambulatory Visit: Payer: PPO | Admitting: Anesthesiology

## 2019-06-21 ENCOUNTER — Encounter: Payer: Self-pay | Admitting: Orthopedic Surgery

## 2019-06-21 ENCOUNTER — Ambulatory Visit
Admission: RE | Admit: 2019-06-21 | Discharge: 2019-06-21 | Disposition: A | Discharge status: Home / Self Care | Payer: PPO | Attending: Orthopedic Surgery | Admitting: Orthopedic Surgery

## 2019-06-21 ENCOUNTER — Encounter
Admission: RE | Disposition: A | Discharge status: Home / Self Care | Payer: Self-pay | Source: Home / Self Care | Attending: Orthopedic Surgery

## 2019-06-21 DIAGNOSIS — M24012 Loose body in left shoulder: Secondary | ICD-10-CM | POA: Diagnosis not present

## 2019-06-21 DIAGNOSIS — I10 Essential (primary) hypertension: Secondary | ICD-10-CM | POA: Insufficient documentation

## 2019-06-21 DIAGNOSIS — K219 Gastro-esophageal reflux disease without esophagitis: Secondary | ICD-10-CM | POA: Insufficient documentation

## 2019-06-21 DIAGNOSIS — M719 Bursopathy, unspecified: Secondary | ICD-10-CM | POA: Insufficient documentation

## 2019-06-21 DIAGNOSIS — M75122 Complete rotator cuff tear or rupture of left shoulder, not specified as traumatic: Secondary | ICD-10-CM | POA: Diagnosis not present

## 2019-06-21 DIAGNOSIS — Z79899 Other long term (current) drug therapy: Secondary | ICD-10-CM | POA: Insufficient documentation

## 2019-06-21 DIAGNOSIS — M7552 Bursitis of left shoulder: Secondary | ICD-10-CM | POA: Diagnosis not present

## 2019-06-21 DIAGNOSIS — M65812 Other synovitis and tenosynovitis, left shoulder: Secondary | ICD-10-CM | POA: Insufficient documentation

## 2019-06-21 DIAGNOSIS — M25512 Pain in left shoulder: Secondary | ICD-10-CM | POA: Diagnosis not present

## 2019-06-21 DIAGNOSIS — G8918 Other acute postprocedural pain: Secondary | ICD-10-CM | POA: Diagnosis not present

## 2019-06-21 HISTORY — PX: SHOULDER ARTHROSCOPY WITH ROTATOR CUFF REPAIR: SHX5685

## 2019-06-21 SURGERY — ARTHROSCOPY, SHOULDER, WITH ROTATOR CUFF REPAIR
Anesthesia: General | Site: Shoulder | Laterality: Left

## 2019-06-21 MED ORDER — OXYCODONE HCL 5 MG/5ML PO SOLN: 5.0000 mg | Freq: Once | ORAL | Status: DC | PRN

## 2019-06-21 MED ORDER — MIDAZOLAM HCL 2 MG/2ML IJ SOLN
INTRAMUSCULAR | Status: DC | PRN
  Administered 2019-06-21: 2 mg via INTRAVENOUS

## 2019-06-21 MED ORDER — DEXAMETHASONE SODIUM PHOSPHATE 4 MG/ML IJ SOLN
INTRAMUSCULAR | Status: DC | PRN
  Administered 2019-06-21: 4 mg via INTRAVENOUS

## 2019-06-21 MED ORDER — LACTATED RINGERS IV SOLN: INTRAVENOUS | Status: DC

## 2019-06-21 MED ORDER — EPHEDRINE SULFATE 50 MG/ML IJ SOLN
INTRAMUSCULAR | Status: DC | PRN
  Administered 2019-06-21: 10 mg via INTRAVENOUS
  Administered 2019-06-21 (×5): 5 mg via INTRAVENOUS
  Administered 2019-06-21: 10 mg via INTRAVENOUS

## 2019-06-21 MED ORDER — CLINDAMYCIN PHOSPHATE 900 MG/50ML IV SOLN
900.0000 mg | INTRAVENOUS | Status: AC
  Administered 2019-06-21: 900 mg via INTRAVENOUS

## 2019-06-21 MED ORDER — FENTANYL CITRATE (PF) 100 MCG/2ML IJ SOLN: 25.0000 ug | INTRAMUSCULAR | Status: DC | PRN

## 2019-06-21 MED ORDER — EPINEPHRINE (ANAPHYLAXIS) 30 MG/30ML IJ SOLN
INTRAMUSCULAR | Status: DC | PRN
  Administered 2019-06-21: 4 mg

## 2019-06-21 MED ORDER — HYDROCODONE-ACETAMINOPHEN 5-325 MG PO TABS: 1.0000 | ORAL_TABLET | ORAL | 0 refills | Status: DC | PRN

## 2019-06-21 MED ORDER — ACETAMINOPHEN 325 MG PO TABS
650.0000 mg | ORAL_TABLET | Freq: Once | ORAL | Status: AC
  Administered 2019-06-21: 650 mg via ORAL

## 2019-06-21 MED ORDER — CHLORHEXIDINE GLUCONATE 4 % EX LIQD: 60.0000 mL | Freq: Once | CUTANEOUS | Status: DC

## 2019-06-21 MED ORDER — ONDANSETRON HCL 4 MG/2ML IJ SOLN: 4.0000 mg | Freq: Once | INTRAMUSCULAR | Status: DC | PRN

## 2019-06-21 MED ORDER — FENTANYL CITRATE (PF) 100 MCG/2ML IJ SOLN
INTRAMUSCULAR | Status: DC | PRN
  Administered 2019-06-21: 50 ug via INTRAVENOUS

## 2019-06-21 MED ORDER — BUPIVACAINE HCL (PF) 0.5 % IJ SOLN
INTRAMUSCULAR | Status: DC | PRN
  Administered 2019-06-21: 20 mL via PERINEURAL

## 2019-06-21 MED ORDER — PROPOFOL 10 MG/ML IV BOLUS
INTRAVENOUS | Status: DC | PRN
  Administered 2019-06-21: 50 mg via INTRAVENOUS
  Administered 2019-06-21: 150 mg via INTRAVENOUS

## 2019-06-21 MED ORDER — LIDOCAINE HCL (CARDIAC) PF 100 MG/5ML IV SOSY
PREFILLED_SYRINGE | INTRAVENOUS | Status: DC | PRN
  Administered 2019-06-21: 40 mg via INTRATRACHEAL

## 2019-06-21 MED ORDER — ONDANSETRON HCL 4 MG/2ML IJ SOLN
INTRAMUSCULAR | Status: DC | PRN
  Administered 2019-06-21: 4 mg via INTRAVENOUS

## 2019-06-21 MED ORDER — BUPIVACAINE LIPOSOME 1.3 % IJ SUSP
INTRAMUSCULAR | Status: DC | PRN
  Administered 2019-06-21: 20 mL via PERINEURAL

## 2019-06-21 MED ORDER — OXYCODONE HCL 5 MG PO TABS: 5.0000 mg | ORAL_TABLET | Freq: Once | ORAL | Status: DC | PRN

## 2019-06-21 MED ORDER — GLYCOPYRROLATE 0.2 MG/ML IJ SOLN
INTRAMUSCULAR | Status: DC | PRN
  Administered 2019-06-21: .1 mg via INTRAVENOUS

## 2019-06-21 SURGICAL SUPPLY — 55 items
ADAPTER IRRIG TUBE 2 SPIKE SOL (ADAPTER) ×6 IMPLANT
ADPR TBG 2 SPK PMP STRL ASCP (ADAPTER) ×2
APL PRP STRL LF DISP 70% ISPRP (MISCELLANEOUS) ×1
BLADE AGGRESSIVE PLUS 4.0 (BLADE) IMPLANT
BLADE FULL RADIUS 3.5 (BLADE) ×1 IMPLANT
BLADE INCISOR PLUS 4.5 (BLADE) ×3 IMPLANT
BRUSH SCRUB EZ  4% CHG (MISCELLANEOUS) ×2
BRUSH SCRUB EZ 4% CHG (MISCELLANEOUS) ×1 IMPLANT
BUR BR 5.5 WIDE MOUTH (BURR) ×2 IMPLANT
CANNULA SHOULDER 7CM (CANNULA) ×3 IMPLANT
CHLORAPREP W/TINT 26 (MISCELLANEOUS) ×3 IMPLANT
COOLER POLAR GLACIER W/PUMP (MISCELLANEOUS) ×3 IMPLANT
COVER WAND RF STERILE (DRAPES) ×3 IMPLANT
DRAPE IMP U-DRAPE 54X76 (DRAPES) ×6 IMPLANT
DRAPE SHEET LG 3/4 BI-LAMINATE (DRAPES) ×3 IMPLANT
DRAPE STERI 35X30 U-POUCH (DRAPES) ×3 IMPLANT
GAUZE SPONGE 4X4 12PLY STRL (GAUZE/BANDAGES/DRESSINGS) ×3 IMPLANT
GAUZE XEROFORM 1X8 LF (GAUZE/BANDAGES/DRESSINGS) ×3 IMPLANT
GLOVE BIO SURGEON STRL SZ 6.5 (GLOVE) ×1 IMPLANT
GLOVE BIO SURGEONS STRL SZ 6.5 (GLOVE) ×1
GLOVE BIOGEL PI IND STRL 6.5 (GLOVE) IMPLANT
GLOVE BIOGEL PI IND STRL 8 (GLOVE) ×1 IMPLANT
GLOVE BIOGEL PI INDICATOR 6.5 (GLOVE) ×2
GLOVE BIOGEL PI INDICATOR 8 (GLOVE) ×2
GLOVE SURG ORTHO 8.0 STRL STRW (GLOVE) ×3 IMPLANT
GOWN STRL REUS W/ TWL LRG LVL3 (GOWN DISPOSABLE) ×1 IMPLANT
GOWN STRL REUS W/ TWL XL LVL3 (GOWN DISPOSABLE) ×1 IMPLANT
GOWN STRL REUS W/TWL LRG LVL3 (GOWN DISPOSABLE) ×3
GOWN STRL REUS W/TWL XL LVL3 (GOWN DISPOSABLE) ×3
IV LACTATED RINGER IRRG 3000ML (IV SOLUTION) ×12
IV LR IRRIG 3000ML ARTHROMATIC (IV SOLUTION) ×6 IMPLANT
KIT STABILIZATION SHOULDER (MISCELLANEOUS) ×3 IMPLANT
KIT TURNOVER KIT A (KITS) ×3 IMPLANT
MANIFOLD NEPTUNE II (INSTRUMENTS) ×6 IMPLANT
MASK FACE SPIDER DISP (MASK) ×3 IMPLANT
MAT ABSORB  FLUID 56X50 GRAY (MISCELLANEOUS) ×2
MAT ABSORB FLUID 56X50 GRAY (MISCELLANEOUS) ×1 IMPLANT
NDL SCORPION MULTI FIRE (NEEDLE) IMPLANT
NEEDLE SCORPION MULTI FIRE (NEEDLE) IMPLANT
PACK ARTHROSCOPY SHOULDER (MISCELLANEOUS) ×3 IMPLANT
PAD ABD DERMACEA PRESS 5X9 (GAUZE/BANDAGES/DRESSINGS) IMPLANT
SLING ARM LRG DEEP (SOFTGOODS) IMPLANT
SLING ARM M TX990204 (SOFTGOODS) ×2 IMPLANT
STRAP BODY AND KNEE 60X3 (MISCELLANEOUS) ×2 IMPLANT
SUT ETHILON 3-0 (SUTURE) ×2 IMPLANT
SUT FIBERWIRE #2 38 T-5 BLUE (SUTURE)
SUT TIGER TAPE 7 IN WHITE (SUTURE) IMPLANT
SUTURE FIBERWR #2 38 T-5 BLUE (SUTURE) IMPLANT
SYR 10ML LL (SYRINGE) ×3 IMPLANT
SYR 50ML LL SCALE MARK (SYRINGE) ×3 IMPLANT
TAPE MICROFOAM 4IN (TAPE) ×3 IMPLANT
TUBING ARTHRO INFLOW-ONLY STRL (TUBING) ×3 IMPLANT
TUBING CONNECTING 10 (TUBING) ×2 IMPLANT
TUBING CONNECTING 10' (TUBING) ×1
WAND WEREWOLF FLOW 90D (MISCELLANEOUS) ×3 IMPLANT

## 2019-06-21 NOTE — Op Note (Signed)
06/21/2019  3:38 PM  PATIENT:  Claudia Terry    PRE-OPERATIVE DIAGNOSIS:  M75.122 Complete rotator cuff tear or rupture of left shoulder, traumatic  POST-OPERATIVE DIAGNOSIS:  Same  PROCEDURE:  LEFT SHOULDER ARTHROSCOPY WITH EXTENSIVE INTRA-ARTICULAR DEBRIDEMENT and REMOVAL OF HARDWARE  SURGEON:  Lovell Sheehan, MD  ANESTHESIA:   General  PREOPERATIVE INDICATIONS:  Claudia Terry is a  74 y.o. female with a diagnosis of M75.122 Complete rotator cuff tear or rupture of left shoulder not specified as traumatic who failed conservative measures and elected for surgical management.    I discussed the risks and benefits of surgery. The risks include but are not limited to infection, bleeding requiring blood transfusion, nerve or blood vessel injury, joint stiffness or loss of motion, persistent pain, weakness or instability, malunion, nonunion and hardware failure and the need for further surgery. Medical risks include but are not limited to DVT and pulmonary embolism, myocardial infarction, stroke, pneumonia, respiratory failure and death. Patient understood these risks and wished to proceed.   OPERATIVE FINDINGS: Extensive synovitis, bursitis and massive tear of the rotator cuff was identified. Suture and anchor material were found in the subacromial space. The subscapularis was intact. There was no anterior or posterior instability. Sulcus sign was negative. There was limited forward flexion and abduction.  OPERATIVE PROCEDURE: The patient was met in the preoperative area. The left shoulder was signed with the word yes and my initials according the hospital's correct site of surgery protocol.  History and physical was updated. Patient was brought to the operating room where he underwent interscalene block and general anesthesia. The patient was placed in a beachchair position.  A spider arm positioner was used for this case. Examination under anesthesia revealed no instability with load shift  testing. The patient had a negative sulcus sign. The shoulder was manipulated gently into full forward flexion and abduction.  Patient was prepped and draped in a sterile fashion. A timeout was performed to verify the patient's name, date of birth, medical record number, correct site of surgery and correct procedure to be performed there was also used to verify the patient received antibiotics that all appropriate instruments, implants and radiographs studies were available in the room. Once all in attendance were in agreement case began.  Bony landmarks were drawn out with a surgical marker along with proposed arthroscopy incisions. These were pre-injected with 1% lidocaine plain. An 11 blade was used to establish a posterior portal through which the arthroscope was placed in the glenohumeral joint. A full diagnostic examination of the shoulder was performed. Please see findings for a complete report. The shaver was used to debride scar tissue anteriorly and posteriorly within the joint. A gentle chondroplasty was performed. Small loose bodies were identified and removed.  The arthroscope was then placed in the subacromial space.  Extensive bursitis was encountered. A lateral portal was established with an 18-gauge spinal needle for localization. A 90 ArthroCare wand and shaver blade were used to form an extensive subdeltoid and subacromial bursectomy. There was a massive rotator cuff tear with fiber tape material exposed. Using the shaver and grasper the suture material was removed. The remaining cuff was debrided back to a stable border.   Skin closure for the arthroscopic incisions was performed with 3-0 nylon.  A dry sterile dressing was applied.  The patient was placed in a sling.  All sharp and it instrument counts were correct at the conclusion of the case. I was scrubbed and present for  the entire case. I spoke with the patient's family postoperatively to let them know the case had been performed  without complication and the patient was stable in recovery room.

## 2019-06-21 NOTE — Anesthesia Preprocedure Evaluation (Signed)
Anesthesia Evaluation  Patient identified by MRN, date of birth, ID band Patient awake    Reviewed: Allergy & Precautions, H&P , NPO status , Patient's Chart, lab work & pertinent test results, reviewed documented beta blocker date and time   Airway Mallampati: II  TM Distance: >3 FB Neck ROM: full    Dental no notable dental hx.    Pulmonary neg pulmonary ROS,    Pulmonary exam normal breath sounds clear to auscultation       Cardiovascular Exercise Tolerance: Good hypertension,  Rhythm:regular Rate:Normal     Neuro/Psych negative neurological ROS  negative psych ROS   GI/Hepatic Neg liver ROS, GERD  ,  Endo/Other  negative endocrine ROS  Renal/GU negative Renal ROS  negative genitourinary   Musculoskeletal   Abdominal   Peds  Hematology negative hematology ROS (+)   Anesthesia Other Findings   Reproductive/Obstetrics negative OB ROS                             Anesthesia Physical Anesthesia Plan  ASA: II  Anesthesia Plan: General   Post-op Pain Management: GA combined w/ Regional for post-op pain   Induction:   PONV Risk Score and Plan: 3 and Ondansetron, Dexamethasone and Treatment may vary due to age or medical condition  Airway Management Planned:   Additional Equipment:   Intra-op Plan:   Post-operative Plan:   Informed Consent: I have reviewed the patients History and Physical, chart, labs and discussed the procedure including the risks, benefits and alternatives for the proposed anesthesia with the patient or authorized representative who has indicated his/her understanding and acceptance.     Dental Advisory Given  Plan Discussed with: CRNA  Anesthesia Plan Comments:         Anesthesia Quick Evaluation

## 2019-06-21 NOTE — Transfer of Care (Signed)
Immediate Anesthesia Transfer of Care Note  Patient: Claudia Terry  Procedure(s) Performed: SHOULDER ARTHROSCOPY WITH EXTENSIVE INTRAARTICULAR DEBRIDEMENT WITH REMOVAL OF HARDWARE (Left Shoulder)  Patient Location: PACU  Anesthesia Type: General  Level of Consciousness: awake, alert  and patient cooperative  Airway and Oxygen Therapy: Patient Spontanous Breathing and Patient connected to supplemental oxygen  Post-op Assessment: Post-op Vital signs reviewed, Patient's Cardiovascular Status Stable, Respiratory Function Stable, Patent Airway and No signs of Nausea or vomiting  Post-op Vital Signs: Reviewed and stable  Complications: No apparent anesthesia complications

## 2019-06-21 NOTE — Anesthesia Postprocedure Evaluation (Signed)
Anesthesia Post Note  Patient: Claudia Terry  Procedure(s) Performed: SHOULDER ARTHROSCOPY WITH EXTENSIVE INTRAARTICULAR DEBRIDEMENT WITH REMOVAL OF HARDWARE (Left Shoulder)     Patient location during evaluation: PACU Anesthesia Type: General and Regional Level of consciousness: awake and alert Pain management: pain level controlled Vital Signs Assessment: post-procedure vital signs reviewed and stable Respiratory status: spontaneous breathing, nonlabored ventilation, respiratory function stable and patient connected to nasal cannula oxygen Cardiovascular status: blood pressure returned to baseline and stable Postop Assessment: no apparent nausea or vomiting Anesthetic complications: no    Alisa Graff

## 2019-06-21 NOTE — H&P (Signed)
The patient has been re-examined, and the chart reviewed, and there have been no interval changes to the documented history and physical.  Plan a left shoulder scope today.  Anesthesia is consulted regarding a peripheral nerve block for post-operative pain.  The risks, benefits, and alternatives have been discussed at length, and the patient is willing to proceed.    

## 2019-06-21 NOTE — Anesthesia Procedure Notes (Signed)
Procedure Name: LMA Insertion Date/Time: 06/21/2019 2:18 PM Performed by: Mayme Genta, CRNA Pre-anesthesia Checklist: Patient identified, Emergency Drugs available, Suction available, Timeout performed and Patient being monitored Patient Re-evaluated:Patient Re-evaluated prior to induction Oxygen Delivery Method: Circle system utilized Preoxygenation: Pre-oxygenation with 100% oxygen Induction Type: IV induction LMA: LMA inserted LMA Size: 4.0 Number of attempts: 1 Placement Confirmation: positive ETCO2 and breath sounds checked- equal and bilateral Tube secured with: Tape

## 2019-06-21 NOTE — Discharge Instructions (Signed)
You will need to use the sling as needed following surgery.  Start early range of motion exercises.  Keep the dressing dry.  You may remove bandage in 3 days.  You may place Band-Aids over top of the incisions.  May shower once dressing is removed in 3 days.  Remove sling carefully only for showers, leaving arm down by your side while in the shower.  +++ Make sure to take some pain medication this evening before you fall asleep, in preparation for the nerve block wearing off in the middle of the night.  If the the pain medication causes itching, or is too strong, try taking a single tablet at a time, or combining with Benadryl.  You may be most comfortable sleeping in a recliner.  If you do sleep in near bed, placed pillows behind the shoulder that have the operation to support it.   General Anesthesia, Adult, Care After This sheet gives you information about how to care for yourself after your procedure. Your health care provider may also give you more specific instructions. If you have problems or questions, contact your health care provider. What can I expect after the procedure? After the procedure, the following side effects are common:  Pain or discomfort at the IV site.  Nausea.  Vomiting.  Sore throat.  Trouble concentrating.  Feeling cold or chills.  Weak or tired.  Sleepiness and fatigue.  Soreness and body aches. These side effects can affect parts of the body that were not involved in surgery. Follow these instructions at home:  For at least 24 hours after the procedure:  Have a responsible adult stay with you. It is important to have someone help care for you until you are awake and alert.  Rest as needed.  Do not: ? Participate in activities in which you could fall or become injured. ? Drive. ? Use heavy machinery. ? Drink alcohol. ? Take sleeping pills or medicines that cause drowsiness. ? Make important decisions or sign legal documents. ? Take care  of children on your own. Eating and drinking  Follow any instructions from your health care provider about eating or drinking restrictions.  When you feel hungry, start by eating small amounts of foods that are soft and easy to digest (bland), such as toast. Gradually return to your regular diet.  Drink enough fluid to keep your urine pale yellow.  If you vomit, rehydrate by drinking water, juice, or clear broth. General instructions  If you have sleep apnea, surgery and certain medicines can increase your risk for breathing problems. Follow instructions from your health care provider about wearing your sleep device: ? Anytime you are sleeping, including during daytime naps. ? While taking prescription pain medicines, sleeping medicines, or medicines that make you drowsy.  Return to your normal activities as told by your health care provider. Ask your health care provider what activities are safe for you.  Take over-the-counter and prescription medicines only as told by your health care provider.  If you smoke, do not smoke without supervision.  Keep all follow-up visits as told by your health care provider. This is important. Contact a health care provider if:  You have nausea or vomiting that does not get better with medicine.  You cannot eat or drink without vomiting.  You have pain that does not get better with medicine.  You are unable to pass urine.  You develop a skin rash.  You have a fever.  You have redness around your IV  site that gets worse. Get help right away if:  You have difficulty breathing.  You have chest pain.  You have blood in your urine or stool, or you vomit blood. Summary  After the procedure, it is common to have a sore throat or nausea. It is also common to feel tired.  Have a responsible adult stay with you for the first 24 hours after general anesthesia. It is important to have someone help care for you until you are awake and  alert.  When you feel hungry, start by eating small amounts of foods that are soft and easy to digest (bland), such as toast. Gradually return to your regular diet.  Drink enough fluid to keep your urine pale yellow.  Return to your normal activities as told by your health care provider. Ask your health care provider what activities are safe for you. This information is not intended to replace advice given to you by your health care provider. Make sure you discuss any questions you have with your health care provider. Document Revised: 05/30/2017 Document Reviewed: 01/10/2017 Elsevier Patient Education  2020 [place] for Discharge Teaching: EXPAREL (bupivacaine liposome injectable suspension)   Your surgeon or anesthesiologist gave you EXPAREL(bupivacaine) to help control your pain after surgery.   EXPAREL is a local anesthetic that provides pain relief by numbing the tissue around the surgical site.  EXPAREL is designed to release pain medication over time and can control pain for up to 72 hours.  Depending on how you respond to EXPAREL, you may require less pain medication during your recovery.  Possible side effects:  Temporary loss of sensation or ability to move in the area where bupivacaine was injected.  Nausea, vomiting, constipation  Rarely, numbness and tingling in your mouth or lips, lightheadedness, or anxiety may occur.  Call your doctor right away if you think you may be experiencing any of these sensations, or if you have other questions regarding possible side effects.  Follow all other discharge instructions given to you by your surgeon or nurse. Eat a healthy diet and drink plenty of water or other fluids.  If you return to the hospital for any reason within 96 hours following the administration of EXPAREL, it is important for health care providers to know that you have received this anesthetic. A teal colored band has been placed on your arm  with the date, time and amount of EXPAREL you have received in order to alert and inform your health care providers. Please leave this armband in place for the full 96 hours following administration, and then you may remove the band.

## 2019-06-21 NOTE — Anesthesia Procedure Notes (Signed)
Anesthesia Regional Block: Interscalene brachial plexus block   Pre-Anesthetic Checklist: ,, timeout performed, Correct Patient, Correct Site, Correct Laterality, Correct Procedure, Correct Position, site marked, Risks and benefits discussed,  Surgical consent,  Pre-op evaluation,  At surgeon's request and post-op pain management  Laterality: Left  Prep: chloraprep       Needles:  Injection technique: Single-shot  Needle Type: Stimiplex     Needle Length: 5cm  Needle Gauge: 22     Additional Needles:   Procedures:,,,, ultrasound used (permanent image in chart),,,,  Narrative:  Injection made incrementally with aspirations every 5 mL.  Performed by: Personally   Additional Notes: Functioning IV was confirmed and monitors applied. Ultrasound guidance: relevant anatomy identified, needle position confirmed, local anesthetic spread visualized around nerve(s)., vascular puncture avoided.  Image printed for medical record.  Negative aspiration and no paresthesias; incremental administration of local anesthetic. The patient tolerated the procedure well. Vitals signes recorded in RN notes.

## 2019-06-21 NOTE — Addendum Note (Signed)
Addendum  created 06/21/19 1602 by Alisa Graff, MD   Order list changed

## 2019-06-21 NOTE — Progress Notes (Signed)
Assisted [place] ANMD with left, ultrasound guided, interscalene  block. Side rails up, monitors on throughout procedure. See vital signs in flow sheet. Tolerated Procedure well.  

## 2019-06-22 ENCOUNTER — Encounter: Payer: Self-pay | Admitting: *Deleted

## 2019-06-28 DIAGNOSIS — M25612 Stiffness of left shoulder, not elsewhere classified: Secondary | ICD-10-CM | POA: Diagnosis not present

## 2019-06-28 DIAGNOSIS — M25512 Pain in left shoulder: Secondary | ICD-10-CM | POA: Diagnosis not present

## 2019-06-30 DIAGNOSIS — M25512 Pain in left shoulder: Secondary | ICD-10-CM | POA: Diagnosis not present

## 2019-06-30 DIAGNOSIS — M25612 Stiffness of left shoulder, not elsewhere classified: Secondary | ICD-10-CM | POA: Diagnosis not present

## 2019-07-06 DIAGNOSIS — M25612 Stiffness of left shoulder, not elsewhere classified: Secondary | ICD-10-CM | POA: Diagnosis not present

## 2019-07-06 DIAGNOSIS — M25512 Pain in left shoulder: Secondary | ICD-10-CM | POA: Diagnosis not present

## 2019-07-08 DIAGNOSIS — M25612 Stiffness of left shoulder, not elsewhere classified: Secondary | ICD-10-CM | POA: Diagnosis not present

## 2019-07-08 DIAGNOSIS — M25512 Pain in left shoulder: Secondary | ICD-10-CM | POA: Diagnosis not present

## 2019-07-13 DIAGNOSIS — M25512 Pain in left shoulder: Secondary | ICD-10-CM | POA: Diagnosis not present

## 2019-07-13 DIAGNOSIS — M25612 Stiffness of left shoulder, not elsewhere classified: Secondary | ICD-10-CM | POA: Diagnosis not present

## 2019-07-15 DIAGNOSIS — M25612 Stiffness of left shoulder, not elsewhere classified: Secondary | ICD-10-CM | POA: Diagnosis not present

## 2019-07-15 DIAGNOSIS — M25512 Pain in left shoulder: Secondary | ICD-10-CM | POA: Diagnosis not present

## 2019-07-20 DIAGNOSIS — M25512 Pain in left shoulder: Secondary | ICD-10-CM | POA: Diagnosis not present

## 2019-07-20 DIAGNOSIS — M25612 Stiffness of left shoulder, not elsewhere classified: Secondary | ICD-10-CM | POA: Diagnosis not present

## 2019-07-22 DIAGNOSIS — M25612 Stiffness of left shoulder, not elsewhere classified: Secondary | ICD-10-CM | POA: Diagnosis not present

## 2019-07-22 DIAGNOSIS — M25512 Pain in left shoulder: Secondary | ICD-10-CM | POA: Diagnosis not present

## 2019-07-27 DIAGNOSIS — M25512 Pain in left shoulder: Secondary | ICD-10-CM | POA: Diagnosis not present

## 2019-07-27 DIAGNOSIS — M25612 Stiffness of left shoulder, not elsewhere classified: Secondary | ICD-10-CM | POA: Diagnosis not present

## 2019-07-27 DIAGNOSIS — Z9889 Other specified postprocedural states: Secondary | ICD-10-CM | POA: Diagnosis not present

## 2019-08-03 DIAGNOSIS — M25612 Stiffness of left shoulder, not elsewhere classified: Secondary | ICD-10-CM | POA: Diagnosis not present

## 2019-08-03 DIAGNOSIS — M25512 Pain in left shoulder: Secondary | ICD-10-CM | POA: Diagnosis not present

## 2019-08-05 DIAGNOSIS — M25512 Pain in left shoulder: Secondary | ICD-10-CM | POA: Diagnosis not present

## 2019-08-05 DIAGNOSIS — M25612 Stiffness of left shoulder, not elsewhere classified: Secondary | ICD-10-CM | POA: Diagnosis not present

## 2019-09-01 DIAGNOSIS — H2513 Age-related nuclear cataract, bilateral: Secondary | ICD-10-CM | POA: Diagnosis not present

## 2019-09-01 DIAGNOSIS — H52223 Regular astigmatism, bilateral: Secondary | ICD-10-CM | POA: Diagnosis not present

## 2019-09-01 DIAGNOSIS — H40033 Anatomical narrow angle, bilateral: Secondary | ICD-10-CM | POA: Diagnosis not present

## 2019-09-01 DIAGNOSIS — H40022 Open angle with borderline findings, high risk, left eye: Secondary | ICD-10-CM | POA: Diagnosis not present

## 2019-09-01 DIAGNOSIS — H5203 Hypermetropia, bilateral: Secondary | ICD-10-CM | POA: Diagnosis not present

## 2019-09-01 DIAGNOSIS — H524 Presbyopia: Secondary | ICD-10-CM | POA: Diagnosis not present

## 2019-09-06 DIAGNOSIS — M2142 Flat foot [pes planus] (acquired), left foot: Secondary | ICD-10-CM | POA: Diagnosis not present

## 2019-09-06 DIAGNOSIS — M21072 Valgus deformity, not elsewhere classified, left ankle: Secondary | ICD-10-CM | POA: Diagnosis not present

## 2019-09-06 DIAGNOSIS — M19072 Primary osteoarthritis, left ankle and foot: Secondary | ICD-10-CM | POA: Diagnosis not present

## 2019-09-06 DIAGNOSIS — M2141 Flat foot [pes planus] (acquired), right foot: Secondary | ICD-10-CM | POA: Diagnosis not present

## 2019-09-06 DIAGNOSIS — M21071 Valgus deformity, not elsewhere classified, right ankle: Secondary | ICD-10-CM | POA: Diagnosis not present

## 2019-09-06 DIAGNOSIS — M19071 Primary osteoarthritis, right ankle and foot: Secondary | ICD-10-CM | POA: Diagnosis not present

## 2019-10-27 DIAGNOSIS — R739 Hyperglycemia, unspecified: Secondary | ICD-10-CM | POA: Diagnosis not present

## 2019-10-27 DIAGNOSIS — Z79899 Other long term (current) drug therapy: Secondary | ICD-10-CM | POA: Diagnosis not present

## 2019-10-27 DIAGNOSIS — E7800 Pure hypercholesterolemia, unspecified: Secondary | ICD-10-CM | POA: Diagnosis not present

## 2019-11-01 DIAGNOSIS — M21071 Valgus deformity, not elsewhere classified, right ankle: Secondary | ICD-10-CM | POA: Diagnosis not present

## 2019-11-01 DIAGNOSIS — M25571 Pain in right ankle and joints of right foot: Secondary | ICD-10-CM | POA: Diagnosis not present

## 2019-11-01 DIAGNOSIS — M19072 Primary osteoarthritis, left ankle and foot: Secondary | ICD-10-CM | POA: Diagnosis not present

## 2019-11-01 DIAGNOSIS — M19071 Primary osteoarthritis, right ankle and foot: Secondary | ICD-10-CM | POA: Diagnosis not present

## 2019-11-02 DIAGNOSIS — G2581 Restless legs syndrome: Secondary | ICD-10-CM | POA: Diagnosis not present

## 2019-11-02 DIAGNOSIS — K219 Gastro-esophageal reflux disease without esophagitis: Secondary | ICD-10-CM | POA: Diagnosis not present

## 2019-11-02 DIAGNOSIS — R739 Hyperglycemia, unspecified: Secondary | ICD-10-CM | POA: Diagnosis not present

## 2019-11-02 DIAGNOSIS — E7800 Pure hypercholesterolemia, unspecified: Secondary | ICD-10-CM | POA: Diagnosis not present

## 2019-11-02 DIAGNOSIS — Z79899 Other long term (current) drug therapy: Secondary | ICD-10-CM | POA: Diagnosis not present

## 2019-11-02 DIAGNOSIS — I10 Essential (primary) hypertension: Secondary | ICD-10-CM | POA: Diagnosis not present

## 2019-11-11 ENCOUNTER — Telehealth: Payer: Self-pay | Admitting: Obstetrics and Gynecology

## 2019-11-11 NOTE — Telephone Encounter (Signed)
Patient is scheduled for fasting lab appointment before aex. Sending to triage to place orders in epic.

## 2019-11-11 NOTE — Telephone Encounter (Signed)
Pt called front office and scheduled fasting lab appt for 02/22/2020 at 845 am. Pt has scheduled AEX with Dr Quincy Simmonds on 02/28/20 at 3pm.   What fasting labs would you like for this pt?  Pt has not had labs with our office.  Last pap in 02/2018. Last blood work at [facility] lab in 03/2019.  Routing to Dr Quincy Simmonds for recommendations and advice.

## 2019-11-12 NOTE — Telephone Encounter (Signed)
Please have patient contact her PCP office for scheduling her fasting labs.  Thank you.

## 2019-11-15 NOTE — Telephone Encounter (Signed)
Spoke with pt. Pt given recommendations per Dr Quincy Simmonds on fasting labs. Pt agreeable to call and schedule with PCP and then have results sent to our office. Fasting labs appt cancelled for 02/22/20. Pt states will keep AEX scheduled for 02/28/20.  Routing to Dr Quincy Simmonds for review Encounter closed.

## 2019-11-30 DIAGNOSIS — E669 Obesity, unspecified: Secondary | ICD-10-CM | POA: Diagnosis not present

## 2019-11-30 DIAGNOSIS — K2270 Barrett's esophagus without dysplasia: Secondary | ICD-10-CM | POA: Diagnosis not present

## 2019-11-30 DIAGNOSIS — R5383 Other fatigue: Secondary | ICD-10-CM | POA: Diagnosis not present

## 2019-11-30 DIAGNOSIS — Z6834 Body mass index (BMI) 34.0-34.9, adult: Secondary | ICD-10-CM | POA: Diagnosis not present

## 2019-11-30 DIAGNOSIS — M2550 Pain in unspecified joint: Secondary | ICD-10-CM | POA: Diagnosis not present

## 2019-11-30 DIAGNOSIS — M150 Primary generalized (osteo)arthritis: Secondary | ICD-10-CM | POA: Diagnosis not present

## 2019-12-02 DIAGNOSIS — Z96643 Presence of artificial hip joint, bilateral: Secondary | ICD-10-CM | POA: Diagnosis not present

## 2019-12-02 DIAGNOSIS — Z96653 Presence of artificial knee joint, bilateral: Secondary | ICD-10-CM | POA: Diagnosis not present

## 2019-12-22 DIAGNOSIS — M7750 Other enthesopathy of unspecified foot: Secondary | ICD-10-CM | POA: Diagnosis not present

## 2019-12-22 DIAGNOSIS — D1730 Benign lipomatous neoplasm of skin and subcutaneous tissue of unspecified sites: Secondary | ICD-10-CM | POA: Diagnosis not present

## 2019-12-22 DIAGNOSIS — M7751 Other enthesopathy of right foot: Secondary | ICD-10-CM | POA: Diagnosis not present

## 2019-12-29 DIAGNOSIS — D1730 Benign lipomatous neoplasm of skin and subcutaneous tissue of unspecified sites: Secondary | ICD-10-CM | POA: Diagnosis not present

## 2019-12-29 DIAGNOSIS — M7751 Other enthesopathy of right foot: Secondary | ICD-10-CM | POA: Diagnosis not present

## 2019-12-29 DIAGNOSIS — M7750 Other enthesopathy of unspecified foot: Secondary | ICD-10-CM | POA: Diagnosis not present

## 2019-12-30 DIAGNOSIS — D1723 Benign lipomatous neoplasm of skin and subcutaneous tissue of right leg: Secondary | ICD-10-CM | POA: Diagnosis not present

## 2019-12-30 DIAGNOSIS — M65871 Other synovitis and tenosynovitis, right ankle and foot: Secondary | ICD-10-CM | POA: Diagnosis not present

## 2019-12-30 DIAGNOSIS — M7750 Other enthesopathy of unspecified foot: Secondary | ICD-10-CM | POA: Diagnosis not present

## 2019-12-30 DIAGNOSIS — D1730 Benign lipomatous neoplasm of skin and subcutaneous tissue of unspecified sites: Secondary | ICD-10-CM | POA: Diagnosis not present

## 2019-12-30 DIAGNOSIS — M24475 Recurrent dislocation, left foot: Secondary | ICD-10-CM | POA: Diagnosis not present

## 2019-12-30 DIAGNOSIS — M7751 Other enthesopathy of right foot: Secondary | ICD-10-CM | POA: Diagnosis not present

## 2020-02-18 ENCOUNTER — Telehealth: Payer: Self-pay

## 2020-02-18 NOTE — Telephone Encounter (Signed)
Spoke with patient.  Patient will see Dr. Quincy Simmonds for AEX 02/28/20 and PCP for med check up in 03/2020. Patient will have fasting labs with PCP and request copy of labs to Dr. Quincy Simmonds. Questions answered.   Routing to Dr. Antony Blackbird.   Encounter closed.

## 2020-02-18 NOTE — Telephone Encounter (Signed)
Patient is calling in regards to lab visit that was cancelled.

## 2020-02-22 ENCOUNTER — Other Ambulatory Visit: Payer: PPO

## 2020-02-28 ENCOUNTER — Other Ambulatory Visit: Payer: Self-pay

## 2020-02-28 ENCOUNTER — Ambulatory Visit (INDEPENDENT_AMBULATORY_CARE_PROVIDER_SITE_OTHER): Payer: PPO | Admitting: Obstetrics and Gynecology

## 2020-02-28 ENCOUNTER — Encounter: Payer: Self-pay | Admitting: Obstetrics and Gynecology

## 2020-02-28 VITALS — BP 130/76 | HR 70 | Resp 14 | Ht 62.5 in | Wt 191.6 lb

## 2020-02-28 DIAGNOSIS — Z01419 Encounter for gynecological examination (general) (routine) without abnormal findings: Secondary | ICD-10-CM

## 2020-02-28 NOTE — Progress Notes (Signed)
74 y.o. G41P2002 Married Caucasian female here for annual exam.    No vaginal bleeding.  No bladder control issues or problems with bowel function.   Torn rotator cuff.  [place] and reinjured the area.  Seeing a specialist in [place] for her feet.   Received her Covid vaccine, completed in March, 2021.   Mother is in assisted living, [facility].   PCP:   Whitney Post, MD  Patient's last menstrual period was 02/08/2005 (approximate).           Sexually active: Yes.    Husband had prostate surgery. The current method of family planning is vasectomy.    Exercising: Yes.    water aerobics 3x/week Smoker:  no  Health Maintenance: Pap:  03/04/18 - pap normal.  History of abnormal Pap:  no MMG: 04-26-19 3D/Neg/density A/BiRads1.  Has appt scheduled.  Colonoscopy: 04-13-18 normal;next 10 years BMD: 11-13-11  Result : t-score -0.05 normal TDaP: 12-28-13 Gardasil:   no HIV:no Hep C: 04-05-16 Screening Labs:  PCP.   reports that she has never smoked. She has never used smokeless tobacco. She reports that she does not drink alcohol and does not use drugs.  Past Medical History:  Diagnosis Date  . Arthritis   . 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 GERD (gastroesophageal reflux disease) 8/01    Dr. Tiffany Kocher  . Hypertension   . Low back pain 11/95  . Menopause   . Osteoarthritis   . Restless leg syndrome 1999    Past Surgical History:  Procedure Laterality Date  . [place]   X2  . COLONOSCOPY  12/06/11   normal  . COLONOSCOPY WITH PROPOFOL N/A 04/13/2018   Procedure: COLONOSCOPY WITH PROPOFOL;  Surgeon: Manya Silvas, MD;  Location: [facility] ENDOSCOPY;  Service: Endoscopy;  Laterality: N/A;  . ESOPHAGOGASTRODUODENOSCOPY (EGD) WITH PROPOFOL N/A 02/23/2015   Procedure: ESOPHAGOGASTRODUODENOSCOPY (EGD) WITH PROPOFOL;  Surgeon: Manya Silvas, MD;  Location: [facility] ENDOSCOPY;  Service: Endoscopy;  Laterality: N/A;  .  ESOPHAGOGASTRODUODENOSCOPY (EGD) WITH PROPOFOL N/A 04/13/2018   Procedure: ESOPHAGOGASTRODUODENOSCOPY (EGD) WITH PROPOFOL;  Surgeon: Manya Silvas, MD;  Location: [facility] ENDOSCOPY;  Service: Endoscopy;  Laterality: N/A;  . HIP CLOSED REDUCTION Left 08/08/2015   Procedure: CLOSED REDUCTION HIP;  Surgeon: Dereck Leep, MD;  Location: [facility] ORS;  Service: Orthopedics;  Laterality: Left;  . HIP CLOSED REDUCTION Left 09/18/2015   Procedure: CLOSED REDUCTION HIP;  Surgeon: Hessie Knows, MD;  Location: [facility] ORS;  Service: Orthopedics;  Laterality: Left;  . HIP CLOSED REDUCTION Left 12/01/2015   Procedure: CLOSED REDUCTION HIP;  Surgeon: Dereck Leep, MD;  Location: [facility] ORS;  Service: Orthopedics;  Laterality: Left;  no prep no incision  . JOINT REPLACEMENT  10/2016   left knee  . KNEE ARTHROPLASTY Left 10/28/2016   Procedure: COMPUTER ASSISTED TOTAL KNEE ARTHROPLASTY;  Surgeon: Dereck Leep, MD;  Location: [facility] ORS;  Service: Orthopedics;  Laterality: Left;  . KNEE ARTHROPLASTY Right 07/07/2017   Procedure: COMPUTER ASSISTED TOTAL KNEE ARTHROPLASTY;  Surgeon: Dereck Leep, MD;  Location: [facility] ORS;  Service: Orthopedics;  Laterality: Right;  . KNEE ARTHROSCOPY Right 11/14/2014   Procedure: ARTHROSCOPY KNEE;  Surgeon: Dereck Leep, MD;  Location: [facility] ORS;  Service: Orthopedics;  Laterality: Right;  Posterior horn menicus, anterior lateral menicus grade 3 chondromalacia  . phlebitis groin    . right foot surgery    .  SHOULDER ARTHROSCOPY WITH OPEN ROTATOR CUFF REPAIR Left 03/10/2019   Procedure: LEFT SHOULDER ARTHROSCOPY, ARTHROSCOPIC ROTATOR CUFF REPAIR, DISTAL CLAVICLE EXCISION, SUBACROMIAL DECOMPRESSION, INTRA-ARTICULAR DEBRIDEMENT ;  Surgeon: Lovell Sheehan, MD;  Location: [facility] ORS;  Service: Orthopedics;  Laterality: Left;  . SHOULDER ARTHROSCOPY WITH ROTATOR CUFF REPAIR Left 06/21/2019   Procedure: SHOULDER ARTHROSCOPY WITH EXTENSIVE INTRAARTICULAR DEBRIDEMENT WITH REMOVAL OF HARDWARE;   Surgeon: Lovell Sheehan, MD;  Location: [facility];  Service: Orthopedics;  Laterality: Left;  . TONSILLECTOMY    . TOTAL HIP ARTHROPLASTY Left 05/08/11   necrosis of hip  . TOTAL HIP ARTHROPLASTY Right 06/14/2015   Procedure: TOTAL HIP ARTHROPLASTY;  Surgeon: Dereck Leep, MD;  Location: [facility] ORS;  Service: Orthopedics;  Laterality: Right;  . TOTAL HIP REVISION Left 02/21/2016   Procedure: TOTAL HIP REVISION;  Surgeon: Dereck Leep, MD;  Location: [facility] ORS;  Service: Orthopedics;  Laterality: Left;    Current Outpatient Medications  Medication Sig Dispense Refill  . atenolol (TENORMIN) 50 MG tablet TAKE 1 TABLET BY MOUTH TWICE A DAY    . Biotin 5000 MCG TABS Take 10,000 mcg by mouth daily.     . calcium carbonate (OS-CAL - DOSED IN MG OF ELEMENTAL CALCIUM) 1250 (500 Ca) MG tablet Calcium 500  500 mg calcium (1,250 mg) tablet    . Calcium-Phosphorus-Vitamin D (CITRACAL +D3 PO) Take 1 tablet by mouth 2 (two) times daily.     . Coenzyme Q10 (CO Q-10) 200 MG CAPS Take 200 mg by mouth daily.    . diclofenac Sodium (VOLTAREN) 1 % GEL diclofenac 1 % topical gel    . docusate sodium (COLACE) 100 MG capsule Take 1 capsule (100 mg total) by mouth daily as needed. 30 capsule 2  . Melatonin 10 MG TABS Take 10 mg by mouth at bedtime.    . Menthol, Topical Analgesic, (BIOFREEZE EX) Apply 1 application topically daily as needed (foot pain).    Marland Kitchen omeprazole (PRILOSEC) 20 MG capsule Take by mouth.    . pramipexole (MIRAPEX) 1.5 MG tablet Take 1.5 mg by mouth at bedtime.     . Red Yeast Rice 600 MG TABS Take 600 mg by mouth 2 (two) times daily.     . traZODone (DESYREL) 50 MG tablet trazodone 50 mg tablet     No current facility-administered medications for this visit.    Family History  Problem Relation Age of Onset  . Diabetes Mother   . Osteoarthritis Mother   . Diabetes Maternal Grandfather   . Hypertension Maternal Grandfather   . Ovarian cancer Paternal Aunt   . Cancer - Lung  Paternal Uncle     Review of Systems  All other systems reviewed and are negative.   Exam:   BP 130/76 (Cuff Size: Large)   Pulse 70   Resp 14   Ht 5' 2.5" (1.588 m)   Wt 191 lb 9.6 oz (86.9 kg)   LMP 02/08/2005 (Approximate)   BMI 34.49 kg/m     General appearance: alert, cooperative and appears stated age Head: normocephalic, without obvious abnormality, atraumatic Neck: no adenopathy, supple, symmetrical, trachea midline and thyroid normal to inspection and palpation Lungs: clear to auscultation bilaterally Breasts: normal appearance, no masses or tenderness, No nipple retraction or dimpling, No nipple discharge or bleeding, No axillary adenopathy Heart: regular rate and rhythm Abdomen: soft, non-tender; no masses, no organomegaly Extremities: extremities normal, atraumatic, no cyanosis or edema Skin: skin color, texture, turgor normal. No rashes or  lesions Lymph nodes: cervical, supraclavicular, and axillary nodes normal. Neurologic: grossly normal  Pelvic: External genitalia:  no lesions              No abnormal inguinal nodes palpated.              Urethra:  normal appearing urethra with no masses, tenderness or lesions              Bartholins and Skenes: normal                 Vagina: normal appearing vagina with normal color and discharge, no lesions              Cervix: no lesions              Pap taken: No. Bimanual Exam:  Uterus:  normal size, contour, position, consistency, mobility, non-tender              Adnexa: no mass, fullness, tenderness              Rectal exam: Yes.  .  Confirms.              Anus:  normal sphincter tone, no lesions  Chaperone was present for exam.  Assessment:   Well woman visit with normal exam. Status post orthopedic surgeries.   Plan: Mammogram screening discussed. Self breast awareness reviewed. Pap and HR HPV as above. Guidelines for Calcium, Vitamin D, regular exercise program including cardiovascular and weight bearing  exercise.   Follow up annually and prn.   After visit summary provided.

## 2020-02-28 NOTE — Patient Instructions (Signed)

## 2020-03-02 DIAGNOSIS — H2513 Age-related nuclear cataract, bilateral: Secondary | ICD-10-CM | POA: Diagnosis not present

## 2020-03-02 DIAGNOSIS — H52223 Regular astigmatism, bilateral: Secondary | ICD-10-CM | POA: Diagnosis not present

## 2020-03-02 DIAGNOSIS — H40033 Anatomical narrow angle, bilateral: Secondary | ICD-10-CM | POA: Diagnosis not present

## 2020-03-02 DIAGNOSIS — H5203 Hypermetropia, bilateral: Secondary | ICD-10-CM | POA: Diagnosis not present

## 2020-03-02 DIAGNOSIS — H40022 Open angle with borderline findings, high risk, left eye: Secondary | ICD-10-CM | POA: Diagnosis not present

## 2020-03-02 DIAGNOSIS — H524 Presbyopia: Secondary | ICD-10-CM | POA: Diagnosis not present

## 2020-03-17 DIAGNOSIS — Z23 Encounter for immunization: Secondary | ICD-10-CM | POA: Diagnosis not present

## 2020-03-17 DIAGNOSIS — I872 Venous insufficiency (chronic) (peripheral): Secondary | ICD-10-CM | POA: Diagnosis not present

## 2020-03-17 DIAGNOSIS — M79671 Pain in right foot: Secondary | ICD-10-CM | POA: Diagnosis not present

## 2020-03-22 DIAGNOSIS — M24475 Recurrent dislocation, left foot: Secondary | ICD-10-CM | POA: Diagnosis not present

## 2020-03-22 DIAGNOSIS — M7751 Other enthesopathy of right foot: Secondary | ICD-10-CM | POA: Diagnosis not present

## 2020-03-22 DIAGNOSIS — D1730 Benign lipomatous neoplasm of skin and subcutaneous tissue of unspecified sites: Secondary | ICD-10-CM | POA: Diagnosis not present

## 2020-03-23 DIAGNOSIS — M25372 Other instability, left ankle: Secondary | ICD-10-CM | POA: Diagnosis not present

## 2020-03-23 DIAGNOSIS — M24475 Recurrent dislocation, left foot: Secondary | ICD-10-CM | POA: Diagnosis not present

## 2020-03-23 DIAGNOSIS — D1723 Benign lipomatous neoplasm of skin and subcutaneous tissue of right leg: Secondary | ICD-10-CM | POA: Diagnosis not present

## 2020-03-23 DIAGNOSIS — M7750 Other enthesopathy of unspecified foot: Secondary | ICD-10-CM | POA: Diagnosis not present

## 2020-04-02 DIAGNOSIS — M24475 Recurrent dislocation, left foot: Secondary | ICD-10-CM | POA: Diagnosis not present

## 2020-04-24 DIAGNOSIS — Z79899 Other long term (current) drug therapy: Secondary | ICD-10-CM | POA: Diagnosis not present

## 2020-04-24 DIAGNOSIS — E7800 Pure hypercholesterolemia, unspecified: Secondary | ICD-10-CM | POA: Diagnosis not present

## 2020-04-24 DIAGNOSIS — R739 Hyperglycemia, unspecified: Secondary | ICD-10-CM | POA: Diagnosis not present

## 2020-05-11 ENCOUNTER — Encounter: Payer: Self-pay | Admitting: Obstetrics and Gynecology

## 2020-05-11 ENCOUNTER — Ambulatory Visit: Payer: PPO | Admitting: Dermatology

## 2020-05-11 ENCOUNTER — Other Ambulatory Visit: Payer: Self-pay

## 2020-05-11 DIAGNOSIS — D2239 Melanocytic nevi of other parts of face: Secondary | ICD-10-CM | POA: Diagnosis not present

## 2020-05-11 DIAGNOSIS — D485 Neoplasm of uncertain behavior of skin: Secondary | ICD-10-CM | POA: Diagnosis not present

## 2020-05-11 DIAGNOSIS — L720 Epidermal cyst: Secondary | ICD-10-CM

## 2020-05-11 DIAGNOSIS — L578 Other skin changes due to chronic exposure to nonionizing radiation: Secondary | ICD-10-CM | POA: Diagnosis not present

## 2020-05-11 DIAGNOSIS — L821 Other seborrheic keratosis: Secondary | ICD-10-CM | POA: Diagnosis not present

## 2020-05-11 DIAGNOSIS — Z1231 Encounter for screening mammogram for malignant neoplasm of breast: Secondary | ICD-10-CM | POA: Diagnosis not present

## 2020-05-11 DIAGNOSIS — D229 Melanocytic nevi, unspecified: Secondary | ICD-10-CM

## 2020-05-11 NOTE — Patient Instructions (Signed)

## 2020-05-11 NOTE — Progress Notes (Signed)
   Follow-Up Visit   Subjective  Claudia Terry is a 74 y.o. female who presents for the following: Spot Check (Pt has a spot on the scalp that she would like checked.).  She also notes some spots on her face she would like checked.   The following portions of the chart were reviewed this encounter and updated as appropriate:  Tobacco  Allergies  Meds  Problems  Med Hx  Surg Hx  Fam Hx      Review of Systems: No other skin or systemic complaints except as noted in HPI or Assessment and Plan.   Objective  Well appearing patient in no apparent distress; mood and affect are within normal limits.  A focused examination was performed including face and scalp. Relevant physical exam findings are noted in the Assessment and Plan.  Objective  Head - Anterior (Face): Tan-brown and/or pink-flesh-colored symmetric macules and papules.   Objective  Head - Anterior (Face): Stuck-on, waxy, tan-brown papules and plaques -- Discussed benign etiology and prognosis.   Objective  Left nasal dorsum: 0.1 cm skin colored papule with background telangiectatic macule     Objective  left occipital scalp: Smooth white papule(s).   Assessment & Plan  Nevus Head - Anterior (Face)  Benign-appearing.  Observation.  Call clinic for new or changing moles.  Recommend daily use of broad spectrum spf 30+ sunscreen to sun-exposed areas.    Seborrheic keratosis Head - Anterior (Face)  Benign-appearing.  Observation.  Call clinic for new or changing lesions.  Recommend daily use of broad spectrum spf 30+ sunscreen to sun-exposed areas.    Neoplasm of uncertain behavior of skin Left nasal dorsum  Skin / nail biopsy Type of biopsy: tangential   Informed consent: discussed and consent obtained   Timeout: patient name, date of birth, surgical site, and procedure verified   Procedure prep:  Patient was prepped and draped in usual sterile fashion Prep type:  Isopropyl alcohol Anesthesia: the  lesion was anesthetized in a standard fashion   Anesthetic:  1% lidocaine w/ epinephrine 1-100,000 buffered w/ 8.4% NaHCO3 Instrument used: flexible razor blade   Hemostasis achieved with: pressure, aluminum chloride and electrodesiccation   Outcome: patient tolerated procedure well   Post-procedure details: sterile dressing applied and wound care instructions given   Dressing type: bandage and petrolatum    Specimen 1 - Surgical pathology Differential Diagnosis: R/o BCC Check Margins: Yes 0.1 cm skin colored papule with background telangiectatic macule  [place] left occipital scalp  Benign-appearing.  Observation.  Call clinic for new or changing lesions.  Recommend daily use of broad spectrum spf 30+ sunscreen to sun-exposed areas.    Actinic Damage - chronic, secondary to cumulative UV radiation exposure/sun exposure over time - diffuse scaly erythematous macules with underlying dyspigmentation - Recommend daily broad spectrum sunscreen SPF 30+ to sun-exposed areas, reapply every 2 hours as needed.  - Call for new or changing lesions.  Return if symptoms worsen or fail to improve.   I, Harriett Sine, CMA, am acting as scribe for Forest Gleason, MD.  Documentation: I have reviewed the above documentation for accuracy and completeness, and I agree with the above.  Forest Gleason, MD

## 2020-05-19 DIAGNOSIS — S93314A Dislocation of tarsal joint of right foot, initial encounter: Secondary | ICD-10-CM | POA: Diagnosis not present

## 2020-05-19 DIAGNOSIS — M7750 Other enthesopathy of unspecified foot: Secondary | ICD-10-CM | POA: Diagnosis not present

## 2020-05-19 DIAGNOSIS — D1723 Benign lipomatous neoplasm of skin and subcutaneous tissue of right leg: Secondary | ICD-10-CM | POA: Diagnosis not present

## 2020-05-19 DIAGNOSIS — M24274 Disorder of ligament, right foot: Secondary | ICD-10-CM | POA: Diagnosis not present

## 2020-05-19 DIAGNOSIS — M24475 Recurrent dislocation, left foot: Secondary | ICD-10-CM | POA: Diagnosis not present

## 2020-06-03 ENCOUNTER — Emergency Department: Payer: PPO

## 2020-06-03 ENCOUNTER — Emergency Department
Admission: EM | Admit: 2020-06-03 | Discharge: 2020-06-03 | Disposition: A | Discharge status: Home / Self Care | Payer: PPO | Attending: Emergency Medicine | Admitting: Emergency Medicine

## 2020-06-03 ENCOUNTER — Other Ambulatory Visit: Payer: Self-pay

## 2020-06-03 DIAGNOSIS — M79671 Pain in right foot: Secondary | ICD-10-CM | POA: Diagnosis not present

## 2020-06-03 DIAGNOSIS — I10 Essential (primary) hypertension: Secondary | ICD-10-CM | POA: Diagnosis not present

## 2020-06-03 DIAGNOSIS — M19071 Primary osteoarthritis, right ankle and foot: Secondary | ICD-10-CM | POA: Diagnosis not present

## 2020-06-03 DIAGNOSIS — R030 Elevated blood-pressure reading, without diagnosis of hypertension: Secondary | ICD-10-CM | POA: Diagnosis present

## 2020-06-03 DIAGNOSIS — R2241 Localized swelling, mass and lump, right lower limb: Secondary | ICD-10-CM | POA: Diagnosis not present

## 2020-06-03 DIAGNOSIS — Z5321 Procedure and treatment not carried out due to patient leaving prior to being seen by health care provider: Secondary | ICD-10-CM | POA: Diagnosis not present

## 2020-06-03 DIAGNOSIS — Z79899 Other long term (current) drug therapy: Secondary | ICD-10-CM | POA: Diagnosis not present

## 2020-06-03 DIAGNOSIS — M19072 Primary osteoarthritis, left ankle and foot: Secondary | ICD-10-CM | POA: Diagnosis not present

## 2020-06-03 DIAGNOSIS — M7989 Other specified soft tissue disorders: Secondary | ICD-10-CM

## 2020-06-03 MED ORDER — OXYCODONE-ACETAMINOPHEN 5-325 MG PO TABS: 1.0000 | ORAL_TABLET | ORAL | 0 refills | Status: DC | PRN

## 2020-06-03 MED ORDER — OXYCODONE-ACETAMINOPHEN 5-325 MG PO TABS
1.0000 | ORAL_TABLET | Freq: Once | ORAL | Status: AC
  Administered 2020-06-03: 1 via ORAL
  Filled 2020-06-03: qty 1

## 2020-06-03 NOTE — Discharge Instructions (Addendum)
You may take Percocet as needed for pain.  Elevate right foot as often as possible.  Return to the ER for worsening symptoms, fever, persistent vomiting or other concerns

## 2020-06-03 NOTE — ED Provider Notes (Signed)
[facility] Emergency Department Provider Note   ____________________________________________   Event Date/Time   First MD Initiated Contact with Patient 06/03/20 [phone]     (approximate)  I have reviewed the triage vital signs and the nursing notes.   HISTORY  Chief Complaint Foot Pain    HPI Claudia Terry is a 74 y.o. female who presents to the ED from home with a chief complaint of postoperative right foot pain.  Patient had right foot surgery with screw placement in [place] on 05/19/2020.  Has not yet placed weight on her right foot.  Presents with increased swelling and pain to that foot.  Took one of her husbands Lasix yesterday with improvement in swelling.  Denies fever/chills, nausea/vomiting.  Ran out of her Percocet for pain.  Desires to have an x-ray to evaluate screw placement.     Past Medical History:  Diagnosis Date  . Arthritis   . 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 GERD (gastroesophageal reflux disease) 8/01    Dr. Tiffany Kocher  . Hypertension   . Low back pain 11/95  . Menopause   . Osteoarthritis   . Restless leg syndrome 1999    Patient Active Problem List   Diagnosis Date Noted  . Generalized osteoarthritis of multiple sites 07/28/2018  . Varicose veins of both legs with edema 07/28/2018  . FH: colon polyps 02/10/2018  . S/P total knee arthroplasty 07/07/2017  . Status post total left knee replacement 10/28/2016  . Failed total hip arthroplasty with dislocation ([place]) 12/01/2015  . S/P closed reduction of dislocated total hip prosthesis 08/08/2015  . S/P total hip arthroplasty 06/14/2015  . Benign essential HTN 12/28/2013  . Acid reflux 12/28/2013  . HLD (hyperlipidemia) 12/28/2013  . Restless leg 12/28/2013    Past Surgical History:  Procedure Laterality Date  . [place]   X2  . COLONOSCOPY  12/06/11   normal  . COLONOSCOPY WITH PROPOFOL N/A 04/13/2018    Procedure: COLONOSCOPY WITH PROPOFOL;  Surgeon: Manya Silvas, MD;  Location[facility] ENDOSCOPY;  Service: Endoscopy;  Laterality: N/A;  . ESOPHAGOGASTRODUODENOSCOPY (EGD) WITH PROPOFOL N/A 02/23/2015   Procedure: ESOPHAGOGASTRODUODENOSCOPY (EGD) WITH PROPOFOL;  Surgeon: Manya Silvas, MD;  Location: [facility] ENDOSCOPY;  Service: Endoscopy;  Laterality: N/A;  . ESOPHAGOGASTRODUODENOSCOPY (EGD) WITH PROPOFOL N/A 04/13/2018   Procedure: ESOPHAGOGASTRODUODENOSCOPY (EGD) WITH PROPOFOL;  Surgeon: Manya Silvas, MD;  Location: [facility] ENDOSCOPY;  Service: Endoscopy;  Laterality: N/A;  . HIP CLOSED REDUCTION Left 08/08/2015   Procedure: CLOSED REDUCTION HIP;  Surgeon: Dereck Leep, MD;  Location: [facility] ORS;  Service: Orthopedics;  Laterality: Left;  . HIP CLOSED REDUCTION Left 09/18/2015   Procedure: CLOSED REDUCTION HIP;  Surgeon: Hessie Knows, MD;  Location: [facility] ORS;  Service: Orthopedics;  Laterality: Left;  . HIP CLOSED REDUCTION Left 12/01/2015   Procedure: CLOSED REDUCTION HIP;  Surgeon: Dereck Leep, MD;  Location: [facility] ORS;  Service: Orthopedics;  Laterality: Left;  no prep no incision  . JOINT REPLACEMENT  10/2016   left knee  . KNEE ARTHROPLASTY Left 10/28/2016   Procedure: COMPUTER ASSISTED TOTAL KNEE ARTHROPLASTY;  Surgeon: Dereck Leep, MD;  Location: [facility] ORS;  Service: Orthopedics;  Laterality: Left;  . KNEE ARTHROPLASTY Right 07/07/2017   Procedure: COMPUTER ASSISTED TOTAL KNEE ARTHROPLASTY;  Surgeon: Dereck Leep, MD;  Location: [facility] ORS;  Service: Orthopedics;  Laterality: Right;  . KNEE ARTHROSCOPY  Right 11/14/2014   Procedure: ARTHROSCOPY KNEE;  Surgeon: Donato HeinzJames P Hooten, MD;  Location: [facility] ORS;  Service: Orthopedics;  Laterality: Right;  Posterior horn menicus, anterior lateral menicus grade 3 chondromalacia  . phlebitis groin    . right foot surgery    . SHOULDER ARTHROSCOPY WITH OPEN ROTATOR CUFF REPAIR Left 03/10/2019   Procedure: LEFT SHOULDER ARTHROSCOPY, ARTHROSCOPIC ROTATOR  CUFF REPAIR, DISTAL CLAVICLE EXCISION, SUBACROMIAL DECOMPRESSION, INTRA-ARTICULAR DEBRIDEMENT ;  Surgeon: Lyndle HerrlichBowers, James R, MD;  Location: [facility] ORS;  Service: Orthopedics;  Laterality: Left;  . SHOULDER ARTHROSCOPY WITH ROTATOR CUFF REPAIR Left 06/21/2019   Procedure: SHOULDER ARTHROSCOPY WITH EXTENSIVE INTRAARTICULAR DEBRIDEMENT WITH REMOVAL OF HARDWARE;  Surgeon: Lyndle HerrlichBowers, James R, MD;  [facility];  Service: Orthopedics;  Laterality: Left;  . TONSILLECTOMY    . TOTAL HIP ARTHROPLASTY Left 05/08/11   necrosis of hip  . TOTAL HIP ARTHROPLASTY Right 06/14/2015   Procedure: TOTAL HIP ARTHROPLASTY;  Surgeon: Donato HeinzJames P Hooten, MD;  Location: [facility] ORS;  Service: Orthopedics;  Laterality: Right;  . TOTAL HIP REVISION Left 02/21/2016   Procedure: TOTAL HIP REVISION;  Surgeon: Donato HeinzJames P Hooten, MD;  Location: [facility] ORS;  Service: Orthopedics;  Laterality: Left;    Prior to Admission medications   Medication Sig Start Date End Date Taking? Authorizing Provider  atenolol (TENORMIN) 50 MG tablet TAKE 1 TABLET BY MOUTH TWICE A DAY 01/12/19   [provider]  Biotin 5000 MCG TABS Take 10,000 mcg by mouth daily.     [provider]  calcium carbonate (OS-CAL - DOSED IN MG OF ELEMENTAL CALCIUM) 1250 (500 Ca) MG tablet Calcium 500  500 mg calcium (1,250 mg) tablet    [provider]  Calcium-Phosphorus-Vitamin D (CITRACAL +D3 PO) Take 1 tablet by mouth 2 (two) times daily.     [provider]  Coenzyme Q10 (CO Q-10) 200 MG CAPS Take 200 mg by mouth daily.    [provider]  diclofenac Sodium (VOLTAREN) 1 % GEL diclofenac 1 % topical gel    [provider]  Melatonin 10 MG TABS Take 10 mg by mouth at bedtime.    [provider]  Menthol, Topical Analgesic, (BIOFREEZE EX) Apply 1 application topically daily as needed (foot pain).    [provider]  omeprazole (PRILOSEC) 20 MG capsule Take by mouth. 11/10/18   [provider]   oxyCODONE-acetaminophen (PERCOCET/ROXICET) 5-325 MG tablet Take 1 tablet by mouth every 4 (four) hours as needed for severe pain. 06/03/20   Irean HongSung, Raiden Haydu J, MD  pramipexole (MIRAPEX) 1.5 MG tablet Take 1.5 mg by mouth at bedtime.     [provider]  Red Yeast Rice 600 MG TABS Take 600 mg by mouth 2 (two) times daily.     [provider]  traZODone (DESYREL) 50 MG tablet trazodone 50 mg tablet 11/02/19   [provider]    Allergies Penicillins, Erythromycin, Other, and Tetracyclines & related  Family History  Problem Relation Age of Onset  . Diabetes Mother   . Osteoarthritis Mother   . Diabetes Maternal Grandfather   . Hypertension Maternal Grandfather   . Ovarian cancer Paternal Aunt   . Cancer - Lung Paternal Uncle     Social History Social History   Tobacco Use  . Smoking status: Never Smoker  . Smokeless tobacco: Never Used  Vaping Use  . Vaping Use: Never used  Substance Use Topics  . Alcohol use: No  . Drug use: No  Review of Systems  Constitutional: No fever/chills Eyes: No visual changes. ENT: No sore throat. Cardiovascular: Denies chest pain. Respiratory: Denies shortness of breath. Gastrointestinal: No abdominal pain.  No nausea, no vomiting.  No diarrhea.  No constipation. Genitourinary: Negative for dysuria. Musculoskeletal: Positive for right foot pain.  Negative for back pain. Skin: Negative for rash. Neurological: Negative for headaches, focal weakness or numbness.   ____________________________________________   PHYSICAL EXAM:  VITAL SIGNS: ED Triage Vitals  Enc Vitals Group     BP 06/03/20 0318 140/63     Pulse Rate 06/03/20 0317 88     Resp 06/03/20 0317 14     Temp 06/03/20 0317 98.3 F (36.8 C)     Temp Source 06/03/20 0317 Oral     SpO2 06/03/20 0317 97 %     Weight 06/03/20 0317 185 lb (83.9 kg)     Height 06/03/20 0317 5\' 4"  (1.626 m)     Head Circumference --      Peak Flow --      Pain Score  06/03/20 0318 10     Pain Loc --      Pain Edu? --      Excl. in [place]? --     Constitutional: Alert and oriented. Well appearing and in mild acute distress. Eyes: Conjunctivae are normal. PERRL. EOMI. Head: Atraumatic. Nose: No congestion/rhinnorhea. Mouth/Throat: Mucous membranes are moist.  Oropharynx non-erythematous. Neck: No stridor.   Cardiovascular: Normal rate, regular rhythm. Grossly normal heart sounds.  Good peripheral circulation. Respiratory: Normal respiratory effort.  No retractions. Lungs CTAB. Gastrointestinal: Soft and nontender. No distention. No abdominal bruits. No CVA tenderness. Musculoskeletal:  Right foot: Operative site with sutures in place clean/dry/intact without warmth/erythema.  Mild diffuse swelling to entire foot.  Tender to palpation at postoperative site.  2+ distal pulses.  Brisk, less than 5-second capillary refill. Neurologic:  Normal speech and language. No gross focal neurologic deficits are appreciated.  Skin:  Skin is warm, dry and intact. No rash noted. Psychiatric: Mood and affect are normal. Speech and behavior are normal.  ____________________________________________   LABS (all labs ordered are listed, but only abnormal results are displayed)  Labs Reviewed - No data to display ____________________________________________  EKG  None ____________________________________________  RADIOLOGY I, Velton Roselle J, personally viewed and evaluated these images (plain radiographs) as part of my medical decision making, as well as reviewing the written report by the radiologist.  ED MD interpretation: No osseous abnormality of either foot, no hardware complication, mild diffuse right foot soft tissue swelling  Official radiology report(s): DG Foot Complete Left  Result Date: 06/03/2020 CLINICAL DATA:  Initial evaluation for acute swelling, recent foot surgery. EXAM: LEFT FOOT - COMPLETE 3+ VIEW COMPARISON:  Prior radiograph from 04/20/2019.  FINDINGS: Single fixation screw in place at the sinus tarsi/subtalar articulation. No visible hardware complication. Scattered osteoarthritic changes present throughout the mid and forefoot. No acute fracture or dislocation. Osteopenia. No visible soft tissue abnormality. IMPRESSION: 1. No acute osseous abnormality about the left foot. 2. Sequelae of prior ORIF at the sinus tarsi/subtalar articulation. No visible hardware complication. Electronically Signed   By: Jeannine Boga M.D.   On: 06/03/2020 05:09   DG Foot Complete Right  Result Date: 06/03/2020 CLINICAL DATA:  Initial evaluation for increased swelling and pain to right foot, recent surgery. EXAM: RIGHT FOOT COMPLETE - 3+ VIEW COMPARISON:  Prior radiograph from 04/20/2019. FINDINGS: Single metallic screw overlies the sinus subtalar articulation. No visible hardware complication. Irregularity at the  navicular bone is chronic in appearance. Sclerotic band at the proximal third metatarsal could reflect a remotely healed fracture. No acute fracture dislocation. Scattered osteoarthritic changes seen throughout the mid and forefoot. Plantar calcaneal enthesophyte. Mild diffuse soft tissue swelling about the foot. IMPRESSION: 1. Mild diffuse soft tissue swelling about the foot. No acute osseous abnormality. 2. Sequelae of prior ORIF at the sinus tarsi/subtalar articulation. No hardware complication. 3. No other acute osseous abnormality about the foot. Electronically Signed   By: Jeannine Boga M.D.   On: 06/03/2020 05:01    ____________________________________________   PROCEDURES  Procedure(s) performed (including Critical Care):  Procedures   ____________________________________________   INITIAL IMPRESSION / ASSESSMENT AND PLAN / ED COURSE  As part of my medical decision making, I reviewed the following data within the [place] History obtained from family, Nursing notes reviewed and incorporated, Old chart  reviewed, Radiograph reviewed, Notes from prior ED visits and [place] Controlled Substance Database     74 year old female presenting with right foot pain and swelling status post right foot surgery with screw placement on 05/19/2020.  Differential diagnosis includes but is not limited to peripheral edema, venous insufficiency, cellulitis, malposition hardware, fracture, etc.  We will obtain right foot x-ray.  Patient had similar surgery on the left and requesting comparison films.  Administer Percocet for pain.  Will reassess.  Clinical Course as of 06/03/20 [id number]  Sat Jun 03, 2020  [id number] Updated patient and spouse on x-ray results.  Will discharge home with prescription for Percocet and patient will follow up with her foot surgeon as needed.  She will continue nonweightbearing and start elevating her foot more often.  Strict return precautions given.  Both verbalized understanding and agree with plan of care. [JS]    Clinical Course User Index [JS] Paulette Blanch, MD     ____________________________________________   FINAL CLINICAL IMPRESSION(S) / ED DIAGNOSES  Final diagnoses:  Foot pain, right  Swelling of right foot     ED Discharge Orders         Ordered    oxyCODONE-acetaminophen (PERCOCET/ROXICET) 5-325 MG tablet  Every 4 hours PRN        06/03/20 0527          *Please note:  Claudia Terry was evaluated in Emergency Department on 06/03/2020 for the symptoms described in the history of present illness. She was evaluated in the context of the global COVID-19 pandemic, which necessitated consideration that the patient might be at risk for infection with the SARS-CoV-2 virus that causes COVID-19. Institutional protocols and algorithms that pertain to the evaluation of patients at risk for COVID-19 are in a state of rapid change based on information released by regulatory bodies including the CDC and federal and state organizations. These policies and algorithms were followed during  the patient's care in the ED.  Some ED evaluations and interventions may be delayed as a result of limited staffing during and the pandemic.*   Note:  This document was prepared using Dragon voice recognition software and may include unintentional dictation errors.   Paulette Blanch, MD 06/03/20 [phone]

## 2020-06-03 NOTE — ED Triage Notes (Signed)
Pt states had right foot surgery with screw placement on 05/19/20. Pt states she is now having increased swelling and pain to right foot. Denies fever.

## 2020-06-07 ENCOUNTER — Encounter: Payer: Self-pay | Admitting: Dermatology

## 2020-06-12 DIAGNOSIS — E7800 Pure hypercholesterolemia, unspecified: Secondary | ICD-10-CM | POA: Diagnosis not present

## 2020-06-12 DIAGNOSIS — Z79899 Other long term (current) drug therapy: Secondary | ICD-10-CM | POA: Diagnosis not present

## 2020-06-12 DIAGNOSIS — G2581 Restless legs syndrome: Secondary | ICD-10-CM | POA: Diagnosis not present

## 2020-06-12 DIAGNOSIS — K219 Gastro-esophageal reflux disease without esophagitis: Secondary | ICD-10-CM | POA: Diagnosis not present

## 2020-06-12 DIAGNOSIS — R739 Hyperglycemia, unspecified: Secondary | ICD-10-CM | POA: Diagnosis not present

## 2020-06-12 DIAGNOSIS — I10 Essential (primary) hypertension: Secondary | ICD-10-CM | POA: Diagnosis not present

## 2020-06-22 DIAGNOSIS — H2513 Age-related nuclear cataract, bilateral: Secondary | ICD-10-CM | POA: Diagnosis not present

## 2020-07-12 ENCOUNTER — Ambulatory Visit: Payer: PPO

## 2020-07-13 ENCOUNTER — Other Ambulatory Visit: Payer: Self-pay

## 2020-07-13 ENCOUNTER — Ambulatory Visit: Payer: PPO | Attending: Family Medicine

## 2020-07-13 DIAGNOSIS — M25674 Stiffness of right foot, not elsewhere classified: Secondary | ICD-10-CM | POA: Diagnosis not present

## 2020-07-13 DIAGNOSIS — M79672 Pain in left foot: Secondary | ICD-10-CM | POA: Diagnosis not present

## 2020-07-13 DIAGNOSIS — M25675 Stiffness of left foot, not elsewhere classified: Secondary | ICD-10-CM

## 2020-07-13 DIAGNOSIS — M79671 Pain in right foot: Secondary | ICD-10-CM | POA: Insufficient documentation

## 2020-07-13 NOTE — Therapy (Signed)
[place] [facility] [address], [place], [address] Phone: [phone]   Fax:  [phone]  Physical Therapy Evaluation  Patient Details  Name: Claudia Terry MRN: [id number] Date of Birth: 31-Oct-1945 Referring Provider (PT): babaoff MD   Encounter Date: 07/13/2020   PT End of Session - 07/13/20 1935    Visit Number 1    Number of Visits 17    Date for PT Re-Evaluation 09/06/20    PT Start Time 1830    PT Stop Time 1930    PT Time Calculation (min) 60 min    Activity Tolerance Patient tolerated treatment well    Behavior During Therapy [facility] for tasks assessed/performed           Past Medical History:  Diagnosis Date  . Arthritis   . 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 GERD (gastroesophageal reflux disease) 8/01    Dr. Tiffany Kocher  . Hypertension   . Low back pain 11/95  . Menopause   . Osteoarthritis   . Restless leg syndrome 1999    Past Surgical History:  Procedure Laterality Date  . [place]   X2  . COLONOSCOPY  12/06/11   normal  . COLONOSCOPY WITH PROPOFOL N/A 04/13/2018   Procedure: COLONOSCOPY WITH PROPOFOL;  Surgeon: Manya Silvas, MD;  Location[facility] ENDOSCOPY;  Service: Endoscopy;  Laterality: N/A;  . ESOPHAGOGASTRODUODENOSCOPY (EGD) WITH PROPOFOL N/A 02/23/2015   Procedure: ESOPHAGOGASTRODUODENOSCOPY (EGD) WITH PROPOFOL;  Surgeon: Manya Silvas, MD;  Location: [facility] ENDOSCOPY;  Service: Endoscopy;  Laterality: N/A;  . ESOPHAGOGASTRODUODENOSCOPY (EGD) WITH PROPOFOL N/A 04/13/2018   Procedure: ESOPHAGOGASTRODUODENOSCOPY (EGD) WITH PROPOFOL;  Surgeon: Manya Silvas, MD;  [facility] ENDOSCOPY;  Service: Endoscopy;  Laterality: N/A;  . HIP CLOSED REDUCTION Left 08/08/2015   Procedure: CLOSED REDUCTION HIP;  Surgeon: Dereck Leep, MD;  Location: [facility] ORS;  Service: Orthopedics;  Laterality: Left;  . HIP CLOSED REDUCTION Left  09/18/2015   Procedure: CLOSED REDUCTION HIP;  Surgeon: Hessie Knows, MD;  Location: [facility] ORS;  Service: Orthopedics;  Laterality: Left;  . HIP CLOSED REDUCTION Left 12/01/2015   Procedure: CLOSED REDUCTION HIP;  Surgeon: Dereck Leep, MD;  Location: [facility] ORS;  Service: Orthopedics;  Laterality: Left;  no prep no incision  . JOINT REPLACEMENT  10/2016   left knee  . KNEE ARTHROPLASTY Left 10/28/2016   Procedure: COMPUTER ASSISTED TOTAL KNEE ARTHROPLASTY;  Surgeon: Dereck Leep, MD;  Location: [facility] ORS;  Service: Orthopedics;  Laterality: Left;  . KNEE ARTHROPLASTY Right 07/07/2017   Procedure: COMPUTER ASSISTED TOTAL KNEE ARTHROPLASTY;  Surgeon: Dereck Leep, MD;  Location: [facility] ORS;  Service: Orthopedics;  Laterality: Right;  . KNEE ARTHROSCOPY Right 11/14/2014   Procedure: ARTHROSCOPY KNEE;  Surgeon: Dereck Leep, MD;  Location: [facility] ORS;  Service: Orthopedics;  Laterality: Right;  Posterior horn menicus, anterior lateral menicus grade 3 chondromalacia  . phlebitis groin    . right foot surgery    . SHOULDER ARTHROSCOPY WITH OPEN ROTATOR CUFF REPAIR Left 03/10/2019   Procedure: LEFT SHOULDER ARTHROSCOPY, ARTHROSCOPIC ROTATOR CUFF REPAIR, DISTAL CLAVICLE EXCISION, SUBACROMIAL DECOMPRESSION, INTRA-ARTICULAR DEBRIDEMENT ;  Surgeon: Lovell Sheehan, MD;  Location: [facility] ORS;  Service: Orthopedics;  Laterality: Left;  . SHOULDER ARTHROSCOPY WITH ROTATOR CUFF REPAIR Left 06/21/2019   Procedure: SHOULDER ARTHROSCOPY WITH EXTENSIVE INTRAARTICULAR DEBRIDEMENT WITH REMOVAL OF HARDWARE;  Surgeon: Kurtis Bushman  R, MD;  Location: [place];  Service: Orthopedics;  Laterality: Left;  . TONSILLECTOMY    . TOTAL HIP ARTHROPLASTY Left 05/08/11   necrosis of hip  . TOTAL HIP ARTHROPLASTY Right 06/14/2015   Procedure: TOTAL HIP ARTHROPLASTY;  Surgeon: Dereck Leep, MD;  Location: [facility] ORS;  Service: Orthopedics;  Laterality: Right;  . TOTAL HIP REVISION Left 02/21/2016   Procedure: TOTAL HIP  REVISION;  Surgeon: Dereck Leep, MD;  Location: [facility] ORS;  Service: Orthopedics;  Laterality: Left;    There were no vitals filed for this visit.    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 Patient states her increased pain limited her ability to walk for long periods of time and performing prolonged standing. Patient states she would like to return to travel, transferring from floor to standing, and exercises. Patient would like to return to walking, water aerobics, and regualr weight bearing activities. Patient states she is afraid of falling, states she would like to addres this concern. Patient states she went to [facility] to have a ProCure along the ankle. Decreased pain with rest. Inrceased pain with activities.    Pertinent History PMH: B TKA, B THA    Limitations Walking;Standing    How long can you sit comfortably? unlimited    How long can you stand comfortably? 10 min    How long can you walk comfortably? unknown    Patient Stated Goals Return to regular activities    Currently in Pain? Yes    Pain Score 5    10/10 Worst; best 2/10   Pain Location Foot    Pain Orientation Right;Left    Pain Descriptors / Indicators Aching    Pain Type Surgical pain    Pain Onset More than a month ago    Pain Frequency Intermittent              [facility] PT Assessment - 07/13/20 1946      Assessment   Medical Diagnosis Left foot pain    Referring Provider (PT) babaoff MD    Onset Date/Surgical Date 06/10/20    Hand Dominance Right    Next MD Visit unknown    Prior Therapy yes      Balance Screen   Has the patient fallen in the past 6 months No    Has the patient had a decrease in activity level because of a fear of falling?  Yes    Is the patient reluctant to leave their home because of a fear of falling?  No      Home Manufacturing systems engineer residence      Prior Function   Level of Independence Independent with basic ADLs    Vocation Retired    Biomedical scientist N/A    Leisure Traveling, walking, exercising      Observation/Other Assessments   Observations Decreased weight onto R LE in standing      Sensation   Light Touch Appears Intact      Functional Tests   Functional tests Sit to Stand      Sit to Stand   Comments Increased time required to perform      ROM / Strength   AROM / PROM / Strength AROM;PROM;Strength      AROM   AROM Assessment Site Ankle;Hip  Right/Left Hip Right;Left    Right Hip Extension 10    Right Hip Flexion 100    Right Hip External Rotation  10    Right Hip Internal Rotation  20    Right Hip ABduction 20    Left Hip Extension 10    Left Hip Flexion 100    Left Hip External Rotation  5    Left Hip Internal Rotation  20    Left Hip ABduction 20    Right/Left Ankle Right;Left    Right Ankle Dorsiflexion 5    Right Ankle Plantar Flexion 40    Right Ankle Inversion 10    Right Ankle Eversion 15    Left Ankle Dorsiflexion 5    Left Ankle Plantar Flexion 46    Left Ankle Inversion 15    Left Ankle Eversion 15      PROM   PROM Assessment Site Ankle    Right/Left Ankle Right;Left    Right Ankle Dorsiflexion 10    Right Ankle Plantar Flexion 55    Right Ankle Inversion 35    Right Ankle Eversion 20    Left Ankle Dorsiflexion 10    Left Ankle Plantar Flexion 55    Left Ankle Inversion 35    Left Ankle Eversion 20      Strength   Strength Assessment Site Hip;Knee;Ankle    Right/Left Hip Right;Left    Right Hip Flexion 4/5    Right Hip Extension 4/5    Right Hip External Rotation  2/5    Right Hip Internal Rotation 4-/5    Right Hip ABduction 4-/5    Left Hip Flexion 4/5    Left Hip Extension 4/5    Left Hip External Rotation 2/5    Left Hip Internal Rotation 4-/5    Left Hip ABduction 4-/5    Right/Left Knee Right;Left    Right Knee Extension  5/5    Left Knee Extension 4+/5    Right/Left Ankle Right;Left    Right Ankle Dorsiflexion 4/5    Right Ankle Plantar Flexion 3/5    Right Ankle Inversion 3-/5    Right Ankle Eversion 4/5    Left Ankle Dorsiflexion 4/5    Left Ankle Plantar Flexion 3/5    Left Ankle Inversion 3-/5    Left Ankle Eversion 4+/5      Palpation   Palpation comment TTP along plantar aspect of foot, along foot invertors      Ambulation/Gait   Gait velocity .59m/s    Gait Comments Trendelenberg B, ER hip on R vs. L, slow speed           Objective measurements completed on examination: See above findings.   TREATMENT Therapeutic Exercise Seated ankle alphabet -- x 10 - added to HEP Sit to stands educated on performance and added to HEP Seated plantarflexion with blue theraband -- x 10 -- added to HEP educated on performance  Performed exercises to improve strength and AROM            PT Education - 07/13/20 1934    Education Details POC; HEP: Sit to stand, ankle alphebet, seated plantarflexion    Person(s) Educated Patient    Methods Explanation;Demonstration    Comprehension Returned demonstration;Verbalized understanding            PT Short Term Goals - 07/13/20 1941      PT SHORT TERM GOAL #1   Title Patient will be independent with HEP to continue benefits of therapy  after discharge    Baseline dependent    Time 4    Period Weeks    Status New             PT Long Term Goals - 07/13/20 1941      PT LONG TERM GOAL #1   Title Patient will be able to walk > 30 min without increase in pain to indicate improvement in ankle funciton and return to aerobic exercise    Baseline <5 min    Time 8    Period Weeks    Status New    Target Date 09/07/20      PT LONG TERM GOAL #2   Title Patient will demonstrate significant improvement in FOTO score to indicate significant improvement in ankle functioning    Time 8    Period Weeks    Status New      PT LONG TERM GOAL #3    Title Patient will improve 95mwt to over 1 m/s with LRAD to better perform community amb with less fall risk.    Baseline .57 m/s    Time 8    Period Weeks    Status New    Target Date 09/08/19                  Plan - 07/13/20 1936    Clinical Impression Statement Patient is a 75 yo right hand dominant female presenting with increased B foot/ankle pain secondary to having foot surgery in Jan 2022 and Dec 2021. Paitent demonstrates increased foot/ankle dysfunction as indicated by decreased ankle AROM and MMT along the affected LEs, decreased static balance with inability to acheive tandem stance safely, and decreased 69mWT. Patient will benefit from further skilled therapy focused on improving these limitations to return to prior level of function.    Personal Factors and Comorbidities Comorbidity 3+    Comorbidities obesity, B TKAs, B THAs    Examination-Activity Limitations Lift;Squat;Stairs    Examination-Participation Restrictions Church;Community Activity;Cleaning    Stability/Clinical Decision Making Stable/Uncomplicated    Clinical Decision Making Low    Rehab Potential Good    PT Frequency 2x / week    PT Duration 8 weeks    PT Treatment/Interventions Electrical Stimulation;Cryotherapy;Moist Heat;Functional mobility training;Therapeutic activities;Therapeutic exercise;Gait training;Neuromuscular re-education;Manual techniques;Dry needling;Passive range of motion;Patient/family education;Joint Manipulations;Spinal Manipulations    PT Next Visit Plan Improve ankle AROM and LE strength    PT Home Exercise Plan See education section    Consulted and Agree with Plan of Care Patient           Patient will benefit from skilled therapeutic intervention in order to improve the following deficits and impairments:  Abnormal gait,Pain,Decreased coordination,Decreased mobility,Decreased scar mobility,Increased muscle spasms,Postural dysfunction,Decreased endurance,Decreased activity  tolerance,Decreased range of motion,Decreased strength,Hypomobility,Decreased balance,Difficulty walking  Visit Diagnosis: Pain in right foot  Pain in left foot  Stiffness of left foot, not elsewhere classified  Stiffness of right foot, not elsewhere classified     Problem List Patient Active Problem List   Diagnosis Date Noted  . Generalized osteoarthritis of multiple sites 07/28/2018  . Varicose veins of both legs with edema 07/28/2018  . FH: colon polyps 02/10/2018  . S/P total knee arthroplasty 07/07/2017  . Status post total left knee replacement 10/28/2016  . Failed total hip arthroplasty with dislocation ([place]) 12/01/2015  . S/P closed reduction of dislocated total hip prosthesis 08/08/2015  . S/P total hip arthroplasty 06/14/2015  . Benign essential HTN 12/28/2013  . Acid reflux 12/28/2013  .  HLD (hyperlipidemia) 12/28/2013  . Restless leg 12/28/2013    Blythe Stanford, PT DPT 07/13/2020, 7:53 PM  [place] [facility] [address] [place], [address] Phone: [phone]   Fax:  [phone]  Name: Claudia Terry MRN: [id number] Date of Birth: 10/04/1945

## 2020-07-18 ENCOUNTER — Ambulatory Visit: Payer: PPO

## 2020-07-18 ENCOUNTER — Other Ambulatory Visit: Payer: Self-pay

## 2020-07-18 DIAGNOSIS — M79671 Pain in right foot: Secondary | ICD-10-CM

## 2020-07-18 DIAGNOSIS — M25675 Stiffness of left foot, not elsewhere classified: Secondary | ICD-10-CM

## 2020-07-18 DIAGNOSIS — M79672 Pain in left foot: Secondary | ICD-10-CM

## 2020-07-18 DIAGNOSIS — M25674 Stiffness of right foot, not elsewhere classified: Secondary | ICD-10-CM

## 2020-07-19 NOTE — Therapy (Signed)
[place] [facility] [address], [place], [address] Phone: [phone]   Fax:  [phone]  Physical Therapy Treatment  Patient Details  Name: Claudia Terry MRN: [id number] Date of Birth: 1946-02-23 Referring Provider (PT): babaoff MD   Encounter Date: 07/18/2020   PT End of Session - 07/19/20 1411    Visit Number 2    Number of Visits 17    Date for PT Re-Evaluation 09/06/20    PT Start Time 1030    PT Stop Time 1115    PT Time Calculation (min) 45 min    Activity Tolerance Patient tolerated treatment well    Behavior During Therapy [facility] for tasks assessed/performed           Past Medical History:  Diagnosis Date  . Arthritis   . 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 GERD (gastroesophageal reflux disease) 8/01    Dr. Tiffany Kocher  . Hypertension   . Low back pain 11/95  . Menopause   . Osteoarthritis   . Restless leg syndrome 1999    Past Surgical History:  Procedure Laterality Date  . [place]   X2  . COLONOSCOPY  12/06/11   normal  . COLONOSCOPY WITH PROPOFOL N/A 04/13/2018   Procedure: COLONOSCOPY WITH PROPOFOL;  Surgeon: Manya Silvas, MD;  Location: [facility] ENDOSCOPY;  Service: Endoscopy;  Laterality: N/A;  . ESOPHAGOGASTRODUODENOSCOPY (EGD) WITH PROPOFOL N/A 02/23/2015   Procedure: ESOPHAGOGASTRODUODENOSCOPY (EGD) WITH PROPOFOL;  Surgeon: Manya Silvas, MD;  Location: [facility] ENDOSCOPY;  Service: Endoscopy;  Laterality: N/A;  . ESOPHAGOGASTRODUODENOSCOPY (EGD) WITH PROPOFOL N/A 04/13/2018   Procedure: ESOPHAGOGASTRODUODENOSCOPY (EGD) WITH PROPOFOL;  Surgeon: Manya Silvas, MD;  Location: [facility] ENDOSCOPY;  Service: Endoscopy;  Laterality: N/A;  . HIP CLOSED REDUCTION Left 08/08/2015   Procedure: CLOSED REDUCTION HIP;  Surgeon: Dereck Leep, MD;  Location: [facility] ORS;  Service: Orthopedics;  Laterality: Left;  . HIP CLOSED REDUCTION Left  09/18/2015   Procedure: CLOSED REDUCTION HIP;  Surgeon: Hessie Knows, MD;  Location: [facility] ORS;  Service: Orthopedics;  Laterality: Left;  . HIP CLOSED REDUCTION Left 12/01/2015   Procedure: CLOSED REDUCTION HIP;  Surgeon: Dereck Leep, MD;  Location: [facility] ORS;  Service: Orthopedics;  Laterality: Left;  no prep no incision  . JOINT REPLACEMENT  10/2016   left knee  . KNEE ARTHROPLASTY Left 10/28/2016   Procedure: COMPUTER ASSISTED TOTAL KNEE ARTHROPLASTY;  Surgeon: Dereck Leep, MD;  Location: [facility] ORS;  Service: Orthopedics;  Laterality: Left;  . KNEE ARTHROPLASTY Right 07/07/2017   Procedure: COMPUTER ASSISTED TOTAL KNEE ARTHROPLASTY;  Surgeon: Dereck Leep, MD;  Location: [facility]S;  Service: Orthopedics;  Laterality: Right;  . KNEE ARTHROSCOPY Right 11/14/2014   Procedure: ARTHROSCOPY KNEE;  Surgeon: Dereck Leep, MD;  Location: [facility] ORS;  Service: Orthopedics;  Laterality: Right;  Posterior horn menicus, anterior lateral menicus grade 3 chondromalacia  . phlebitis groin    . right foot surgery    . SHOULDER ARTHROSCOPY WITH OPEN ROTATOR CUFF REPAIR Left 03/10/2019   Procedure: LEFT SHOULDER ARTHROSCOPY, ARTHROSCOPIC ROTATOR CUFF REPAIR, DISTAL CLAVICLE EXCISION, SUBACROMIAL DECOMPRESSION, INTRA-ARTICULAR DEBRIDEMENT ;  Surgeon: Lovell Sheehan, MD;  Location: [facility]S;  Service: Orthopedics;  Laterality: Left;  . SHOULDER ARTHROSCOPY WITH ROTATOR CUFF REPAIR Left 06/21/2019   Procedure: SHOULDER ARTHROSCOPY WITH EXTENSIVE INTRAARTICULAR DEBRIDEMENT WITH REMOVAL OF HARDWARE;  Surgeon: Kurtis Bushman  R, MD;  Location: [place];  Service: Orthopedics;  Laterality: Left;  . TONSILLECTOMY    . TOTAL HIP ARTHROPLASTY Left 05/08/11   necrosis of hip  . TOTAL HIP ARTHROPLASTY Right 06/14/2015   Procedure: TOTAL HIP ARTHROPLASTY;  Surgeon: Dereck Leep, MD;  Location: [facility];  Service: Orthopedics;  Laterality: Right;  . TOTAL HIP REVISION Left 02/21/2016   Procedure: TOTAL HIP  REVISION;  Surgeon: Dereck Leep, MD;  Location: [facility];  Service: Orthopedics;  Laterality: Left;    There were no vitals filed for this visit.   Subjective Assessment - 07/18/20 1037    Subjective Patient states painful after the previous session. Patient states a 3-4 pain currently along the affect feet.    Pertinent History PMH: B TKA, B THA    Limitations Walking;Standing    How long can you sit comfortably? unlimited    How long can you stand comfortably? 10 min    How long can you walk comfortably? unknown    Patient Stated Goals Return to regular activities    Currently in Pain? Yes    Pain Score 3     Pain Location Foot    Pain Orientation Right;Left    Pain Descriptors / Indicators Aching    Pain Type Chronic pain    Pain Onset More than a month ago               TREATMENT Therapeutic Exercise Resisted ankle inversion - YTB 2 x 10 Resisted ankle eversion - YTB 2 x 10 Resisted dorsiflexion in sitting - x 10  Resisted plantarflexion in sitting - x 10  Foot planted on ground inversion/eversion (windshield wipers) - x 10  Heel raises in standing with UE support - x 10 (compensations) stopped Heel raises on [employer] level 13, level 10  -- x20  Hip abduction in standing with UE support - x 20  Hip abduction/extension combination with UE support - x 20  Performed exercises to address pain and spasms      PT Education - 07/19/20 1405    Education Details form/technique with exercise; HEP: hip abduction, windshield wipers    Person(s) Educated Patient    Methods Explanation;Demonstration    Comprehension Verbalized understanding;Returned demonstration            PT Short Term Goals - 07/13/20 1941      PT SHORT TERM GOAL #1   Title Patient will be independent with HEP to continue benefits of therapy after discharge    Baseline dependent    Time 4    Period Weeks    Status New             PT Long Term Goals - 07/13/20 1941      PT LONG TERM  GOAL #1   Title Patient will be able to walk > 30 min without increase in pain to indicate improvement in ankle funciton and return to aerobic exercise    Baseline <5 min    Time 8    Period Weeks    Status New    Target Date 09/07/20      PT LONG TERM GOAL #2   Title Patient will demonstrate significant improvement in FOTO score to indicate significant improvement in ankle functioning    Time 8    Period Weeks    Status New      PT LONG TERM GOAL #3   Title Patient will improve 36mwt to over 1  m/s with LRAD to better perform community amb with less fall risk.    Baseline .57 m/s    Time 8    Period Weeks    Status New    Target Date 09/08/19                 Plan - 07/19/20 1413    Clinical Impression Statement Patient demonstrates weakness with performing ankle inversion on the affected side. This is improved compared to the previous session indicating functional carryover between sessions. Patient demonstrates difficulty with difficulty with SLS indicating poor propioception. Patient will benefit from further skilled therapy focused on improving ankle stabilization to return to prior level of function.    Personal Factors and Comorbidities Comorbidity 3+    Comorbidities obesity, B TKAs, B THAs    Examination-Activity Limitations Lift;Squat;Stairs    Examination-Participation Restrictions Church;Community Activity;Cleaning    Stability/Clinical Decision Making Stable/Uncomplicated    Rehab Potential Good    PT Frequency 2x / week    PT Duration 8 weeks    PT Treatment/Interventions Electrical Stimulation;Cryotherapy;Moist Heat;Functional mobility training;Therapeutic activities;Therapeutic exercise;Gait training;Neuromuscular re-education;Manual techniques;Dry needling;Passive range of motion;Patient/family education;Joint Manipulations;Spinal Manipulations    PT Next Visit Plan Improve ankle AROM and LE strength    PT Home Exercise Plan See education section     Consulted and Agree with Plan of Care Patient           Patient will benefit from skilled therapeutic intervention in order to improve the following deficits and impairments:  Abnormal gait,Pain,Decreased coordination,Decreased mobility,Decreased scar mobility,Increased muscle spasms,Postural dysfunction,Decreased endurance,Decreased activity tolerance,Decreased range of motion,Decreased strength,Hypomobility,Decreased balance,Difficulty walking  Visit Diagnosis: Pain in right foot  Pain in left foot  Stiffness of left foot, not elsewhere classified  Stiffness of right foot, not elsewhere classified     Problem List Patient Active Problem List   Diagnosis Date Noted  . Generalized osteoarthritis of multiple sites 07/28/2018  . Varicose veins of both legs with edema 07/28/2018  . FH: colon polyps 02/10/2018  . S/P total knee arthroplasty 07/07/2017  . Status post total left knee replacement 10/28/2016  . Failed total hip arthroplasty with dislocation ([place]) 12/01/2015  . S/P closed reduction of dislocated total hip prosthesis 08/08/2015  . S/P total hip arthroplasty 06/14/2015  . Benign essential HTN 12/28/2013  . Acid reflux 12/28/2013  . HLD (hyperlipidemia) 12/28/2013  . Restless leg 12/28/2013    Blythe Stanford, PT DPT 07/19/2020, 2:17 PM  [place] [facility] [address], [place], [address] Phone: [phone]   Fax:  [phone]  Name: Claudia Terry MRN: [id number] Date of Birth: 10/29/1945

## 2020-07-20 ENCOUNTER — Ambulatory Visit: Payer: PPO

## 2020-07-20 ENCOUNTER — Other Ambulatory Visit: Payer: Self-pay

## 2020-07-20 DIAGNOSIS — M79672 Pain in left foot: Secondary | ICD-10-CM

## 2020-07-20 DIAGNOSIS — M79671 Pain in right foot: Secondary | ICD-10-CM

## 2020-07-20 DIAGNOSIS — M25675 Stiffness of left foot, not elsewhere classified: Secondary | ICD-10-CM

## 2020-07-20 NOTE — Therapy (Signed)
[facility] [address] [place], [address] Phone: [phone]   Fax:  [phone]  Physical Therapy Treatment  Patient Details  Name: Claudia Terry MRN: [id number] Date of Birth: 1945/12/03 Referring Provider (PT): babaoff MD   Encounter Date: 07/20/2020   PT End of Session - 07/20/20 1708    Visit Number 3    Number of Visits 17    Date for PT Re-Evaluation 09/06/20    PT Start Time 5956    PT Stop Time 1730    PT Time Calculation (min) 45 min    Activity Tolerance Patient tolerated treatment well    Behavior During Therapy [facility] for tasks assessed/performed           Past Medical History:  Diagnosis Date  . Arthritis   . 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 GERD (gastroesophageal reflux disease) 8/01    Dr. Tiffany Kocher  . Hypertension   . Low back pain 11/95  . Menopause   . Osteoarthritis   . Restless leg syndrome 1999    Past Surgical History:  Procedure Laterality Date  . [place]   X2  . COLONOSCOPY  12/06/11   normal  . COLONOSCOPY WITH PROPOFOL N/A 04/13/2018   Procedure: COLONOSCOPY WITH PROPOFOL;  Surgeon: Manya Silvas, MD;  Location: [facility] ENDOSCOPY;  Service: Endoscopy;  Laterality: N/A;  . ESOPHAGOGASTRODUODENOSCOPY (EGD) WITH PROPOFOL N/A 02/23/2015   Procedure: ESOPHAGOGASTRODUODENOSCOPY (EGD) WITH PROPOFOL;  Surgeon: Manya Silvas, MD;  Location: [facility] ENDOSCOPY;  Service: Endoscopy;  Laterality: N/A;  . ESOPHAGOGASTRODUODENOSCOPY (EGD) WITH PROPOFOL N/A 04/13/2018   Procedure: ESOPHAGOGASTRODUODENOSCOPY (EGD) WITH PROPOFOL;  Surgeon: Manya Silvas, MD;  Location: [facility] ENDOSCOPY;  Service: Endoscopy;  Laterality: N/A;  . HIP CLOSED REDUCTION Left 08/08/2015   Procedure: CLOSED REDUCTION HIP;  Surgeon: Dereck Leep, MD;  Location: [facility] ORS;  Service: Orthopedics;  Laterality: Left;  . HIP CLOSED REDUCTION Left  09/18/2015   Procedure: CLOSED REDUCTION HIP;  Surgeon: Hessie Knows, MD;  Location: [facility] ORS;  Service: Orthopedics;  Laterality: Left;  . HIP CLOSED REDUCTION Left 12/01/2015   Procedure: CLOSED REDUCTION HIP;  Surgeon: Dereck Leep, MD;  Location: [facility]S;  Service: Orthopedics;  Laterality: Left;  no prep no incision  . JOINT REPLACEMENT  10/2016   left knee  . KNEE ARTHROPLASTY Left 10/28/2016   Procedure: COMPUTER ASSISTED TOTAL KNEE ARTHROPLASTY;  Surgeon: Dereck Leep, MD;  Location: [facility] ORS;  Service: Orthopedics;  Laterality: Left;  . KNEE ARTHROPLASTY Right 07/07/2017   Procedure: COMPUTER ASSISTED TOTAL KNEE ARTHROPLASTY;  Surgeon: Dereck Leep, MD;  Location: [facility]S;  Service: Orthopedics;  Laterality: Right;  . KNEE ARTHROSCOPY Right 11/14/2014   Procedure: ARTHROSCOPY KNEE;  Surgeon: Dereck Leep, MD;  Location: [facility] ORS;  Service: Orthopedics;  Laterality: Right;  Posterior horn menicus, anterior lateral menicus grade 3 chondromalacia  . phlebitis groin    . right foot surgery    . SHOULDER ARTHROSCOPY WITH OPEN ROTATOR CUFF REPAIR Left 03/10/2019   Procedure: LEFT SHOULDER ARTHROSCOPY, ARTHROSCOPIC ROTATOR CUFF REPAIR, DISTAL CLAVICLE EXCISION, SUBACROMIAL DECOMPRESSION, INTRA-ARTICULAR DEBRIDEMENT ;  Surgeon: Lovell Sheehan, MD;  Location: [facility];  Service: Orthopedics;  Laterality: Left;  . SHOULDER ARTHROSCOPY WITH ROTATOR CUFF REPAIR Left 06/21/2019   Procedure: SHOULDER ARTHROSCOPY WITH EXTENSIVE INTRAARTICULAR DEBRIDEMENT WITH REMOVAL OF HARDWARE;  Surgeon: Kurtis Bushman  R, MD;  Location: [place];  Service: Orthopedics;  Laterality: Left;  . TONSILLECTOMY    . TOTAL HIP ARTHROPLASTY Left 05/08/11   necrosis of hip  . TOTAL HIP ARTHROPLASTY Right 06/14/2015   Procedure: TOTAL HIP ARTHROPLASTY;  Surgeon: Dereck Leep, MD;  Location: [facility]S;  Service: Orthopedics;  Laterality: Right;  . TOTAL HIP REVISION Left 02/21/2016   Procedure: TOTAL HIP  REVISION;  Surgeon: Dereck Leep, MD;  Location: [facility]S;  Service: Orthopedics;  Laterality: Left;    There were no vitals filed for this visit.   Subjective Assessment - 07/20/20 1648    Subjective Patient reports her hips were sore after the previous session. Patient states no major pain currently and states the windshield wipers were difficult.    Pertinent History PMH: B TKA, B THA    Limitations Walking;Standing    How long can you sit comfortably? unlimited    How long can you stand comfortably? 10 min    How long can you walk comfortably? unknown    Patient Stated Goals Return to regular activities    Currently in Pain? Yes    Pain Score 3     Pain Location Foot    Pain Orientation Right;Left    Pain Descriptors / Indicators Aching    Pain Type Chronic pain    Pain Onset More than a month ago    Pain Frequency Intermittent              TREATMENT Therapeutic Exercise Resisted ankle inversion - YTB 2 x 10 Resisted ankle eversion - YTB 2 x 10 Windshield wipers with foot flat - x10 Soleus heel raises in sitting - x 10  Heel raises on Total Gym level 13, level 10  -- x20 B Pre gait static weight shift forward/backward - x 10 B Pre gait dynamic with a step forward/backward - x 10 B Performed exercises to address pain and spasms       PT Education - 07/20/20 1707    Education Details form/technique with exercise; seated soleus heel lifts    Person(s) Educated Patient    Methods Explanation;Demonstration    Comprehension Verbalized understanding;Returned demonstration            PT Short Term Goals - 07/13/20 1941      PT SHORT TERM GOAL #1   Title Patient will be independent with HEP to continue benefits of therapy after discharge    Baseline dependent    Time 4    Period Weeks    Status New             PT Long Term Goals - 07/13/20 1941      PT LONG TERM GOAL #1   Title Patient will be able to walk > 30 min without increase in pain to indicate  improvement in ankle funciton and return to aerobic exercise    Baseline <5 min    Time 8    Period Weeks    Status New    Target Date 09/07/20      PT LONG TERM GOAL #2   Title Patient will demonstrate significant improvement in FOTO score to indicate significant improvement in ankle functioning    Time 8    Period Weeks    Status New      PT LONG TERM GOAL #3   Title Patient will improve 66mwt to over 1 m/s with LRAD to better perform community amb with less fall risk.  Baseline .57 m/s    Time 8    Period Weeks    Status New    Target Date 09/08/19                 Plan - 07/20/20 1716    Clinical Impression Statement Performed exercises to further address ankle stabilization throughout the day's session. Patient continues to demonstrate significant weakness with heel raise performance, indicating poor gastroc strength and coordination. Patient however is improved compared to the previous session with this motion. Patient will benefit from further skilled therapy to return to prior level of function.    Personal Factors and Comorbidities Comorbidity 3+    Comorbidities obesity, B TKAs, B THAs    Examination-Activity Limitations Lift;Squat;Stairs    Examination-Participation Restrictions Church;Community Activity;Cleaning    Stability/Clinical Decision Making Stable/Uncomplicated    Rehab Potential Good    PT Frequency 2x / week    PT Duration 8 weeks    PT Treatment/Interventions Electrical Stimulation;Cryotherapy;Moist Heat;Functional mobility training;Therapeutic activities;Therapeutic exercise;Gait training;Neuromuscular re-education;Manual techniques;Dry needling;Passive range of motion;Patient/family education;Joint Manipulations;Spinal Manipulations    PT Next Visit Plan Improve ankle AROM and LE strength    PT Home Exercise Plan See education section    Consulted and Agree with Plan of Care Patient           Patient will benefit from skilled therapeutic  intervention in order to improve the following deficits and impairments:  Abnormal gait,Pain,Decreased coordination,Decreased mobility,Decreased scar mobility,Increased muscle spasms,Postural dysfunction,Decreased endurance,Decreased activity tolerance,Decreased range of motion,Decreased strength,Hypomobility,Decreased balance,Difficulty walking  Visit Diagnosis: Pain in left foot  Pain in right foot  Stiffness of left foot, not elsewhere classified     Problem List Patient Active Problem List   Diagnosis Date Noted  . Generalized osteoarthritis of multiple sites 07/28/2018  . Varicose veins of both legs with edema 07/28/2018  . FH: colon polyps 02/10/2018  . S/P total knee arthroplasty 07/07/2017  . Status post total left knee replacement 10/28/2016  . Failed total hip arthroplasty with dislocation ([place]) 12/01/2015  . S/P closed reduction of dislocated total hip prosthesis 08/08/2015  . S/P total hip arthroplasty 06/14/2015  . Benign essential HTN 12/28/2013  . Acid reflux 12/28/2013  . HLD (hyperlipidemia) 12/28/2013  . Restless leg 12/28/2013    Blythe Stanford, PT DPT 07/20/2020, 5:53 PM  [facility] [address], [place], [address] Phone: [phone]   Fax:  [phone]  Name: Claudia Terry MRN: [id number] Date of Birth: 01/25/1946

## 2020-07-24 ENCOUNTER — Ambulatory Visit: Payer: PPO

## 2020-07-24 ENCOUNTER — Other Ambulatory Visit: Payer: Self-pay

## 2020-07-24 DIAGNOSIS — M79671 Pain in right foot: Secondary | ICD-10-CM

## 2020-07-24 DIAGNOSIS — M79672 Pain in left foot: Secondary | ICD-10-CM

## 2020-07-24 DIAGNOSIS — M25674 Stiffness of right foot, not elsewhere classified: Secondary | ICD-10-CM

## 2020-07-24 DIAGNOSIS — M25675 Stiffness of left foot, not elsewhere classified: Secondary | ICD-10-CM

## 2020-07-25 NOTE — Therapy (Signed)
[facility] [address] [place], [address] Phone: [phone]   Fax:  [phone]  Physical Therapy Treatment  Patient Details  Name: Claudia Terry MRN: [id number] Date of Birth: [birth year] Referring Provider (PT): babaoff MD   Encounter Date: 07/24/2020   PT End of Session - 07/24/20 1612    Visit Number 4    Number of Visits 17    Date for PT Re-Evaluation 09/06/20    PT Start Time 1600    PT Stop Time 1645    PT Time Calculation (min) 45 min    Activity Tolerance Patient tolerated treatment well    Behavior During Therapy [facility] for tasks assessed/performed           Past Medical History:  Diagnosis Date  . Arthritis   . 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 GERD (gastroesophageal reflux disease) 8/01    Dr. Tiffany Kocher  . Hypertension   . Low back pain 11/95  . Menopause   . Osteoarthritis   . Restless leg syndrome 1999    Past Surgical History:  Procedure Laterality Date  . [place]   X2  . COLONOSCOPY  12/06/11   normal  . COLONOSCOPY WITH PROPOFOL N/A 04/13/2018   Procedure: COLONOSCOPY WITH PROPOFOL;  Surgeon: Manya Silvas, MD;  Location: [facility] ENDOSCOPY;  Service: Endoscopy;  Laterality: N/A;  . ESOPHAGOGASTRODUODENOSCOPY (EGD) WITH PROPOFOL N/A 02/23/2015   Procedure: ESOPHAGOGASTRODUODENOSCOPY (EGD) WITH PROPOFOL;  Surgeon: Manya Silvas, MD;  Location: [facility] ENDOSCOPY;  Service: Endoscopy;  Laterality: N/A;  . ESOPHAGOGASTRODUODENOSCOPY (EGD) WITH PROPOFOL N/A 04/13/2018   Procedure: ESOPHAGOGASTRODUODENOSCOPY (EGD) WITH PROPOFOL;  Surgeon: Manya Silvas, MD;  Location: [facility] ENDOSCOPY;  Service: Endoscopy;  Laterality: N/A;  . HIP CLOSED REDUCTION Left 08/08/2015   Procedure: CLOSED REDUCTION HIP;  Surgeon: Dereck Leep, MD;  Location: [facility] ORS;  Service: Orthopedics;  Laterality: Left;  . HIP CLOSED REDUCTION Left  09/18/2015   Procedure: CLOSED REDUCTION HIP;  Surgeon: Hessie Knows, MD;  Location: [facility]S;  Service: Orthopedics;  Laterality: Left;  . HIP CLOSED REDUCTION Left 12/01/2015   Procedure: CLOSED REDUCTION HIP;  Surgeon: Dereck Leep, MD;  Location: [facility];  Service: Orthopedics;  Laterality: Left;  no prep no incision  . JOINT REPLACEMENT  10/2016   left knee  . KNEE ARTHROPLASTY Left 10/28/2016   Procedure: COMPUTER ASSISTED TOTAL KNEE ARTHROPLASTY;  Surgeon: Dereck Leep, MD;  Location: [facility];  Service: Orthopedics;  Laterality: Left;  . KNEE ARTHROPLASTY Right 07/07/2017   Procedure: COMPUTER ASSISTED TOTAL KNEE ARTHROPLASTY;  Surgeon: Dereck Leep, MD;  Location: [facility];  Service: Orthopedics;  Laterality: Right;  . KNEE ARTHROSCOPY Right 11/14/2014   Procedure: ARTHROSCOPY KNEE;  Surgeon: Dereck Leep, MD;  Location: [facility];  Service: Orthopedics;  Laterality: Right;  Posterior horn menicus, anterior lateral menicus grade 3 chondromalacia  . phlebitis groin    . right foot surgery    . SHOULDER ARTHROSCOPY WITH OPEN ROTATOR CUFF REPAIR Left 03/10/2019   Procedure: LEFT SHOULDER ARTHROSCOPY, ARTHROSCOPIC ROTATOR CUFF REPAIR, DISTAL CLAVICLE EXCISION, SUBACROMIAL DECOMPRESSION, INTRA-ARTICULAR DEBRIDEMENT ;  Surgeon: Lovell Sheehan, MD;  Location: [facility];  Service: Orthopedics;  Laterality: Left;  . SHOULDER ARTHROSCOPY WITH ROTATOR CUFF REPAIR Left 06/21/2019   Procedure: SHOULDER ARTHROSCOPY WITH EXTENSIVE INTRAARTICULAR DEBRIDEMENT WITH REMOVAL OF HARDWARE;  Surgeon: Kurtis Bushman  R, MD;  Location: [place];  Service: Orthopedics;  Laterality: Left;  . TONSILLECTOMY    . TOTAL HIP ARTHROPLASTY Left 05/08/11   necrosis of hip  . TOTAL HIP ARTHROPLASTY Right 06/14/2015   Procedure: TOTAL HIP ARTHROPLASTY;  Surgeon: Dereck Leep, MD;  Location: [facility];  Service: Orthopedics;  Laterality: Right;  . TOTAL HIP REVISION Left 02/21/2016   Procedure: TOTAL HIP  REVISION;  Surgeon: Dereck Leep, MD;  Location: [facility];  Service: Orthopedics;  Laterality: Left;    There were no vitals filed for this visit.   Subjective Assessment - 07/24/20 1607    Subjective Patient states her ankles are feeling good, but still having difficulty with performing heel raises. Patient reports she was able to amb to church with just her cane this weekend.    Pertinent History PMH: B TKA, B THA    Limitations Walking;Standing    How long can you sit comfortably? unlimited    How long can you stand comfortably? 10 min    How long can you walk comfortably? unknown    Patient Stated Goals Return to regular activities    Currently in Pain? Yes    Pain Score 2     Pain Location Foot    Pain Orientation Right;Left    Pain Descriptors / Indicators Aching    Pain Type Chronic pain    Pain Onset More than a month ago    Pain Frequency Intermittent           TREATMENT Therapeutic Exercise Windshield wipers with foot flat - 2x10 Soleus heel raises in sitting - 2 x 10  Standing Heel raises - x 15 Semi Tandem on airex pad - 90 sec B Standing marches with unilateral UE support - x 15 Monster walks with YTB around knees - x 15 B Heel raises on Total Gym level 13, level 10  -- x20 B Pre gait dynamic with a step forward/backward - x 13 B   Performed exercises to address pain and spasms        PT Education - 07/24/20 1610    Education Details form/technique with exercise    Person(s) Educated Patient    Methods Explanation;Demonstration    Comprehension Verbalized understanding;Returned demonstration            PT Short Term Goals - 07/13/20 1941      PT SHORT TERM GOAL #1   Title Patient will be independent with HEP to continue benefits of therapy after discharge    Baseline dependent    Time 4    Period Weeks    Status New             PT Long Term Goals - 07/13/20 1941      PT LONG TERM GOAL #1   Title Patient will be able to walk > 30 min  without increase in pain to indicate improvement in ankle funciton and return to aerobic exercise    Baseline <5 min    Time 8    Period Weeks    Status New    Target Date 09/07/20      PT LONG TERM GOAL #2   Title Patient will demonstrate significant improvement in FOTO score to indicate significant improvement in ankle functioning    Time 8    Period Weeks    Status New      PT LONG TERM GOAL #3   Title Patient will improve 50mwt to over 1  m/s with LRAD to better perform community amb with less fall risk.    Baseline .57 m/s    Time 8    Period Weeks    Status New    Target Date 09/08/19                 Plan - 07/24/20 1631    Clinical Impression Statement Patient demonstrates ability to tolerate progression into greater amount of weight bearing without increase in pain indicating functional carryover between session. Patient continues to have increased weakness most notably with performing heel raises. Patient able to tolerate SLS and demonstrates good ankle strategies. Patient will benefit from further skilled therapy to return to prior level of function.    Personal Factors and Comorbidities Comorbidity 3+    Comorbidities obesity, B TKAs, B THAs    Examination-Activity Limitations Lift;Squat;Stairs    Examination-Participation Restrictions Church;Community Activity;Cleaning    Stability/Clinical Decision Making Stable/Uncomplicated    Rehab Potential Good    PT Frequency 2x / week    PT Duration 8 weeks    PT Treatment/Interventions Electrical Stimulation;Cryotherapy;Moist Heat;Functional mobility training;Therapeutic activities;Therapeutic exercise;Gait training;Neuromuscular re-education;Manual techniques;Dry needling;Passive range of motion;Patient/family education;Joint Manipulations;Spinal Manipulations    PT Next Visit Plan Improve ankle AROM and LE strength    PT Home Exercise Plan See education section    Consulted and Agree with Plan of Care Patient            Patient will benefit from skilled therapeutic intervention in order to improve the following deficits and impairments:  Abnormal gait,Pain,Decreased coordination,Decreased mobility,Decreased scar mobility,Increased muscle spasms,Postural dysfunction,Decreased endurance,Decreased activity tolerance,Decreased range of motion,Decreased strength,Hypomobility,Decreased balance,Difficulty walking  Visit Diagnosis: Pain in left foot  Pain in right foot  Stiffness of left foot, not elsewhere classified  Stiffness of right foot, not elsewhere classified     Problem List Patient Active Problem List   Diagnosis Date Noted  . Generalized osteoarthritis of multiple sites 07/28/2018  . Varicose veins of both legs with edema 07/28/2018  . FH: colon polyps 02/10/2018  . S/P total knee arthroplasty 07/07/2017  . Status post total left knee replacement 10/28/2016  . Failed total hip arthroplasty with dislocation ([place]) 12/01/2015  . S/P closed reduction of dislocated total hip prosthesis 08/08/2015  . S/P total hip arthroplasty 06/14/2015  . Benign essential HTN 12/28/2013  . Acid reflux 12/28/2013  . HLD (hyperlipidemia) 12/28/2013  . Restless leg 12/28/2013    Blythe Stanford, PT DPT 07/25/2020, 7:40 AM  [place] [address] [place], [address] Phone: [phone]   Fax:  [phone]  Name: Claudia Terry MRN: [id number] Date of Birth: 1946/03/04

## 2020-07-27 ENCOUNTER — Ambulatory Visit: Payer: PPO

## 2020-07-27 ENCOUNTER — Other Ambulatory Visit: Payer: Self-pay

## 2020-07-27 DIAGNOSIS — M25674 Stiffness of right foot, not elsewhere classified: Secondary | ICD-10-CM

## 2020-07-27 DIAGNOSIS — M79671 Pain in right foot: Secondary | ICD-10-CM | POA: Diagnosis not present

## 2020-07-27 DIAGNOSIS — M79672 Pain in left foot: Secondary | ICD-10-CM

## 2020-07-27 DIAGNOSIS — M25675 Stiffness of left foot, not elsewhere classified: Secondary | ICD-10-CM

## 2020-07-28 NOTE — Therapy (Signed)
[place] [facility] [address], [place], [address] Phone: [phone]   Fax:  [phone]  Physical Therapy Treatment  Patient Details  Name: Claudia Terry MRN: [id number] Date of Birth: 01/03/1946 Referring Provider (PT): babaoff MD   Encounter Date: 07/27/2020   PT End of Session - 07/27/20 1521    Visit Number 5    Number of Visits 17    Date for PT Re-Evaluation 09/06/20    PT Start Time 4431    PT Stop Time 1600    PT Time Calculation (min) 45 min    Activity Tolerance Patient tolerated treatment well    Behavior During Therapy [facility] for tasks assessed/performed           Past Medical History:  Diagnosis Date  . Arthritis   . 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 GERD (gastroesophageal reflux disease) 8/01    Dr. Tiffany Kocher  . Hypertension   . Low back pain 11/95  . Menopause   . Osteoarthritis   . Restless leg syndrome 1999    Past Surgical History:  Procedure Laterality Date  . [place]   X2  . COLONOSCOPY  12/06/11   normal  . COLONOSCOPY WITH PROPOFOL N/A 04/13/2018   Procedure: COLONOSCOPY WITH PROPOFOL;  Surgeon: Manya Silvas, MD;  Location: [facility] ENDOSCOPY;  Service: Endoscopy;  Laterality: N/A;  . ESOPHAGOGASTRODUODENOSCOPY (EGD) WITH PROPOFOL N/A 02/23/2015   Procedure: ESOPHAGOGASTRODUODENOSCOPY (EGD) WITH PROPOFOL;  Surgeon: Manya Silvas, MD;  Location: [facility] ENDOSCOPY;  Service: Endoscopy;  Laterality: N/A;  . ESOPHAGOGASTRODUODENOSCOPY (EGD) WITH PROPOFOL N/A 04/13/2018   Procedure: ESOPHAGOGASTRODUODENOSCOPY (EGD) WITH PROPOFOL;  Surgeon: Manya Silvas, MD;  Location: [facility] ENDOSCOPY;  Service: Endoscopy;  Laterality: N/A;  . HIP CLOSED REDUCTION Left 08/08/2015   Procedure: CLOSED REDUCTION HIP;  Surgeon: Dereck Leep, MD;  Location: [facility] ORS;  Service: Orthopedics;  Laterality: Left;  . HIP CLOSED REDUCTION Left  09/18/2015   Procedure: CLOSED REDUCTION HIP;  Surgeon: Hessie Knows, MD;  Location: [facility] ORS;  Service: Orthopedics;  Laterality: Left;  . HIP CLOSED REDUCTION Left 12/01/2015   Procedure: CLOSED REDUCTION HIP;  Surgeon: Dereck Leep, MD;  Location: [facility] ORS;  Service: Orthopedics;  Laterality: Left;  no prep no incision  . JOINT REPLACEMENT  10/2016   left knee  . KNEE ARTHROPLASTY Left 10/28/2016   Procedure: COMPUTER ASSISTED TOTAL KNEE ARTHROPLASTY;  Surgeon: Dereck Leep, MD;  Location: [facility] ORS;  Service: Orthopedics;  Laterality: Left;  . KNEE ARTHROPLASTY Right 07/07/2017   Procedure: COMPUTER ASSISTED TOTAL KNEE ARTHROPLASTY;  Surgeon: Dereck Leep, MD;  Location: [facility] ORS;  Service: Orthopedics;  Laterality: Right;  . KNEE ARTHROSCOPY Right 11/14/2014   Procedure: ARTHROSCOPY KNEE;  Surgeon: Dereck Leep, MD;  Location: [facility] ORS;  Service: Orthopedics;  Laterality: Right;  Posterior horn menicus, anterior lateral menicus grade 3 chondromalacia  . phlebitis groin    . right foot surgery    . SHOULDER ARTHROSCOPY WITH OPEN ROTATOR CUFF REPAIR Left 03/10/2019   Procedure: LEFT SHOULDER ARTHROSCOPY, ARTHROSCOPIC ROTATOR CUFF REPAIR, DISTAL CLAVICLE EXCISION, SUBACROMIAL DECOMPRESSION, INTRA-ARTICULAR DEBRIDEMENT ;  Surgeon: Lovell Sheehan, MD;  Location: [facility] ORS;  Service: Orthopedics;  Laterality: Left;  . SHOULDER ARTHROSCOPY WITH ROTATOR CUFF REPAIR Left 06/21/2019   Procedure: SHOULDER ARTHROSCOPY WITH EXTENSIVE INTRAARTICULAR DEBRIDEMENT WITH REMOVAL OF HARDWARE;  Surgeon: Kurtis Bushman  R, MD;  Location: [facility];  Service: Orthopedics;  Laterality: Left;  . TONSILLECTOMY    . TOTAL HIP ARTHROPLASTY Left 05/08/11   necrosis of hip  . TOTAL HIP ARTHROPLASTY Right 06/14/2015   Procedure: TOTAL HIP ARTHROPLASTY;  Surgeon: Dereck Leep, MD;  Location: [facility] ORS;  Service: Orthopedics;  Laterality: Right;  . TOTAL HIP REVISION Left 02/21/2016   Procedure: TOTAL HIP  REVISION;  Surgeon: Dereck Leep, MD;  Location: [facility] ORS;  Service: Orthopedics;  Laterality: Left;    There were no vitals filed for this visit.   Subjective Assessment - 07/27/20 1509    Subjective Patient reports she is feeling good on today's session. Increased soreness along the ankles after having to walk for a prolonged period of time.    Pertinent History PMH: B TKA, B THA    Limitations Walking;Standing    How long can you sit comfortably? unlimited    How long can you stand comfortably? 10 min    How long can you walk comfortably? unknown    Patient Stated Goals Return to regular activities    Currently in Pain? Yes    Pain Score 2     Pain Location Foot    Pain Orientation Right;Left    Pain Descriptors / Indicators Aching    Pain Type Chronic pain    Pain Onset More than a month ago    Pain Frequency Intermittent                 TREATMENT   Therapeutic Exercise   Pre gait dynamic with a step forward/backward - x 13 B   Arch Lifts in sitting - x 20   Piano toes in sitting - x 20   PROM toe extension with  -- x 20 with 10 sec holds   Toe abduction in sitting  -- x 20   Ambulation with focus on speed and heel strike - x 261ft   Ambulation with focus on taking steps over 6 hurdles -  2x 2             Performed exercises to address pain and spasms        PT Education - 07/27/20 1520    Education Details form/technique with exercises    Person(s) Educated Patient    Methods Explanation;Demonstration    Comprehension Returned demonstration;Verbalized understanding            PT Short Term Goals - 07/13/20 1941      PT SHORT TERM GOAL #1   Title Patient will be independent with HEP to continue benefits of therapy after discharge    Baseline dependent    Time 4    Period Weeks    Status New             PT Long Term Goals - 07/13/20 1941      PT LONG TERM GOAL #1   Title Patient will be able to walk > 30 min without increase in  pain to indicate improvement in ankle funciton and return to aerobic exercise    Baseline <5 min    Time 8    Period Weeks    Status New    Target Date 09/07/20      PT LONG TERM GOAL #2   Title Patient will demonstrate significant improvement in FOTO score to indicate significant improvement in ankle functioning    Time 8    Period Weeks    Status New  PT LONG TERM GOAL #3   Title Patient will improve 64mwt to over 1 m/s with LRAD to better perform community amb with less fall risk.    Baseline .57 m/s    Time 8    Period Weeks    Status New    Target Date 09/08/19                 Plan - 07/28/20 1308    Clinical Impression Statement Increased sireness today from performing prolonged walking the prtevious day thus performed greater balance and intrinsic foot strengthening. Patient tolerates well with no increase in pain; continues to have limited inversion on the affected R side, however this is improved after focus on performing motor control exercises indicating poor coordination. Patient continues to have limitations with performing heel lifts, indicating poor gastroc strength. This however is improving. Patient will benefit from further skilled therapy to return to prior level of function.    Personal Factors and Comorbidities Comorbidity 3+    Comorbidities obesity, B TKAs, B THAs    Examination-Activity Limitations Lift;Squat;Stairs    Examination-Participation Restrictions Church;Community Activity;Cleaning    Stability/Clinical Decision Making Stable/Uncomplicated    Rehab Potential Good    PT Frequency 2x / week    PT Duration 8 weeks    PT Treatment/Interventions Electrical Stimulation;Cryotherapy;Moist Heat;Functional mobility training;Therapeutic activities;Therapeutic exercise;Gait training;Neuromuscular re-education;Manual techniques;Dry needling;Passive range of motion;Patient/family education;Joint Manipulations;Spinal Manipulations    PT Next Visit Plan  Improve ankle AROM and LE strength    PT Home Exercise Plan See education section    Consulted and Agree with Plan of Care Patient           Patient will benefit from skilled therapeutic intervention in order to improve the following deficits and impairments:  Abnormal gait,Pain,Decreased coordination,Decreased mobility,Decreased scar mobility,Increased muscle spasms,Postural dysfunction,Decreased endurance,Decreased activity tolerance,Decreased range of motion,Decreased strength,Hypomobility,Decreased balance,Difficulty walking  Visit Diagnosis: Pain in left foot  Pain in right foot  Stiffness of right foot, not elsewhere classified  Stiffness of left foot, not elsewhere classified     Problem List Patient Active Problem List   Diagnosis Date Noted  . Generalized osteoarthritis of multiple sites 07/28/2018  . Varicose veins of both legs with edema 07/28/2018  . FH: colon polyps 02/10/2018  . S/P total knee arthroplasty 07/07/2017  . Status post total left knee replacement 10/28/2016  . Failed total hip arthroplasty with dislocation ([place]) 12/01/2015  . S/P closed reduction of dislocated total hip prosthesis 08/08/2015  . S/P total hip arthroplasty 06/14/2015  . Benign essential HTN 12/28/2013  . Acid reflux 12/28/2013  . HLD (hyperlipidemia) 12/28/2013  . Restless leg 12/28/2013    Blythe Stanford, PT DPT 07/28/2020, 1:11 PM  [place] [facility] [address] [place], [address] Phone: [phone]   Fax:  [phone]  Name: Claudia Terry MRN: [id number] Date of Birth: 21-Nov-1945

## 2020-08-01 DIAGNOSIS — H2512 Age-related nuclear cataract, left eye: Secondary | ICD-10-CM | POA: Diagnosis not present

## 2020-08-01 DIAGNOSIS — I10 Essential (primary) hypertension: Secondary | ICD-10-CM | POA: Diagnosis not present

## 2020-08-03 ENCOUNTER — Other Ambulatory Visit: Payer: Self-pay

## 2020-08-03 ENCOUNTER — Ambulatory Visit: Payer: PPO | Admitting: Dermatology

## 2020-08-03 ENCOUNTER — Ambulatory Visit: Payer: PPO

## 2020-08-03 DIAGNOSIS — L821 Other seborrheic keratosis: Secondary | ICD-10-CM

## 2020-08-03 DIAGNOSIS — B079 Viral wart, unspecified: Secondary | ICD-10-CM

## 2020-08-03 DIAGNOSIS — M79672 Pain in left foot: Secondary | ICD-10-CM

## 2020-08-03 DIAGNOSIS — M79671 Pain in right foot: Secondary | ICD-10-CM | POA: Diagnosis not present

## 2020-08-03 DIAGNOSIS — M25674 Stiffness of right foot, not elsewhere classified: Secondary | ICD-10-CM

## 2020-08-03 NOTE — Patient Instructions (Addendum)
Cryotherapy Aftercare  . Wash gently with soap and water everyday.   . Apply Vaseline and Band-Aid daily until healed.  

## 2020-08-03 NOTE — Progress Notes (Addendum)
   Follow-Up Visit   Subjective  Claudia Terry is a 75 y.o. female who presents for the following: Follow-up (Patient is here today for concerns about spot on left side scalp/temple. Patient had a spot on left side of nose removed back in December with biopsy showing SK. Spot on right side back she wants [place] to check today).   The following portions of the chart were reviewed this encounter and updated as appropriate:  Tobacco  Allergies  Meds  Problems  Med Hx  Surg Hx  Fam Hx       Objective  Well appearing patient in no apparent distress; mood and affect are within normal limits.  A focused examination was performed including right frontal scalp and right lower back . Relevant physical exam findings are noted in the Assessment and Plan.  Objective  left frontal scalp: Verrucous papule within waxy papule     Assessment & Plan  Verruca left frontal scalp  Verruca within seborrheic keratosis  Prior to procedure, discussed risks of blister formation, small wound, skin dyspigmentation, or rare scar following cryotherapy.    Destruction of lesion - left frontal scalp  Destruction method: cryotherapy   Informed consent: discussed and consent obtained   Lesion destroyed using liquid nitrogen: Yes   Cryotherapy cycles:  2 Outcome: patient tolerated procedure well with no complications   Post-procedure details: wound care instructions given     Seborrheic Keratoses Right lower back - Stuck-on, waxy, tan-brown papules and plaques  - Discussed benign etiology and prognosis. - Observe - Call for any changes   Return in about 3 weeks (around 08/24/2020), or follow up on verruca on right frontal scalp.  I, Ruthell Rummage, CMA, am acting as scribe for Forest Gleason, MD.  Documentation: I have reviewed the above documentation for accuracy and completeness, and I agree with the above.  Forest Gleason, MD

## 2020-08-03 NOTE — Therapy (Signed)
[place] [facility] [address], [place], [address] Phone: [phone]   Fax:  [phone]  Physical Therapy Treatment  Patient Details  Name: Claudia Terry MRN: [id number] Date of Birth: 1946/03/18 Referring Provider (PT): babaoff MD   Encounter Date: 08/03/2020   PT End of Session - 08/03/20 1402    Visit Number 6    Number of Visits 17    Date for PT Re-Evaluation 09/06/20    PT Start Time 5631    PT Stop Time 1430    PT Time Calculation (min) 45 min    Activity Tolerance Patient tolerated treatment well    Behavior During Therapy [facility] for tasks assessed/performed           Past Medical History:  Diagnosis Date  . Arthritis   . 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 GERD (gastroesophageal reflux disease) 8/01    Dr. Tiffany Kocher  . Hypertension   . Low back pain 11/95  . Menopause   . Osteoarthritis   . Restless leg syndrome 1999    Past Surgical History:  Procedure Laterality Date  . [place]   X2  . COLONOSCOPY  12/06/11   normal  . COLONOSCOPY WITH PROPOFOL N/A 04/13/2018   Procedure: COLONOSCOPY WITH PROPOFOL;  Surgeon: Manya Silvas, MD;  Location: [facility] ENDOSCOPY;  Service: Endoscopy;  Laterality: N/A;  . ESOPHAGOGASTRODUODENOSCOPY (EGD) WITH PROPOFOL N/A 02/23/2015   Procedure: ESOPHAGOGASTRODUODENOSCOPY (EGD) WITH PROPOFOL;  Surgeon: Manya Silvas, MD;  Location[facility] ENDOSCOPY;  Service: Endoscopy;  Laterality: N/A;  . ESOPHAGOGASTRODUODENOSCOPY (EGD) WITH PROPOFOL N/A 04/13/2018   Procedure: ESOPHAGOGASTRODUODENOSCOPY (EGD) WITH PROPOFOL;  Surgeon: Manya Silvas, MD;  Location: [facility] ENDOSCOPY;  Service: Endoscopy;  Laterality: N/A;  . HIP CLOSED REDUCTION Left 08/08/2015   Procedure: CLOSED REDUCTION HIP;  Surgeon: Dereck Leep, MD;  Location: [facility] ORS;  Service: Orthopedics;  Laterality: Left;  . HIP CLOSED REDUCTION Left  09/18/2015   Procedure: CLOSED REDUCTION HIP;  Surgeon: Hessie Knows, MD;  Location: [facility] ORS;  Service: Orthopedics;  Laterality: Left;  . HIP CLOSED REDUCTION Left 12/01/2015   Procedure: CLOSED REDUCTION HIP;  Surgeon: Dereck Leep, MD;  Location: [facility] ORS;  Service: Orthopedics;  Laterality: Left;  no prep no incision  . JOINT REPLACEMENT  10/2016   left knee  . KNEE ARTHROPLASTY Left 10/28/2016   Procedure: COMPUTER ASSISTED TOTAL KNEE ARTHROPLASTY;  Surgeon: Dereck Leep, MD;  Location: [facility] ORS;  Service: Orthopedics;  Laterality: Left;  . KNEE ARTHROPLASTY Right 07/07/2017   Procedure: COMPUTER ASSISTED TOTAL KNEE ARTHROPLASTY;  Surgeon: Dereck Leep, MD;  Location: [facility] ORS;  Service: Orthopedics;  Laterality: Right;  . KNEE ARTHROSCOPY Right 11/14/2014   Procedure: ARTHROSCOPY KNEE;  Surgeon: Dereck Leep, MD;  Location: [facility] ORS;  Service: Orthopedics;  Laterality: Right;  Posterior horn menicus, anterior lateral menicus grade 3 chondromalacia  . phlebitis groin    . right foot surgery    . SHOULDER ARTHROSCOPY WITH OPEN ROTATOR CUFF REPAIR Left 03/10/2019   Procedure: LEFT SHOULDER ARTHROSCOPY, ARTHROSCOPIC ROTATOR CUFF REPAIR, DISTAL CLAVICLE EXCISION, SUBACROMIAL DECOMPRESSION, INTRA-ARTICULAR DEBRIDEMENT ;  Surgeon: Lovell Sheehan, MD;  Location: [facility] ORS;  Service: Orthopedics;  Laterality: Left;  . SHOULDER ARTHROSCOPY WITH ROTATOR CUFF REPAIR Left 06/21/2019   Procedure: SHOULDER ARTHROSCOPY WITH EXTENSIVE INTRAARTICULAR DEBRIDEMENT WITH REMOVAL OF HARDWARE;  Surgeon: Kurtis Bushman  R, MD;  Location: [place];  Service: Orthopedics;  Laterality: Left;  . TONSILLECTOMY    . TOTAL HIP ARTHROPLASTY Left 05/08/11   necrosis of hip  . TOTAL HIP ARTHROPLASTY Right 06/14/2015   Procedure: TOTAL HIP ARTHROPLASTY;  Surgeon: Dereck Leep, MD;  Location: [facility] ORS;  Service: Orthopedics;  Laterality: Right;  . TOTAL HIP REVISION Left 02/21/2016   Procedure: TOTAL HIP  REVISION;  Surgeon: Dereck Leep, MD;  Location: [facility] ORS;  Service: Orthopedics;  Laterality: Left;    There were no vitals filed for this visit.   Subjective Assessment - 08/03/20 1358    Subjective Patient states she has been performing exercises and states she has been having increased difficulty with walking but overall is improving.    Pertinent History PMH: B TKA, B THA    Limitations Walking;Standing    How long can you sit comfortably? unlimited    How long can you stand comfortably? 10 min    How long can you walk comfortably? unknown    Patient Stated Goals Return to regular activities    Pain Onset More than a month ago            TREATMENT    Therapeutic Exercise  Piano toes in sitting - x 20  Windshield wipers in sitting - x20 Pre gait dynamic with a step forward/backward - x 13 B  Arch Lifts in sitting - x 20  Airex pad marches in standing x 20, x10 without UE support Tandem stance on airex pad - 2 x 1 min B Total gym heel raises single leg - x  Ambulation with focus on speed and heel strike - x 268ft  Heel raises on [employer] level 14 - 2 x10  Performed exercises in standing        PT Education - 08/03/20 1402    Education Details form/technique with exercise    Person(s) Educated Patient    Methods Explanation;Demonstration    Comprehension Returned demonstration;Verbalized understanding            PT Short Term Goals - 07/13/20 1941      PT SHORT TERM GOAL #1   Title Patient will be independent with HEP to continue benefits of therapy after discharge    Baseline dependent    Time 4    Period Weeks    Status New             PT Long Term Goals - 07/13/20 1941      PT LONG TERM GOAL #1   Title Patient will be able to walk > 30 min without increase in pain to indicate improvement in ankle funciton and return to aerobic exercise    Baseline <5 min    Time 8    Period Weeks    Status New    Target Date 09/07/20      PT LONG TERM  GOAL #2   Title Patient will demonstrate significant improvement in FOTO score to indicate significant improvement in ankle functioning    Time 8    Period Weeks    Status New      PT LONG TERM GOAL #3   Title Patient will improve 45mwt to over 1 m/s with LRAD to better perform community amb with less fall risk.    Baseline .57 m/s    Time 8    Period Weeks    Status New    Target Date 09/08/19  Plan - 08/03/20 1405    Clinical Impression Statement Continued to focus on improving foot intrinsic musculature during today's session. Patient is demonstrating improvement with narrow BOS indicating functional carryover between sessions. Patient continues to have decreased gastroc strength, although this is improving. Patient will benefit from further skilled therapy focused on improving stregnth to return to prior level of function.    Personal Factors and Comorbidities Comorbidity 3+    Comorbidities obesity, B TKAs, B THAs    Examination-Activity Limitations Lift;Squat;Stairs    Examination-Participation Restrictions Church;Community Activity;Cleaning    Stability/Clinical Decision Making Stable/Uncomplicated    Rehab Potential Good    PT Frequency 2x / week    PT Duration 8 weeks    PT Treatment/Interventions Electrical Stimulation;Cryotherapy;Moist Heat;Functional mobility training;Therapeutic activities;Therapeutic exercise;Gait training;Neuromuscular re-education;Manual techniques;Dry needling;Passive range of motion;Patient/family education;Joint Manipulations;Spinal Manipulations    PT Next Visit Plan Improve ankle AROM and LE strength    PT Home Exercise Plan See education section    Consulted and Agree with Plan of Care Patient           Patient will benefit from skilled therapeutic intervention in order to improve the following deficits and impairments:  Abnormal gait,Pain,Decreased coordination,Decreased mobility,Decreased scar mobility,Increased muscle  spasms,Postural dysfunction,Decreased endurance,Decreased activity tolerance,Decreased range of motion,Decreased strength,Hypomobility,Decreased balance,Difficulty walking  Visit Diagnosis: Pain in left foot  Pain in right foot  Stiffness of right foot, not elsewhere classified     Problem List Patient Active Problem List   Diagnosis Date Noted  . Generalized osteoarthritis of multiple sites 07/28/2018  . Varicose veins of both legs with edema 07/28/2018  . FH: colon polyps 02/10/2018  . S/P total knee arthroplasty 07/07/2017  . Status post total left knee replacement 10/28/2016  . Failed total hip arthroplasty with dislocation ([place]) 12/01/2015  . S/P closed reduction of dislocated total hip prosthesis 08/08/2015  . S/P total hip arthroplasty 06/14/2015  . Benign essential HTN 12/28/2013  . Acid reflux 12/28/2013  . HLD (hyperlipidemia) 12/28/2013  . Restless leg 12/28/2013    Blythe Stanford 08/03/2020, 2:20 PM  [place] [facility] [address] [place], [address] Phone: [phone]   Fax:  [phone]  Name: Claudia Terry MRN: [id number] Date of Birth: 02-Mar-1946

## 2020-08-07 ENCOUNTER — Encounter: Payer: Self-pay | Admitting: Dermatology

## 2020-08-07 ENCOUNTER — Ambulatory Visit: Payer: PPO

## 2020-08-07 ENCOUNTER — Other Ambulatory Visit: Payer: Self-pay

## 2020-08-07 DIAGNOSIS — M25674 Stiffness of right foot, not elsewhere classified: Secondary | ICD-10-CM

## 2020-08-07 DIAGNOSIS — M79671 Pain in right foot: Secondary | ICD-10-CM | POA: Diagnosis not present

## 2020-08-07 DIAGNOSIS — M79672 Pain in left foot: Secondary | ICD-10-CM

## 2020-08-07 NOTE — Therapy (Signed)
[place] [facility] [address] [place], [address] Phone: [phone]   Fax:  [phone]  Physical Therapy Treatment  Patient Details  Name: Claudia Terry MRN: [id number] Date of Birth: [birth year] Referring Provider (PT): babaoff MD   Encounter Date: 08/07/2020   PT End of Session - 08/07/20 1359    Visit Number 7    Number of Visits 17    Date for PT Re-Evaluation 09/06/20    PT Start Time 6962    PT Stop Time 1430    PT Time Calculation (min) 45 min    Activity Tolerance Patient tolerated treatment well    Behavior During Therapy [facility] for tasks assessed/performed           Past Medical History:  Diagnosis Date  . Arthritis   . 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 GERD (gastroesophageal reflux disease) 8/01    Dr. Tiffany Kocher  . Hypertension   . Low back pain 11/95  . Menopause   . Osteoarthritis   . Restless leg syndrome 1999    Past Surgical History:  Procedure Laterality Date  . [place]   X2  . COLONOSCOPY  12/06/11   normal  . COLONOSCOPY WITH PROPOFOL N/A 04/13/2018   Procedure: COLONOSCOPY WITH PROPOFOL;  Surgeon: Manya Silvas, MD;  Location: [facility] ENDOSCOPY;  Service: Endoscopy;  Laterality: N/A;  . ESOPHAGOGASTRODUODENOSCOPY (EGD) WITH PROPOFOL N/A 02/23/2015   Procedure: ESOPHAGOGASTRODUODENOSCOPY (EGD) WITH PROPOFOL;  Surgeon: Manya Silvas, MD;  Location: [facility] ENDOSCOPY;  Service: Endoscopy;  Laterality: N/A;  . ESOPHAGOGASTRODUODENOSCOPY (EGD) WITH PROPOFOL N/A 04/13/2018   Procedure: ESOPHAGOGASTRODUODENOSCOPY (EGD) WITH PROPOFOL;  Surgeon: Manya Silvas, MD;  Location: [facility] ENDOSCOPY;  Service: Endoscopy;  Laterality: N/A;  . HIP CLOSED REDUCTION Left 08/08/2015   Procedure: CLOSED REDUCTION HIP;  Surgeon: Dereck Leep, MD;  Location: [facility] ORS;  Service: Orthopedics;  Laterality: Left;  . HIP CLOSED REDUCTION Left  09/18/2015   Procedure: CLOSED REDUCTION HIP;  Surgeon: Hessie Knows, MD;  Location: [facility] ORS;  Service: Orthopedics;  Laterality: Left;  . HIP CLOSED REDUCTION Left 12/01/2015   Procedure: CLOSED REDUCTION HIP;  Surgeon: Dereck Leep, MD;  Location: [facility] ORS;  Service: Orthopedics;  Laterality: Left;  no prep no incision  . JOINT REPLACEMENT  10/2016   left knee  . KNEE ARTHROPLASTY Left 10/28/2016   Procedure: COMPUTER ASSISTED TOTAL KNEE ARTHROPLASTY;  Surgeon: Dereck Leep, MD;  Location: [facility] ORS;  Service: Orthopedics;  Laterality: Left;  . KNEE ARTHROPLASTY Right 07/07/2017   Procedure: COMPUTER ASSISTED TOTAL KNEE ARTHROPLASTY;  Surgeon: Dereck Leep, MD;  Location: [facility] ORS;  Service: Orthopedics;  Laterality: Right;  . KNEE ARTHROSCOPY Right 11/14/2014   Procedure: ARTHROSCOPY KNEE;  Surgeon: Dereck Leep, MD;  Location: [facility] ORS;  Service: Orthopedics;  Laterality: Right;  Posterior horn menicus, anterior lateral menicus grade 3 chondromalacia  . phlebitis groin    . right foot surgery    . SHOULDER ARTHROSCOPY WITH OPEN ROTATOR CUFF REPAIR Left 03/10/2019   Procedure: LEFT SHOULDER ARTHROSCOPY, ARTHROSCOPIC ROTATOR CUFF REPAIR, DISTAL CLAVICLE EXCISION, SUBACROMIAL DECOMPRESSION, INTRA-ARTICULAR DEBRIDEMENT ;  Surgeon: Lovell Sheehan, MD;  Location: [facility] ORS;  Service: Orthopedics;  Laterality: Left;  . SHOULDER ARTHROSCOPY WITH ROTATOR CUFF REPAIR Left 06/21/2019   Procedure: SHOULDER ARTHROSCOPY WITH EXTENSIVE INTRAARTICULAR DEBRIDEMENT WITH REMOVAL OF HARDWARE;  Surgeon: Kurtis Bushman  R, MD;  Location: [place];  Service: Orthopedics;  Laterality: Left;  . TONSILLECTOMY    . TOTAL HIP ARTHROPLASTY Left 05/08/11   necrosis of hip  . TOTAL HIP ARTHROPLASTY Right 06/14/2015   Procedure: TOTAL HIP ARTHROPLASTY;  Surgeon: Dereck Leep, MD;  Location: [facility] ORS;  Service: Orthopedics;  Laterality: Right;  . TOTAL HIP REVISION Left 02/21/2016   Procedure: TOTAL HIP  REVISION;  Surgeon: Dereck Leep, MD;  Location: [facility] ORS;  Service: Orthopedics;  Laterality: Left;    There were no vitals filed for this visit.   Subjective Assessment - 08/07/20 1347    Subjective Patient states was walking this weekend. Patient states she was searching for shoes to fit her feet which has been difficult.    Pertinent History PMH: B TKA, B THA    Limitations Walking;Standing    How long can you sit comfortably? unlimited    How long can you stand comfortably? 10 min    How long can you walk comfortably? unknown    Patient Stated Goals Return to regular activities    Currently in Pain? No/denies    Pain Onset More than a month ago              TREATMENT Therapeutic Exercise Heel raises in standing with UE support with UE support - 3 x 10  Resisted plantarflexion in sitting with manual resistance - 2 x 7 Ambulation on flat surface with focus on neutral foot position - x 150 ft Pre-Gait with focus on performing plantarflexion with R foot forward - 2 x 15 Ankle inversion/eversion with foot positioned in plantarflexion - x 20  Total gym level 15 , 16, 17 level - x 15 with patient positioned in supine Performed exercises to improve strengthening    PT Education - 08/07/20 1359    Education Details form/technique with exercise    Person(s) Educated Patient    Methods Explanation;Demonstration    Comprehension Verbalized understanding;Returned demonstration            PT Short Term Goals - 07/13/20 1941      PT SHORT TERM GOAL #1   Title Patient will be independent with HEP to continue benefits of therapy after discharge    Baseline dependent    Time 4    Period Weeks    Status New             PT Long Term Goals - 07/13/20 1941      PT LONG TERM GOAL #1   Title Patient will be able to walk > 30 min without increase in pain to indicate improvement in ankle funciton and return to aerobic exercise    Baseline <5 min    Time 8    Period Weeks     Status New    Target Date 09/07/20      PT LONG TERM GOAL #2   Title Patient will demonstrate significant improvement in FOTO score to indicate significant improvement in ankle functioning    Time 8    Period Weeks    Status New      PT LONG TERM GOAL #3   Title Patient will improve 84mwt to over 1 m/s with LRAD to better perform community amb with less fall risk.    Baseline .57 m/s    Time 8    Period Weeks    Status New    Target Date 09/08/19  Plan - 08/07/20 1418    Clinical Impression Statement Performed exercises to address gastrocnemius and soleus strength with patient postioned in standing to improve push off aspect of gait. Patient current utilizes foot invertors to progress foot on the R side which is limiting proper foot progressing during therapy. Patent is making progress with ankle strength. Patient will benefit from further skilled therapy to return to prior level of function.    Personal Factors and Comorbidities Comorbidity 3+    Comorbidities obesity, B TKAs, B THAs    Examination-Activity Limitations Lift;Squat;Stairs    Examination-Participation Restrictions Church;Community Activity;Cleaning    Stability/Clinical Decision Making Stable/Uncomplicated    Rehab Potential Good    PT Frequency 2x / week    PT Duration 8 weeks    PT Treatment/Interventions Electrical Stimulation;Cryotherapy;Moist Heat;Functional mobility training;Therapeutic activities;Therapeutic exercise;Gait training;Neuromuscular re-education;Manual techniques;Dry needling;Passive range of motion;Patient/family education;Joint Manipulations;Spinal Manipulations    PT Next Visit Plan Improve ankle AROM and LE strength    PT Home Exercise Plan See education section    Consulted and Agree with Plan of Care Patient           Patient will benefit from skilled therapeutic intervention in order to improve the following deficits and impairments:  Abnormal gait,Pain,Decreased  coordination,Decreased mobility,Decreased scar mobility,Increased muscle spasms,Postural dysfunction,Decreased endurance,Decreased activity tolerance,Decreased range of motion,Decreased strength,Hypomobility,Decreased balance,Difficulty walking  Visit Diagnosis: Pain in left foot  Pain in right foot  Stiffness of right foot, not elsewhere classified     Problem List Patient Active Problem List   Diagnosis Date Noted  . Generalized osteoarthritis of multiple sites 07/28/2018  . Varicose veins of both legs with edema 07/28/2018  . FH: colon polyps 02/10/2018  . S/P total knee arthroplasty 07/07/2017  . Status post total left knee replacement 10/28/2016  . Failed total hip arthroplasty with dislocation ([place]) 12/01/2015  . S/P closed reduction of dislocated total hip prosthesis 08/08/2015  . S/P total hip arthroplasty 06/14/2015  . Benign essential HTN 12/28/2013  . Acid reflux 12/28/2013  . HLD (hyperlipidemia) 12/28/2013  . Restless leg 12/28/2013    Blythe Stanford, PT DPT 08/07/2020, 2:33 PM  [place] [facility] [address], [place], [address] Phone: [phone]   Fax:  [phone]  Name: Claudia Terry MRN: [id number] Date of Birth: 1946-05-31

## 2020-08-08 ENCOUNTER — Other Ambulatory Visit: Payer: Self-pay

## 2020-08-08 ENCOUNTER — Encounter: Payer: Self-pay | Admitting: Ophthalmology

## 2020-08-10 ENCOUNTER — Ambulatory Visit: Payer: PPO

## 2020-08-11 ENCOUNTER — Other Ambulatory Visit
Admission: RE | Admit: 2020-08-11 | Discharge: 2020-08-11 | Disposition: A | Discharge status: Home / Self Care | Payer: PPO | Source: Ambulatory Visit | Attending: Ophthalmology | Admitting: Ophthalmology

## 2020-08-11 ENCOUNTER — Other Ambulatory Visit: Payer: Self-pay

## 2020-08-11 DIAGNOSIS — Z01812 Encounter for preprocedural laboratory examination: Secondary | ICD-10-CM | POA: Diagnosis not present

## 2020-08-11 DIAGNOSIS — Z20822 Contact with and (suspected) exposure to covid-19: Secondary | ICD-10-CM | POA: Diagnosis not present

## 2020-08-11 NOTE — Discharge Instructions (Signed)

## 2020-08-12 LAB — SARS CORONAVIRUS 2 (TAT 6-24 HRS): SARS Coronavirus 2: NEGATIVE

## 2020-08-15 ENCOUNTER — Encounter
Admission: RE | Disposition: A | Discharge status: Home / Self Care | Payer: Self-pay | Source: Home / Self Care | Attending: Ophthalmology

## 2020-08-15 ENCOUNTER — Other Ambulatory Visit: Payer: Self-pay

## 2020-08-15 ENCOUNTER — Encounter: Payer: Self-pay | Admitting: Ophthalmology

## 2020-08-15 ENCOUNTER — Ambulatory Visit
Admission: RE | Admit: 2020-08-15 | Discharge: 2020-08-15 | Disposition: A | Discharge status: Home / Self Care | Payer: PPO | Attending: Ophthalmology | Admitting: Ophthalmology

## 2020-08-15 ENCOUNTER — Ambulatory Visit: Payer: PPO | Admitting: Anesthesiology

## 2020-08-15 DIAGNOSIS — Z8041 Family history of malignant neoplasm of ovary: Secondary | ICD-10-CM | POA: Diagnosis not present

## 2020-08-15 DIAGNOSIS — Z96652 Presence of left artificial knee joint: Secondary | ICD-10-CM | POA: Insufficient documentation

## 2020-08-15 DIAGNOSIS — Z96643 Presence of artificial hip joint, bilateral: Secondary | ICD-10-CM | POA: Insufficient documentation

## 2020-08-15 DIAGNOSIS — Z888 Allergy status to other drugs, medicaments and biological substances status: Secondary | ICD-10-CM | POA: Diagnosis not present

## 2020-08-15 DIAGNOSIS — H25812 Combined forms of age-related cataract, left eye: Secondary | ICD-10-CM | POA: Diagnosis not present

## 2020-08-15 DIAGNOSIS — Z79899 Other long term (current) drug therapy: Secondary | ICD-10-CM | POA: Insufficient documentation

## 2020-08-15 DIAGNOSIS — Z8249 Family history of ischemic heart disease and other diseases of the circulatory system: Secondary | ICD-10-CM | POA: Diagnosis not present

## 2020-08-15 DIAGNOSIS — Z8261 Family history of arthritis: Secondary | ICD-10-CM | POA: Insufficient documentation

## 2020-08-15 DIAGNOSIS — Z801 Family history of malignant neoplasm of trachea, bronchus and lung: Secondary | ICD-10-CM | POA: Diagnosis not present

## 2020-08-15 DIAGNOSIS — Z880 Allergy status to penicillin: Secondary | ICD-10-CM | POA: Insufficient documentation

## 2020-08-15 DIAGNOSIS — H2512 Age-related nuclear cataract, left eye: Secondary | ICD-10-CM | POA: Diagnosis not present

## 2020-08-15 DIAGNOSIS — Z881 Allergy status to other antibiotic agents status: Secondary | ICD-10-CM | POA: Diagnosis not present

## 2020-08-15 DIAGNOSIS — Z833 Family history of diabetes mellitus: Secondary | ICD-10-CM | POA: Insufficient documentation

## 2020-08-15 HISTORY — PX: CATARACT EXTRACTION W/PHACO: SHX586

## 2020-08-15 SURGERY — PHACOEMULSIFICATION, CATARACT, WITH IOL INSERTION
Anesthesia: Monitor Anesthesia Care | Site: Eye | Laterality: Left

## 2020-08-15 MED ORDER — BRIMONIDINE TARTRATE-TIMOLOL 0.2-0.5 % OP SOLN
OPHTHALMIC | Status: DC | PRN
  Administered 2020-08-15: 1 [drp] via OPHTHALMIC

## 2020-08-15 MED ORDER — LIDOCAINE HCL (PF) 2 % IJ SOLN
INTRAOCULAR | Status: DC | PRN
  Administered 2020-08-15: 2 mL

## 2020-08-15 MED ORDER — MOXIFLOXACIN HCL 0.5 % OP SOLN
OPHTHALMIC | Status: DC | PRN
  Administered 2020-08-15: 0.2 mL via OPHTHALMIC

## 2020-08-15 MED ORDER — TETRACAINE HCL 0.5 % OP SOLN
1.0000 [drp] | OPHTHALMIC | Status: DC | PRN
  Administered 2020-08-15 (×3): 1 [drp] via OPHTHALMIC

## 2020-08-15 MED ORDER — PHENYLEPHRINE HCL 10 % OP SOLN
1.0000 [drp] | OPHTHALMIC | Status: DC | PRN
  Administered 2020-08-15 (×3): 1 [drp] via OPHTHALMIC

## 2020-08-15 MED ORDER — MIDAZOLAM HCL 2 MG/2ML IJ SOLN
INTRAMUSCULAR | Status: DC | PRN
  Administered 2020-08-15 (×2): 1 mg via INTRAVENOUS

## 2020-08-15 MED ORDER — NA CHONDROIT SULF-NA HYALURON 40-17 MG/ML IO SOLN
INTRAOCULAR | Status: DC | PRN
  Administered 2020-08-15: 1 mL via INTRAOCULAR

## 2020-08-15 MED ORDER — CYCLOPENTOLATE HCL 2 % OP SOLN
1.0000 [drp] | OPHTHALMIC | Status: DC | PRN
  Administered 2020-08-15 (×3): 1 [drp] via OPHTHALMIC

## 2020-08-15 MED ORDER — EPINEPHRINE PF 1 MG/ML IJ SOLN
INTRAOCULAR | Status: DC | PRN
  Administered 2020-08-15: 65 mL via OPHTHALMIC

## 2020-08-15 MED ORDER — FENTANYL CITRATE (PF) 100 MCG/2ML IJ SOLN
INTRAMUSCULAR | Status: DC | PRN
  Administered 2020-08-15 (×2): 50 ug via INTRAVENOUS

## 2020-08-15 SURGICAL SUPPLY — 19 items
CANNULA ANT/CHMB 27G (MISCELLANEOUS) ×2 IMPLANT
CANNULA ANT/CHMB 27GA (MISCELLANEOUS) ×4 IMPLANT
GLOVE SURG LX 8.0 MICRO (GLOVE) ×1
GLOVE SURG LX STRL 8.0 MICRO (GLOVE) ×1 IMPLANT
GLOVE SURG TRIUMPH 8.0 PF LTX (GLOVE) ×2 IMPLANT
GOWN STRL REUS W/ TWL LRG LVL3 (GOWN DISPOSABLE) ×2 IMPLANT
GOWN STRL REUS W/TWL LRG LVL3 (GOWN DISPOSABLE) ×4
LENS IOL TECNIS EYHANCE 21.5 (Intraocular Lens) ×1 IMPLANT
MARKER SKIN DUAL TIP RULER LAB (MISCELLANEOUS) ×2 IMPLANT
NDL FILTER BLUNT 18X1 1/2 (NEEDLE) ×1 IMPLANT
NEEDLE FILTER BLUNT 18X 1/2SAF (NEEDLE) ×1
NEEDLE FILTER BLUNT 18X1 1/2 (NEEDLE) ×1 IMPLANT
PACK EYE AFTER SURG (MISCELLANEOUS) ×2 IMPLANT
PACK OPTHALMIC (MISCELLANEOUS) ×2 IMPLANT
PACK PORFILIO (MISCELLANEOUS) ×2 IMPLANT
SYR 3ML LL SCALE MARK (SYRINGE) ×2 IMPLANT
SYR TB 1ML LUER SLIP (SYRINGE) ×2 IMPLANT
WATER STERILE IRR 250ML POUR (IV SOLUTION) ×2 IMPLANT
WIPE NON LINTING 3.25X3.25 (MISCELLANEOUS) ×2 IMPLANT

## 2020-08-15 NOTE — H&P (Signed)
[place]   Primary Care Physician:  Derinda Late, MD Ophthalmologist: Dr. George Ina  Pre-Procedure History & Physical: HPI:  Claudia Terry is a 75 y.o. female here for cataract surgery.   Past Medical History:  Diagnosis Date  . Arthritis   . 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 GERD (gastroesophageal reflux disease) 8/01    Dr. Tiffany Kocher  . Hypertension   . Low back pain 11/95  . Menopause   . Osteoarthritis   . Restless leg syndrome 1999    Past Surgical History:  Procedure Laterality Date  . [place]   X2  . COLONOSCOPY  12/06/11   normal  . COLONOSCOPY WITH PROPOFOL N/A 04/13/2018   Procedure: COLONOSCOPY WITH PROPOFOL;  Surgeon: Manya Silvas, MD;  Location: [facility] ENDOSCOPY;  Service: Endoscopy;  Laterality: N/A;  . ESOPHAGOGASTRODUODENOSCOPY (EGD) WITH PROPOFOL N/A 02/23/2015   Procedure: ESOPHAGOGASTRODUODENOSCOPY (EGD) WITH PROPOFOL;  Surgeon: Manya Silvas, MD;  Location: [facility] ENDOSCOPY;  Service: Endoscopy;  Laterality: N/A;  . ESOPHAGOGASTRODUODENOSCOPY (EGD) WITH PROPOFOL N/A 04/13/2018   Procedure: ESOPHAGOGASTRODUODENOSCOPY (EGD) WITH PROPOFOL;  Surgeon: Manya Silvas, MD;  Location: [facility] ENDOSCOPY;  Service: Endoscopy;  Laterality: N/A;  . HIP CLOSED REDUCTION Left 08/08/2015   Procedure: CLOSED REDUCTION HIP;  Surgeon: Dereck Leep, MD;  Location: [facility] ORS;  Service: Orthopedics;  Laterality: Left;  . HIP CLOSED REDUCTION Left 09/18/2015   Procedure: CLOSED REDUCTION HIP;  Surgeon: Hessie Knows, MD;  Location: [facility] ORS;  Service: Orthopedics;  Laterality: Left;  . HIP CLOSED REDUCTION Left 12/01/2015   Procedure: CLOSED REDUCTION HIP;  Surgeon: Dereck Leep, MD;  Location: [facility] ORS;  Service: Orthopedics;  Laterality: Left;  no prep no incision  . JOINT REPLACEMENT  10/2016   left knee  . KNEE ARTHROPLASTY Left 10/28/2016   Procedure: COMPUTER ASSISTED TOTAL KNEE ARTHROPLASTY;   Surgeon: Dereck Leep, MD;  Location: [facility] ORS;  Service: Orthopedics;  Laterality: Left;  . KNEE ARTHROPLASTY Right 07/07/2017   Procedure: COMPUTER ASSISTED TOTAL KNEE ARTHROPLASTY;  Surgeon: Dereck Leep, MD;  Location: [facility] ORS;  Service: Orthopedics;  Laterality: Right;  . KNEE ARTHROSCOPY Right 11/14/2014   Procedure: ARTHROSCOPY KNEE;  Surgeon: Dereck Leep, MD;  Location: [facility] ORS;  Service: Orthopedics;  Laterality: Right;  Posterior horn menicus, anterior lateral menicus grade 3 chondromalacia  . phlebitis groin    . right foot surgery    . SHOULDER ARTHROSCOPY WITH OPEN ROTATOR CUFF REPAIR Left 03/10/2019   Procedure: LEFT SHOULDER ARTHROSCOPY, ARTHROSCOPIC ROTATOR CUFF REPAIR, DISTAL CLAVICLE EXCISION, SUBACROMIAL DECOMPRESSION, INTRA-ARTICULAR DEBRIDEMENT ;  Surgeon: Lovell Sheehan, MD;  Location: [facility] ORS;  Service: Orthopedics;  Laterality: Left;  . SHOULDER ARTHROSCOPY WITH ROTATOR CUFF REPAIR Left 06/21/2019   Procedure: SHOULDER ARTHROSCOPY WITH EXTENSIVE INTRAARTICULAR DEBRIDEMENT WITH REMOVAL OF HARDWARE;  Surgeon: Lovell Sheehan, MD;  Location: [place];  Service: Orthopedics;  Laterality: Left;  . TONSILLECTOMY    . TOTAL HIP ARTHROPLASTY Left 05/08/11   necrosis of hip  . TOTAL HIP ARTHROPLASTY Right 06/14/2015   Procedure: TOTAL HIP ARTHROPLASTY;  Surgeon: Dereck Leep, MD;  Location: [facility] ORS;  Service: Orthopedics;  Laterality: Right;  . TOTAL HIP REVISION Left 02/21/2016   Procedure: TOTAL HIP REVISION;  Surgeon: Dereck Leep, MD;  Location: [facility] ORS;  Service: Orthopedics;  Laterality: Left;    Prior to Admission medications   Medication  Sig Start Date End Date Taking? Authorizing Provider  acetaminophen (TYLENOL) 500 MG tablet Take 1,000 mg by mouth every 6 (six) hours as needed.   Yes [provider]  atenolol (TENORMIN) 50 MG tablet daily. 01/12/19  Yes [provider]  Biotin 5000 MCG TABS Take 10,000 mcg by mouth daily.   Yes  [provider]  Calcium Carbonate-Vitamin D (CALTRATE 600+D PO) Take by mouth 2 (two) times daily.   Yes [provider]  Cholecalciferol (VITAMIN D-3 SL) Place 5,000 Units under the tongue.   Yes [provider]  Coenzyme Q10 (CO Q-10) 200 MG CAPS Take 100 mg by mouth daily.   Yes [provider]  diclofenac Sodium (VOLTAREN) 1 % GEL diclofenac 1 % topical gel   Yes [provider]  Glucosamine HCl-MSM 1500-500 MG/30ML LIQD Take by mouth 2 (two) times daily.   Yes [provider]  ibuprofen (ADVIL) 200 MG tablet Take 400 mg by mouth every 6 (six) hours as needed.   Yes [provider]  Melatonin 10 MG TABS Take 10 mg by mouth at bedtime.   Yes [provider]  Menthol, Topical Analgesic, (BIOFREEZE EX) Apply 1 application topically daily as needed (foot pain).   Yes [provider]  multivitamin-lutein (OCUVITE-LUTEIN) CAPS capsule Take 1 capsule by mouth daily.   Yes [provider]  omeprazole (PRILOSEC) 20 MG capsule Take by mouth. 11/10/18  Yes [provider]  pramipexole (MIRAPEX) 1.5 MG tablet Take 1.5 mg by mouth at bedtime.    Yes [provider]  Red Yeast Rice 600 MG TABS Take 600 mg by mouth 2 (two) times daily.   Yes [provider]  traZODone (DESYREL) 50 MG tablet trazodone 50 mg tablet 11/02/19  Yes [provider]  Turmeric 400 MG CAPS Take by mouth daily.   Yes [provider]    Allergies as of 06/23/2020 - Review Complete 06/07/2020  Allergen Reaction Noted  . Penicillins Hives and Other (See Comments) 11/09/2012  . Erythromycin Rash 11/02/2014  . Other Nausea Only 12/24/2013  . Tetracyclines & related Rash 11/09/2012    Family History  Problem Relation Age of Onset  . Diabetes Mother   . Osteoarthritis Mother   . Diabetes Maternal Grandfather   . Hypertension Maternal Grandfather   . Ovarian cancer Paternal Aunt   . Cancer - Lung  Paternal Uncle     Social History   Socioeconomic History  . Marital status: Married    Spouse name: Not on file  . Number of children: Not on file  . Years of education: Not on file  . Highest education level: Not on file  Occupational History  . Not on file  Tobacco Use  . Smoking status: Never Smoker  . Smokeless tobacco: Never Used  Vaping Use  . Vaping Use: Never used  Substance and Sexual Activity  . Alcohol use: No  . Drug use: No  . Sexual activity: Yes    Partners: Male    Birth control/protection: Post-menopausal    Comment: husband vasectomy  Other Topics Concern  . Not on file  Social History Narrative  . Not on file   Social Determinants of Health   Financial Resource Strain: Not on file  Food Insecurity: Not on file  Transportation Needs: Not on file  Physical Activity: Not on file  Stress: Not on file  Social Connections: Not on file  Intimate Partner Violence: Not on file  Review of Systems: See HPI, otherwise negative ROS  Physical Exam: BP 137/81   Pulse 67   Temp (!) 97 F (36.1 C) (Temporal)   Resp 18   Ht 5\' 4"  (1.626 m)   Wt 85.3 kg   LMP 02/08/2005 (Approximate)   SpO2 99%   BMI 32.27 kg/m  General:   Alert,  pleasant and cooperative in NAD Head:  Normocephalic and atraumatic. Respiratory:  Normal work of breathing.  Impression/Plan: Claudia Terry is here for cataract surgery.  Risks, benefits, limitations, and alternatives regarding cataract surgery have been reviewed with the patient.  Questions have been answered.  All parties agreeable.   Birder Robson, MD  08/15/2020, 11:37 AM

## 2020-08-15 NOTE — Anesthesia Preprocedure Evaluation (Signed)
Anesthesia Evaluation  Patient identified by MRN, date of birth, ID band Patient awake    History of Anesthesia Complications Negative for: history of anesthetic complications  Airway Mallampati: I  TM Distance: >3 FB Neck ROM: Full    Dental no notable dental hx.    Pulmonary neg pulmonary ROS,    Pulmonary exam normal        Cardiovascular Exercise Tolerance: Good hypertension, Pt. on medications Normal cardiovascular exam     Neuro/Psych Restless leg syndrome negative psych ROS   GI/Hepatic Neg liver ROS, GERD  Medicated and Controlled,  Endo/Other  BMI 32  Renal/GU negative Renal ROS     Musculoskeletal   Abdominal   Peds  Hematology negative hematology ROS (+)   Anesthesia Other Findings   Reproductive/Obstetrics                            Anesthesia Physical Anesthesia Plan  ASA: II  Anesthesia Plan: MAC   Post-op Pain Management:    Induction: Intravenous  PONV Risk Score and Plan: 2 and Midazolam, TIVA and Treatment may vary due to age or medical condition  Airway Management Planned: Nasal Cannula and Natural Airway  Additional Equipment: None  Intra-op Plan:   Post-operative Plan:   Informed Consent: I have reviewed the patients History and Physical, chart, labs and discussed the procedure including the risks, benefits and alternatives for the proposed anesthesia with the patient or authorized representative who has indicated his/her understanding and acceptance.       Plan Discussed with: CRNA  Anesthesia Plan Comments:         Anesthesia Quick Evaluation

## 2020-08-15 NOTE — Op Note (Signed)
PREOPERATIVE DIAGNOSIS:  Nuclear sclerotic cataract of the left eye.   POSTOPERATIVE DIAGNOSIS:  Nuclear sclerotic cataract of the left eye.   OPERATIVE PROCEDURE:@   SURGEON:  Birder Robson, MD.   ANESTHESIA:  Anesthesiologist: Page, Adele Barthel, MD CRNA: Cameron Ali, CRNA  1.      Managed anesthesia care. 2.     0.60ml of Shugarcaine was instilled following the paracentesis   COMPLICATIONS:  None.   TECHNIQUE:   Stop and chop   DESCRIPTION OF PROCEDURE:  The patient was examined and consented in the preoperative holding area where the aforementioned topical anesthesia was applied to the left eye and then brought back to the Operating Room where the left eye was prepped and draped in the usual sterile ophthalmic fashion and a lid speculum was placed. A paracentesis was created with the side port blade and the anterior chamber was filled with viscoelastic. A near clear corneal incision was performed with the steel keratome. A continuous curvilinear capsulorrhexis was performed with a cystotome followed by the capsulorrhexis forceps. Hydrodissection and hydrodelineation were carried out with BSS on a blunt cannula. The lens was removed in a stop and chop  technique and the remaining cortical material was removed with the irrigation-aspiration handpiece. The capsular bag was inflated with viscoelastic and the Technis [id number] lens was placed in the capsular bag without complication. The remaining viscoelastic was removed from the eye with the irrigation-aspiration handpiece. The wounds were hydrated. The anterior chamber was flushed with BSS and the eye was inflated to physiologic pressure. 0.36ml Vigamox was placed in the anterior chamber. The wounds were found to be water tight. The eye was dressed with Combigan. The patient was given protective glasses to wear throughout the day and a shield with which to sleep tonight. The patient was also given drops with which to begin a drop regimen today and  will follow-up with me in one day. Implant Name Type Inv. Item Serial No. Manufacturer Lot No. LRB No. Used Action  LENS IOL TECNIS EYHANCE 21.5 - [id number] Intraocular Lens LENS IOL TECNIS EYHANCE 21.5 [id number] JOHNSON   Left 1 Implanted    Procedure(s): CATARACT EXTRACTION PHACO AND INTRAOCULAR LENS PLACEMENT (IOC) LEFT 6.60 00:39.8 (Left)  Electronically signed: Birder Robson 08/15/2020 12:02 PM

## 2020-08-15 NOTE — Transfer of Care (Signed)
Immediate Anesthesia Transfer of Care Note  Patient: Claudia Terry  Procedure(s) Performed: CATARACT EXTRACTION PHACO AND INTRAOCULAR LENS PLACEMENT (IOC) LEFT 6.60 00:39.8 (Left Eye)  Patient Location: PACU  Anesthesia Type: MAC  Level of Consciousness: awake, alert  and patient cooperative  Airway and Oxygen Therapy: Patient Spontanous Breathing and Patient connected to supplemental oxygen  Post-op Assessment: Post-op Vital signs reviewed, Patient's Cardiovascular Status Stable, Respiratory Function Stable, Patent Airway and No signs of Nausea or vomiting  Post-op Vital Signs: Reviewed and stable  Complications: No complications documented.

## 2020-08-15 NOTE — Anesthesia Postprocedure Evaluation (Signed)
Anesthesia Post Note  Patient: Claudia Terry  Procedure(s) Performed: CATARACT EXTRACTION PHACO AND INTRAOCULAR LENS PLACEMENT (IOC) LEFT 6.60 00:39.8 (Left Eye)     Patient location during evaluation: PACU Anesthesia Type: MAC Level of consciousness: awake and alert Pain management: pain level controlled Vital Signs Assessment: post-procedure vital signs reviewed and stable Respiratory status: spontaneous breathing Cardiovascular status: blood pressure returned to baseline Postop Assessment: no apparent nausea or vomiting, adequate PO intake and no headache Anesthetic complications: no   No complications documented.  Adele Barthel Nitza Schmid

## 2020-08-16 ENCOUNTER — Encounter: Payer: Self-pay | Admitting: Ophthalmology

## 2020-08-17 ENCOUNTER — Ambulatory Visit: Payer: PPO | Attending: Family Medicine

## 2020-08-17 ENCOUNTER — Other Ambulatory Visit: Payer: Self-pay

## 2020-08-17 DIAGNOSIS — M25675 Stiffness of left foot, not elsewhere classified: Secondary | ICD-10-CM | POA: Diagnosis not present

## 2020-08-17 DIAGNOSIS — M79672 Pain in left foot: Secondary | ICD-10-CM | POA: Diagnosis not present

## 2020-08-17 DIAGNOSIS — M25674 Stiffness of right foot, not elsewhere classified: Secondary | ICD-10-CM | POA: Diagnosis not present

## 2020-08-17 DIAGNOSIS — M79671 Pain in right foot: Secondary | ICD-10-CM

## 2020-08-17 NOTE — Therapy (Signed)
[facility] [address] [place], [address] Phone: [phone]   Fax:  [phone]  Physical Therapy Treatment  Patient Details  Name: Claudia Terry MRN: [id number] Date of Birth: 15-Jul-1945 Referring Provider (PT): babaoff MD   Encounter Date: 08/17/2020   PT End of Session - 08/17/20 0820    Visit Number 8    Number of Visits 17    Date for PT Re-Evaluation 09/06/20    PT Start Time 0815    PT Stop Time 0900    PT Time Calculation (min) 45 min    Activity Tolerance Patient tolerated treatment well    Behavior During Therapy [facility] for tasks assessed/performed           Past Medical History:  Diagnosis Date  . Arthritis   . 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 GERD (gastroesophageal reflux disease) 8/01    Dr. Tiffany Kocher  . Hypertension   . Low back pain 11/95  . Menopause   . Osteoarthritis   . Restless leg syndrome 1999    Past Surgical History:  Procedure Laterality Date  . CATARACT EXTRACTION W/PHACO Left 08/15/2020   Procedure: CATARACT EXTRACTION PHACO AND INTRAOCULAR LENS PLACEMENT (IOC) LEFT 6.60 00:39.8;  Surgeon: Birder Robson, MD;  Location: [place];  Service: Ophthalmology;  Laterality: Left;  . [place]   X2  . COLONOSCOPY  12/06/11   normal  . COLONOSCOPY WITH PROPOFOL N/A 04/13/2018   Procedure: COLONOSCOPY WITH PROPOFOL;  Surgeon: Manya Silvas, MD;  Location: [facility] ENDOSCOPY;  Service: Endoscopy;  Laterality: N/A;  . ESOPHAGOGASTRODUODENOSCOPY (EGD) WITH PROPOFOL N/A 02/23/2015   Procedure: ESOPHAGOGASTRODUODENOSCOPY (EGD) WITH PROPOFOL;  Surgeon: Manya Silvas, MD;  Location: [facility] ENDOSCOPY;  Service: Endoscopy;  Laterality: N/A;  . ESOPHAGOGASTRODUODENOSCOPY (EGD) WITH PROPOFOL N/A 04/13/2018   Procedure: ESOPHAGOGASTRODUODENOSCOPY (EGD) WITH PROPOFOL;  Surgeon: Manya Silvas, MD;  Location: [facility]  ENDOSCOPY;  Service: Endoscopy;  Laterality: N/A;  . HIP CLOSED REDUCTION Left 08/08/2015   Procedure: CLOSED REDUCTION HIP;  Surgeon: Dereck Leep, MD;  Location: [facility];  Service: Orthopedics;  Laterality: Left;  . HIP CLOSED REDUCTION Left 09/18/2015   Procedure: CLOSED REDUCTION HIP;  Surgeon: Hessie Knows, MD;  Location: [facility];  Service: Orthopedics;  Laterality: Left;  . HIP CLOSED REDUCTION Left 12/01/2015   Procedure: CLOSED REDUCTION HIP;  Surgeon: Dereck Leep, MD;  Location: [facility];  Service: Orthopedics;  Laterality: Left;  no prep no incision  . JOINT REPLACEMENT  10/2016   left knee  . KNEE ARTHROPLASTY Left 10/28/2016   Procedure: COMPUTER ASSISTED TOTAL KNEE ARTHROPLASTY;  Surgeon: Dereck Leep, MD;  Location: [facility];  Service: Orthopedics;  Laterality: Left;  . KNEE ARTHROPLASTY Right 07/07/2017   Procedure: COMPUTER ASSISTED TOTAL KNEE ARTHROPLASTY;  Surgeon: Dereck Leep, MD;  Location: [facility];  Service: Orthopedics;  Laterality: Right;  . KNEE ARTHROSCOPY Right 11/14/2014   Procedure: ARTHROSCOPY KNEE;  Surgeon: Dereck Leep, MD;  Location: [facility];  Service: Orthopedics;  Laterality: Right;  Posterior horn menicus, anterior lateral menicus grade 3 chondromalacia  . phlebitis groin    . right foot surgery    . SHOULDER ARTHROSCOPY WITH OPEN ROTATOR CUFF REPAIR Left 03/10/2019   Procedure: LEFT SHOULDER ARTHROSCOPY, ARTHROSCOPIC ROTATOR CUFF REPAIR, DISTAL CLAVICLE EXCISION, SUBACROMIAL DECOMPRESSION, INTRA-ARTICULAR DEBRIDEMENT ;  Surgeon: Lovell Sheehan, MD;  Location: [facility];  Service: Orthopedics;  Laterality: Left;  . SHOULDER ARTHROSCOPY WITH ROTATOR CUFF REPAIR Left 06/21/2019   Procedure: SHOULDER ARTHROSCOPY WITH EXTENSIVE INTRAARTICULAR DEBRIDEMENT WITH REMOVAL OF HARDWARE;  Surgeon: Lovell Sheehan, MD;  Location: [place];  Service: Orthopedics;  Laterality: Left;  . TONSILLECTOMY    . TOTAL HIP ARTHROPLASTY Left 05/08/11   necrosis  of hip  . TOTAL HIP ARTHROPLASTY Right 06/14/2015   Procedure: TOTAL HIP ARTHROPLASTY;  Surgeon: Dereck Leep, MD;  Location: [facility];  Service: Orthopedics;  Laterality: Right;  . TOTAL HIP REVISION Left 02/21/2016   Procedure: TOTAL HIP REVISION;  Surgeon: Dereck Leep, MD;  Location: [facility];  Service: Orthopedics;  Laterality: Left;    There were no vitals filed for this visit.   Subjective Assessment - 08/17/20 0817    Subjective Patient states she had her eye surgery last Tuesday. Patient states she would like avoid laying back, throwing balls, lifting and bending forward.    Pertinent History PMH: B TKA, B THA    Limitations Walking;Standing    How long can you sit comfortably? unlimited    How long can you stand comfortably? 10 min    How long can you walk comfortably? unknown    Patient Stated Goals Return to regular activities    Currently in Pain? No/denies                  TREATMENT Therapeutic Exercise Pre-Gait with focus on performing plantarflexion with R foot forward - 2 x 15 Total gym level 17 level - x15 with patient positioned in supine, x 15 level 19  Marches in standing with airex under feet - 2 x 15 Side stepping across airex beam - 2 x 15 Step ups on airex pad - x 20  B Performed exercises to improve strengthening     PT Education - 08/17/20 0820    Education Details form/technique with exercise    Person(s) Educated Patient    Methods Explanation;Demonstration    Comprehension Verbalized understanding;Returned demonstration            PT Short Term Goals - 07/13/20 1941      PT SHORT TERM GOAL #1   Title Patient will be independent with HEP to continue benefits of therapy after discharge    Baseline dependent    Time 4    Period Weeks    Status New             PT Long Term Goals - 07/13/20 1941      PT LONG TERM GOAL #1   Title Patient will be able to walk > 30 min without increase in pain to indicate improvement in ankle  funciton and return to aerobic exercise    Baseline <5 min    Time 8    Period Weeks    Status New    Target Date 09/07/20      PT LONG TERM GOAL #2   Title Patient will demonstrate significant improvement in FOTO score to indicate significant improvement in ankle functioning    Time 8    Period Weeks    Status New      PT LONG TERM GOAL #3   Title Patient will improve 51mwt to over 1 m/s with LRAD to better perform community amb with less fall risk.    Baseline .57 m/s    Time 8    Period Weeks    Status New  Target Date 09/08/19                 Plan - 08/17/20 0848    Clinical Impression Statement Performed exercises focused on increasing LE stabilization and ankle strategies during today's session. Patient continues to have limited strength along her gastroc with heel raises limiting walking ability, although there is an improvement with ability to perform total gym with less difficulty. Patient will benefit from further skilled therapy to return to prior level of function.    Personal Factors and Comorbidities Comorbidity 3+    Comorbidities obesity, B TKAs, B THAs    Examination-Activity Limitations Lift;Squat;Stairs    Examination-Participation Restrictions Church;Community Activity;Cleaning    Stability/Clinical Decision Making Stable/Uncomplicated    Rehab Potential Good    PT Frequency 2x / week    PT Duration 8 weeks    PT Treatment/Interventions Electrical Stimulation;Cryotherapy;Moist Heat;Functional mobility training;Therapeutic activities;Therapeutic exercise;Gait training;Neuromuscular re-education;Manual techniques;Dry needling;Passive range of motion;Patient/family education;Joint Manipulations;Spinal Manipulations    PT Next Visit Plan Improve ankle AROM and LE strength    PT Home Exercise Plan See education section    Consulted and Agree with Plan of Care Patient           Patient will benefit from skilled therapeutic intervention in order to  improve the following deficits and impairments:  Abnormal gait,Pain,Decreased coordination,Decreased mobility,Decreased scar mobility,Increased muscle spasms,Postural dysfunction,Decreased endurance,Decreased activity tolerance,Decreased range of motion,Decreased strength,Hypomobility,Decreased balance,Difficulty walking  Visit Diagnosis: Pain in left foot  Pain in right foot  Stiffness of right foot, not elsewhere classified  Stiffness of left foot, not elsewhere classified     Problem List Patient Active Problem List   Diagnosis Date Noted  . Generalized osteoarthritis of multiple sites 07/28/2018  . Varicose veins of both legs with edema 07/28/2018  . FH: colon polyps 02/10/2018  . S/P total knee arthroplasty 07/07/2017  . Status post total left knee replacement 10/28/2016  . Failed total hip arthroplasty with dislocation ([place]) 12/01/2015  . S/P closed reduction of dislocated total hip prosthesis 08/08/2015  . S/P total hip arthroplasty 06/14/2015  . Benign essential HTN 12/28/2013  . Acid reflux 12/28/2013  . HLD (hyperlipidemia) 12/28/2013  . Restless leg 12/28/2013    Blythe Stanford, PT DPT 08/17/2020, 9:00 AM  [facility] [address], [place], [address] Phone: [phone]   Fax:  [phone]  Name: MANUELA HALBUR MRN: [id number] Date of Birth: Oct 18, 1945

## 2020-08-22 ENCOUNTER — Encounter: Payer: Self-pay | Admitting: Ophthalmology

## 2020-08-22 ENCOUNTER — Other Ambulatory Visit: Payer: Self-pay

## 2020-08-22 ENCOUNTER — Ambulatory Visit: Payer: PPO

## 2020-08-22 DIAGNOSIS — M79671 Pain in right foot: Secondary | ICD-10-CM

## 2020-08-22 DIAGNOSIS — M79672 Pain in left foot: Secondary | ICD-10-CM | POA: Diagnosis not present

## 2020-08-22 DIAGNOSIS — M25674 Stiffness of right foot, not elsewhere classified: Secondary | ICD-10-CM

## 2020-08-22 NOTE — Therapy (Signed)
[facility] [address] [place], [address] Phone: [phone]   Fax:  [phone]  Physical Therapy Treatment  Patient Details  Name: Claudia Terry MRN: [id number] Date of Birth: 05-27-1946 Referring Provider (PT): babaoff MD   Encounter Date: 08/22/2020   PT End of Session - 08/22/20 1309    Visit Number 9    Number of Visits 17    Date for PT Re-Evaluation 09/06/20    PT Start Time 1301    PT Stop Time 1345    PT Time Calculation (min) 44 min    Activity Tolerance Patient tolerated treatment well    Behavior During Therapy [facility] for tasks assessed/performed           Past Medical History:  Diagnosis Date  . Arthritis   . 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 GERD (gastroesophageal reflux disease) 8/01    Dr. Tiffany Kocher  . Hypertension   . Low back pain 11/95  . Menopause   . Osteoarthritis   . Restless leg syndrome 1999    Past Surgical History:  Procedure Laterality Date  . CATARACT EXTRACTION W/PHACO Left 08/15/2020   Procedure: CATARACT EXTRACTION PHACO AND INTRAOCULAR LENS PLACEMENT (IOC) LEFT 6.60 00:39.8;  Surgeon: Birder Robson, MD;  Location: [place];  Service: Ophthalmology;  Laterality: Left;  . [place]   X2  . COLONOSCOPY  12/06/11   normal  . COLONOSCOPY WITH PROPOFOL N/A 04/13/2018   Procedure: COLONOSCOPY WITH PROPOFOL;  Surgeon: Manya Silvas, MD;  Location: [facility] ENDOSCOPY;  Service: Endoscopy;  Laterality: N/A;  . ESOPHAGOGASTRODUODENOSCOPY (EGD) WITH PROPOFOL N/A 02/23/2015   Procedure: ESOPHAGOGASTRODUODENOSCOPY (EGD) WITH PROPOFOL;  Surgeon: Manya Silvas, MD;  Location: [facility] ENDOSCOPY;  Service: Endoscopy;  Laterality: N/A;  . ESOPHAGOGASTRODUODENOSCOPY (EGD) WITH PROPOFOL N/A 04/13/2018   Procedure: ESOPHAGOGASTRODUODENOSCOPY (EGD) WITH PROPOFOL;  Surgeon: Manya Silvas, MD;  Location: [facility]  ENDOSCOPY;  Service: Endoscopy;  Laterality: N/A;  . HIP CLOSED REDUCTION Left 08/08/2015   Procedure: CLOSED REDUCTION HIP;  Surgeon: Dereck Leep, MD;  Location: [facility];  Service: Orthopedics;  Laterality: Left;  . HIP CLOSED REDUCTION Left 09/18/2015   Procedure: CLOSED REDUCTION HIP;  Surgeon: Hessie Knows, MD;  Location: [facility];  Service: Orthopedics;  Laterality: Left;  . HIP CLOSED REDUCTION Left 12/01/2015   Procedure: CLOSED REDUCTION HIP;  Surgeon: Dereck Leep, MD;  Location: [facility];  Service: Orthopedics;  Laterality: Left;  no prep no incision  . JOINT REPLACEMENT  10/2016   left knee  . KNEE ARTHROPLASTY Left 10/28/2016   Procedure: COMPUTER ASSISTED TOTAL KNEE ARTHROPLASTY;  Surgeon: Dereck Leep, MD;  Location: [facility];  Service: Orthopedics;  Laterality: Left;  . KNEE ARTHROPLASTY Right 07/07/2017   Procedure: COMPUTER ASSISTED TOTAL KNEE ARTHROPLASTY;  Surgeon: Dereck Leep, MD;  Location: [facility];  Service: Orthopedics;  Laterality: Right;  . KNEE ARTHROSCOPY Right 11/14/2014   Procedure: ARTHROSCOPY KNEE;  Surgeon: Dereck Leep, MD;  Location: [facility];  Service: Orthopedics;  Laterality: Right;  Posterior horn menicus, anterior lateral menicus grade 3 chondromalacia  . phlebitis groin    . right foot surgery    . SHOULDER ARTHROSCOPY WITH OPEN ROTATOR CUFF REPAIR Left 03/10/2019   Procedure: LEFT SHOULDER ARTHROSCOPY, ARTHROSCOPIC ROTATOR CUFF REPAIR, DISTAL CLAVICLE EXCISION, SUBACROMIAL DECOMPRESSION, INTRA-ARTICULAR DEBRIDEMENT ;  Surgeon: Lovell Sheehan, MD;  Location: [facility];  Service: Orthopedics;  Laterality: Left;  . SHOULDER ARTHROSCOPY WITH ROTATOR CUFF REPAIR Left 06/21/2019   Procedure: SHOULDER ARTHROSCOPY WITH EXTENSIVE INTRAARTICULAR DEBRIDEMENT WITH REMOVAL OF HARDWARE;  Surgeon: Lovell Sheehan, MD;  Location: [place];  Service: Orthopedics;  Laterality: Left;  . TONSILLECTOMY    . TOTAL HIP ARTHROPLASTY Left 05/08/11   necrosis  of hip  . TOTAL HIP ARTHROPLASTY Right 06/14/2015   Procedure: TOTAL HIP ARTHROPLASTY;  Surgeon: Dereck Leep, MD;  Location: [facility];  Service: Orthopedics;  Laterality: Right;  . TOTAL HIP REVISION Left 02/21/2016   Procedure: TOTAL HIP REVISION;  Surgeon: Dereck Leep, MD;  Location: [facility];  Service: Orthopedics;  Laterality: Left;    There were no vitals filed for this visit.   Subjective Assessment - 08/22/20 1305    Subjective Patient reports she had a follow up of her eye cataract surgery which went well overall. Patient states the foot has been sore from performing the exercises    Pertinent History PMH: B TKA, B THA    Limitations Walking;Standing    How long can you sit comfortably? unlimited    How long can you stand comfortably? 10 min    How long can you walk comfortably? unknown    Patient Stated Goals Return to regular activities    Currently in Pain? No/denies                 TREATMENT Therapeutic Exercise Pre-Gait with focus on performing plantarflexion with R foot forward - 2 x 15 Step ups on airex pad - x 20  B Reviewed exercises to be performed at home: - Reviewed Heel raises - Hip exercises to be performed at home - SLS in standing  - Walking   Performed exercises in standing       PT Education - 08/22/20 1308    Education Details form/technique with exercise    Person(s) Educated Patient    Methods Explanation;Demonstration    Comprehension Verbalized understanding;Returned demonstration            PT Short Term Goals - 07/13/20 1941      PT SHORT TERM GOAL #1   Title Patient will be independent with HEP to continue benefits of therapy after discharge    Baseline dependent    Time 4    Period Weeks    Status New             PT Long Term Goals - 08/22/20 1315      PT LONG TERM GOAL #1   Title Patient will be able to walk > 30 min without increase in pain to indicate improvement in ankle funciton and return to aerobic  exercise    Baseline <5 min; 08/22/2020: 1 hour no pain    Time 8    Period Weeks    Status Achieved      PT LONG TERM GOAL #2   Title Patient will demonstrate significant improvement in FOTO score to indicate significant improvement in ankle functioning    Baseline Eval: 38; 08/22/2020: 66    Time 8    Period Weeks    Status Achieved      PT LONG TERM GOAL #3   Title Patient will improve 64mt to over 1 m/s with LRAD to better perform community amb with less fall risk.    Baseline .57 m/s; 08/22/2020: .89 m/s    Time 8    Period Weeks  Status Partially Met                 Plan - 08/22/20 1309    Clinical Impression Statement Performed    Personal Factors and Comorbidities Comorbidity 3+    Comorbidities obesity, B TKAs, B THAs    Examination-Activity Limitations Lift;Squat;Stairs    Examination-Participation Restrictions Church;Community Activity;Cleaning    Stability/Clinical Decision Making Stable/Uncomplicated    Rehab Potential Good    PT Frequency 2x / week    PT Duration 8 weeks    PT Treatment/Interventions Electrical Stimulation;Cryotherapy;Moist Heat;Functional mobility training;Therapeutic activities;Therapeutic exercise;Gait training;Neuromuscular re-education;Manual techniques;Dry needling;Passive range of motion;Patient/family education;Joint Manipulations;Spinal Manipulations    PT Next Visit Plan Improve ankle AROM and LE strength    PT Home Exercise Plan See education section    Consulted and Agree with Plan of Care Patient           Patient will benefit from skilled therapeutic intervention in order to improve the following deficits and impairments:  Abnormal gait,Pain,Decreased coordination,Decreased mobility,Decreased scar mobility,Increased muscle spasms,Postural dysfunction,Decreased endurance,Decreased activity tolerance,Decreased range of motion,Decreased strength,Hypomobility,Decreased balance,Difficulty walking  Visit Diagnosis: Pain in left  foot  Pain in right foot  Stiffness of right foot, not elsewhere classified     Problem List Patient Active Problem List   Diagnosis Date Noted  . Generalized osteoarthritis of multiple sites 07/28/2018  . Varicose veins of both legs with edema 07/28/2018  . FH: colon polyps 02/10/2018  . S/P total knee arthroplasty 07/07/2017  . Status post total left knee replacement 10/28/2016  . Failed total hip arthroplasty with dislocation ([place]) 12/01/2015  . S/P closed reduction of dislocated total hip prosthesis 08/08/2015  . S/P total hip arthroplasty 06/14/2015  . Benign essential HTN 12/28/2013  . Acid reflux 12/28/2013  . HLD (hyperlipidemia) 12/28/2013  . Restless leg 12/28/2013    Blythe Stanford, PT DPT 08/22/2020, 1:29 PM  [facility] [address], [place], [address] Phone: [phone]   Fax:  [phone]  Name: Claudia Terry MRN: [id number] Date of Birth: Jun 20, 1945

## 2020-08-24 NOTE — Discharge Instructions (Signed)

## 2020-08-25 ENCOUNTER — Other Ambulatory Visit: Payer: Self-pay

## 2020-08-25 ENCOUNTER — Other Ambulatory Visit
Admission: RE | Admit: 2020-08-25 | Discharge: 2020-08-25 | Disposition: A | Discharge status: Home / Self Care | Payer: PPO | Source: Ambulatory Visit | Attending: Ophthalmology | Admitting: Ophthalmology

## 2020-08-25 DIAGNOSIS — Z20822 Contact with and (suspected) exposure to covid-19: Secondary | ICD-10-CM | POA: Diagnosis not present

## 2020-08-25 DIAGNOSIS — Z01812 Encounter for preprocedural laboratory examination: Secondary | ICD-10-CM | POA: Insufficient documentation

## 2020-08-26 LAB — SARS CORONAVIRUS 2 (TAT 6-24 HRS): SARS Coronavirus 2: NEGATIVE

## 2020-08-29 ENCOUNTER — Ambulatory Visit: Payer: PPO | Admitting: Anesthesiology

## 2020-08-29 ENCOUNTER — Ambulatory Visit
Admission: RE | Admit: 2020-08-29 | Discharge: 2020-08-29 | Disposition: A | Discharge status: Home / Self Care | Payer: PPO | Attending: Ophthalmology | Admitting: Ophthalmology

## 2020-08-29 ENCOUNTER — Other Ambulatory Visit: Payer: Self-pay

## 2020-08-29 ENCOUNTER — Encounter
Admission: RE | Disposition: A | Discharge status: Home / Self Care | Payer: Self-pay | Source: Home / Self Care | Attending: Ophthalmology

## 2020-08-29 ENCOUNTER — Encounter: Payer: Self-pay | Admitting: Ophthalmology

## 2020-08-29 DIAGNOSIS — Z801 Family history of malignant neoplasm of trachea, bronchus and lung: Secondary | ICD-10-CM | POA: Diagnosis not present

## 2020-08-29 DIAGNOSIS — Z8249 Family history of ischemic heart disease and other diseases of the circulatory system: Secondary | ICD-10-CM | POA: Insufficient documentation

## 2020-08-29 DIAGNOSIS — H2511 Age-related nuclear cataract, right eye: Secondary | ICD-10-CM | POA: Insufficient documentation

## 2020-08-29 DIAGNOSIS — Z91048 Other nonmedicinal substance allergy status: Secondary | ICD-10-CM | POA: Insufficient documentation

## 2020-08-29 DIAGNOSIS — Z833 Family history of diabetes mellitus: Secondary | ICD-10-CM | POA: Insufficient documentation

## 2020-08-29 DIAGNOSIS — Z880 Allergy status to penicillin: Secondary | ICD-10-CM | POA: Diagnosis not present

## 2020-08-29 DIAGNOSIS — Z9842 Cataract extraction status, left eye: Secondary | ICD-10-CM | POA: Diagnosis not present

## 2020-08-29 DIAGNOSIS — Z961 Presence of intraocular lens: Secondary | ICD-10-CM | POA: Diagnosis not present

## 2020-08-29 DIAGNOSIS — Z79899 Other long term (current) drug therapy: Secondary | ICD-10-CM | POA: Insufficient documentation

## 2020-08-29 DIAGNOSIS — Z8041 Family history of malignant neoplasm of ovary: Secondary | ICD-10-CM | POA: Diagnosis not present

## 2020-08-29 DIAGNOSIS — Z881 Allergy status to other antibiotic agents status: Secondary | ICD-10-CM | POA: Insufficient documentation

## 2020-08-29 DIAGNOSIS — H25811 Combined forms of age-related cataract, right eye: Secondary | ICD-10-CM | POA: Diagnosis not present

## 2020-08-29 HISTORY — PX: CATARACT EXTRACTION W/PHACO: SHX586

## 2020-08-29 SURGERY — PHACOEMULSIFICATION, CATARACT, WITH IOL INSERTION
Anesthesia: Monitor Anesthesia Care | Site: Eye | Laterality: Right

## 2020-08-29 MED ORDER — BRIMONIDINE TARTRATE-TIMOLOL 0.2-0.5 % OP SOLN
OPHTHALMIC | Status: DC | PRN
  Administered 2020-08-29: 1 [drp] via OPHTHALMIC

## 2020-08-29 MED ORDER — FENTANYL CITRATE (PF) 100 MCG/2ML IJ SOLN
INTRAMUSCULAR | Status: DC | PRN
  Administered 2020-08-29: 50 ug via INTRAVENOUS

## 2020-08-29 MED ORDER — PHENYLEPHRINE HCL 10 % OP SOLN
1.0000 [drp] | OPHTHALMIC | Status: DC | PRN
  Administered 2020-08-29 (×3): 1 [drp] via OPHTHALMIC

## 2020-08-29 MED ORDER — MIDAZOLAM HCL 2 MG/2ML IJ SOLN
INTRAMUSCULAR | Status: DC | PRN
  Administered 2020-08-29 (×2): 1 mg via INTRAVENOUS

## 2020-08-29 MED ORDER — LIDOCAINE HCL (PF) 2 % IJ SOLN
INTRAOCULAR | Status: DC | PRN
  Administered 2020-08-29: 1 mL

## 2020-08-29 MED ORDER — NA CHONDROIT SULF-NA HYALURON 40-17 MG/ML IO SOLN
INTRAOCULAR | Status: DC | PRN
  Administered 2020-08-29: 1 mL via INTRAOCULAR

## 2020-08-29 MED ORDER — TETRACAINE HCL 0.5 % OP SOLN
1.0000 [drp] | OPHTHALMIC | Status: DC | PRN
  Administered 2020-08-29 (×3): 1 [drp] via OPHTHALMIC

## 2020-08-29 MED ORDER — EPINEPHRINE PF 1 MG/ML IJ SOLN
INTRAOCULAR | Status: DC | PRN
  Administered 2020-08-29: 46 mL via OPHTHALMIC

## 2020-08-29 MED ORDER — MOXIFLOXACIN HCL 0.5 % OP SOLN
OPHTHALMIC | Status: DC | PRN
  Administered 2020-08-29: 0.2 mL via OPHTHALMIC

## 2020-08-29 MED ORDER — CYCLOPENTOLATE HCL 2 % OP SOLN
1.0000 [drp] | OPHTHALMIC | Status: DC | PRN
  Administered 2020-08-29 (×3): 1 [drp] via OPHTHALMIC

## 2020-08-29 SURGICAL SUPPLY — 17 items
CANNULA ANT/CHMB 27G (MISCELLANEOUS) ×2 IMPLANT
CANNULA ANT/CHMB 27GA (MISCELLANEOUS) ×4 IMPLANT
GLOVE SURG TRIUMPH 8.0 PF LTX (GLOVE) ×2 IMPLANT
GOWN STRL REUS W/ TWL LRG LVL3 (GOWN DISPOSABLE) ×2 IMPLANT
GOWN STRL REUS W/TWL LRG LVL3 (GOWN DISPOSABLE) ×4
LENS IOL TECNIS EYHANCE 21.5 (Intraocular Lens) ×1 IMPLANT
MARKER SKIN DUAL TIP RULER LAB (MISCELLANEOUS) ×2 IMPLANT
NDL FILTER BLUNT 18X1 1/2 (NEEDLE) ×1 IMPLANT
NEEDLE FILTER BLUNT 18X 1/2SAF (NEEDLE) ×1
NEEDLE FILTER BLUNT 18X1 1/2 (NEEDLE) ×1 IMPLANT
PACK EYE AFTER SURG (MISCELLANEOUS) ×2 IMPLANT
PACK OPTHALMIC (MISCELLANEOUS) ×2 IMPLANT
PACK PORFILIO (MISCELLANEOUS) ×2 IMPLANT
SYR 3ML LL SCALE MARK (SYRINGE) ×2 IMPLANT
SYR TB 1ML LUER SLIP (SYRINGE) ×2 IMPLANT
WATER STERILE IRR 250ML POUR (IV SOLUTION) ×2 IMPLANT
WIPE NON LINTING 3.25X3.25 (MISCELLANEOUS) ×2 IMPLANT

## 2020-08-29 NOTE — Op Note (Signed)
PREOPERATIVE DIAGNOSIS:  Nuclear sclerotic cataract of the right eye.   POSTOPERATIVE DIAGNOSIS:  H25.11 Cataract   OPERATIVE PROCEDURE:@   SURGEON:  Birder Robson, MD.   ANESTHESIA:  Anesthesiologist: Carlos American, MD CRNA: Cameron Ali, CRNA  1.      Managed anesthesia care. 2.      0.73ml of Shugarcaine was instilled in the eye following the paracentesis.   COMPLICATIONS:  None.   TECHNIQUE:   Stop and chop   DESCRIPTION OF PROCEDURE:  The patient was examined and consented in the preoperative holding area where the aforementioned topical anesthesia was applied to the right eye and then brought back to the Operating Room where the right eye was prepped and draped in the usual sterile ophthalmic fashion and a lid speculum was placed. A paracentesis was created with the side port blade and the anterior chamber was filled with viscoelastic. A near clear corneal incision was performed with the steel keratome. A continuous curvilinear capsulorrhexis was performed with a cystotome followed by the capsulorrhexis forceps. Hydrodissection and hydrodelineation were carried out with BSS on a blunt cannula. The lens was removed in a stop and chop  technique and the remaining cortical material was removed with the irrigation-aspiration handpiece. The capsular bag was inflated with viscoelastic and the Technis [id number]  lens was placed in the capsular bag without complication. The remaining viscoelastic was removed from the eye with the irrigation-aspiration handpiece. The wounds were hydrated. The anterior chamber was flushed with BSS and the eye was inflated to physiologic pressure. 0.62ml of Vigamox was placed in the anterior chamber. The wounds were found to be water tight. The eye was dressed with Combigan. The patient was given protective glasses to wear throughout the day and a shield with which to sleep tonight. The patient was also given drops with which to begin a drop regimen today and will  follow-up with me in one day. Implant Name Type Inv. Item Serial No. Manufacturer Lot No. LRB No. Used Action  LENS IOL TECNIS EYHANCE 21.5 - [id number] Intraocular Lens LENS IOL TECNIS EYHANCE 21.5 [id number] JOHNSON   Right 1 Implanted   Procedure(s): CATARACT EXTRACTION PHACO AND INTRAOCULAR LENS PLACEMENT (IOC) RIGHT 6.94 00:42.3 (Right)  Electronically signed: Birder Robson 08/29/2020 9:39 AM

## 2020-08-29 NOTE — Anesthesia Postprocedure Evaluation (Signed)
Anesthesia Post Note  Patient: Claudia Terry  Procedure(s) Performed: CATARACT EXTRACTION PHACO AND INTRAOCULAR LENS PLACEMENT (IOC) RIGHT 6.94 00:42.3 (Right Eye)     Patient location during evaluation: PACU Anesthesia Type: MAC Level of consciousness: awake and alert Pain management: pain level controlled Vital Signs Assessment: post-procedure vital signs reviewed and stable Respiratory status: spontaneous breathing, nonlabored ventilation, respiratory function stable and patient connected to nasal cannula oxygen Cardiovascular status: stable and blood pressure returned to baseline Postop Assessment: no apparent nausea or vomiting Anesthetic complications: no   No complications documented.  Wanda Plump Michaelpaul Apo

## 2020-08-29 NOTE — Anesthesia Preprocedure Evaluation (Signed)
Anesthesia Evaluation  Patient identified by MRN, date of birth, ID band Patient awake    Reviewed: Allergy & Precautions, NPO status , Patient's Chart, lab work & pertinent test results  History of Anesthesia Complications Negative for: history of anesthetic complications  Airway Mallampati: I  TM Distance: >3 FB Neck ROM: Full    Dental no notable dental hx.    Pulmonary neg pulmonary ROS,    Pulmonary exam normal        Cardiovascular Exercise Tolerance: Good hypertension, Pt. on medications Normal cardiovascular exam     Neuro/Psych Restless leg syndrome negative psych ROS   GI/Hepatic Neg liver ROS, GERD  Medicated and Controlled,  Endo/Other  BMI 32  Renal/GU negative Renal ROS     Musculoskeletal  (+) Arthritis ,   Abdominal   Peds  Hematology negative hematology ROS (+)   Anesthesia Other Findings   Reproductive/Obstetrics                             Anesthesia Physical  Anesthesia Plan  ASA: II  Anesthesia Plan: MAC   Post-op Pain Management:    Induction: Intravenous  PONV Risk Score and Plan: 2 and Midazolam, TIVA and Treatment may vary due to age or medical condition  Airway Management Planned: Nasal Cannula and Natural Airway  Additional Equipment: None  Intra-op Plan:   Post-operative Plan:   Informed Consent: I have reviewed the patients History and Physical, chart, labs and discussed the procedure including the risks, benefits and alternatives for the proposed anesthesia with the patient or authorized representative who has indicated his/her understanding and acceptance.       Plan Discussed with: CRNA  Anesthesia Plan Comments:         Anesthesia Quick Evaluation

## 2020-08-29 NOTE — Anesthesia Procedure Notes (Signed)
Procedure Name: MAC Date/Time: 08/29/2020 9:22 AM Performed by: Cameron Ali, CRNA Pre-anesthesia Checklist: Patient identified, Emergency Drugs available, Suction available, Timeout performed and Patient being monitored Patient Re-evaluated:Patient Re-evaluated prior to induction Oxygen Delivery Method: Nasal cannula Placement Confirmation: positive ETCO2

## 2020-08-29 NOTE — H&P (Signed)
[place]   Primary Care Physician:  Derinda Late, MD Ophthalmologist: Dr. George Ina  Pre-Procedure History & Physical: HPI:  Claudia Terry is a 75 y.o. female here for cataract surgery.   Past Medical History:  Diagnosis Date  . Arthritis   . 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 GERD (gastroesophageal reflux disease) 8/01    Dr. Tiffany Kocher  . Hypertension   . Low back pain 11/95  . Menopause   . Osteoarthritis   . Restless leg syndrome 1999    Past Surgical History:  Procedure Laterality Date  . CATARACT EXTRACTION W/PHACO Left 08/15/2020   Procedure: CATARACT EXTRACTION PHACO AND INTRAOCULAR LENS PLACEMENT (IOC) LEFT 6.60 00:39.8;  Surgeon: Birder Robson, MD;  Location: [facility];  Service: Ophthalmology;  Laterality: Left;  . [place]   X2  . COLONOSCOPY  12/06/11   normal  . COLONOSCOPY WITH PROPOFOL N/A 04/13/2018   Procedure: COLONOSCOPY WITH PROPOFOL;  Surgeon: Manya Silvas, MD;  Location: [facility] ENDOSCOPY;  Service: Endoscopy;  Laterality: N/A;  . ESOPHAGOGASTRODUODENOSCOPY (EGD) WITH PROPOFOL N/A 02/23/2015   Procedure: ESOPHAGOGASTRODUODENOSCOPY (EGD) WITH PROPOFOL;  Surgeon: Manya Silvas, MD;  Location: [facility] ENDOSCOPY;  Service: Endoscopy;  Laterality: N/A;  . ESOPHAGOGASTRODUODENOSCOPY (EGD) WITH PROPOFOL N/A 04/13/2018   Procedure: ESOPHAGOGASTRODUODENOSCOPY (EGD) WITH PROPOFOL;  Surgeon: Manya Silvas, MD;  Location: [facility] ENDOSCOPY;  Service: Endoscopy;  Laterality: N/A;  . HIP CLOSED REDUCTION Left 08/08/2015   Procedure: CLOSED REDUCTION HIP;  Surgeon: Dereck Leep, MD;  Location: [facility] ORS;  Service: Orthopedics;  Laterality: Left;  . HIP CLOSED REDUCTION Left 09/18/2015   Procedure: CLOSED REDUCTION HIP;  Surgeon: Hessie Knows, MD;  Location: [facility] ORS;  Service: Orthopedics;  Laterality: Left;  . HIP CLOSED REDUCTION Left 12/01/2015   Procedure: CLOSED REDUCTION HIP;   Surgeon: Dereck Leep, MD;  Location: [facility];  Service: Orthopedics;  Laterality: Left;  no prep no incision  . JOINT REPLACEMENT  10/2016   left knee  . KNEE ARTHROPLASTY Left 10/28/2016   Procedure: COMPUTER ASSISTED TOTAL KNEE ARTHROPLASTY;  Surgeon: Dereck Leep, MD;  Location: [facility];  Service: Orthopedics;  Laterality: Left;  . KNEE ARTHROPLASTY Right 07/07/2017   Procedure: COMPUTER ASSISTED TOTAL KNEE ARTHROPLASTY;  Surgeon: Dereck Leep, MD;  Location: [facility];  Service: Orthopedics;  Laterality: Right;  . KNEE ARTHROSCOPY Right 11/14/2014   Procedure: ARTHROSCOPY KNEE;  Surgeon: Dereck Leep, MD;  Location: [facility];  Service: Orthopedics;  Laterality: Right;  Posterior horn menicus, anterior lateral menicus grade 3 chondromalacia  . phlebitis groin    . right foot surgery    . SHOULDER ARTHROSCOPY WITH OPEN ROTATOR CUFF REPAIR Left 03/10/2019   Procedure: LEFT SHOULDER ARTHROSCOPY, ARTHROSCOPIC ROTATOR CUFF REPAIR, DISTAL CLAVICLE EXCISION, SUBACROMIAL DECOMPRESSION, INTRA-ARTICULAR DEBRIDEMENT ;  Surgeon: Lovell Sheehan, MD;  Location: [facility];  Service: Orthopedics;  Laterality: Left;  . SHOULDER ARTHROSCOPY WITH ROTATOR CUFF REPAIR Left 06/21/2019   Procedure: SHOULDER ARTHROSCOPY WITH EXTENSIVE INTRAARTICULAR DEBRIDEMENT WITH REMOVAL OF HARDWARE;  Surgeon: Lovell Sheehan, MD;  Location: [place];  Service: Orthopedics;  Laterality: Left;  . TONSILLECTOMY    . TOTAL HIP ARTHROPLASTY Left 05/08/11   necrosis of hip  . TOTAL HIP ARTHROPLASTY Right 06/14/2015   Procedure: TOTAL HIP ARTHROPLASTY;  Surgeon: Dereck Leep, MD;  Location: [facility];  Service: Orthopedics;  Laterality: Right;  .  TOTAL HIP REVISION Left 02/21/2016   Procedure: TOTAL HIP REVISION;  Surgeon: Dereck Leep, MD;  Location: [facility];  Service: Orthopedics;  Laterality: Left;    Prior to Admission medications   Medication Sig Start Date End Date Taking? Authorizing Provider   acetaminophen (TYLENOL) 500 MG tablet Take 1,000 mg by mouth every 6 (six) hours as needed.   Yes [provider]  atenolol (TENORMIN) 50 MG tablet daily. 01/12/19  Yes [provider]  Biotin 5000 MCG TABS Take 10,000 mcg by mouth daily.   Yes [provider]  Calcium Carbonate-Vitamin D (CALTRATE 600+D PO) Take by mouth 2 (two) times daily.   Yes [provider]  Cholecalciferol (VITAMIN D-3 SL) Place 5,000 Units under the tongue.   Yes [provider]  clindamycin (CLEOCIN) 300 MG capsule Take 300 mg by mouth daily.   Yes [provider]  Coenzyme Q10 (CO Q-10) 200 MG CAPS Take 100 mg by mouth daily.   Yes [provider]  diclofenac Sodium (VOLTAREN) 1 % GEL diclofenac 1 % topical gel   Yes [provider]  Glucosamine HCl-MSM 1500-500 MG/30ML LIQD Take by mouth 2 (two) times daily.   Yes [provider]  ibuprofen (ADVIL) 200 MG tablet Take 400 mg by mouth every 6 (six) hours as needed.   Yes [provider]  Melatonin 10 MG TABS Take 10 mg by mouth at bedtime.   Yes [provider]  Menthol, Topical Analgesic, (BIOFREEZE EX) Apply 1 application topically daily as needed (foot pain).   Yes [provider]  multivitamin-lutein (OCUVITE-LUTEIN) CAPS capsule Take 1 capsule by mouth daily.   Yes [provider]  omeprazole (PRILOSEC) 20 MG capsule Take by mouth. 11/10/18  Yes [provider]  pramipexole (MIRAPEX) 1.5 MG tablet Take 1.5 mg by mouth at bedtime.    Yes [provider]  Red Yeast Rice 600 MG TABS Take 600 mg by mouth 2 (two) times daily.   Yes [provider]  traZODone (DESYREL) 50 MG tablet trazodone 50 mg tablet 11/02/19  Yes [provider]  Turmeric 400 MG CAPS Take by mouth daily.   Yes [provider]    Allergies as of 06/23/2020 - Review Complete 06/07/2020  Allergen Reaction Noted  . Penicillins Hives and Other  (See Comments) 11/09/2012  . Erythromycin Rash 11/02/2014  . Other Nausea Only 12/24/2013  . Tetracyclines & related Rash 11/09/2012    Family History  Problem Relation Age of Onset  . Diabetes Mother   . Osteoarthritis Mother   . Diabetes Maternal Grandfather   . Hypertension Maternal Grandfather   . Ovarian cancer Paternal Aunt   . Cancer - Lung Paternal Uncle     Social History   Socioeconomic History  . Marital status: Married    Spouse name: Not on file  . Number of children: Not on file  . Years of education: Not on file  . Highest education level: Not on file  Occupational History  . Not on file  Tobacco Use  . Smoking status: Never Smoker  . Smokeless tobacco: Never Used  Vaping Use  . Vaping Use: Never used  Substance and Sexual Activity  . Alcohol use: No  . Drug use: No  . Sexual activity: Yes    Partners: Male    Birth control/protection: Post-menopausal    Comment: husband vasectomy  Other Topics Concern  . Not on file  Social History Narrative  . Not  on file   Social Determinants of Health   Financial Resource Strain: Not on file  Food Insecurity: Not on file  Transportation Needs: Not on file  Physical Activity: Not on file  Stress: Not on file  Social Connections: Not on file  Intimate Partner Violence: Not on file    Review of Systems: See HPI, otherwise negative ROS  Physical Exam: BP 137/78   Pulse 65   Temp (!) 97.4 F (36.3 C) (Temporal)   Ht 5\' 4"  (1.626 m)   Wt 84.4 kg   LMP 02/08/2005 (Approximate)   SpO2 99%   BMI 31.93 kg/m  General:   Alert,  pleasant and cooperative in NAD Head:  Normocephalic and atraumatic. Respiratory:  Normal work of breathing.  Impression/Plan: Dorris Fetch Commins is here for cataract surgery.  Risks, benefits, limitations, and alternatives regarding cataract surgery have been reviewed with the patient.  Questions have been answered.  All parties agreeable.   Birder Robson, MD  08/29/2020,  9:15 AM

## 2020-08-29 NOTE — Transfer of Care (Signed)
Immediate Anesthesia Transfer of Care Note  Patient: Claudia Terry  Procedure(s) Performed: CATARACT EXTRACTION PHACO AND INTRAOCULAR LENS PLACEMENT (IOC) RIGHT 6.94 00:42.3 (Right Eye)  Patient Location: PACU  Anesthesia Type: MAC  Level of Consciousness: awake, alert  and patient cooperative  Airway and Oxygen Therapy: Patient Spontanous Breathing and Patient connected to supplemental oxygen  Post-op Assessment: Post-op Vital signs reviewed, Patient's Cardiovascular Status Stable, Respiratory Function Stable, Patent Airway and No signs of Nausea or vomiting  Post-op Vital Signs: Reviewed and stable  Complications: No complications documented.

## 2020-08-30 ENCOUNTER — Encounter: Payer: Self-pay | Admitting: Ophthalmology

## 2020-09-07 ENCOUNTER — Ambulatory Visit: Payer: PPO | Admitting: Dermatology

## 2020-09-13 ENCOUNTER — Ambulatory Visit: Payer: PPO | Admitting: Dermatology

## 2020-09-26 ENCOUNTER — Other Ambulatory Visit: Payer: Self-pay

## 2020-09-26 ENCOUNTER — Ambulatory Visit: Payer: PPO | Admitting: Dermatology

## 2020-09-26 ENCOUNTER — Encounter: Payer: Self-pay | Admitting: Dermatology

## 2020-09-26 DIAGNOSIS — B079 Viral wart, unspecified: Secondary | ICD-10-CM

## 2020-09-26 DIAGNOSIS — L821 Other seborrheic keratosis: Secondary | ICD-10-CM | POA: Diagnosis not present

## 2020-09-26 NOTE — Patient Instructions (Addendum)
  Cryotherapy Aftercare  . Wash gently with soap and water everyday.   Marland Kitchen Apply Vaseline and Band-Aid daily until healed.    Seborrheic Keratosis  What causes seborrheic keratoses? Seborrheic keratoses are harmless, common skin growths that first appear during adult life.  As time goes by, more growths appear.  Some people may develop a large number of them.  Seborrheic keratoses appear on both covered and uncovered body parts.  They are not caused by sunlight.  The tendency to develop seborrheic keratoses can be inherited.  They vary in color from skin-colored to gray, brown, or even black.  They can be either smooth or have a rough, warty surface.   Seborrheic keratoses are superficial and look as if they were stuck on the skin.  Under the microscope this type of keratosis looks like layers upon layers of skin.  That is why at times the top layer may seem to fall off, but the rest of the growth remains and re-grows.    Treatment Seborrheic keratoses do not need to be treated, but can easily be removed in the office.  Seborrheic keratoses often cause symptoms when they rub on clothing or jewelry.  Lesions can be in the way of shaving.  If they become inflamed, they can cause itching, soreness, or burning.  Removal of a seborrheic keratosis can be accomplished by freezing, burning, or surgery. If any spot bleeds, scabs, or grows rapidly, please return to have it checked, as these can be an indication of a skin cancer.   Recommend daily broad spectrum sunscreen SPF 30+ to sun-exposed areas, reapply every 2 hours as needed. Call for new or changing lesions.  Staying in the shade or wearing long sleeves, sun glasses (UVA+UVB protection) and wide brim hats (4-inch brim around the entire circumference of the hat) are also recommended for sun protection.

## 2020-09-26 NOTE — Progress Notes (Signed)
   Follow-Up Visit   Subjective  Claudia Terry is a 75 y.o. female who presents for the following: Follow-up (2 months f/u on wart on the left frontal scalp ). Pt c/o brown spots on her cheek she recently noticed. The spot at her left scalp is better but not clear.  The following portions of the chart were reviewed this encounter and updated as appropriate:   Tobacco  Allergies  Meds  Problems  Med Hx  Surg Hx  Fam Hx      Review of Systems:  No other skin or systemic complaints except as noted in HPI or Assessment and Plan.  Objective  Well appearing patient in no apparent distress; mood and affect are within normal limits.  A focused examination was performed including face,scalp . Relevant physical exam findings are noted in the Assessment and Plan.  Objective  left frontal scalp: Erythematous verrucous stuck-on papule or plaque.    Assessment & Plan  Verruca left frontal scalp  Within ISK  Discussed viral etiology and risk of spread.  Discussed multiple treatments may be required to clear warts.  Discussed possible post-treatment dyspigmentation and risk of recurrence.   Prior to procedure, discussed risks of blister formation, small wound, skin dyspigmentation, or rare scar following cryotherapy.    Destruction of lesion - left frontal scalp Complexity: simple   Destruction method: cryotherapy   Informed consent: discussed and consent obtained   Timeout:  patient name, date of birth, surgical site, and procedure verified Lesion destroyed using liquid nitrogen: Yes   Region frozen until ice ball extended beyond lesion: Yes   Outcome: patient tolerated procedure well with no complications   Post-procedure details: wound care instructions given    Seborrheic Keratoses Face/cheeks - Stuck-on, waxy, tan-brown papules and/or plaques  - Benign-appearing - Discussed benign etiology and prognosis. - Observe - Call for any changes   Return in about 1 month  (around 10/26/2020) for  1-2 months ISK/WART .  I, Marye Round, CMA, am acting as scribe for Forest Gleason, MD .  Documentation: I have reviewed the above documentation for accuracy and completeness, and I agree with the above.  Forest Gleason, MD

## 2020-10-31 ENCOUNTER — Encounter: Payer: Self-pay | Admitting: Dermatology

## 2020-10-31 ENCOUNTER — Ambulatory Visit: Payer: PPO | Admitting: Dermatology

## 2020-10-31 ENCOUNTER — Other Ambulatory Visit: Payer: Self-pay

## 2020-10-31 DIAGNOSIS — L578 Other skin changes due to chronic exposure to nonionizing radiation: Secondary | ICD-10-CM | POA: Diagnosis not present

## 2020-10-31 DIAGNOSIS — L821 Other seborrheic keratosis: Secondary | ICD-10-CM | POA: Diagnosis not present

## 2020-10-31 DIAGNOSIS — L814 Other melanin hyperpigmentation: Secondary | ICD-10-CM

## 2020-10-31 DIAGNOSIS — B078 Other viral warts: Secondary | ICD-10-CM | POA: Diagnosis not present

## 2020-10-31 NOTE — Patient Instructions (Signed)
Recommend taking Heliocare sun protection supplement daily in sunny weather for additional sun protection. For maximum protection on the sunniest days, you can take up to 2 capsules of regular Heliocare OR take 1 capsule of Heliocare Ultra. For prolonged exposure (such as a full day in the sun), you can repeat your dose of the supplement 4 hours after your first dose. Heliocare can be purchased at [facility] or at [email or web address].   Recommend daily broad spectrum sunscreen SPF 30+ to sun-exposed areas, reapply every 2 hours as needed. Call for new or changing lesions.  Staying in the shade or wearing long sleeves, sun glasses (UVA+UVB protection) and wide brim hats (4-inch brim around the entire circumference of the hat) are also recommended for sun protection.   If you have any questions or concerns for your doctor, please call our main line at [phone] and press option 4 to reach your doctor's medical assistant. If no one answers, please leave a voicemail as directed and we will return your call as soon as possible. Messages left after 4 pm will be answered the following business day.   You may also send [place] a message via [place]. We typically respond to MyChart messages within 1-2 business days.  For prescription refills, please ask your pharmacy to contact our office. Our fax number is [phone].  If you have an urgent issue when the clinic is closed that cannot wait until the next business day, you can page your doctor at the number below.    Please note that while we do our best to be available for urgent issues outside of office hours, we are not available 24/7.   If you have an urgent issue and are unable to reach [place], you may choose to seek medical care at your doctor's office, retail clinic, urgent care center, or emergency room.  If you have a medical emergency, please immediately call 911 or go to the emergency department.  Pager Numbers  - Dr. Nehemiah Massed:  [phone]  - Dr. Laurence Ferrari: [phone]  - Dr. Nicole Kindred: [phone]  In the event of inclement weather, please call our main line at [phone] for an update on the status of any delays or closures.  Dermatology Medication Tips: Please keep the boxes that topical medications come in in order to help keep track of the instructions about where and how to use these. Pharmacies typically print the medication instructions only on the boxes and not directly on the medication tubes.   If your medication is too expensive, please contact our office at [phone] option 4 or send [place] a message through [place].   We are unable to tell what your co-pay for medications will be in advance as this is different depending on your insurance coverage. However, we may be able to find a substitute medication at lower cost or fill out paperwork to get insurance to cover a needed medication.   If a prior authorization is required to get your medication covered by your insurance company, please allow [place] 1-2 business days to complete this process.  Drug prices often vary depending on where the prescription is filled and some pharmacies may offer cheaper prices.  The website www.goodrx.com contains coupons for medications through different pharmacies. The prices here do not account for what the cost may be with help from insurance (it may be cheaper with your insurance), but the website can give you the price if you did not use any insurance.  - You can print the associated coupon  and take it with your prescription to the pharmacy.  - You may also stop by our office during regular business hours and pick up a GoodRx coupon card.  - If you need your prescription sent electronically to a different pharmacy, notify our office through [facility] or by phone at [phone] option 4.

## 2020-10-31 NOTE — Progress Notes (Signed)
   Follow-Up Visit   Subjective  Claudia Terry is a 75 y.o. female who presents for the following: Follow-up (Patient here today for 1 month ISK/Verruca follow up at left frontal scalp.  Previously treated with LN2.).  Doing much better and no longer bothersome.  The following portions of the chart were reviewed this encounter and updated as appropriate:   Tobacco  Allergies  Meds  Problems  Med Hx  Surg Hx  Fam Hx      Review of Systems:  No other skin or systemic complaints except as noted in HPI or Assessment and Plan.  Objective  Well appearing patient in no apparent distress; mood and affect are within normal limits.  A focused examination was performed including scalp. Relevant physical exam findings are noted in the Assessment and Plan.  Objective  left frontal scalp: clear   Assessment & Plan  Other viral warts left frontal scalp  Resolved   Actinic Damage - chronic, secondary to cumulative UV radiation exposure/sun exposure over time - diffuse scaly erythematous macules with underlying dyspigmentation - Recommend daily broad spectrum sunscreen SPF 30+ to sun-exposed areas, reapply every 2 hours as needed.  - Recommend staying in the shade or wearing long sleeves, sun glasses (UVA+UVB protection) and wide brim hats (4-inch brim around the entire circumference of the hat). - Call for new or changing lesions. - Recommend taking Heliocare sun protection supplement daily in sunny weather for additional sun protection. For maximum protection on the sunniest days, you can take up to 2 capsules of regular Heliocare OR take 1 capsule of Heliocare Ultra. For prolonged exposure (such as a full day in the sun), you can repeat your dose of the supplement 4 hours after your first dose.   Seborrheic Keratoses - Stuck-on, waxy, tan-brown papules and/or plaques  - Benign-appearing - Discussed benign etiology and prognosis. - Observe - Call for any changes  Lentigines -  Scattered tan macules - Due to sun exposure - Benign-appering, observe - Recommend daily broad spectrum sunscreen SPF 30+ to sun-exposed areas, reapply every 2 hours as needed. - Call for any changes   Return if symptoms worsen or fail to improve.  Graciella Belton, RMA, am acting as scribe for Forest Gleason, MD .  Documentation: I have reviewed the above documentation for accuracy and completeness, and I agree with the above.  Forest Gleason, MD

## 2020-11-27 DIAGNOSIS — M9902 Segmental and somatic dysfunction of thoracic region: Secondary | ICD-10-CM | POA: Diagnosis not present

## 2020-11-27 DIAGNOSIS — M5384 Other specified dorsopathies, thoracic region: Secondary | ICD-10-CM | POA: Diagnosis not present

## 2020-11-27 DIAGNOSIS — M50322 Other cervical disc degeneration at C5-C6 level: Secondary | ICD-10-CM | POA: Diagnosis not present

## 2020-11-27 DIAGNOSIS — M9901 Segmental and somatic dysfunction of cervical region: Secondary | ICD-10-CM | POA: Diagnosis not present

## 2020-11-29 DIAGNOSIS — E7800 Pure hypercholesterolemia, unspecified: Secondary | ICD-10-CM | POA: Diagnosis not present

## 2020-11-29 DIAGNOSIS — R739 Hyperglycemia, unspecified: Secondary | ICD-10-CM | POA: Diagnosis not present

## 2020-11-29 DIAGNOSIS — Z79899 Other long term (current) drug therapy: Secondary | ICD-10-CM | POA: Diagnosis not present

## 2020-11-30 DIAGNOSIS — M5384 Other specified dorsopathies, thoracic region: Secondary | ICD-10-CM | POA: Diagnosis not present

## 2020-11-30 DIAGNOSIS — M50322 Other cervical disc degeneration at C5-C6 level: Secondary | ICD-10-CM | POA: Diagnosis not present

## 2020-11-30 DIAGNOSIS — M9901 Segmental and somatic dysfunction of cervical region: Secondary | ICD-10-CM | POA: Diagnosis not present

## 2020-11-30 DIAGNOSIS — M9902 Segmental and somatic dysfunction of thoracic region: Secondary | ICD-10-CM | POA: Diagnosis not present

## 2020-12-07 DIAGNOSIS — M9902 Segmental and somatic dysfunction of thoracic region: Secondary | ICD-10-CM | POA: Diagnosis not present

## 2020-12-07 DIAGNOSIS — M50322 Other cervical disc degeneration at C5-C6 level: Secondary | ICD-10-CM | POA: Diagnosis not present

## 2020-12-07 DIAGNOSIS — M5384 Other specified dorsopathies, thoracic region: Secondary | ICD-10-CM | POA: Diagnosis not present

## 2020-12-07 DIAGNOSIS — M9901 Segmental and somatic dysfunction of cervical region: Secondary | ICD-10-CM | POA: Diagnosis not present

## 2020-12-12 DIAGNOSIS — Z1331 Encounter for screening for depression: Secondary | ICD-10-CM | POA: Diagnosis not present

## 2020-12-12 DIAGNOSIS — M9901 Segmental and somatic dysfunction of cervical region: Secondary | ICD-10-CM | POA: Diagnosis not present

## 2020-12-12 DIAGNOSIS — Z0000 Encounter for general adult medical examination without abnormal findings: Secondary | ICD-10-CM | POA: Diagnosis not present

## 2020-12-12 DIAGNOSIS — M5384 Other specified dorsopathies, thoracic region: Secondary | ICD-10-CM | POA: Diagnosis not present

## 2020-12-12 DIAGNOSIS — M9902 Segmental and somatic dysfunction of thoracic region: Secondary | ICD-10-CM | POA: Diagnosis not present

## 2020-12-12 DIAGNOSIS — M50322 Other cervical disc degeneration at C5-C6 level: Secondary | ICD-10-CM | POA: Diagnosis not present

## 2020-12-13 DIAGNOSIS — M5384 Other specified dorsopathies, thoracic region: Secondary | ICD-10-CM | POA: Diagnosis not present

## 2020-12-13 DIAGNOSIS — M9902 Segmental and somatic dysfunction of thoracic region: Secondary | ICD-10-CM | POA: Diagnosis not present

## 2020-12-13 DIAGNOSIS — M50322 Other cervical disc degeneration at C5-C6 level: Secondary | ICD-10-CM | POA: Diagnosis not present

## 2020-12-13 DIAGNOSIS — M9901 Segmental and somatic dysfunction of cervical region: Secondary | ICD-10-CM | POA: Diagnosis not present

## 2020-12-18 DIAGNOSIS — G5751 Tarsal tunnel syndrome, right lower limb: Secondary | ICD-10-CM | POA: Diagnosis not present

## 2020-12-18 DIAGNOSIS — M24274 Disorder of ligament, right foot: Secondary | ICD-10-CM | POA: Diagnosis not present

## 2020-12-18 DIAGNOSIS — M7751 Other enthesopathy of right foot: Secondary | ICD-10-CM | POA: Diagnosis not present

## 2020-12-21 DIAGNOSIS — M9902 Segmental and somatic dysfunction of thoracic region: Secondary | ICD-10-CM | POA: Diagnosis not present

## 2020-12-21 DIAGNOSIS — M50322 Other cervical disc degeneration at C5-C6 level: Secondary | ICD-10-CM | POA: Diagnosis not present

## 2020-12-21 DIAGNOSIS — M9901 Segmental and somatic dysfunction of cervical region: Secondary | ICD-10-CM | POA: Diagnosis not present

## 2020-12-21 DIAGNOSIS — M5384 Other specified dorsopathies, thoracic region: Secondary | ICD-10-CM | POA: Diagnosis not present

## 2020-12-25 DIAGNOSIS — M9902 Segmental and somatic dysfunction of thoracic region: Secondary | ICD-10-CM | POA: Diagnosis not present

## 2020-12-25 DIAGNOSIS — M5384 Other specified dorsopathies, thoracic region: Secondary | ICD-10-CM | POA: Diagnosis not present

## 2020-12-25 DIAGNOSIS — M50322 Other cervical disc degeneration at C5-C6 level: Secondary | ICD-10-CM | POA: Diagnosis not present

## 2020-12-25 DIAGNOSIS — M9901 Segmental and somatic dysfunction of cervical region: Secondary | ICD-10-CM | POA: Diagnosis not present

## 2020-12-26 DIAGNOSIS — M5384 Other specified dorsopathies, thoracic region: Secondary | ICD-10-CM | POA: Diagnosis not present

## 2020-12-26 DIAGNOSIS — M9902 Segmental and somatic dysfunction of thoracic region: Secondary | ICD-10-CM | POA: Diagnosis not present

## 2020-12-26 DIAGNOSIS — M50322 Other cervical disc degeneration at C5-C6 level: Secondary | ICD-10-CM | POA: Diagnosis not present

## 2020-12-26 DIAGNOSIS — M9901 Segmental and somatic dysfunction of cervical region: Secondary | ICD-10-CM | POA: Diagnosis not present

## 2020-12-28 DIAGNOSIS — M50322 Other cervical disc degeneration at C5-C6 level: Secondary | ICD-10-CM | POA: Diagnosis not present

## 2020-12-28 DIAGNOSIS — M9901 Segmental and somatic dysfunction of cervical region: Secondary | ICD-10-CM | POA: Diagnosis not present

## 2020-12-28 DIAGNOSIS — M5384 Other specified dorsopathies, thoracic region: Secondary | ICD-10-CM | POA: Diagnosis not present

## 2020-12-28 DIAGNOSIS — M9902 Segmental and somatic dysfunction of thoracic region: Secondary | ICD-10-CM | POA: Diagnosis not present

## 2021-01-01 DIAGNOSIS — M9901 Segmental and somatic dysfunction of cervical region: Secondary | ICD-10-CM | POA: Diagnosis not present

## 2021-01-01 DIAGNOSIS — M50322 Other cervical disc degeneration at C5-C6 level: Secondary | ICD-10-CM | POA: Diagnosis not present

## 2021-01-01 DIAGNOSIS — M5384 Other specified dorsopathies, thoracic region: Secondary | ICD-10-CM | POA: Diagnosis not present

## 2021-01-01 DIAGNOSIS — M9902 Segmental and somatic dysfunction of thoracic region: Secondary | ICD-10-CM | POA: Diagnosis not present

## 2021-01-02 DIAGNOSIS — M9901 Segmental and somatic dysfunction of cervical region: Secondary | ICD-10-CM | POA: Diagnosis not present

## 2021-01-02 DIAGNOSIS — M9902 Segmental and somatic dysfunction of thoracic region: Secondary | ICD-10-CM | POA: Diagnosis not present

## 2021-01-02 DIAGNOSIS — M5384 Other specified dorsopathies, thoracic region: Secondary | ICD-10-CM | POA: Diagnosis not present

## 2021-01-02 DIAGNOSIS — M50322 Other cervical disc degeneration at C5-C6 level: Secondary | ICD-10-CM | POA: Diagnosis not present

## 2021-01-04 DIAGNOSIS — M50322 Other cervical disc degeneration at C5-C6 level: Secondary | ICD-10-CM | POA: Diagnosis not present

## 2021-01-04 DIAGNOSIS — M5384 Other specified dorsopathies, thoracic region: Secondary | ICD-10-CM | POA: Diagnosis not present

## 2021-01-04 DIAGNOSIS — M9902 Segmental and somatic dysfunction of thoracic region: Secondary | ICD-10-CM | POA: Diagnosis not present

## 2021-01-04 DIAGNOSIS — M9901 Segmental and somatic dysfunction of cervical region: Secondary | ICD-10-CM | POA: Diagnosis not present

## 2021-01-12 DIAGNOSIS — S9304XA Dislocation of right ankle joint, initial encounter: Secondary | ICD-10-CM | POA: Diagnosis not present

## 2021-01-12 DIAGNOSIS — M24474 Recurrent dislocation, right foot: Secondary | ICD-10-CM | POA: Diagnosis not present

## 2021-01-12 DIAGNOSIS — T84293A Other mechanical complication of internal fixation device of bones of foot and toes, initial encounter: Secondary | ICD-10-CM | POA: Diagnosis not present

## 2021-01-15 DIAGNOSIS — Z8616 Personal history of COVID-19: Secondary | ICD-10-CM

## 2021-01-15 DIAGNOSIS — Z20822 Contact with and (suspected) exposure to covid-19: Secondary | ICD-10-CM | POA: Diagnosis not present

## 2021-01-15 DIAGNOSIS — U071 COVID-19: Secondary | ICD-10-CM | POA: Diagnosis not present

## 2021-01-15 HISTORY — DX: Personal history of COVID-19: Z86.16

## 2021-01-20 ENCOUNTER — Encounter: Payer: Self-pay | Admitting: Emergency Medicine

## 2021-01-20 ENCOUNTER — Ambulatory Visit
Admission: EM | Admit: 2021-01-20 | Discharge: 2021-01-20 | Disposition: A | Discharge status: Home / Self Care | Payer: PPO | Attending: Internal Medicine | Admitting: Internal Medicine

## 2021-01-20 ENCOUNTER — Other Ambulatory Visit: Payer: Self-pay

## 2021-01-20 ENCOUNTER — Ambulatory Visit (INDEPENDENT_AMBULATORY_CARE_PROVIDER_SITE_OTHER): Payer: PPO

## 2021-01-20 DIAGNOSIS — W19XXXA Unspecified fall, initial encounter: Secondary | ICD-10-CM

## 2021-01-20 DIAGNOSIS — Z043 Encounter for examination and observation following other accident: Secondary | ICD-10-CM | POA: Diagnosis not present

## 2021-01-20 DIAGNOSIS — M79671 Pain in right foot: Secondary | ICD-10-CM

## 2021-01-20 DIAGNOSIS — M19071 Primary osteoarthritis, right ankle and foot: Secondary | ICD-10-CM | POA: Diagnosis not present

## 2021-01-20 NOTE — ED Provider Notes (Signed)
[facility]    CSN: [id number] Arrival date & time: 01/20/21  1308      History   Chief Complaint Chief Complaint  Patient presents with   Fall   Ankle Pain    HPI Claudia Terry is a 75 y.o. female.   Patient presents to the urgent care for further evaluation of postsurgical screw placement to right foot.  Patient is concerned that hardware may not be in place due to a fall that occurred approximately 5 days ago.  Had orthopedic surgery to right foot approximately 1 week ago in [place].  Patient states that she was trying to go to the bathroom with a walker and tripped and fell.  Patient is not sure how she landed or if she hit her head.  Denies any headache, blurred vision, dizziness, nausea, vomiting.  Denies any pain that is different from her postoperative pain in foot.  Denies any numbness and tingling in lower extremity.   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 Tiffany Kocher   Hypertension    Low back pain 11/95   Menopause    Osteoarthritis    Restless leg syndrome 1999    Patient Active Problem List   Diagnosis Date Noted   Generalized osteoarthritis of multiple sites 07/28/2018   Varicose veins of both legs with edema 07/28/2018   FH: colon polyps 02/10/2018   S/P total knee arthroplasty 07/07/2017   Status post total left knee replacement 10/28/2016   Failed total hip arthroplasty with dislocation ([place]) 12/01/2015   S/P closed reduction of dislocated total hip prosthesis 08/08/2015   S/P total hip arthroplasty 06/14/2015   Benign essential HTN 12/28/2013   Acid reflux 12/28/2013   HLD (hyperlipidemia) 12/28/2013   Restless leg 12/28/2013    Past Surgical History:  Procedure Laterality Date   CATARACT EXTRACTION W/PHACO Left 08/15/2020   Procedure: CATARACT EXTRACTION PHACO AND  INTRAOCULAR LENS PLACEMENT (IOC) LEFT 6.60 00:39.8;  Surgeon: Birder Robson, MD;  Location: [place];  Service: Ophthalmology;  Laterality: Left;   CATARACT EXTRACTION W/PHACO Right 08/29/2020   Procedure: CATARACT EXTRACTION PHACO AND INTRAOCULAR LENS PLACEMENT (IOC) RIGHT 6.94 00:42.3;  Surgeon: Birder Robson, MD;  Location: [place];  Service: Ophthalmology;  Laterality: Right;   [place]   X2   COLONOSCOPY  12/06/11   normal   COLONOSCOPY WITH PROPOFOL N/A 04/13/2018   Procedure: COLONOSCOPY WITH PROPOFOL;  Surgeon: Manya Silvas, MD;  Location: [facility] ENDOSCOPY;  Service: Endoscopy;  Laterality: N/A;   ESOPHAGOGASTRODUODENOSCOPY (EGD) WITH PROPOFOL N/A 02/23/2015   Procedure: ESOPHAGOGASTRODUODENOSCOPY (EGD) WITH PROPOFOL;  Surgeon: Manya Silvas, MD;  Location: [facility] ENDOSCOPY;  Service: Endoscopy;  Laterality: N/A;   ESOPHAGOGASTRODUODENOSCOPY (EGD) WITH PROPOFOL N/A 04/13/2018   Procedure: ESOPHAGOGASTRODUODENOSCOPY (EGD) WITH PROPOFOL;  Surgeon: Manya Silvas, MD;  Location: [facility] ENDOSCOPY;  Service: Endoscopy;  Laterality: N/A;   HIP CLOSED REDUCTION Left 08/08/2015   Procedure: CLOSED REDUCTION HIP;  Surgeon: Dereck Leep, MD;  Location: [facility];  Service: Orthopedics;  Laterality: Left;   HIP CLOSED REDUCTION Left 09/18/2015   Procedure: CLOSED REDUCTION HIP;  Surgeon: Hessie Knows, MD;  Location: [facility];  Service: Orthopedics;  Laterality: Left;   HIP CLOSED REDUCTION Left 12/01/2015   Procedure: CLOSED  REDUCTION HIP;  Surgeon: Dereck Leep, MD;  Location: [facility];  Service: Orthopedics;  Laterality: Left;  no prep no incision   JOINT REPLACEMENT  10/2016   left knee   KNEE ARTHROPLASTY Left 10/28/2016   Procedure: COMPUTER ASSISTED TOTAL KNEE ARTHROPLASTY;  Surgeon: Dereck Leep, MD;  Location: [facility];  Service: Orthopedics;  Laterality: Left;   KNEE ARTHROPLASTY Right 07/07/2017   Procedure: COMPUTER ASSISTED TOTAL  KNEE ARTHROPLASTY;  Surgeon: Dereck Leep, MD;  Location: [facility];  Service: Orthopedics;  Laterality: Right;   KNEE ARTHROSCOPY Right 11/14/2014   Procedure: ARTHROSCOPY KNEE;  Surgeon: Dereck Leep, MD;  Location: [facility];  Service: Orthopedics;  Laterality: Right;  Posterior horn menicus, anterior lateral menicus grade 3 chondromalacia   phlebitis groin     right foot surgery     SHOULDER ARTHROSCOPY WITH OPEN ROTATOR CUFF REPAIR Left 03/10/2019   Procedure: LEFT SHOULDER ARTHROSCOPY, ARTHROSCOPIC ROTATOR CUFF REPAIR, DISTAL CLAVICLE EXCISION, SUBACROMIAL DECOMPRESSION, INTRA-ARTICULAR DEBRIDEMENT ;  Surgeon: Lovell Sheehan, MD;  Location: [facility];  Service: Orthopedics;  Laterality: Left;   SHOULDER ARTHROSCOPY WITH ROTATOR CUFF REPAIR Left 06/21/2019   Procedure: SHOULDER ARTHROSCOPY WITH EXTENSIVE INTRAARTICULAR DEBRIDEMENT WITH REMOVAL OF HARDWARE;  Surgeon: Lovell Sheehan, MD;  Location: [place];  Service: Orthopedics;  Laterality: Left;   TONSILLECTOMY     TOTAL HIP ARTHROPLASTY Left 05/08/11   necrosis of hip   TOTAL HIP ARTHROPLASTY Right 06/14/2015   Procedure: TOTAL HIP ARTHROPLASTY;  Surgeon: Dereck Leep, MD;  Location: [facility];  Service: Orthopedics;  Laterality: Right;   TOTAL HIP REVISION Left 02/21/2016   Procedure: TOTAL HIP REVISION;  Surgeon: Dereck Leep, MD;  Location: [facility];  Service: Orthopedics;  Laterality: Left;    OB History     Gravida  2   Para  2   Term  2   Preterm  0   AB  0   Living  2      SAB  0   IAB  0   Ectopic  0   Multiple  0   Live Births  2            Home Medications    Prior to Admission medications   Medication Sig Start Date End Date Taking? Authorizing Provider  acetaminophen (TYLENOL) 500 MG tablet Take 1,000 mg by mouth every 6 (six) hours as needed.    [provider]  atenolol (TENORMIN) 50 MG tablet daily. 01/12/19   [provider]  Biotin 5000 MCG TABS Take 10,000  mcg by mouth daily.    [provider]  Calcium Carbonate-Vitamin D (CALTRATE 600+D PO) Take by mouth 2 (two) times daily.    [provider]  Cholecalciferol (VITAMIN D-3 SL) Place 5,000 Units under the tongue.    [provider]  clindamycin (CLEOCIN) 300 MG capsule Take 300 mg by mouth daily.    [provider]  Coenzyme Q10 (CO Q-10) 200 MG CAPS Take 100 mg by mouth daily.    [provider]  diclofenac Sodium (VOLTAREN) 1 % GEL diclofenac 1 % topical gel    [provider]  Glucosamine HCl-MSM 1500-500 MG/30ML LIQD Take by mouth 2 (two) times daily.    [provider]  ibuprofen (ADVIL) 200 MG tablet Take 400 mg by mouth every 6 (six) hours as needed.    [provider]  Melatonin 10 MG TABS Take 10 mg by mouth  at bedtime.    [provider]  Menthol, Topical Analgesic, (BIOFREEZE EX) Apply 1 application topically daily as needed (foot pain).    [provider]  multivitamin-lutein (OCUVITE-LUTEIN) CAPS capsule Take 1 capsule by mouth daily.    [provider]  omeprazole (PRILOSEC) 20 MG capsule Take by mouth. 11/10/18   [provider]  pramipexole (MIRAPEX) 1.5 MG tablet Take 1.5 mg by mouth at bedtime.     [provider]  Red Yeast Rice 600 MG TABS Take 600 mg by mouth 2 (two) times daily.    [provider]  traZODone (DESYREL) 50 MG tablet trazodone 50 mg tablet 11/02/19   [provider]  Turmeric 400 MG CAPS Take by mouth daily.    [provider]    Family History Family History  Problem Relation Age of Onset   Diabetes Mother    Osteoarthritis Mother    Diabetes Maternal Grandfather    Hypertension Maternal Grandfather    Ovarian cancer Paternal Aunt    Cancer - Lung Paternal Uncle     Social History Social History   Tobacco Use   Smoking status: Never   Smokeless tobacco: Never  Vaping Use   Vaping Use: Never used  Substance  Use Topics   Alcohol use: No   Drug use: No     Allergies   Penicillins, Erythromycin, Other, and Tetracyclines & related   Review of Systems Review of Systems Per HPI  Physical Exam Triage Vital Signs ED Triage Vitals  Enc Vitals Group     BP 01/20/21 1443 (!) 154/83     Pulse Rate 01/20/21 1443 99     Resp 01/20/21 1443 18     Temp 01/20/21 1443 98 F (36.7 C)     Temp Source 01/20/21 1443 Oral     SpO2 01/20/21 1443 95 %     Weight --      Height --      Head Circumference --      Peak Flow --      Pain Score 01/20/21 1444 6     Pain Loc --      Pain Edu? --      Excl. in [place]? --    No data found.  Updated Vital Signs BP (!) 154/83 (BP Location: Left Arm)   Pulse 99   Temp 98 F (36.7 C) (Oral)   Resp 18   LMP 02/08/2005 (Approximate)   SpO2 95%   Visual Acuity Right Eye Distance:   Left Eye Distance:   Bilateral Distance:    Right Eye Near:   Left Eye Near:    Bilateral Near:     Physical Exam Constitutional:      Appearance: Normal appearance.  HENT:     Head: Normocephalic and atraumatic.  Eyes:     Extraocular Movements: Extraocular movements intact.     Conjunctiva/sclera: Conjunctivae normal.  Pulmonary:     Effort: Pulmonary effort is normal.  Feet:     Comments: No tenderness to palpation to right dorsal surface of foot and near surgical site.  Patient has stitches in place to lateral dorsal surface of right foot from surgery that was approximately 1 week ago.  No signs of infection or deformity.  Neurovascular intact. Neurological:     General: No focal deficit present.     Mental Status: She is alert and oriented to person, place, and time. Mental status is at baseline.  Psychiatric:  Mood and Affect: Mood normal.        Behavior: Behavior normal.        Thought Content: Thought content normal.        Judgment: Judgment normal.     UC Treatments / Results  Labs (all labs ordered are listed, but only abnormal results are  displayed) Labs Reviewed - No data to display  EKG   Radiology DG Foot Complete Right  Result Date: 01/20/2021 CLINICAL DATA:  Fall EXAM: RIGHT FOOT COMPLETE - 3+ VIEW COMPARISON:  06/03/2020 FINDINGS: No evidence of acute fracture or dislocation. Degenerative changes of the right foot most pronounced at the first MTP joint have slightly progressed from the prior. Small plantar calcaneal spur. Surgical hardware within the region of the sinus tarsi. Bones appear demineralized. No focal soft tissue swelling. IMPRESSION: 1. No acute osseous abnormality, right foot. 2. Slightly progressed degenerative changes, most pronounced at the first MTP joint. Electronically Signed   By: Davina Poke D.O.   On: 01/20/2021 15:48    Procedures Procedures (including critical care time)  Medications Ordered in UC Medications - No data to display  Initial Impression / Assessment and Plan / UC Course  I have reviewed the triage vital signs and the nursing notes.  Pertinent labs & imaging results that were available during my care of the patient were reviewed by me and considered in my medical decision making (see chart for details).     Right foot x-ray showing that surgical hardware is within the region of the sinus Tarsi.  Unable to determine if this was the prior location of the surgical hardware as there is no foot imaging available for comparison.  Advised patient to follow-up with orthopedist as soon as possible for further evaluation and management.  Patient has access to MyChart and will discuss results with orthopedist.  Discussed strict return precautions. Patient verbalized understanding and is agreeable with plan.  Final Clinical Impressions(s) / UC Diagnoses   Final diagnoses:  Fall, initial encounter  Right foot pain     Discharge Instructions      Please follow-up with orthopedist for further evaluation and management.     ED Prescriptions   None    PDMP not reviewed this  encounter.   Odis Luster, [place] 01/20/21 [phone]

## 2021-01-20 NOTE — Discharge Instructions (Addendum)
Please follow-up with orthopedist for further evaluation and management. 

## 2021-01-20 NOTE — ED Triage Notes (Signed)
Pt had screw replaced in right ankle last Friday and then had fall 2 days ago; unsure if hit her foot or not but would like to have screw checked

## 2021-01-31 DIAGNOSIS — R350 Frequency of micturition: Secondary | ICD-10-CM | POA: Diagnosis not present

## 2021-01-31 DIAGNOSIS — R300 Dysuria: Secondary | ICD-10-CM | POA: Diagnosis not present

## 2021-03-05 DIAGNOSIS — M9901 Segmental and somatic dysfunction of cervical region: Secondary | ICD-10-CM | POA: Diagnosis not present

## 2021-03-05 DIAGNOSIS — M5384 Other specified dorsopathies, thoracic region: Secondary | ICD-10-CM | POA: Diagnosis not present

## 2021-03-05 DIAGNOSIS — M9902 Segmental and somatic dysfunction of thoracic region: Secondary | ICD-10-CM | POA: Diagnosis not present

## 2021-03-05 DIAGNOSIS — M50322 Other cervical disc degeneration at C5-C6 level: Secondary | ICD-10-CM | POA: Diagnosis not present

## 2021-03-08 DIAGNOSIS — M5384 Other specified dorsopathies, thoracic region: Secondary | ICD-10-CM | POA: Diagnosis not present

## 2021-03-08 DIAGNOSIS — M9901 Segmental and somatic dysfunction of cervical region: Secondary | ICD-10-CM | POA: Diagnosis not present

## 2021-03-08 DIAGNOSIS — M50322 Other cervical disc degeneration at C5-C6 level: Secondary | ICD-10-CM | POA: Diagnosis not present

## 2021-03-08 DIAGNOSIS — M9902 Segmental and somatic dysfunction of thoracic region: Secondary | ICD-10-CM | POA: Diagnosis not present

## 2021-03-12 DIAGNOSIS — M9902 Segmental and somatic dysfunction of thoracic region: Secondary | ICD-10-CM | POA: Diagnosis not present

## 2021-03-12 DIAGNOSIS — M5384 Other specified dorsopathies, thoracic region: Secondary | ICD-10-CM | POA: Diagnosis not present

## 2021-03-12 DIAGNOSIS — M50322 Other cervical disc degeneration at C5-C6 level: Secondary | ICD-10-CM | POA: Diagnosis not present

## 2021-03-12 DIAGNOSIS — M9901 Segmental and somatic dysfunction of cervical region: Secondary | ICD-10-CM | POA: Diagnosis not present

## 2021-03-15 DIAGNOSIS — M9901 Segmental and somatic dysfunction of cervical region: Secondary | ICD-10-CM | POA: Diagnosis not present

## 2021-03-15 DIAGNOSIS — M5384 Other specified dorsopathies, thoracic region: Secondary | ICD-10-CM | POA: Diagnosis not present

## 2021-03-15 DIAGNOSIS — M50322 Other cervical disc degeneration at C5-C6 level: Secondary | ICD-10-CM | POA: Diagnosis not present

## 2021-03-15 DIAGNOSIS — Z96641 Presence of right artificial hip joint: Secondary | ICD-10-CM | POA: Diagnosis not present

## 2021-03-15 DIAGNOSIS — M9902 Segmental and somatic dysfunction of thoracic region: Secondary | ICD-10-CM | POA: Diagnosis not present

## 2021-03-15 DIAGNOSIS — Z96653 Presence of artificial knee joint, bilateral: Secondary | ICD-10-CM | POA: Diagnosis not present

## 2021-03-19 DIAGNOSIS — M50322 Other cervical disc degeneration at C5-C6 level: Secondary | ICD-10-CM | POA: Diagnosis not present

## 2021-03-19 DIAGNOSIS — M5384 Other specified dorsopathies, thoracic region: Secondary | ICD-10-CM | POA: Diagnosis not present

## 2021-03-19 DIAGNOSIS — M9902 Segmental and somatic dysfunction of thoracic region: Secondary | ICD-10-CM | POA: Diagnosis not present

## 2021-03-19 DIAGNOSIS — M9901 Segmental and somatic dysfunction of cervical region: Secondary | ICD-10-CM | POA: Diagnosis not present

## 2021-03-20 DIAGNOSIS — R739 Hyperglycemia, unspecified: Secondary | ICD-10-CM | POA: Diagnosis not present

## 2021-03-20 DIAGNOSIS — G2581 Restless legs syndrome: Secondary | ICD-10-CM | POA: Diagnosis not present

## 2021-03-20 DIAGNOSIS — E7800 Pure hypercholesterolemia, unspecified: Secondary | ICD-10-CM | POA: Diagnosis not present

## 2021-03-20 DIAGNOSIS — Z79899 Other long term (current) drug therapy: Secondary | ICD-10-CM | POA: Diagnosis not present

## 2021-03-20 DIAGNOSIS — F320 Major depressive disorder, single episode, mild: Secondary | ICD-10-CM | POA: Diagnosis not present

## 2021-03-20 DIAGNOSIS — Z23 Encounter for immunization: Secondary | ICD-10-CM | POA: Diagnosis not present

## 2021-03-25 DIAGNOSIS — M79671 Pain in right foot: Secondary | ICD-10-CM | POA: Diagnosis not present

## 2021-04-02 DIAGNOSIS — M9902 Segmental and somatic dysfunction of thoracic region: Secondary | ICD-10-CM | POA: Diagnosis not present

## 2021-04-02 DIAGNOSIS — M5384 Other specified dorsopathies, thoracic region: Secondary | ICD-10-CM | POA: Diagnosis not present

## 2021-04-02 DIAGNOSIS — M9901 Segmental and somatic dysfunction of cervical region: Secondary | ICD-10-CM | POA: Diagnosis not present

## 2021-04-02 DIAGNOSIS — M50322 Other cervical disc degeneration at C5-C6 level: Secondary | ICD-10-CM | POA: Diagnosis not present

## 2021-04-03 DIAGNOSIS — M5032 Other cervical disc degeneration, mid-cervical region, unspecified level: Secondary | ICD-10-CM | POA: Diagnosis not present

## 2021-04-19 ENCOUNTER — Encounter: Payer: Self-pay | Admitting: Obstetrics and Gynecology

## 2021-04-19 ENCOUNTER — Other Ambulatory Visit: Payer: Self-pay

## 2021-04-19 ENCOUNTER — Ambulatory Visit (INDEPENDENT_AMBULATORY_CARE_PROVIDER_SITE_OTHER): Payer: PPO | Admitting: Obstetrics and Gynecology

## 2021-04-19 ENCOUNTER — Other Ambulatory Visit (HOSPITAL_COMMUNITY)
Admission: RE | Admit: 2021-04-19 | Discharge: 2021-04-19 | Disposition: A | Discharge status: Home / Self Care | Payer: PPO | Source: Ambulatory Visit | Attending: Obstetrics and Gynecology | Admitting: Obstetrics and Gynecology

## 2021-04-19 VITALS — BP 124/70 | HR 71 | Ht 62.25 in | Wt 195.0 lb

## 2021-04-19 DIAGNOSIS — Z124 Encounter for screening for malignant neoplasm of cervix: Secondary | ICD-10-CM | POA: Insufficient documentation

## 2021-04-19 DIAGNOSIS — Z01419 Encounter for gynecological examination (general) (routine) without abnormal findings: Secondary | ICD-10-CM

## 2021-04-19 NOTE — Progress Notes (Signed)
75 y.o. G64P2002 Married Caucasian female here for annual exam.    Up twice to void at night.   Patient had foot surgeries and cataract surgery this year.   Mother passed in April, 2022.  PCP:  Derinda Late, MD  Patient's last menstrual period was 02/08/2005 (approximate).           Sexually active: Yes.    The current method of family planning is vasectomy.    Exercising: Yes.     Walking and some water aerobics Smoker:  no  Health Maintenance: Pap:  03/04/18 - pap normal History of abnormal Pap:  no MMG: 05-11-20 Neg/Birads1.  Has appt 05/17/21.  Colonoscopy: 04-13-18 normal;next 10 years BMD:  11-13-11  Result : t-score -0.05 normal TDaP:  12-28-13 Gardasil:   no HIV:no Hep C: Screening Labs:  PCP   reports that she has never smoked. She has never used smokeless tobacco. She reports that she does not drink alcohol and does not use drugs.  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 Tiffany Kocher   Hypertension    Low back pain 11/95   Menopause    Osteoarthritis    Restless leg syndrome 1999    Past Surgical History:  Procedure Laterality Date   CATARACT EXTRACTION     CATARACT EXTRACTION W/PHACO Left 08/15/2020   Procedure: CATARACT EXTRACTION PHACO AND INTRAOCULAR LENS PLACEMENT (IOC) LEFT 6.60 00:39.8;  Surgeon: Birder Robson, MD;  Location: [place];  Service: Ophthalmology;  Laterality: Left;   CATARACT EXTRACTION W/PHACO Right 08/29/2020   Procedure: CATARACT EXTRACTION PHACO AND INTRAOCULAR LENS PLACEMENT (IOC) RIGHT 6.94 00:42.3;  Surgeon: Birder Robson, MD;  Location: [place];  Service: Ophthalmology;  Laterality: Right;   [place]   X2   COLONOSCOPY  12/06/2011   normal   COLONOSCOPY WITH PROPOFOL N/A 04/13/2018   Procedure: COLONOSCOPY WITH PROPOFOL;  Surgeon: Manya Silvas, MD;  Location: [facility] ENDOSCOPY;  Service: Endoscopy;  Laterality: N/A;   ESOPHAGOGASTRODUODENOSCOPY (EGD) WITH PROPOFOL N/A 02/23/2015   Procedure: ESOPHAGOGASTRODUODENOSCOPY (EGD) WITH PROPOFOL;  Surgeon: Manya Silvas, MD;  Location: [facility] ENDOSCOPY;  Service: Endoscopy;  Laterality: N/A;   ESOPHAGOGASTRODUODENOSCOPY (EGD) WITH PROPOFOL N/A 04/13/2018   Procedure: ESOPHAGOGASTRODUODENOSCOPY (EGD) WITH PROPOFOL;  Surgeon: Manya Silvas, MD;  Location: [facility] ENDOSCOPY;  Service: Endoscopy;  Laterality: N/A;   FOOT SURGERY     with screws   HIP CLOSED REDUCTION Left 08/08/2015   Procedure: CLOSED REDUCTION HIP;  Surgeon: Dereck Leep, MD;  Location: [facility];  Service: Orthopedics;  Laterality: Left;   HIP CLOSED REDUCTION Left 09/18/2015   Procedure: CLOSED REDUCTION HIP;  Surgeon: Hessie Knows, MD;  Location: [facility];  Service: Orthopedics;  Laterality: Left;   HIP CLOSED REDUCTION Left 12/01/2015   Procedure: CLOSED REDUCTION HIP;  Surgeon: Dereck Leep, MD;  Location: [facility];  Service: Orthopedics;  Laterality: Left;  no prep no incision   JOINT REPLACEMENT  10/2016   left knee   KNEE ARTHROPLASTY Left 10/28/2016   Procedure: COMPUTER ASSISTED TOTAL KNEE ARTHROPLASTY;  Surgeon: Dereck Leep, MD;  Location: [facility];  Service: Orthopedics;  Laterality: Left;   KNEE ARTHROPLASTY Right 07/07/2017   Procedure: COMPUTER ASSISTED TOTAL KNEE ARTHROPLASTY;  Surgeon: Dereck Leep, MD;  Location: [facility];  Service: Orthopedics;  Laterality: Right;   KNEE ARTHROSCOPY Right 11/14/2014   Procedure: ARTHROSCOPY KNEE;  Surgeon: Dereck Leep, MD;  Location: [facility];  Service: Orthopedics;  Laterality: Right;  Posterior horn menicus, anterior lateral menicus grade 3 chondromalacia   phlebitis groin     right foot surgery     SHOULDER ARTHROSCOPY WITH OPEN ROTATOR CUFF REPAIR Left 03/10/2019   Procedure: LEFT SHOULDER ARTHROSCOPY, ARTHROSCOPIC ROTATOR CUFF REPAIR, DISTAL CLAVICLE  EXCISION, SUBACROMIAL DECOMPRESSION, INTRA-ARTICULAR DEBRIDEMENT ;  Surgeon: Lovell Sheehan, MD;  Location: [facility];  Service: Orthopedics;  Laterality: Left;   SHOULDER ARTHROSCOPY WITH ROTATOR CUFF REPAIR Left 06/21/2019   Procedure: SHOULDER ARTHROSCOPY WITH EXTENSIVE INTRAARTICULAR DEBRIDEMENT WITH REMOVAL OF HARDWARE;  Surgeon: Lovell Sheehan, MD;  Location: [place];  Service: Orthopedics;  Laterality: Left;   TONSILLECTOMY     TOTAL HIP ARTHROPLASTY Left 05/08/2011   necrosis of hip   TOTAL HIP ARTHROPLASTY Right 06/14/2015   Procedure: TOTAL HIP ARTHROPLASTY;  Surgeon: Dereck Leep, MD;  Location: [facility];  Service: Orthopedics;  Laterality: Right;   TOTAL HIP REVISION Left 02/21/2016   Procedure: TOTAL HIP REVISION;  Surgeon: Dereck Leep, MD;  Location: [facility];  Service: Orthopedics;  Laterality: Left;    Current Outpatient Medications  Medication Sig Dispense Refill   acetaminophen (TYLENOL) 500 MG tablet Take 1,000 mg by mouth every 6 (six) hours as needed.     atenolol (TENORMIN) 50 MG tablet daily.     Biotin 5000 MCG TABS Take 10,000 mcg by mouth daily.     Calcium Carbonate-Vitamin D (CALTRATE 600+D PO) Take by mouth 2 (two) times daily.     Cholecalciferol (VITAMIN D-3 SL) Place 5,000 Units under the tongue.     clindamycin (CLEOCIN) 300 MG capsule Take 300 mg by mouth daily.     Coenzyme Q10 (CO Q-10) 200 MG CAPS Take 100 mg by mouth daily.     diclofenac Sodium (VOLTAREN) 1 % GEL diclofenac 1 % topical gel     Glucosamine HCl-MSM 1500-500 MG/30ML LIQD Take by mouth 2 (two) times daily.     ibuprofen (ADVIL) 200 MG tablet Take 400 mg by mouth every 6 (six) hours as needed.     Melatonin 10 MG TABS Take 10 mg by mouth at bedtime.     Menthol, Topical Analgesic, (BIOFREEZE EX) Apply 1 application topically daily as needed (foot pain).     multivitamin-lutein (OCUVITE-LUTEIN) CAPS capsule Take 1 capsule by mouth daily.     omeprazole (PRILOSEC) 20 MG  capsule Take by mouth.     pramipexole (MIRAPEX) 1.5 MG tablet Take 1.5 mg by mouth at bedtime.      Red Yeast Rice 600 MG TABS Take 600 mg by mouth 2 (two) times daily.     traZODone (DESYREL) 50 MG tablet trazodone 50 mg tablet     Turmeric 400 MG CAPS Take by mouth daily.     venlafaxine XR (EFFEXOR-XR) 150 MG 24 hr capsule Take by mouth.     No current facility-administered medications for this visit.    Family History  Problem Relation Age of Onset   Diabetes Mother    Osteoarthritis Mother    Diabetes Maternal Grandfather    Hypertension Maternal Grandfather    Ovarian cancer Paternal Aunt    Cancer - Lung Paternal Uncle     Review of Systems  All other systems reviewed and are negative.  Exam:   BP 124/70  Pulse 71   Ht 5' 2.25" (1.581 m)   Wt 195 lb (88.5 kg)   LMP 02/08/2005 (Approximate)   SpO2 98%   BMI 35.38 kg/m     General appearance: alert, cooperative and appears stated age Head: normocephalic, without obvious abnormality, atraumatic Neck: no adenopathy, supple, symmetrical, trachea midline and thyroid normal to inspection and palpation Lungs: clear to auscultation bilaterally Breasts: normal appearance, no masses or tenderness, No nipple retraction or dimpling, No nipple discharge or bleeding, No axillary adenopathy Heart: regular rate and rhythm Abdomen: soft, non-tender; no masses, no organomegaly Extremities: extremities normal, atraumatic, no cyanosis or edema Skin: skin color, texture, turgor normal. No rashes or lesions Lymph nodes: cervical, supraclavicular, and axillary nodes normal. Neurologic: grossly normal  Pelvic: External genitalia:  no lesions              No abnormal inguinal nodes palpated.              Urethra:  normal appearing urethra with no masses, tenderness or lesions              Bartholins and Skenes: normal                 Vagina: normal appearing vagina with normal color and discharge, no lesions              Cervix: no  lesions              Pap taken: yes. Bimanual Exam:  Uterus:  normal size, contour, position, consistency, mobility, non-tender              Adnexa: no mass, fullness, tenderness              Rectal exam: yes.  Confirms.              Anus:  normal sphincter tone, no lesions  Chaperone was present for exam:  Estill Bamberg, CMA  Assessment:   Well woman visit with gynecologic exam. Cervical cancer screening.   Plan: Mammogram screening discussed. Self breast awareness reviewed. Pap and HR HPV as above. Guidelines for Calcium, Vitamin D, regular exercise program including cardiovascular and weight bearing exercise. FU in 2 years and prn.   After visit summary provided.

## 2021-04-19 NOTE — Patient Instructions (Signed)

## 2021-04-20 LAB — CYTOLOGY - PAP: Diagnosis: NEGATIVE

## 2021-05-10 DIAGNOSIS — H40003 Preglaucoma, unspecified, bilateral: Secondary | ICD-10-CM | POA: Diagnosis not present

## 2021-05-17 DIAGNOSIS — Z1231 Encounter for screening mammogram for malignant neoplasm of breast: Secondary | ICD-10-CM | POA: Diagnosis not present

## 2021-05-28 DIAGNOSIS — L298 Other pruritus: Secondary | ICD-10-CM | POA: Diagnosis not present

## 2021-05-31 DIAGNOSIS — M25552 Pain in left hip: Secondary | ICD-10-CM | POA: Diagnosis not present

## 2021-05-31 DIAGNOSIS — Z96642 Presence of left artificial hip joint: Secondary | ICD-10-CM | POA: Diagnosis not present

## 2021-06-18 ENCOUNTER — Ambulatory Visit: Payer: PPO | Admitting: Dermatology

## 2021-06-18 ENCOUNTER — Other Ambulatory Visit: Payer: Self-pay

## 2021-06-18 DIAGNOSIS — R21 Rash and other nonspecific skin eruption: Secondary | ICD-10-CM | POA: Diagnosis not present

## 2021-06-18 DIAGNOSIS — S30860A Insect bite (nonvenomous) of lower back and pelvis, initial encounter: Secondary | ICD-10-CM | POA: Diagnosis not present

## 2021-06-18 MED ORDER — CLOBETASOL PROPIONATE 0.05 % EX CREA: TOPICAL_CREAM | CUTANEOUS | 0 refills | Status: DC

## 2021-06-18 NOTE — Progress Notes (Signed)
° °  Follow-Up Visit   Subjective  Claudia Terry is a 76 y.o. female who presents for the following: Rash  (Possible bite reaction - start last week very itchy and swollen on the back/buttocks/lower legs. Patient currently using [facility]MC 0.1% cream 3-4 times per day PRN itch which does seem to help some. Husband doesn't have any bites and they sleep together in the same bed. ). Patient hasn't been traveling recently nor does she have pets in the home. 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 She did wash those items in bleach and wear gloves.   The following portions of the chart were reviewed this encounter and updated as appropriate:   Tobacco   Allergies   Meds   Problems   Med Hx   Surg Hx   Fam Hx      Review of Systems:  No other skin or systemic complaints except as noted in HPI or Assessment and Plan.  Objective  Well appearing patient in no apparent distress; mood and affect are within normal limits.  A focused examination was performed including the back, buttocks, and legs. Relevant physical exam findings are noted in the Assessment and Plan.  sacral/buttocks crease, B/L ankle Pink oval macules with central small 0.1 cm crusts. Similar on the lower legs.       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  Advised patient to consider contacting an exterminator to have her home evaluated and treated. Recommend Buggybeds Bed Bug Glue Traps to see if any are bedbugs other other mites present in the home. Discussed with patient bed bugs are very difficult to get rid of.   Start Clobetasol cream to aa's QD-BID PRN.  Topical steroids (such as triamcinolone, fluocinolone, fluocinonide, mometasone, clobetasol,  halobetasol, betamethasone, hydrocortisone) can cause thinning and lightening of the skin if they are used for too long in the same area. Your physician has selected the right strength medicine for your problem and area affected on the body. Please use your medication only as directed by your physician to prevent side effects.   Start Allegra or Claritin QAM.   clobetasol cream (TEMOVATE) 0.05 % - sacral/buttocks crease, B/L ankle Apply to itchy bites QD-BID. Avoid the face, groin, and axilla.  Return if symptoms worsen or fail to improve.  Luther Redo, CMA, am acting as scribe for Sarina Ser, MD . Documentation: I have reviewed the above documentation for accuracy and completeness, and I agree with the above.  Sarina Ser, MD

## 2021-06-18 NOTE — Patient Instructions (Addendum)
If You Need Anything After Your Visit ° °If you have any questions or concerns for your doctor, please call our main line at [phone] and press option 4 to reach your doctor's medical assistant. If no one answers, please leave a voicemail as directed and we will return your call as soon as possible. Messages left after 4 pm will be answered the following business day.  ° °You may also send us a message via MyChart. We typically respond to MyChart messages within 1-2 business days. ° °For prescription refills, please ask your pharmacy to contact our office. Our fax number is [phone]. ° °If you have an urgent issue when the clinic is closed that cannot wait until the next business day, you can page your doctor at the number below.   ° °Please note that while we do our best to be available for urgent issues outside of office hours, we are not available 24/7.  ° °If you have an urgent issue and are unable to reach us, you may choose to seek medical care at your doctor's office, retail clinic, urgent care center, or emergency room. ° °If you have a medical emergency, please immediately call 911 or go to the emergency department. ° °Pager Numbers ° °- Dr. Kowalski: [phone] ° °- Dr. Moye: [phone] ° °- Dr. Stewart: [phone] ° °In the event of inclement weather, please call our main line at [phone] for an update on the status of any delays or closures. ° °Dermatology Medication Tips: °Please keep the boxes that topical medications come in in order to help keep track of the instructions about where and how to use these. Pharmacies typically print the medication instructions only on the boxes and not directly on the medication tubes.  ° °If your medication is too expensive, please contact our office at [phone] option 4 or send us a message through MyChart.  ° °We are unable to tell what your co-pay for medications will be in advance as this is different depending on your insurance coverage.  However, we may be able to find a substitute medication at lower cost or fill out paperwork to get insurance to cover a needed medication.  ° °If a prior authorization is required to get your medication covered by your insurance company, please allow us 1-2 business days to complete this process. ° °Drug prices often vary depending on where the prescription is filled and some pharmacies may offer cheaper prices. ° °The website www.goodrx.com contains coupons for medications through different pharmacies. The prices here do not account for what the cost may be with help from insurance (it may be cheaper with your insurance), but the website can give you the price if you did not use any insurance.  °- You can print the associated coupon and take it with your prescription to the pharmacy.  °- You may also stop by our office during regular business hours and pick up a GoodRx coupon card.  °- If you need your prescription sent electronically to a different pharmacy, notify our office through [facility] MyChart or by phone at [phone] option 4. ° ° ° ° °Si Usted Necesita Algo Después de Su Visita ° °También puede enviarnos un mensaje a través de MyChart. Por lo general respondemos a los mensajes de MyChart en el transcurso de 1 a 2 días hábiles. ° °Para renovar recetas, por favor pida a su farmacia que se ponga en contacto con nuestra oficina. Nuestro número de fax es el [phone]. ° °Si tiene   un asunto urgente cuando la clnica est cerrada y que no puede esperar hasta el siguiente da hbil, puede llamar/localizar a su doctor(a) al nmero que aparece a continuacin.   Por favor, tenga en cuenta que aunque hacemos todo lo posible para estar disponibles para asuntos urgentes fuera del horario de [place], no estamos disponibles las 24 horas del da, los 7 das de la [place].   Si tiene un problema urgente y no puede comunicarse con nosotros, puede optar por buscar atencin mdica  en el consultorio de su  doctor(a), en una clnica privada, en un centro de atencin urgente o en una sala de emergencias.  Si tiene Engineering geologist, por favor llame inmediatamente al 911 o vaya a la sala de emergencias.  Nmeros de bper  - Dr. Nehemiah Massed: [phone]  - Dra. Moye: [phone]  - Dra. Nicole Kindred: [phone]  En caso de inclemencias del [place], por favor llame a Johnsie Kindred principal al [phone] para una actualizacin sobre el [place] de cualquier retraso o cierre.  Consejos para la medicacin en dermatologa: Por favor, guarde las cajas en las que vienen los medicamentos de uso tpico para ayudarle a seguir las instrucciones sobre dnde y cmo usarlos. Las farmacias generalmente imprimen las instrucciones del medicamento slo en las cajas y no directamente en los tubos del [place].   Si su medicamento es muy caro, por favor, pngase en contacto con Zigmund Daniel llamando al [phone] y presione la opcin 4 o envenos un mensaje a travs de Pharmacist, community.   No podemos decirle cul ser su copago por los medicamentos por adelantado ya que esto es diferente dependiendo de la cobertura de su seguro. Sin embargo, es posible que podamos encontrar un medicamento sustituto a Electrical engineer un formulario para que el seguro cubra el medicamento que se considera necesario.   Si se requiere una autorizacin previa para que su compaa de seguros [place] su medicamento, por favor permtanos de 1 a 2 das hbiles para completar este proceso.  Los precios de los medicamentos varan con frecuencia dependiendo del Environmental consultant de dnde se surte la receta y alguna farmacias pueden ofrecer precios ms baratos.  El sitio web www.goodrx.com tiene cupones para medicamentos de Airline pilot. Los precios aqu no tienen en cuenta lo que podra costar con la ayuda del seguro (puede ser ms barato con su seguro), pero el sitio web puede darle el precio si no utiliz Research scientist (physical sciences).  - Puede imprimir el cupn  correspondiente y llevarlo con su receta a la farmacia.  - Tambin puede pasar por nuestra oficina durante el horario de atencin regular y Charity fundraiser una tarjeta de cupones de GoodRx.  - Si necesita que su receta se enve electrnicamente a una farmacia diferente, informe a nuestra oficina a travs de MyChart de [place] o por telfono llamando al [phone] y presione la opcin 4.  Recommend Buggybeds Bed Bug Glue Traps.  Start Claritin or Allegra once daily in the morning.

## 2021-06-19 ENCOUNTER — Encounter: Payer: Self-pay | Admitting: Dermatology

## 2021-06-20 ENCOUNTER — Telehealth: Payer: Self-pay

## 2021-06-20 NOTE — Telephone Encounter (Signed)
Patient called regarding bug bites seen at [facility] appointment. She states she has had some spreading. Patient states the cream has helped little but she is in a lot of pain. She is asking can you prescribe something for her pain to help and give her some relief?

## 2021-06-21 NOTE — Telephone Encounter (Signed)
Spoke with patient this morning and she currently is better this morning and will use topical cream as suggested by Dr. Nehemiah Massed. aw

## 2021-07-11 DIAGNOSIS — R739 Hyperglycemia, unspecified: Secondary | ICD-10-CM | POA: Diagnosis not present

## 2021-07-11 DIAGNOSIS — E7800 Pure hypercholesterolemia, unspecified: Secondary | ICD-10-CM | POA: Diagnosis not present

## 2021-07-11 DIAGNOSIS — Z79899 Other long term (current) drug therapy: Secondary | ICD-10-CM | POA: Diagnosis not present

## 2021-07-17 DIAGNOSIS — M79673 Pain in unspecified foot: Secondary | ICD-10-CM | POA: Diagnosis not present

## 2021-07-17 DIAGNOSIS — Z79899 Other long term (current) drug therapy: Secondary | ICD-10-CM | POA: Diagnosis not present

## 2021-07-17 DIAGNOSIS — F320 Major depressive disorder, single episode, mild: Secondary | ICD-10-CM | POA: Diagnosis not present

## 2021-07-17 DIAGNOSIS — G2581 Restless legs syndrome: Secondary | ICD-10-CM | POA: Diagnosis not present

## 2021-07-17 DIAGNOSIS — G8929 Other chronic pain: Secondary | ICD-10-CM | POA: Diagnosis not present

## 2021-07-17 DIAGNOSIS — R739 Hyperglycemia, unspecified: Secondary | ICD-10-CM | POA: Diagnosis not present

## 2021-07-17 DIAGNOSIS — E7800 Pure hypercholesterolemia, unspecified: Secondary | ICD-10-CM | POA: Diagnosis not present

## 2021-07-17 DIAGNOSIS — I10 Essential (primary) hypertension: Secondary | ICD-10-CM | POA: Diagnosis not present

## 2021-08-02 ENCOUNTER — Telehealth: Payer: Self-pay

## 2021-08-02 NOTE — Telephone Encounter (Signed)
Pt called c/o possible bug bite reaction on her arms,  Scheduled pt with Dr Nicole Kindred Monday Feb 27 at 2 pm, work in spot for bug bite reaction only at this visit

## 2021-08-06 ENCOUNTER — Other Ambulatory Visit: Payer: Self-pay

## 2021-08-06 ENCOUNTER — Ambulatory Visit: Payer: PPO | Admitting: Dermatology

## 2021-08-06 DIAGNOSIS — W57XXXD Bitten or stung by nonvenomous insect and other nonvenomous arthropods, subsequent encounter: Secondary | ICD-10-CM | POA: Diagnosis not present

## 2021-08-06 DIAGNOSIS — S40862A Insect bite (nonvenomous) of left upper arm, initial encounter: Secondary | ICD-10-CM | POA: Diagnosis not present

## 2021-08-06 NOTE — Patient Instructions (Signed)

## 2021-08-06 NOTE — Progress Notes (Signed)
° °  Follow-Up Visit   Subjective  Claudia Terry is a 76 y.o. female who presents for the following: hx of bed bugs (Borax soap on mattress and clothes washed in clorox, Sprayed mattress 2 times with bed bug spray, arms, new bites on L arm last Thursday, Taking Claritin qam, using Clobetasol cr bid).  Pt had bug she found on Pjs taken to state extension lab and looked at under microscope and identified as a bedbug.   The following portions of the chart were reviewed this encounter and updated as appropriate:       Review of Systems:  No other skin or systemic complaints except as noted in HPI or Assessment and Plan.  Objective  Well appearing patient in no apparent distress; mood and affect are within normal limits.  A focused examination was performed including arms. Relevant physical exam findings are noted in the Assessment and Plan.  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  Wash on hot and dry on high all bedding/pajamas  Cont Claritin qam Cont Clobetasol cr qd/bid aa bites until clear   Return if symptoms worsen or fail to improve.  I, Othelia Pulling, RMA, am acting as scribe for Brendolyn Patty, MD .  Documentation: I have reviewed the above documentation for accuracy and completeness, and I agree with the above.  Brendolyn Patty MD

## 2021-09-07 DIAGNOSIS — M216X1 Other acquired deformities of right foot: Secondary | ICD-10-CM | POA: Diagnosis not present

## 2021-09-07 DIAGNOSIS — M19071 Primary osteoarthritis, right ankle and foot: Secondary | ICD-10-CM | POA: Diagnosis not present

## 2021-09-07 DIAGNOSIS — M216X2 Other acquired deformities of left foot: Secondary | ICD-10-CM | POA: Diagnosis not present

## 2021-09-07 DIAGNOSIS — M2042 Other hammer toe(s) (acquired), left foot: Secondary | ICD-10-CM | POA: Diagnosis not present

## 2021-09-07 DIAGNOSIS — M2142 Flat foot [pes planus] (acquired), left foot: Secondary | ICD-10-CM | POA: Diagnosis not present

## 2021-09-07 DIAGNOSIS — M810 Age-related osteoporosis without current pathological fracture: Secondary | ICD-10-CM | POA: Diagnosis not present

## 2021-09-07 DIAGNOSIS — M79671 Pain in right foot: Secondary | ICD-10-CM | POA: Diagnosis not present

## 2021-09-07 DIAGNOSIS — M76821 Posterior tibial tendinitis, right leg: Secondary | ICD-10-CM | POA: Diagnosis not present

## 2021-09-07 DIAGNOSIS — M2041 Other hammer toe(s) (acquired), right foot: Secondary | ICD-10-CM | POA: Diagnosis not present

## 2021-09-07 DIAGNOSIS — M2141 Flat foot [pes planus] (acquired), right foot: Secondary | ICD-10-CM | POA: Diagnosis not present

## 2021-09-18 DIAGNOSIS — M79671 Pain in right foot: Secondary | ICD-10-CM | POA: Diagnosis not present

## 2021-09-18 DIAGNOSIS — M19071 Primary osteoarthritis, right ankle and foot: Secondary | ICD-10-CM | POA: Diagnosis not present

## 2021-09-18 DIAGNOSIS — Z9889 Other specified postprocedural states: Secondary | ICD-10-CM | POA: Diagnosis not present

## 2021-09-18 DIAGNOSIS — M25871 Other specified joint disorders, right ankle and foot: Secondary | ICD-10-CM | POA: Diagnosis not present

## 2021-09-24 DIAGNOSIS — M810 Age-related osteoporosis without current pathological fracture: Secondary | ICD-10-CM | POA: Diagnosis not present

## 2021-09-25 DIAGNOSIS — R1314 Dysphagia, pharyngoesophageal phase: Secondary | ICD-10-CM | POA: Diagnosis not present

## 2021-09-25 DIAGNOSIS — K219 Gastro-esophageal reflux disease without esophagitis: Secondary | ICD-10-CM | POA: Diagnosis not present

## 2021-09-27 ENCOUNTER — Other Ambulatory Visit: Payer: Self-pay | Admitting: Otolaryngology

## 2021-09-27 DIAGNOSIS — R1319 Other dysphagia: Secondary | ICD-10-CM

## 2021-09-27 DIAGNOSIS — K219 Gastro-esophageal reflux disease without esophagitis: Secondary | ICD-10-CM

## 2021-10-04 ENCOUNTER — Ambulatory Visit
Admission: RE | Admit: 2021-10-04 | Discharge: 2021-10-04 | Disposition: A | Discharge status: Home / Self Care | Payer: PPO | Source: Ambulatory Visit | Attending: Otolaryngology | Admitting: Otolaryngology

## 2021-10-04 DIAGNOSIS — K219 Gastro-esophageal reflux disease without esophagitis: Secondary | ICD-10-CM | POA: Diagnosis not present

## 2021-10-04 DIAGNOSIS — R1319 Other dysphagia: Secondary | ICD-10-CM | POA: Diagnosis not present

## 2021-10-04 DIAGNOSIS — R1310 Dysphagia, unspecified: Secondary | ICD-10-CM | POA: Diagnosis not present

## 2021-10-04 NOTE — Therapy (Addendum)
[facility] DIAGNOSTIC RADIOLOGY [address] 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 Markham Jordan   Hypertension    Low back pain 11/95   Menopause    Osteoarthritis    Restless leg syndrome 1999    Past Surgical History:  Procedure Laterality Date   CATARACT EXTRACTION     CATARACT EXTRACTION W/PHACO Left 08/15/2020   Procedure: CATARACT EXTRACTION PHACO AND INTRAOCULAR LENS PLACEMENT (IOC) LEFT 6.60 00:39.8;  Surgeon: Galen Manila, MD;  Location: [facility];  Service: Ophthalmology;  Laterality: Left;   CATARACT EXTRACTION W/PHACO Right 08/29/2020   Procedure: CATARACT EXTRACTION PHACO AND INTRAOCULAR LENS PLACEMENT (IOC) RIGHT 6.94 00:42.3;  Surgeon: Galen Manila, MD;  Location: [facility];  Service: Ophthalmology;  Laterality: Right;   CESAREAN SECTION  1972, 1975   X2   COLONOSCOPY  12/06/2011   normal   COLONOSCOPY WITH PROPOFOL N/A 04/13/2018   Procedure: COLONOSCOPY WITH PROPOFOL;  Surgeon: Scot Jun, MD;  Location: [facility];  Service: Endoscopy;  Laterality: N/A;   ESOPHAGOGASTRODUODENOSCOPY (EGD) WITH PROPOFOL N/A 02/23/2015   Procedure: ESOPHAGOGASTRODUODENOSCOPY (EGD) WITH PROPOFOL;  Surgeon: Scot Jun, MD;  Location: [facility];  Service: Endoscopy;  Laterality: N/A;    ESOPHAGOGASTRODUODENOSCOPY (EGD) WITH PROPOFOL N/A 04/13/2018   Procedure: ESOPHAGOGASTRODUODENOSCOPY (EGD) WITH PROPOFOL;  Surgeon: Scot Jun, MD;  Location: [facility];  Service: Endoscopy;  Laterality: N/A;   FOOT SURGERY     with screws   HIP CLOSED REDUCTION Left 08/08/2015   Procedure: CLOSED REDUCTION HIP;  Surgeon: Donato Heinz, MD;  Location: [facility];  Service: Orthopedics;  Laterality: Left;   HIP CLOSED REDUCTION Left 09/18/2015   Procedure: CLOSED REDUCTION HIP;  Surgeon: Kennedy Bucker, MD;  Location: [facility];  Service: Orthopedics;  Laterality: Left;   HIP CLOSED REDUCTION Left 12/01/2015   Procedure: CLOSED REDUCTION HIP;  Surgeon: Donato Heinz, MD;  Location: [facility];  Service: Orthopedics;  Laterality: Left;  no prep no incision   JOINT REPLACEMENT  10/2016   left knee   KNEE ARTHROPLASTY Left 10/28/2016   Procedure: COMPUTER ASSISTED TOTAL KNEE ARTHROPLASTY;  Surgeon: Donato Heinz, MD;  Location: [facility];  Service: Orthopedics;  Laterality: Left;   KNEE ARTHROPLASTY Right 07/07/2017   Procedure: COMPUTER ASSISTED TOTAL KNEE ARTHROPLASTY;  Surgeon: Donato Heinz, MD;  Location: [facility];  Service: Orthopedics;  Laterality: Right;   KNEE ARTHROSCOPY Right 11/14/2014   Procedure: ARTHROSCOPY KNEE;  Surgeon: Donato Heinz, MD;  Location: [facility];  Service: Orthopedics;  Laterality: Right;  Posterior horn menicus, anterior lateral menicus grade 3 chondromalacia   phlebitis groin  right foot surgery     SHOULDER ARTHROSCOPY WITH OPEN ROTATOR CUFF REPAIR Left 03/10/2019   Procedure: LEFT SHOULDER ARTHROSCOPY, ARTHROSCOPIC ROTATOR CUFF REPAIR, DISTAL CLAVICLE EXCISION, SUBACROMIAL DECOMPRESSION, INTRA-ARTICULAR DEBRIDEMENT ;  Surgeon: Lyndle Herrlich, MD;  Location: [facility];  Service: Orthopedics;  Laterality: Left;   SHOULDER ARTHROSCOPY WITH ROTATOR CUFF REPAIR Left 06/21/2019   Procedure: SHOULDER ARTHROSCOPY WITH EXTENSIVE INTRAARTICULAR  DEBRIDEMENT WITH REMOVAL OF HARDWARE;  Surgeon: Lyndle Herrlich, MD;  Location: [facility];  Service: Orthopedics;  Laterality: Left;   TONSILLECTOMY     TOTAL HIP ARTHROPLASTY Left 05/08/2011   necrosis of hip   TOTAL HIP ARTHROPLASTY Right 06/14/2015   Procedure: TOTAL HIP ARTHROPLASTY;  Surgeon: Donato Heinz, MD;  Location: [facility];  Service: Orthopedics;  Laterality: Right;   TOTAL HIP REVISION Left 02/21/2016   Procedure: TOTAL HIP REVISION;  Surgeon: Donato Heinz, MD;  Location: [facility];  Service: Orthopedics;  Laterality: Left;    There were no vitals filed for this visit.   Subjective: Patient behavior: (alertness, ability to follow instructions, etc.): pt A/O x4. Followed all instructions appropriately. Chief complaint: dysphagia. Pt c/o a "full" feeling and food/Pills "getting hung" in her throat as she pointed to her sternal notch area. She endorsed this was intermittent w/ no difficulty swallowing liquids. She also endorsed a Baseline of GERD/REFLUX; currently on a PPI of 20mg  2x daily. H/o Esophagitis per chart notes. OF CONCERN, pt endorsed episodes of a "burning sensation" from REFLUX during the night; she also described having increased, clear Phlegm in addition to the Globus sensations.  Pt denied any pneumonia nor tx for upper airway infections. No h/o CVA or other documented neurological diseases.  OM exam appeared wfl. No unilateral weakness noted; speech clear.  Native Dentition.     Objective:  Radiological Procedure: A videoflouroscopic evaluation of oral-preparatory, reflex initiation, and pharyngeal phases of the swallow was performed; as well as a screening of the upper esophageal phase.  POSTURE: upright VIEW: lateral COMPENSATORY STRATEGIES: none BOLUSES ADMINISTERED:  Thin Liquid: 2 via tsp; 5 via Cup  Nectar-thick Liquid: 1 via tsp; 3 via Cup  Honey-thick Liquid: NT  Puree: 1 trial  Mechanical Soft: 1 trial  Barium Tablet w/ Puree: 1  trial RESULTS OF EVALUATION: ORAL PREPARATORY PHASE: (The lips, tongue, and velum are observed for strength and coordination)       **Overall Severity Rating: WFL.  SWALLOW INITIATION/REFLEX: (The reflex is normal if "triggered" by the time the bolus reached the base of the tongue)  **Overall Severity Rating: [facility].  PHARYNGEAL PHASE: (Pharyngeal function is normal if the bolus shows rapid, smooth, and continuous transit through the pharynx and there is no pharyngeal residue after the swallow)  **Overall Severity Rating: [facility].   LARYNGEAL PENETRATION: (Material entering into the laryngeal inlet/vestibule but not aspirated): NONE ASPIRATION: NONE ESOPHAGEAL PHASE: (Screening of the upper esophagus): appearance of prominent cricopharyngeus muscle w/ bulging protrusion just below the UES along the Cervical Esophageal wall.   ASSESSMENT: Pt presented w/ a functional oropharyngeal swallow w/ no oropharyngeal phase dysphagia noted; no neuromuscular deficits of swallowing. No aspiration nor penetration of po trials was noted to occur during this study. Noted was a prominent cricopharyngeus muscle w/ a bulging protrusion of tissue from posterior Cervical Esophageal wall during the swallow -- this did not significantly impede bolus motility through the UES nor impede clearing of the UES and just below. Any slight retention of liquid residue just below the UES  appeared to clear b/t trials w/ no buildup occurring as po trials of all consistencies continued. Suspect the presentation of the bulging along the Cervical Esophageal wall could be related to the sensation she experiences when eating solids and swallowing Pills at times(the food, pills "getting hung" feeling). No other bolus dysmotility was noted in the viewable Cervical Esophagus to level of the shoulders. No retrograde bolus activity occurred during this study w/ the trials given.  During the oral phase, timely bolus management, mastication, and control  of bolus propulsion for A-P transfer occurred. Oral clearing achieved w/ all trial consistencies. Any slight-min oral reside was cleared w/ independent, f/u swallows.  During the pharyngeal phase, Timely pharyngeal swallow initiation noted w/ all trial consistencies at the level of the BOT-valleculae. NO aspiration nor penetration occurred; airway closure appeared timely, tight. No pharyngeal residue remained post swallows indicating adequate laryngeal excursion and pharyngeal pressure during the swallow.  Discussed results of MBSS and impact of Esophageal issues such as REFLUX/GERD activity and Esophageal dysmotility on swallowing; discussed food consistencies such as meats/breads impacting Esophageal motility. Video viewed and questions answered. Recommended f/u w/ GI for ongoing management of REFLUX/GERD, PPI; education.   PLAN/RECOMMENDATIONS:  A. Diet: Regular diet w/ foods/meats cut Small; well-moistened foods. Thin liquids. Pills WHOLE in Puree for ease of clearing Cervical Esophagus - even consider liquid and chewable forms.  B. Swallowing Precautions: general aspiration precaution; REFLUX precautions   C. Recommended consultation to: GI for ongoing management of REFLUX/GERD, PPI; education.  D. Therapy recommendations: None.  E. Results and recommendations were discussed w/ patient; video viewed and questions answered.    Esophageal dysphagia  Other dysphagia - Plan: DG SWALLOW FUNC OP MEDICARE SPEECH PATH, DG SWALLOW FUNC OP MEDICARE SPEECH PATH  Gastroesophageal reflux disease, unspecified whether esophagitis present - Plan: DG SWALLOW FUNC OP MEDICARE SPEECH PATH, DG SWALLOW FUNC OP MEDICARE SPEECH PATH        Problem List Patient Active Problem List   Diagnosis Date Noted   Generalized osteoarthritis of multiple sites 07/28/2018   Varicose veins of both legs with edema 07/28/2018   FH: colon polyps 02/10/2018   S/P total knee arthroplasty 07/07/2017   Status post total  left knee replacement 10/28/2016   Failed total hip arthroplasty with dislocation (HCC) 12/01/2015   S/P closed reduction of dislocated total hip prosthesis 08/08/2015   S/P total hip arthroplasty 06/14/2015   Benign essential HTN 12/28/2013   Acid reflux 12/28/2013   HLD (hyperlipidemia) 12/28/2013   Restless leg 12/28/2013         Jerilynn Som, MS, CCC-SLP Speech Language Pathologist Rehab Services; [facility] [phone] ([phone]) [place], CCC-SLP 10/04/2021, 5:05 PM  [facility] DIAGNOSTIC RADIOLOGY [address] [place], [place], [address] Phone: [phone]   Fax:     Name: Claudia Terry MRN: [id number] Date of Birth: [birth year]

## 2021-10-15 DIAGNOSIS — M79671 Pain in right foot: Secondary | ICD-10-CM | POA: Diagnosis not present

## 2021-10-15 DIAGNOSIS — G8929 Other chronic pain: Secondary | ICD-10-CM | POA: Diagnosis not present

## 2021-10-15 DIAGNOSIS — F320 Major depressive disorder, single episode, mild: Secondary | ICD-10-CM | POA: Diagnosis not present

## 2021-10-22 DIAGNOSIS — M810 Age-related osteoporosis without current pathological fracture: Secondary | ICD-10-CM | POA: Diagnosis not present

## 2021-10-22 DIAGNOSIS — M76821 Posterior tibial tendinitis, right leg: Secondary | ICD-10-CM | POA: Diagnosis not present

## 2021-10-22 DIAGNOSIS — M2042 Other hammer toe(s) (acquired), left foot: Secondary | ICD-10-CM | POA: Diagnosis not present

## 2021-10-22 DIAGNOSIS — M216X1 Other acquired deformities of right foot: Secondary | ICD-10-CM | POA: Diagnosis not present

## 2021-10-22 DIAGNOSIS — M19071 Primary osteoarthritis, right ankle and foot: Secondary | ICD-10-CM | POA: Diagnosis not present

## 2021-10-22 DIAGNOSIS — M2142 Flat foot [pes planus] (acquired), left foot: Secondary | ICD-10-CM | POA: Diagnosis not present

## 2021-10-22 DIAGNOSIS — M79671 Pain in right foot: Secondary | ICD-10-CM | POA: Diagnosis not present

## 2021-10-22 DIAGNOSIS — M216X2 Other acquired deformities of left foot: Secondary | ICD-10-CM | POA: Diagnosis not present

## 2021-10-22 DIAGNOSIS — M2041 Other hammer toe(s) (acquired), right foot: Secondary | ICD-10-CM | POA: Diagnosis not present

## 2021-10-22 DIAGNOSIS — M2141 Flat foot [pes planus] (acquired), right foot: Secondary | ICD-10-CM | POA: Diagnosis not present

## 2021-10-23 ENCOUNTER — Other Ambulatory Visit: Payer: Self-pay | Admitting: Podiatry

## 2021-10-24 ENCOUNTER — Other Ambulatory Visit: Payer: Self-pay | Admitting: Podiatry

## 2021-10-28 ENCOUNTER — Emergency Department
Admission: EM | Admit: 2021-10-28 | Discharge: 2021-10-28 | Disposition: A | Discharge status: Home / Self Care | Payer: PPO | Attending: Emergency Medicine | Admitting: Emergency Medicine

## 2021-10-28 ENCOUNTER — Encounter: Payer: Self-pay | Admitting: Emergency Medicine

## 2021-10-28 ENCOUNTER — Other Ambulatory Visit: Payer: Self-pay

## 2021-10-28 ENCOUNTER — Emergency Department: Payer: PPO

## 2021-10-28 DIAGNOSIS — M25572 Pain in left ankle and joints of left foot: Secondary | ICD-10-CM | POA: Diagnosis not present

## 2021-10-28 DIAGNOSIS — W19XXXA Unspecified fall, initial encounter: Secondary | ICD-10-CM | POA: Insufficient documentation

## 2021-10-28 DIAGNOSIS — I10 Essential (primary) hypertension: Secondary | ICD-10-CM | POA: Diagnosis not present

## 2021-10-28 DIAGNOSIS — S7002XA Contusion of left hip, initial encounter: Secondary | ICD-10-CM | POA: Insufficient documentation

## 2021-10-28 DIAGNOSIS — Z79899 Other long term (current) drug therapy: Secondary | ICD-10-CM | POA: Diagnosis not present

## 2021-10-28 DIAGNOSIS — Z96643 Presence of artificial hip joint, bilateral: Secondary | ICD-10-CM | POA: Diagnosis not present

## 2021-10-28 DIAGNOSIS — S93402A Sprain of unspecified ligament of left ankle, initial encounter: Secondary | ICD-10-CM | POA: Diagnosis not present

## 2021-10-28 DIAGNOSIS — M19072 Primary osteoarthritis, left ankle and foot: Secondary | ICD-10-CM | POA: Diagnosis not present

## 2021-10-28 DIAGNOSIS — S99922A Unspecified injury of left foot, initial encounter: Secondary | ICD-10-CM | POA: Diagnosis present

## 2021-10-28 DIAGNOSIS — Z96653 Presence of artificial knee joint, bilateral: Secondary | ICD-10-CM | POA: Diagnosis not present

## 2021-10-28 DIAGNOSIS — Z96642 Presence of left artificial hip joint: Secondary | ICD-10-CM | POA: Diagnosis not present

## 2021-10-28 DIAGNOSIS — M25552 Pain in left hip: Secondary | ICD-10-CM | POA: Diagnosis not present

## 2021-10-28 MED ORDER — OXYCODONE-ACETAMINOPHEN 5-325 MG PO TABS
1.0000 | ORAL_TABLET | Freq: Once | ORAL | Status: AC
  Administered 2021-10-28: 1 via ORAL
  Filled 2021-10-28: qty 1

## 2021-10-28 MED ORDER — OXYCODONE-ACETAMINOPHEN 5-325 MG PO TABS: 1.0000 | ORAL_TABLET | ORAL | 0 refills | Status: DC | PRN

## 2021-10-28 NOTE — ED Triage Notes (Signed)
Pt arrived via POV with reports of L foot pain after fall, pt reports she is unable to bear weight on L foot.

## 2021-10-28 NOTE — Discharge Instructions (Signed)
Wear your ankle brace as needed.  You may take Percocet as needed for pain.  Return to the ER for worsening symptoms, persistent vomiting, difficulty breathing or other concerns.

## 2021-10-28 NOTE — ED Provider Notes (Signed)
[facility] Provider Note    Event Date/Time   First MD Initiated Contact with Patient 10/28/21 0533     (approximate)   History   Foot Injury   HPI  Claudia Terry is a 76 y.o. female who presents to the ED from home with a chief complaint of fall with left foot injury.  Patient with a history of osteoarthritis who had a mechanical fall 5 days ago landing on her left side.  Struck her left elbow, hip and ankle/foot.  Foot did not start hurting until this morning when it woke her up from sleep.  She is supposed to be going out of town so wanted to have some x-rays done before she left.  Did not strike head or suffer LOC.  Denies anticoagulant use.  Voices no other complaints or injuries.     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 Tiffany Kocher   Hypertension    Low back pain 11/95   Menopause    Osteoarthritis    Restless leg syndrome 1999     Active Problem List   Patient Active Problem List   Diagnosis Date Noted   Generalized osteoarthritis of multiple sites 07/28/2018   Varicose veins of both legs with edema 07/28/2018   FH: colon polyps 02/10/2018   S/P total knee arthroplasty 07/07/2017   Status post total left knee replacement 10/28/2016   Failed total hip arthroplasty with dislocation ([place]) 12/01/2015   S/P closed reduction of dislocated total hip prosthesis 08/08/2015   S/P total hip arthroplasty 06/14/2015   Benign essential HTN 12/28/2013   Acid reflux 12/28/2013   HLD (hyperlipidemia) 12/28/2013   Restless leg 12/28/2013     Past Surgical History   Past Surgical History:  Procedure Laterality Date   CATARACT EXTRACTION     CATARACT EXTRACTION W/PHACO Left 08/15/2020   Procedure: CATARACT EXTRACTION PHACO AND INTRAOCULAR LENS PLACEMENT (IOC) LEFT 6.60 00:39.8;   Surgeon: Birder Robson, MD;  Location: [place];  Service: Ophthalmology;  Laterality: Left;   CATARACT EXTRACTION W/PHACO Right 08/29/2020   Procedure: CATARACT EXTRACTION PHACO AND INTRAOCULAR LENS PLACEMENT (IOC) RIGHT 6.94 00:42.3;  Surgeon: Birder Robson, MD;  Location: [place];  Service: Ophthalmology;  Laterality: Right;   [place]   X2   COLONOSCOPY  12/06/2011   normal   COLONOSCOPY WITH PROPOFOL N/A 04/13/2018   Procedure: COLONOSCOPY WITH PROPOFOL;  Surgeon: Manya Silvas, MD;  Location: [facility] ENDOSCOPY;  Service: Endoscopy;  Laterality: N/A;   ESOPHAGOGASTRODUODENOSCOPY (EGD) WITH PROPOFOL N/A 02/23/2015   Procedure: ESOPHAGOGASTRODUODENOSCOPY (EGD) WITH PROPOFOL;  Surgeon: Manya Silvas, MD;  Location: [facility] ENDOSCOPY;  Service: Endoscopy;  Laterality: N/A;   ESOPHAGOGASTRODUODENOSCOPY (EGD) WITH PROPOFOL N/A 04/13/2018   Procedure: ESOPHAGOGASTRODUODENOSCOPY (EGD) WITH PROPOFOL;  Surgeon: Manya Silvas, MD;  Location: [facility] ENDOSCOPY;  Service: Endoscopy;  Laterality: N/A;   FOOT SURGERY     with screws   HIP CLOSED REDUCTION Left 08/08/2015   Procedure: CLOSED REDUCTION HIP;  Surgeon: Dereck Leep, MD;  Location: [facility];  Service: Orthopedics;  Laterality: Left;   HIP CLOSED REDUCTION Left 09/18/2015   Procedure: CLOSED REDUCTION HIP;  Surgeon: Hessie Knows, MD;  Location: [facility];  Service: Orthopedics;  Laterality: Left;  HIP CLOSED REDUCTION Left 12/01/2015   Procedure: CLOSED REDUCTION HIP;  Surgeon: Dereck Leep, MD;  Location: [facility];  Service: Orthopedics;  Laterality: Left;  no prep no incision   JOINT REPLACEMENT  10/2016   left knee   KNEE ARTHROPLASTY Left 10/28/2016   Procedure: COMPUTER ASSISTED TOTAL KNEE ARTHROPLASTY;  Surgeon: Dereck Leep, MD;  Location: [facility];  Service: Orthopedics;  Laterality: Left;   KNEE ARTHROPLASTY Right 07/07/2017   Procedure: COMPUTER ASSISTED TOTAL KNEE  ARTHROPLASTY;  Surgeon: Dereck Leep, MD;  Location: [facility];  Service: Orthopedics;  Laterality: Right;   KNEE ARTHROSCOPY Right 11/14/2014   Procedure: ARTHROSCOPY KNEE;  Surgeon: Dereck Leep, MD;  Location: [facility];  Service: Orthopedics;  Laterality: Right;  Posterior horn menicus, anterior lateral menicus grade 3 chondromalacia   phlebitis groin     right foot surgery     SHOULDER ARTHROSCOPY WITH OPEN ROTATOR CUFF REPAIR Left 03/10/2019   Procedure: LEFT SHOULDER ARTHROSCOPY, ARTHROSCOPIC ROTATOR CUFF REPAIR, DISTAL CLAVICLE EXCISION, SUBACROMIAL DECOMPRESSION, INTRA-ARTICULAR DEBRIDEMENT ;  Surgeon: Lovell Sheehan, MD;  Location: [facility];  Service: Orthopedics;  Laterality: Left;   SHOULDER ARTHROSCOPY WITH ROTATOR CUFF REPAIR Left 06/21/2019   Procedure: SHOULDER ARTHROSCOPY WITH EXTENSIVE INTRAARTICULAR DEBRIDEMENT WITH REMOVAL OF HARDWARE;  Surgeon: Lovell Sheehan, MD;  Location: [place];  Service: Orthopedics;  Laterality: Left;   TONSILLECTOMY     TOTAL HIP ARTHROPLASTY Left 05/08/2011   necrosis of hip   TOTAL HIP ARTHROPLASTY Right 06/14/2015   Procedure: TOTAL HIP ARTHROPLASTY;  Surgeon: Dereck Leep, MD;  Location: [facility];  Service: Orthopedics;  Laterality: Right;   TOTAL HIP REVISION Left 02/21/2016   Procedure: TOTAL HIP REVISION;  Surgeon: Dereck Leep, MD;  Location: [facility];  Service: Orthopedics;  Laterality: Left;     Home Medications   Prior to Admission medications   Medication Sig Start Date End Date Taking? Authorizing Provider  acetaminophen (TYLENOL) 500 MG tablet Take 1,000 mg by mouth every 6 (six) hours as needed.    [provider]  atenolol (TENORMIN) 50 MG tablet daily. 01/12/19   [provider]  Biotin 5000 MCG TABS Take 10,000 mcg by mouth daily.    [provider]  Calcium Carbonate-Vitamin D (CALTRATE 600+D PO) Take by mouth 2 (two) times daily.    [provider]  Cholecalciferol  (VITAMIN D-3 SL) Place 5,000 Units under the tongue.    [provider]  clindamycin (CLEOCIN) 300 MG capsule Take 300 mg by mouth daily.    [provider]  clobetasol cream (TEMOVATE) 0.05 % Apply to itchy bites QD-BID. Avoid the face, groin, and axilla. 06/18/21   Ralene Bathe, MD  Coenzyme Q10 (CO Q-10) 200 MG CAPS Take 100 mg by mouth daily.    [provider]  diclofenac Sodium (VOLTAREN) 1 % GEL diclofenac 1 % topical gel    [provider]  Glucosamine HCl-MSM 1500-500 MG/30ML LIQD Take by mouth 2 (two) times daily.    [provider]  ibuprofen (ADVIL) 200 MG tablet Take 400 mg by mouth every 6 (six) hours as needed.    [provider]  Melatonin 10 MG TABS Take 10 mg by mouth at bedtime.    [provider]  Menthol, Topical Analgesic, (BIOFREEZE EX) Apply 1 application topically daily as needed (foot pain).    [provider]  multivitamin-lutein (OCUVITE-LUTEIN) CAPS capsule Take 1 capsule by mouth daily.  [provider]  omeprazole (PRILOSEC) 20 MG capsule Take by mouth. 11/10/18   [provider]  pramipexole (MIRAPEX) 1.5 MG tablet Take 1.5 mg by mouth at bedtime.     [provider]  Red Yeast Rice 600 MG TABS Take 600 mg by mouth 2 (two) times daily.    [provider]  traZODone (DESYREL) 50 MG tablet trazodone 50 mg tablet 11/02/19   [provider]  Turmeric 400 MG CAPS Take by mouth daily.    [provider]  venlafaxine XR (EFFEXOR-XR) 150 MG 24 hr capsule Take by mouth. 03/30/21 03/30/22  [provider]     Allergies  Penicillins, Erythromycin, Other, and Tetracyclines & related   Family History   Family History  Problem Relation Age of Onset   Diabetes Mother    Osteoarthritis Mother    Diabetes Maternal Grandfather    Hypertension Maternal Grandfather    Ovarian cancer Paternal Aunt    Cancer - Lung Paternal Uncle       Physical Exam  Triage Vital Signs: ED Triage Vitals  Enc Vitals Group     BP 10/28/21 0530 130/75     Pulse Rate 10/28/21 0530 75     Resp 10/28/21 0530 18     Temp 10/28/21 0530 98.7 F (37.1 C)     Temp Source 10/28/21 0530 Oral     SpO2 10/28/21 0530 97 %     Weight --      Height --      Head Circumference --      Peak Flow --      Pain Score 10/28/21 0525 9     Pain Loc --      Pain Edu? --      Excl. in [place]? --     Updated Vital Signs: BP 130/75 (BP Location: Left Arm)   Pulse 75   Temp 98.7 F (37.1 C) (Oral)   Resp 18   LMP 02/08/2005 (Approximate)   SpO2 97%    General: Awake, no distress.  CV:  RRR.  Good peripheral perfusion.  Resp:  Normal effort.  CTA B. Abd:  Nontender.  No distention.  Other:  No cervical spine tenderness to palpation.  Left elbow, old ecchymosis.  Full range of motion without pain.  2+ radial pulse.  Brisk, less than 5-second capillary refill.  Pelvis is stable.  Left hip: No tenderness to palpation.  Full range of motion without pain.  Left ankle/foot: Mild lateral malleoli or swelling.  Limited range of motion secondary to pain.  2+ distal pulses.  Brisk, less than 5-second cap refill.   ED Results / Procedures / Treatments  Labs (all labs ordered are listed, but only abnormal results are displayed) Labs Reviewed - No data to display   EKG  None   RADIOLOGY I have independently visualized and interpreted patient's x-rays as well as noted the radiology interpretation:  Left hip x-ray: No acute osseous injury  Left ankle x-ray: No acute osseous injury  Left foot x-ray:No acute osseous injury  Official radiology report(s): DG Ankle Complete Left  Result Date: 10/28/2021 CLINICAL DATA:  76 year old female status post fall. Pain. EXAM: LEFT ANKLE COMPLETE - 3+ VIEW COMPARISON:  Left foot series today. FINDINGS: Chronic talus ORIF redemonstrated. See foot series today. Mortise joint alignment maintained. Talar dome  intact. No evidence of joint effusion. Distal tibia, fibula, and calcaneus appear intact. No acute osseous abnormality identified. Midfoot degeneration redemonstrated, see foot series  today. IMPRESSION: No acute fracture or dislocation identified about the left ankle. Chronic talus ORIF. Electronically Signed   By: Genevie Ann M.D.   On: 10/28/2021 06:31   DG Foot Complete Left  Result Date: 10/28/2021 CLINICAL DATA:  76 year old female status post fall. Pain. EXAM: LEFT FOOT - COMPLETE 3+ VIEW COMPARISON:  Left foot series 06/03/2020. FINDINGS: Chronic orthopedic screw traversing the inferior talus. Calcaneus appears stable and intact with mild degenerative spurring. There is advanced chronic midfoot degeneration with joint space loss and subchondral sclerosis. Joint spaces and alignment appear stable. No acute osseous abnormality identified. IMPRESSION: 1. No acute fracture or dislocation identified about the left foot. 2. Advanced chronic midfoot degeneration and chronic talus ORIF. Electronically Signed   By: Genevie Ann M.D.   On: 10/28/2021 06:28   DG Hip Unilat With Pelvis 2-3 Views Left  Result Date: 10/28/2021 CLINICAL DATA:  76 year old female status post fall. Pain. EXAM: DG HIP (WITH OR WITHOUT PELVIS) 2-3V LEFT COMPARISON:  Left hip series 02/21/2016. FINDINGS: Chronic bilateral hip arthroplasty. Pelvis appears stable and intact. Symmetric SI joints. Partially visible bipolar right hip arthroplasty hardware appears stable. Left bipolar type hip arthroplasty hardware appears stable and intact. Proximal left femur appears intact. No acute osseous abnormality identified. Partially visible lumbar spine degeneration. Negative visible lower abdominal and pelvic visceral contours. IMPRESSION: Chronic bilateral hip arthroplasty. No acute fracture or dislocation identified about the left hip or pelvis. Electronically Signed   By: Genevie Ann M.D.   On: 10/28/2021 06:29     PROCEDURES:  Critical Care performed:  No  Procedures   MEDICATIONS ORDERED IN ED: Medications  oxyCODONE-acetaminophen (PERCOCET/ROXICET) 5-325 MG per tablet 1 tablet (1 tablet Oral Given 10/28/21 0617)     IMPRESSION / MDM / ASSESSMENT AND PLAN / ED COURSE  I reviewed the triage vital signs and the nursing notes.                             76 year old female presenting with left hip and ankle pain status post fall 5 days ago.  Will obtain x-rays of left hip, ankle and foot.  Administer Percocet for pain.  Will reassess    Clinical Course as of 10/28/21 4665  Sun Oct 28, 2021  0707 Updated patient and spouse of negative x-rays.  Patient has a lace up ankle brace which she will wear.  Advised frequent stops and her out-of-town trip to avoid DVT.  She will follow-up with podiatry as scheduled.  As needed Percocet prescription provided.  Strict return precautions given.  Both verbalized understanding and agree with plan of care. [JS]    Clinical Course User Index [JS] Paulette Blanch, MD     FINAL CLINICAL IMPRESSION(S) / ED DIAGNOSES   Final diagnoses:  Sprain of left ankle, unspecified ligament, initial encounter  Contusion of left hip, initial encounter     Rx / DC Orders   ED Discharge Orders     None        Note:  This document was prepared using Dragon voice recognition software and may include unintentional dictation errors.   Paulette Blanch, MD 10/28/21 [phone]

## 2021-11-06 ENCOUNTER — Inpatient Hospital Stay: Admission: RE | Admit: 2021-11-06 | Payer: PPO | Source: Ambulatory Visit

## 2021-11-12 ENCOUNTER — Encounter
Admission: RE | Admit: 2021-11-12 | Discharge: 2021-11-12 | Disposition: A | Discharge status: Home / Self Care | Payer: PPO | Source: Ambulatory Visit | Attending: Podiatry | Admitting: Podiatry

## 2021-11-12 VITALS — Ht 63.0 in | Wt 194.0 lb

## 2021-11-12 DIAGNOSIS — I10 Essential (primary) hypertension: Secondary | ICD-10-CM

## 2021-11-12 DIAGNOSIS — Z01812 Encounter for preprocedural laboratory examination: Secondary | ICD-10-CM

## 2021-11-12 NOTE — Patient Instructions (Addendum)
Your procedure is scheduled on: Friday November 16, 2021. Report to Day Surgery inside [facility] 2nd floor, stop by admissions desk before getting on elevator. To find out your arrival time please call [phone] between 1PM - 3PM on Thursday November 15, 2021.  Remember: Instructions that are not followed completely may result in serious medical risk,  up to and including death, or upon the discretion of your surgeon and anesthesiologist your  surgery may need to be rescheduled.     _X__ 1. Do not eat food after midnight the night before your procedure.                 No chewing gum or hard candies. You may drink clear liquids up to 2 hours                 before you are scheduled to arrive for your surgery- DO not drink clear                 liquids within 2 hours of the start of your surgery.                 Clear Liquids include:  water, apple juice without pulp, clear Gatorade, G2 or                  Gatorade Zero (avoid Red/Purple/Blue), Black Coffee or Tea (Do not add                 anything to coffee or tea).  __X__2.   Complete the "Ensure Clear Pre-surgery Clear Carbohydrate Drink" provided to you, 2 hours before arrival. **If you are diabetic you will be provided with an alternative drink, Gatorade Zero or G2.  __X__3.  On the morning of surgery brush your teeth with toothpaste and water, you                may rinse your mouth with mouthwash if you wish.  Do not swallow any toothpaste or mouthwash.     _X__ 4.  No Alcohol for 24 hours before or after surgery.   _X__ 5.  Do Not Smoke or use e-cigarettes For 24 Hours Prior to Your Surgery.                 Do not use any chewable tobacco products for at least 6 hours prior to                 Surgery.  _X__  6.  Do not use any recreational drugs (marijuana, cocaine, heroin, ecstasy, MDMA or other)                For at least one week prior to your surgery.  Combination of these drugs with anesthesia                 May have life threatening results.  ____  7.  Bring all medications with you on the day of surgery if instructed.   __X__8.  Notify your doctor if there is any change in your medical condition      (cold, fever, infections).     Do not wear jewelry, make-up, hairpins, clips or nail polish. Do not wear lotions, powders, or perfumes. You may wear deodorant. Do not shave 48 hours prior to surgery. Men may shave face and neck. Do not bring valuables to the hospital.    [facility] is not responsible for any belongings or valuables.  Contacts, dentures  or bridgework may not be worn into surgery. Leave your suitcase in the car. After surgery it may be brought to your room. For patients admitted to the hospital, discharge time is determined by your treatment team.   Patients discharged the day of surgery will not be allowed to drive home.   Make arrangements for someone to be with you for the first 24 hours of your Same Day Discharge.   __X__ Take these medicines the morning of surgery with A SIP OF WATER:    1. atenolol (TENORMIN) 50 MG   2. omeprazole (PRILOSEC) 20 MG  3. venlafaxine XR (EFFEXOR-XR) 150 MG  4.  5.  6.  ____ Fleet Enema (as directed)   _X___ Use CHG Soap (or wipes) as directed  ____ Use Benzoyl Peroxide Gel as instructed  ____ Use inhalers on the day of surgery  ____ Stop metformin 2 days prior to surgery    ____ Take 1/2 of usual insulin dose the night before surgery. No insulin the morning          of surgery.   ____ Call your PCP, cardiologist, or Pulmonologist if taking Coumadin/Plavix/aspirin and ask when to stop before your surgery.   __X__ One Week prior to surgery- Stop Anti-inflammatories such as Ibuprofen, Aleve, Advil, Motrin, meloxicam (MOBIC), diclofenac, etodolac, ketorolac, Toradol, Daypro, piroxicam, Goody's or BC powders. OK TO USE TYLENOL IF NEEDED   __X__ Stop ALL supplements until after surgery. Turmeric, Red Yeast Rice,  Melatonin, Glucosamine and Biotin   ____ Bring C-Pap to the hospital.    If you have any questions regarding your pre-procedure instructions,  Please call Pre-admit Testing at [phone]

## 2021-11-13 ENCOUNTER — Encounter
Admission: RE | Admit: 2021-11-13 | Discharge: 2021-11-13 | Disposition: A | Discharge status: Home / Self Care | Payer: PPO | Source: Ambulatory Visit | Attending: Podiatry | Admitting: Podiatry

## 2021-11-13 DIAGNOSIS — I10 Essential (primary) hypertension: Secondary | ICD-10-CM | POA: Insufficient documentation

## 2021-11-13 DIAGNOSIS — Z01818 Encounter for other preprocedural examination: Secondary | ICD-10-CM | POA: Insufficient documentation

## 2021-11-13 DIAGNOSIS — Z01810 Encounter for preprocedural cardiovascular examination: Secondary | ICD-10-CM | POA: Diagnosis not present

## 2021-11-13 DIAGNOSIS — Z01812 Encounter for preprocedural laboratory examination: Secondary | ICD-10-CM

## 2021-11-13 LAB — CBC
HCT: 39.2 % (ref 36.0–46.0)
Hemoglobin: 13 g/dL (ref 12.0–15.0)
MCH: 30.8 pg (ref 26.0–34.0)
MCHC: 33.2 g/dL (ref 30.0–36.0)
MCV: 92.9 fL (ref 80.0–100.0)
Platelets: 226 10*3/uL (ref 150–400)
RBC: 4.22 MIL/uL (ref 3.87–5.11)
RDW: 13 % (ref 11.5–15.5)
WBC: 7.2 10*3/uL (ref 4.0–10.5)
nRBC: 0 % (ref 0.0–0.2)

## 2021-11-13 LAB — BASIC METABOLIC PANEL
Anion gap: 10 (ref 5–15)
BUN: 15 mg/dL (ref 8–23)
CO2: 26 mmol/L (ref 22–32)
Calcium: 9.4 mg/dL (ref 8.9–10.3)
Chloride: 104 mmol/L (ref 98–111)
Creatinine, Ser: 0.68 mg/dL (ref 0.44–1.00)
GFR, Estimated: >60 mL/min (ref >60)
Glucose, Bld: 95 mg/dL (ref 70–99)
Potassium: 3.7 mmol/L (ref 3.5–5.1)
Sodium: 140 mmol/L (ref 135–145)

## 2021-11-16 ENCOUNTER — Other Ambulatory Visit: Payer: Self-pay

## 2021-11-16 ENCOUNTER — Ambulatory Visit: Payer: PPO

## 2021-11-16 ENCOUNTER — Ambulatory Visit: Payer: PPO | Admitting: Anesthesiology

## 2021-11-16 ENCOUNTER — Observation Stay
Admission: RE | Admit: 2021-11-16 | Discharge: 2021-11-17 | Disposition: A | Discharge status: Home / Self Care | Payer: PPO | Attending: Podiatry | Admitting: Podiatry

## 2021-11-16 ENCOUNTER — Encounter
Admission: RE | Disposition: A | Discharge status: Home / Self Care | Payer: Self-pay | Source: Home / Self Care | Attending: Podiatry

## 2021-11-16 ENCOUNTER — Ambulatory Visit: Payer: PPO | Admitting: Urgent Care

## 2021-11-16 ENCOUNTER — Encounter: Payer: Self-pay | Admitting: Podiatry

## 2021-11-16 DIAGNOSIS — K219 Gastro-esophageal reflux disease without esophagitis: Secondary | ICD-10-CM | POA: Diagnosis not present

## 2021-11-16 DIAGNOSIS — Z9889 Other specified postprocedural states: Secondary | ICD-10-CM | POA: Diagnosis not present

## 2021-11-16 DIAGNOSIS — G8918 Other acute postprocedural pain: Secondary | ICD-10-CM | POA: Diagnosis present

## 2021-11-16 DIAGNOSIS — Z8616 Personal history of COVID-19: Secondary | ICD-10-CM | POA: Insufficient documentation

## 2021-11-16 DIAGNOSIS — M19071 Primary osteoarthritis, right ankle and foot: Secondary | ICD-10-CM | POA: Insufficient documentation

## 2021-11-16 DIAGNOSIS — F32A Depression, unspecified: Secondary | ICD-10-CM | POA: Diagnosis present

## 2021-11-16 DIAGNOSIS — M67873 Other specified disorders of tendon, right ankle and foot: Secondary | ICD-10-CM | POA: Diagnosis not present

## 2021-11-16 DIAGNOSIS — M2141 Flat foot [pes planus] (acquired), right foot: Principal | ICD-10-CM | POA: Insufficient documentation

## 2021-11-16 DIAGNOSIS — M216X1 Other acquired deformities of right foot: Secondary | ICD-10-CM | POA: Diagnosis not present

## 2021-11-16 DIAGNOSIS — I10 Essential (primary) hypertension: Secondary | ICD-10-CM | POA: Diagnosis not present

## 2021-11-16 DIAGNOSIS — Z09 Encounter for follow-up examination after completed treatment for conditions other than malignant neoplasm: Secondary | ICD-10-CM

## 2021-11-16 DIAGNOSIS — M79671 Pain in right foot: Secondary | ICD-10-CM | POA: Diagnosis not present

## 2021-11-16 DIAGNOSIS — G2581 Restless legs syndrome: Secondary | ICD-10-CM

## 2021-11-16 HISTORY — PX: HARDWARE REMOVAL: SHX979

## 2021-11-16 HISTORY — PX: FOOT ARTHRODESIS: SHX1655

## 2021-11-16 HISTORY — PX: ACHILLES TENDON SURGERY: SHX542

## 2021-11-16 HISTORY — PX: ARTHRODESIS METATARSAL: SHX6565

## 2021-11-16 SURGERY — TENOTOMY, ACHILLES
Anesthesia: General | Site: Toe | Laterality: Right

## 2021-11-16 MED ORDER — PROPOFOL 10 MG/ML IV BOLUS
INTRAVENOUS | Status: DC | PRN
  Administered 2021-11-16: 120 mg via INTRAVENOUS

## 2021-11-16 MED ORDER — SODIUM CHLORIDE FLUSH 0.9 % IV SOLN
INTRAVENOUS | Status: AC
Start: 2021-11-16 — End: ?
  Filled 2021-11-16: qty 10

## 2021-11-16 MED ORDER — PROPOFOL 1000 MG/100ML IV EMUL
INTRAVENOUS | Status: AC
  Filled 2021-11-16: qty 100

## 2021-11-16 MED ORDER — FENTANYL CITRATE (PF) 100 MCG/2ML IJ SOLN
INTRAMUSCULAR | Status: AC
  Filled 2021-11-16: qty 2

## 2021-11-16 MED ORDER — BUPIVACAINE HCL (PF) 0.5 % IJ SOLN: INTRAMUSCULAR | Status: DC | PRN

## 2021-11-16 MED ORDER — LACTATED RINGERS IV SOLN: INTRAVENOUS | Status: DC | PRN

## 2021-11-16 MED ORDER — LIDOCAINE HCL (PF) 1 % IJ SOLN
INTRAMUSCULAR | Status: DC | PRN
  Administered 2021-11-16 (×2): 1 mL via INTRADERMAL

## 2021-11-16 MED ORDER — ATENOLOL 50 MG PO TABS: 50.0000 mg | ORAL_TABLET | Freq: Every day | ORAL | Status: DC

## 2021-11-16 MED ORDER — LIDOCAINE HCL (PF) 1 % IJ SOLN
INTRAMUSCULAR | Status: AC
  Filled 2021-11-16: qty 2

## 2021-11-16 MED ORDER — CHLORHEXIDINE GLUCONATE 0.12 % MT SOLN
OROMUCOSAL | Status: AC
  Administered 2021-11-16: 15 mL via OROMUCOSAL
  Filled 2021-11-16: qty 15

## 2021-11-16 MED ORDER — CHLORHEXIDINE GLUCONATE 0.12 % MT SOLN: 15.0000 mL | Freq: Once | OROMUCOSAL | Status: AC

## 2021-11-16 MED ORDER — ATENOLOL 50 MG PO TABS
50.0000 mg | ORAL_TABLET | Freq: Every day | ORAL | Status: DC
  Administered 2021-11-17: 50 mg via ORAL
  Filled 2021-11-16: qty 1

## 2021-11-16 MED ORDER — MIDAZOLAM HCL 2 MG/2ML IJ SOLN
INTRAMUSCULAR | Status: AC
  Filled 2021-11-16: qty 2

## 2021-11-16 MED ORDER — ONDANSETRON HCL 4 MG/2ML IJ SOLN
INTRAMUSCULAR | Status: DC | PRN
  Administered 2021-11-16: 4 mg via INTRAVENOUS

## 2021-11-16 MED ORDER — OCUVITE-LUTEIN PO CAPS
1.0000 | ORAL_CAPSULE | Freq: Every day | ORAL | Status: DC
  Administered 2021-11-17: 1 via ORAL
  Filled 2021-11-16: qty 1

## 2021-11-16 MED ORDER — VANCOMYCIN HCL IN DEXTROSE 1-5 GM/200ML-% IV SOLN
1000.0000 mg | INTRAVENOUS | Status: AC
Start: 2021-11-16 — End: 2021-11-16
  Administered 2021-11-16: 1000 mg via INTRAVENOUS

## 2021-11-16 MED ORDER — MIDAZOLAM HCL 5 MG/5ML IJ SOLN
INTRAMUSCULAR | Status: DC | PRN
  Administered 2021-11-16: 2 mg via INTRAVENOUS

## 2021-11-16 MED ORDER — ROCURONIUM BROMIDE 100 MG/10ML IV SOLN
INTRAVENOUS | Status: DC | PRN
  Administered 2021-11-16: 50 mg via INTRAVENOUS
  Administered 2021-11-16: 10 mg via INTRAVENOUS

## 2021-11-16 MED ORDER — PRAMIPEXOLE DIHYDROCHLORIDE 1 MG PO TABS
1.5000 mg | ORAL_TABLET | Freq: Every day | ORAL | Status: DC
  Administered 2021-11-16: 1.5 mg via ORAL
  Filled 2021-11-16 (×2): qty 2

## 2021-11-16 MED ORDER — OXYCODONE HCL 5 MG PO TABS: 5.0000 mg | ORAL_TABLET | Freq: Once | ORAL | Status: DC | PRN

## 2021-11-16 MED ORDER — ACETAMINOPHEN 500 MG PO TABS: 1000.0000 mg | ORAL_TABLET | Freq: Four times a day (QID) | ORAL | Status: DC | PRN

## 2021-11-16 MED ORDER — BUPIVACAINE LIPOSOME 1.3 % IJ SUSP
INTRAMUSCULAR | Status: AC
  Filled 2021-11-16: qty 10

## 2021-11-16 MED ORDER — PROMETHAZINE HCL 25 MG/ML IJ SOLN: 6.2500 mg | INTRAMUSCULAR | Status: DC | PRN

## 2021-11-16 MED ORDER — PANTOPRAZOLE SODIUM 40 MG PO TBEC
40.0000 mg | DELAYED_RELEASE_TABLET | Freq: Every day | ORAL | Status: DC
  Administered 2021-11-17: 40 mg via ORAL
  Filled 2021-11-16: qty 1

## 2021-11-16 MED ORDER — ACETAMINOPHEN 10 MG/ML IV SOLN: 1000.0000 mg | Freq: Once | INTRAVENOUS | Status: DC | PRN

## 2021-11-16 MED ORDER — ACETAMINOPHEN 325 MG PO TABS
650.0000 mg | ORAL_TABLET | Freq: Four times a day (QID) | ORAL | Status: DC | PRN
Start: 2021-11-16 — End: 2021-11-17

## 2021-11-16 MED ORDER — LACTATED RINGERS IV SOLN: INTRAVENOUS | Status: DC

## 2021-11-16 MED ORDER — FENTANYL CITRATE (PF) 100 MCG/2ML IJ SOLN: 25.0000 ug | INTRAMUSCULAR | Status: DC | PRN

## 2021-11-16 MED ORDER — BUPIVACAINE HCL (PF) 0.5 % IJ SOLN
INTRAMUSCULAR | Status: DC | PRN
  Administered 2021-11-16 (×2): 50 mg via PERINEURAL

## 2021-11-16 MED ORDER — PHENYLEPHRINE HCL-NACL 20-0.9 MG/250ML-% IV SOLN
INTRAVENOUS | Status: DC | PRN
  Administered 2021-11-16: 40 ug/min via INTRAVENOUS

## 2021-11-16 MED ORDER — TRAZODONE HCL 50 MG PO TABS
50.0000 mg | ORAL_TABLET | Freq: Every day | ORAL | Status: DC
  Administered 2021-11-16: 50 mg via ORAL
  Filled 2021-11-16: qty 1

## 2021-11-16 MED ORDER — OXYCODONE-ACETAMINOPHEN 5-325 MG PO TABS
1.0000 | ORAL_TABLET | Freq: Four times a day (QID) | ORAL | Status: DC | PRN
  Administered 2021-11-16 – 2021-11-17 (×4): 1 via ORAL
  Filled 2021-11-16 (×5): qty 1

## 2021-11-16 MED ORDER — DEXAMETHASONE SODIUM PHOSPHATE 10 MG/ML IJ SOLN
INTRAMUSCULAR | Status: DC | PRN
  Administered 2021-11-16: 8 mg via INTRAVENOUS

## 2021-11-16 MED ORDER — SODIUM CHLORIDE (PF) 0.9 % IJ SOLN
INTRAMUSCULAR | Status: DC | PRN
  Administered 2021-11-16: 20 mL via INTRAMUSCULAR

## 2021-11-16 MED ORDER — LIDOCAINE HCL (CARDIAC) PF 100 MG/5ML IV SOSY
PREFILLED_SYRINGE | INTRAVENOUS | Status: DC | PRN
  Administered 2021-11-16: 100 mg via INTRAVENOUS

## 2021-11-16 MED ORDER — VENLAFAXINE HCL 37.5 MG PO TABS
75.0000 mg | ORAL_TABLET | Freq: Every day | ORAL | Status: DC
  Administered 2021-11-17: 75 mg via ORAL
  Filled 2021-11-16: qty 2

## 2021-11-16 MED ORDER — OXYCODONE HCL 5 MG/5ML PO SOLN: 5.0000 mg | Freq: Once | ORAL | Status: DC | PRN

## 2021-11-16 MED ORDER — 0.9 % SODIUM CHLORIDE (POUR BTL) OPTIME
TOPICAL | Status: DC | PRN
  Administered 2021-11-16: 500 mL

## 2021-11-16 MED ORDER — SUGAMMADEX SODIUM 200 MG/2ML IV SOLN
INTRAVENOUS | Status: DC | PRN
  Administered 2021-11-16: 180 mg via INTRAVENOUS

## 2021-11-16 MED ORDER — VANCOMYCIN HCL IN DEXTROSE 1-5 GM/200ML-% IV SOLN
INTRAVENOUS | Status: AC
  Filled 2021-11-16: qty 200

## 2021-11-16 MED ORDER — ORAL CARE MOUTH RINSE: 15.0000 mL | Freq: Once | OROMUCOSAL | Status: AC

## 2021-11-16 MED ORDER — PHENYLEPHRINE HCL (PRESSORS) 10 MG/ML IV SOLN
INTRAVENOUS | Status: DC | PRN
  Administered 2021-11-16 (×3): 160 ug via INTRAVENOUS
  Administered 2021-11-16: 40 ug via INTRAVENOUS

## 2021-11-16 MED ORDER — DROPERIDOL 2.5 MG/ML IJ SOLN: 0.6250 mg | Freq: Once | INTRAMUSCULAR | Status: DC | PRN

## 2021-11-16 MED ORDER — BUPIVACAINE HCL (PF) 0.5 % IJ SOLN
INTRAMUSCULAR | Status: AC
  Filled 2021-11-16: qty 20

## 2021-11-16 SURGICAL SUPPLY — 85 items
BIT DRILL 2 FENESTRATED (MISCELLANEOUS) IMPLANT
BIT DRILL 2.4X140 LONG SOLID (BIT) ×1 IMPLANT
BIT DRILL CANNULATED 4.6 (BIT) ×1 IMPLANT
BIT DRILL CANNULTD 2.6 X 130MM (DRILL) IMPLANT
BIT DRILL CNTRSNK MONSTER 7.0 (DRILL) IMPLANT
BIT DRILLL 2 FENESTRATED (MISCELLANEOUS) ×3
BLADE SURG 15 STRL LF DISP TIS (BLADE) IMPLANT
BLADE SURG 15 STRL SS (BLADE)
BLADE SURG MINI STRL (BLADE) IMPLANT
BNDG CMPR 75X41 PLY HI ABS (GAUZE/BANDAGES/DRESSINGS) ×2
BNDG CMPR STD VLCR NS LF 5.8X4 (GAUZE/BANDAGES/DRESSINGS) ×4
BNDG CONFORM 3 STRL LF (GAUZE/BANDAGES/DRESSINGS) ×1 IMPLANT
BNDG ELASTIC 4X5.8 VLCR NS LF (GAUZE/BANDAGES/DRESSINGS) ×8 IMPLANT
BNDG ELASTIC 4X5.8 VLCR STR LF (GAUZE/BANDAGES/DRESSINGS) ×1 IMPLANT
BNDG ESMARK 4X12 TAN STRL LF (GAUZE/BANDAGES/DRESSINGS) ×4 IMPLANT
BNDG GAUZE ELAST 4 BULKY (GAUZE/BANDAGES/DRESSINGS) ×4 IMPLANT
BNDG STRETCH 4X75 STRL LF (GAUZE/BANDAGES/DRESSINGS) ×4 IMPLANT
BONE CANC CHIPS 20CC PCAN1/4 (Bone Implant) ×3 IMPLANT
BONE MATRIX TRINITY 10CC (Bone Implant) ×1 IMPLANT
CHIPS CANC BONE 20CC PCAN1/4 (Bone Implant) ×2 IMPLANT
COUNTERSICK 4.0 HEADED (MISCELLANEOUS) ×3
COVER LIGHT HANDLE STERIS (MISCELLANEOUS) ×8 IMPLANT
CUFF TOURN SGL QUICK 34 (TOURNIQUET CUFF)
CUFF TRNQT CYL 34X4.125X (TOURNIQUET CUFF) IMPLANT
DRAPE C-ARMOR (DRAPES) ×1 IMPLANT
DRAPE FLUOR MINI C-ARM 54X84 (DRAPES) ×4 IMPLANT
DRILL CANNULATED 2.6 X 130MM (DRILL) ×3
DRILL COUNTERSINK MONSTER 7.0 (DRILL) ×3
DURAPREP 26ML APPLICATOR (WOUND CARE) ×4 IMPLANT
ELECT REM PT RETURN 9FT ADLT (ELECTROSURGICAL) ×3
ELECTRODE REM PT RTRN 9FT ADLT (ELECTROSURGICAL) ×3 IMPLANT
GAUZE 4X4 16PLY ~~LOC~~+RFID DBL (SPONGE) ×1 IMPLANT
GAUZE SPONGE 4X4 12PLY STRL (GAUZE/BANDAGES/DRESSINGS) ×4 IMPLANT
GAUZE XEROFORM 1X8 LF (GAUZE/BANDAGES/DRESSINGS) ×4 IMPLANT
GLOVE BIO SURGEON STRL SZ7 (GLOVE) ×4 IMPLANT
GLOVE BIOGEL PI IND STRL 7.0 (GLOVE) ×3 IMPLANT
GLOVE BIOGEL PI INDICATOR 7.0 (GLOVE) ×1
GOWN STRL REUS W/ TWL LRG LVL3 (GOWN DISPOSABLE) ×6 IMPLANT
GOWN STRL REUS W/TWL LRG LVL3 (GOWN DISPOSABLE) ×6
GRAFT BNE CANC CHIPS 1-8 20CC (Bone Implant) IMPLANT
HEMOSTAT SURGICEL 2X3 (HEMOSTASIS) ×1 IMPLANT
K-WIRE SINGLE TROCAR 2.3X230 (WIRE) ×15
K-WIRE SMOOTH 1.6X150MM (WIRE) ×15
K-WIRE SNGL END 1.2X150 (MISCELLANEOUS) ×6
KIT PROCEDURE DRILL (DRILL) ×1 IMPLANT
KIT TURNOVER KIT A (KITS) ×4 IMPLANT
KWIRE SINGLE TROCAR 2.3X230 (WIRE) IMPLANT
KWIRE SMOOTH 1.6X150MM (WIRE) IMPLANT
KWIRE SNGL END 1.2X150 (MISCELLANEOUS) IMPLANT
MANIFOLD NEPTUNE II (INSTRUMENTS) ×4 IMPLANT
NS IRRIG 500ML POUR BTL (IV SOLUTION) ×4 IMPLANT
PACK EXTREMITY ARMC (MISCELLANEOUS) ×4 IMPLANT
PAD ABD DERMACEA PRESS 5X9 (GAUZE/BANDAGES/DRESSINGS) ×1 IMPLANT
PAD CAST CTTN 4X4 STRL (SOFTGOODS) IMPLANT
PADDING CAST BLEND 4X4 NS (MISCELLANEOUS) ×12 IMPLANT
PADDING CAST COTTON 4X4 STRL (SOFTGOODS) ×3
PENCIL SMOKE EVACUATOR (MISCELLANEOUS) ×4 IMPLANT
PLATE COL ARCH MED 1.5 RT (Plate) ×1 IMPLANT
RASP SM TEAR CROSS CUT (RASP) ×1 IMPLANT
SCREW CANN 4.0X48 SHORT THREAD (Screw) ×1 IMPLANT
SCREW CANN 4.0X50 SHORT (Screw) ×1 IMPLANT
SCREW CANN HDLS 7X70 (Screw) ×1 IMPLANT
SCREW CANN HDLS ST 7X72 (Screw) ×1 IMPLANT
SCREW COUNTERSINK 4.0 HEADED (MISCELLANEOUS) IMPLANT
SCREW LOCK 3 3.5X18 (Screw) ×1 IMPLANT
SCREW LOCK PLATE R3 3.5X14 (Screw) ×1 IMPLANT
SCREW LOCK PLATE R3 3.5X20 (Screw) ×2 IMPLANT
SCREW LOCK PLATE R3 3.5X22 (Screw) ×2 IMPLANT
SCREW LOCK PLATE R3 3.5X24 (Screw) ×1 IMPLANT
SCREW LOCK PLATE R3 3.5X32 (Screw) ×1 IMPLANT
SPLINT CAST 1 STEP 4X30 (MISCELLANEOUS) ×4 IMPLANT
SPONGE T-LAP 18X18 ~~LOC~~+RFID (SPONGE) ×2 IMPLANT
STAPLE BONE 20X22X22 STRL (Staple) ×2 IMPLANT
STAPLER SKIN PROX 35W (STAPLE) ×1 IMPLANT
STIMULATOR BONE (ORTHOPEDIC SUPPLIES) ×3
STIMULATOR BONE GROWTH EMG EXT (ORTHOPEDIC SUPPLIES) IMPLANT
STOCKINETTE IMPERVIOUS 9X36 MD (GAUZE/BANDAGES/DRESSINGS) ×4 IMPLANT
SUT ETHILON 3-0 FS-10 30 BLK (SUTURE) ×3
SUT ETHILON NAB PS2 4-0 18IN (SUTURE) ×4 IMPLANT
SUT VIC AB 3-0 SH 27 (SUTURE)
SUT VIC AB 3-0 SH 27X BRD (SUTURE) IMPLANT
SUT VIC AB 4-0 FS2 27 (SUTURE) IMPLANT
SUTURE EHLN 3-0 FS-10 30 BLK (SUTURE) ×3 IMPLANT
WATER STERILE IRR 500ML POUR (IV SOLUTION) ×4 IMPLANT
WIRE OLIVE SMOOTH 1.4MMX60MM (WIRE) ×2 IMPLANT

## 2021-11-16 NOTE — Anesthesia Procedure Notes (Signed)
Anesthesia Regional Block: Adductor canal block   Pre-Anesthetic Checklist: , timeout performed,  Correct Patient, Correct Site, Correct Laterality,  Correct Procedure, Correct Position, site marked,  Risks and benefits discussed,  Surgical consent,  Pre-op evaluation,  At surgeon's request and post-op pain management  Laterality: Right  Prep: chloraprep       Needles:  Injection technique: Single-shot  Needle Type: Stimiplex     Needle Length: 9cm  Needle Gauge: 22     Additional Needles:   Procedures:,,,, ultrasound used (permanent image in chart),,    Narrative:  Start time: 11/16/2021 7:20 AM End time: 11/16/2021 7:23 AM Injection made incrementally with aspirations every 20 mL.  Performed by: Personally  Anesthesiologist: Iran Ouch, MD  Additional Notes: Patient consented for risk and benefits of nerve block including but not limited to nerve damage, failed block, bleeding and infection.  Patient voiced understanding.  Functioning IV was confirmed and monitors were applied.  Timeout done prior to procedure and prior to any sedation being given to the patient.  Patient confirmed procedure site prior to any sedation given to the patient.  A 54m 22ga Stimuplex needle was used. Sterile prep,hand hygiene and sterile gloves were used.  Minimal sedation used for procedure.  No paresthesia endorsed by patient during the procedure.  Negative aspiration and negative test dose prior to incremental administration of local anesthetic. The patient tolerated the procedure well with no immediate complications.

## 2021-11-16 NOTE — Assessment & Plan Note (Signed)
Stable Continue Mirapex

## 2021-11-16 NOTE — Assessment & Plan Note (Signed)
Stable ?Continue Protonix ?

## 2021-11-16 NOTE — Transfer of Care (Signed)
Immediate Anesthesia Transfer of Care Note  Patient: Janee C Dejaynes  Procedure(s) Performed: PTAL - ACHILLES TENDON MICROTENOTOMY (Right) ARTHRODESIS FOOT; TRIPLE (Right: Foot) ARTHRODESIS METATARSAL, T-N, C-C OR MET-CUNNEIFORM, NCJ (Right: Toe)  Patient Location: PACU  Anesthesia Type:General and Regional  Level of Consciousness: drowsy  Airway & Oxygen Therapy: Patient Spontanous Breathing and Patient connected to face mask oxygen  Post-op Assessment: Report given to RN and Post -op Vital signs reviewed and stable  Post vital signs: Reviewed and stable  Last Vitals:  Vitals Value Taken Time  BP    Temp    Pulse    Resp    SpO2      Last Pain:  Vitals:   11/16/21 0629  PainSc: 6          Complications: No notable events documented. 

## 2021-11-16 NOTE — Evaluation (Signed)
Physical Therapy Evaluation Patient Details Name: Claudia Terry MRN: [id number] DOB: [birth year] Today's Date: 11/16/2021  History of Present Illness  76 y.o. female status post triple arthrodesis -talonavicular joint, calcaneocuboid joint, subtalar joint fusion performed by Dr. Luana Shu with past medical history significant for hypertension, restless leg syndrome, GERD.   Clinical Impression  Pt received supine in bed, husband in room, agreeable to therapy. Pt reports living in single level home with husband, ramped entrance was recently installed. She has all DME required for a safe d/c in order to maintain NWB precautions. Pt was able to sit up to EOB with MIN A for light lift and guidance of RLE. Once EOB, pt became very dizzy and was seeing double. She states this is typical following all of her previous orthopedic surgeries. Pt remained sitting for >5 minutes with no symptom resolution. Pt returned to bed with increased assist to lift BLE into bed. Pt was positioned with RLE elevated, ice applied and bone stimulator donned. Educated pt on what will be needed to d/c home safely including single limb STS and transfer using RW. Pt understands plan to assess in next session. Would benefit from skilled PT to address above deficits and promote optimal return to PLOF.      Recommendations for follow up therapy are one component of a multi-disciplinary discharge planning process, led by the attending physician.  Recommendations may be updated based on patient status, additional functional criteria and insurance authorization.  Follow Up Recommendations Home health PT    Assistance Recommended at Discharge Intermittent Supervision/Assistance  Patient can return home with the following  A little help with walking and/or transfers;A little help with bathing/dressing/bathroom;Assistance with cooking/housework;Assist for transportation;Help with stairs or ramp for entrance    Equipment Recommendations None  recommended by PT  Recommendations for Other Services       Functional Status Assessment Patient has had a recent decline in their functional status and demonstrates the ability to make significant improvements in function in a reasonable and predictable amount of time.     Precautions / Restrictions Precautions Precautions: None Restrictions Weight Bearing Restrictions: Yes RLE Weight Bearing: Non weight bearing      Mobility  Bed Mobility Overal bed mobility: Needs Assistance Bed Mobility: Supine to Sit, Sit to Supine     Supine to sit: Min assist, HOB elevated Sit to supine: Mod assist   General bed mobility comments: MIN A to help manage LLE out of bed; MOD A to manage BLE back to bed, pt managed trunk. Unable to scoot EOB.    Transfers                   General transfer comment: deferred due to dizziness    Ambulation/Gait                  Stairs            Wheelchair Mobility    Modified Rankin (Stroke Patients Only)       Balance Overall balance assessment: Needs assistance Sitting-balance support: Bilateral upper extremity supported Sitting balance-Leahy Scale: Fair Sitting balance - Comments: no LOB sitting EOB; pt remained dizzy and seeing double while sitting upright       Standing balance comment: standing deferred                             Pertinent Vitals/Pain Pain Assessment Pain Assessment: No/denies pain    Home  Living Family/patient expects to be discharged to:: Private residence Living Arrangements: Spouse/significant other Available Help at Discharge: Family Type of Home: House Home Access: [place]: One [place]: Conservation officer, nature (2 wheels);Rollator (4 wheels);Cane - single point;BSC/3in1;Grab bars - toilet;Grab bars - tub/shower;Hand held shower head;Wheelchair - manual;Shower seat      Prior Function Prior Level of Function : Independent/Modified  Independent;Driving;History of Falls (last six months)             Mobility Comments: Was using SPC prior to surgery for household and limited community distances; reports 1 fall in the last 6 months in which she tripped, no injuries associated. ADLs Comments: Independent     Hand Dominance        Extremity/Trunk Assessment   Upper Extremity Assessment Upper Extremity Assessment: Overall WFL for tasks assessed    Lower Extremity Assessment Lower Extremity Assessment: RLE deficits/detail RLE Deficits / Details: NT due to recent surgery; is able to perform limited-range SLR; edema throughout RLE RLE: Unable to fully assess due to immobilization       Communication   Communication: No difficulties  Cognition Arousal/Alertness: Awake/alert Behavior During Therapy: WFL for tasks assessed/performed Overall Cognitive Status: Within Functional Limits for tasks assessed                                 General Comments: A&Ox4        General Comments General comments (skin integrity, edema, etc.): edema RLE into thigh    Exercises     Assessment/Plan    PT Assessment Patient needs continued PT services  PT Problem List Decreased strength;Decreased range of motion;Decreased balance;Decreased mobility       PT Treatment Interventions DME instruction;Balance training;Gait training;Functional mobility training;Therapeutic activities;Therapeutic exercise;Patient/family education    PT Goals (Current goals can be found in the Care Plan section)  Acute Rehab PT Goals Patient Stated Goal: to stand and transfer as needed to use w/c once home PT Goal Formulation: With patient/family Time For Goal Achievement: 11/30/21 Potential to Achieve Goals: Good    Frequency 7X/week     Co-evaluation               AM-PAC PT "6 Clicks" Mobility  Outcome Measure Help needed turning from your back to your side while in a flat bed without using bedrails?: A  Little Help needed moving from lying on your back to sitting on the side of a flat bed without using bedrails?: A Little Help needed moving to and from a bed to a chair (including a wheelchair)?: A Little Help needed standing up from a chair using your arms (e.g., wheelchair or bedside chair)?: A Little Help needed to walk in hospital room?: A Little Help needed climbing 3-5 steps with a railing? : Total 6 Click Score: 16    End of Session   Activity Tolerance: Other (comment) (limited by dizziness) Patient left: in bed;with call bell/phone within reach;with family/visitor present Nurse Communication: Mobility status PT Visit Diagnosis: Unsteadiness on feet (R26.81);Muscle weakness (generalized) (M62.81);History of falling (Z91.81);Difficulty in walking, not elsewhere classified (R26.2)    Time: 1446-1510 PT Time Calculation (min) (ACUTE ONLY): 24 min   Charges:   PT Evaluation $PT Eval Moderate Complexity: 1 Mod PT Treatments $Therapeutic Activity: 8-22 mins         Patrina Levering PT, DPT

## 2021-11-16 NOTE — Assessment & Plan Note (Signed)
Stable Continue atenolol

## 2021-11-16 NOTE — Anesthesia Preprocedure Evaluation (Addendum)
Anesthesia Evaluation  Patient identified by MRN, date of birth, ID band Patient awake    Reviewed: Allergy & Precautions, NPO status , Patient's Chart, lab work & pertinent test results, reviewed documented beta blocker date and time   History of Anesthesia Complications Negative for: history of anesthetic complications  Airway Mallampati: II  TM Distance: >3 FB Neck ROM: Full    Dental  (+) Missing,    Pulmonary sleep apnea ,    Pulmonary exam normal        Cardiovascular Exercise Tolerance: Good hypertension, Pt. on medications and Pt. on home beta blockers Normal cardiovascular exam     Neuro/Psych Restless leg syndrome negative psych ROS   GI/Hepatic Neg liver ROS, GERD  Medicated and Controlled,  Endo/Other  BMI 32  Renal/GU negative Renal ROS     Musculoskeletal  (+) Arthritis ,   Abdominal (+) + obese,   Peds  Hematology negative hematology ROS (+)   Anesthesia Other Findings   Reproductive/Obstetrics                            Anesthesia Physical  Anesthesia Plan  ASA: II  Anesthesia Plan: General   Post-op Pain Management: Regional block*   Induction: Intravenous  PONV Risk Score and Plan: 2 and Treatment may vary due to age or medical condition, Ondansetron, Dexamethasone and Midazolam  Airway Management Planned: Oral ETT  Additional Equipment: None  Intra-op Plan:   Post-operative Plan:   Informed Consent: I have reviewed the patients History and Physical, chart, labs and discussed the procedure including the risks, benefits and alternatives for the proposed anesthesia with the patient or authorized representative who has indicated his/her understanding and acceptance.     Dental advisory given  Plan Discussed with: CRNA and Anesthesiologist  Anesthesia Plan Comments:        Anesthesia Quick Evaluation

## 2021-11-16 NOTE — Anesthesia Procedure Notes (Signed)
Procedure Name: Intubation Date/Time: 11/16/2021 7:47 AM  Performed by: Beverely Low, CRNAPre-anesthesia Checklist: Patient identified, Patient being monitored, Timeout performed, Emergency Drugs available and Suction available Patient Re-evaluated:Patient Re-evaluated prior to induction Oxygen Delivery Method: Circle system utilized Preoxygenation: Pre-oxygenation with 100% oxygen Induction Type: IV induction Ventilation: Mask ventilation without difficulty Laryngoscope Size: 3 and McGraph Grade View: Grade I Tube type: Oral Tube size: 7.0 mm Number of attempts: 1 Airway Equipment and Method: Stylet Placement Confirmation: ETT inserted through vocal cords under direct vision, positive ETCO2 and breath sounds checked- equal and bilateral Secured at: 21 cm Tube secured with: Tape Dental Injury: Teeth and Oropharynx as per pre-operative assessment

## 2021-11-16 NOTE — Anesthesia Procedure Notes (Signed)
Anesthesia Regional Block: Popliteal block   Pre-Anesthetic Checklist: , timeout performed,  Correct Patient, Correct Site, Correct Laterality,  Correct Procedure, Correct Position, site marked,  Risks and benefits discussed,  Surgical consent,  Pre-op evaluation,  At surgeon's request and post-op pain management  Laterality: Right  Prep: chloraprep       Needles:  Injection technique: Single-shot  Needle Type: Stimiplex     Needle Length: 9cm  Needle Gauge: 22     Additional Needles:   Procedures:,,,, ultrasound used (permanent image in chart),,    Narrative:  Start time: 11/16/2021 7:23 AM End time: 11/16/2021 7:25 AM Injection made incrementally with aspirations every 20 mL.  Performed by: Personally  Anesthesiologist: Iran Ouch, MD  Additional Notes: Patient consented for risk and benefits of nerve block including but not limited to nerve damage, failed block, bleeding and infection.  Patient voiced understanding.  Functioning IV was confirmed and monitors were applied.  Timeout done prior to procedure and prior to any sedation being given to the patient.  Patient confirmed procedure site prior to any sedation given to the patient.  A 58m 22ga Stimuplex needle was used. Sterile prep,hand hygiene and sterile gloves were used.  Minimal sedation used for procedure.  No paresthesia endorsed by patient during the procedure.  Negative aspiration and negative test dose prior to incremental administration of local anesthetic. The patient tolerated the procedure well with no immediate complications.

## 2021-11-16 NOTE — Anesthesia Postprocedure Evaluation (Addendum)
Anesthesia Post Note  Patient: Claudia Terry  Procedure(s) Performed: PTAL - ACHILLES TENDON MICROTENOTOMY (Right: Foot) ARTHRODESIS FOOT; TRIPLE (Right: Foot) ARTHRODESIS METATARSAL, T-N, C-C OR MET-CUNNEIFORM, NCJ (Right: Toe) HARDWARE REMOVAL (Right: Foot)  Patient location during evaluation: PACU Anesthesia Type: General Level of consciousness: awake and alert Pain management: pain level controlled Vital Signs Assessment: post-procedure vital signs reviewed and stable Respiratory status: spontaneous breathing, nonlabored ventilation and respiratory function stable Cardiovascular status: blood pressure returned to baseline and stable Postop Assessment: no apparent nausea or vomiting Anesthetic complications: no   No notable events documented.   Last Vitals:  Vitals:   11/16/21 1330 11/16/21 1358  BP: 111/68 123/80  Pulse: 70 65  Resp: 13 16  Temp: 36.6 C 36.7 C  SpO2: 100% 99%    Last Pain:  Vitals:   11/16/21 1514  PainSc: 3                  [name] Aviva Kluver

## 2021-11-16 NOTE — Consult Note (Signed)
Initial Consultation Note   Patient: Claudia Terry YNW:[id number] DOB: 06-10-1946 PCP: Derinda Late, MD DOA: 11/16/2021 DOS: the patient was seen and examined on 11/16/2021 Primary service: Caroline More, DPM  Referring physician: Dr Luana Shu Reason for consult: Management of medical problems  Assessment/Plan: Benign essential HTN Stable Continue atenolol  Acid reflux Stable Continue Protonix  Restless leg syndrome Stable Continue Mirapex  Depression Stable Continue venlafaxine    TRH will sign off at present, please call us again when needed.  HPI: Claudia Terry is a 76 y.o. female with past medical history significant for hypertension, restless leg syndrome, GERD who is status post triple arthrodesis -talonavicular joint, calcaneocuboid joint, subtalar joint fusion. Medical consult was requested for management of her medical conditions which include hypertension, GERD and restless leg syndrome. Patient is seen and examined in PACU and denies having any chest pain, no shortness of breath, no headache, no dizziness, no lightheadedness, no fever, no chills, no cough, no abdominal pain, no urinary symptoms or any changes in bowel habits.  Review of Systems: As mentioned in the history of present illness. All other systems reviewed and are negative. 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 Tiffany Kocher   History of 2019 novel coronavirus disease (COVID-19) 01/15/2021   Hypertension    Low back pain 04/1994   Menopause    Osteoarthritis    Restless leg syndrome 1999   Past Surgical History:  Procedure Laterality Date   CATARACT EXTRACTION     CATARACT EXTRACTION W/PHACO Left 08/15/2020   Procedure: CATARACT EXTRACTION PHACO AND INTRAOCULAR LENS PLACEMENT (IOC) LEFT 6.60 00:39.8;  Surgeon: Birder Robson, MD;  Location: [place];  Service: Ophthalmology;  Laterality: Left;   CATARACT EXTRACTION W/PHACO Right 08/29/2020   Procedure: CATARACT EXTRACTION PHACO AND INTRAOCULAR LENS PLACEMENT (IOC) RIGHT 6.94 00:42.3;  Surgeon: Birder Robson, MD;  Location: [place];  Service: Ophthalmology;  Laterality: Right;   [place]   X2   COLONOSCOPY  12/06/2011   normal   COLONOSCOPY WITH PROPOFOL N/A 04/13/2018   Procedure: COLONOSCOPY WITH PROPOFOL;  Surgeon: Manya Silvas, MD;  Location[facility] ENDOSCOPY;  Service: Endoscopy;  Laterality: N/A;   ESOPHAGOGASTRODUODENOSCOPY (EGD) WITH PROPOFOL N/A 02/23/2015   Procedure: ESOPHAGOGASTRODUODENOSCOPY (EGD) WITH PROPOFOL;  Surgeon: Manya Silvas, MD;  Location: [facility] ENDOSCOPY;  Service: Endoscopy;  Laterality: N/A;   ESOPHAGOGASTRODUODENOSCOPY (EGD) WITH PROPOFOL N/A 04/13/2018   Procedure: ESOPHAGOGASTRODUODENOSCOPY (EGD) WITH PROPOFOL;  Surgeon: Manya Silvas, MD;  Location: [facility] ENDOSCOPY;  Service: Endoscopy;  Laterality: N/A;   FOOT SURGERY     with screws   HIP CLOSED REDUCTION Left 08/08/2015   Procedure: CLOSED REDUCTION HIP;  Surgeon: Dereck Leep, MD;  Location: [facility];  Service: Orthopedics;  Laterality: Left;   HIP CLOSED REDUCTION Left 09/18/2015   Procedure: CLOSED REDUCTION HIP;  Surgeon: Hessie Knows, MD;  Location: [facility];  Service: Orthopedics;  Laterality: Left;   HIP CLOSED REDUCTION Left 12/01/2015   Procedure: CLOSED REDUCTION HIP;  Surgeon: Dereck Leep, MD;  Location: [facility];  Service: Orthopedics;  Laterality: Left;  no prep no incision   JOINT REPLACEMENT  10/2016   left knee   KNEE ARTHROPLASTY Left 10/28/2016   Procedure: COMPUTER ASSISTED TOTAL KNEE ARTHROPLASTY;  Surgeon: Dereck Leep, MD;  Location: [facility];  Service: Orthopedics;  Laterality: Left;   KNEE ARTHROPLASTY Right 07/07/2017   Procedure: COMPUTER ASSISTED TOTAL KNEE ARTHROPLASTY;  Surgeon: Dereck Leep, MD;  Location: [facility]  [facility];  Service: Orthopedics;  Laterality: Right;   KNEE ARTHROSCOPY Right 11/14/2014   Procedure: ARTHROSCOPY KNEE;  Surgeon: Dereck Leep, MD;  Location: [facility];  Service: Orthopedics;  Laterality: Right;  Posterior horn menicus, anterior lateral menicus grade 3 chondromalacia   phlebitis groin     right foot surgery     SHOULDER ARTHROSCOPY WITH OPEN ROTATOR CUFF REPAIR Left 03/10/2019   Procedure: LEFT SHOULDER ARTHROSCOPY, ARTHROSCOPIC ROTATOR CUFF REPAIR, DISTAL CLAVICLE EXCISION, SUBACROMIAL DECOMPRESSION, INTRA-ARTICULAR DEBRIDEMENT ;  Surgeon: Lovell Sheehan, MD;  Location: [facility];  Service: Orthopedics;  Laterality: Left;   SHOULDER ARTHROSCOPY WITH ROTATOR CUFF REPAIR Left 06/21/2019   Procedure: SHOULDER ARTHROSCOPY WITH EXTENSIVE INTRAARTICULAR DEBRIDEMENT WITH REMOVAL OF HARDWARE;  Surgeon: Lovell Sheehan, MD;  Location: [place];  Service: Orthopedics;  Laterality: Left;   TONSILLECTOMY     TOTAL HIP ARTHROPLASTY Left 05/08/2011   necrosis of hip   TOTAL HIP ARTHROPLASTY Right 06/14/2015   Procedure: TOTAL HIP ARTHROPLASTY;  Surgeon: Dereck Leep, MD;  Location: [facility];  Service: Orthopedics;  Laterality: Right;   TOTAL HIP REVISION Left 02/21/2016   Procedure: TOTAL HIP REVISION;  Surgeon: Dereck Leep, MD;  Location: [facility];  Service: Orthopedics;  Laterality: Left;   Social History:  reports that she has never smoked. She has never used smokeless tobacco. She reports that she does not drink alcohol and does not use drugs.  Allergies  Allergen Reactions   Penicillins Hives and Other (See Comments)    Has patient had a PCN reaction causing immediate rash, facial/tongue/throat swelling, SOB or lightheadedness with hypotension: No Has patient had a PCN reaction causing severe rash involving mucus membranes or skin necrosis: No Has patient had a PCN reaction that required hospitalization No Has patient had a PCN reaction occurring within the last 10  years: No If all of the above answers are "NO", then may proceed with Cephalosporin use. Other reaction(s): Unknown   Erythromycin Rash   Other Nausea Only    Mycins   Tetracyclines & Related Rash    Family History  Problem Relation Age of Onset   Diabetes Mother    Osteoarthritis Mother    Diabetes Maternal Grandfather    Hypertension Maternal Grandfather    Ovarian cancer Paternal Aunt    Cancer - Lung Paternal Uncle     Prior to Admission medications   Medication Sig Start Date End Date Taking? Authorizing Provider  acetaminophen (TYLENOL) 500 MG tablet Take 1,000 mg by mouth every 6 (six) hours as needed.   Yes [provider]  atenolol (TENORMIN) 50 MG tablet daily. 01/12/19  Yes [provider]  clindamycin (CLEOCIN) 300 MG capsule Take 300 mg by mouth daily.   Yes [provider]  clobetasol cream (TEMOVATE) 0.05 % Apply to itchy bites QD-BID. Avoid the face, groin, and axilla. 06/18/21  Yes Ralene Bathe, MD  diclofenac Sodium (VOLTAREN) 1 % GEL diclofenac 1 % topical gel   Yes [provider]  ibuprofen (ADVIL) 200 MG tablet Take 400 mg by mouth every 6 (six) hours as needed.   Yes [provider]  omeprazole (PRILOSEC) 20 MG capsule Take by mouth. 11/10/18  Yes [provider]  oxyCODONE-acetaminophen (PERCOCET/ROXICET) 5-325 MG tablet Take 1 tablet by mouth  every 4 (four) hours as needed for severe pain. 10/28/21  Yes Paulette Blanch, MD  pramipexole (MIRAPEX) 1.5 MG tablet Take 1.5 mg by mouth at bedtime.    Yes [provider]  traZODone (DESYREL) 50 MG tablet trazodone 50 mg tablet 11/02/19  Yes [provider]  venlafaxine (EFFEXOR) 75 MG tablet Take 75 mg by mouth 1 day or 1 dose.   Yes [provider]  Biotin 5000 MCG TABS Take 10,000 mcg by mouth daily.    [provider]  Calcium Carbonate-Vitamin D (CALTRATE 600+D PO) Take by mouth 2 (two) times daily.    [provider]   Cholecalciferol (VITAMIN D-3 SL) Place 5,000 Units under the tongue.    [provider]  Coenzyme Q10 (CO Q-10) 200 MG CAPS Take 100 mg by mouth daily.    [provider]  Glucosamine HCl-MSM 1500-500 MG/30ML LIQD Take by mouth 2 (two) times daily.    [provider]  Melatonin 10 MG TABS Take 10 mg by mouth at bedtime.    [provider]  Menthol, Topical Analgesic, (BIOFREEZE EX) Apply 1 application topically daily as needed (foot pain). Patient not taking: Reported on 11/12/2021    [provider]  multivitamin-lutein (OCUVITE-LUTEIN) CAPS capsule Take 1 capsule by mouth daily.    [provider]  Red Yeast Rice 600 MG TABS Take 600 mg by mouth 2 (two) times daily.    [provider]  Turmeric 400 MG CAPS Take by mouth daily.    [provider]  Vitamin D, Ergocalciferol, (DRISDOL) 1.25 MG (50000 UNIT) CAPS capsule Take 50,000 Units by mouth every 7 (seven) days.    [provider]    Physical Exam: Vitals:   11/16/21 1230 11/16/21 1245 11/16/21 1300 11/16/21 1315  BP: 105/61 (!) 99/57 107/65 112/67  Pulse: 68 68 67 65  Resp: '18 18 17 17  '$ Temp:      SpO2: 94% 95% 96% 94%   Physical Exam Vitals and nursing note reviewed.  Constitutional:      Appearance: Normal appearance.  HENT:     Head: Normocephalic.     Nose: Nose normal.     Mouth/Throat:     Mouth: Mucous membranes are moist.  Eyes:     Pupils: Pupils are equal, round, and reactive to light.  Cardiovascular:     Rate and Rhythm: Normal rate and regular rhythm.  Pulmonary:     Effort: Pulmonary effort is normal.     Breath sounds: Normal breath sounds.  Abdominal:     General: Abdomen is flat. Bowel sounds are normal.     Palpations: Abdomen is soft.  Musculoskeletal:     Cervical back: Normal range of motion and neck supple.     Comments: Decreased range of motion right ankle  Skin:    General: Skin is warm and dry.  Neurological:      General: No focal deficit present.     Mental Status: She is alert and oriented to person, place, and time.  Psychiatric:        Mood and Affect: Mood normal.        Behavior: Behavior normal.     Data Reviewed:  Relevant notes from primary care and specialist visits, past discharge summaries as available in EHR, including Care Everywhere. Prior diagnostic testing as pertinent to current admission diagnoses Updated medications and problem lists for reconciliation ED course, including vitals, labs, imaging, treatment and response to treatment  Triage notes, nursing and pharmacy notes and ED provider's notes Notable results as noted in HPI  There are no new results to review at this time.    Family Communication: Greater than 50% of time was spent discussing patient's condition and plan of care with her at the bedside.  All questions and concerns have been addressed.  She verbalizes understanding and agrees with Primary team communication:  Thank you very much for involving [place] in the care of your patient.  Author: Collier Bullock, MD 11/16/2021 1:24 PM  For on call review [email or web address].

## 2021-11-16 NOTE — H&P (Signed)
HISTORY AND PHYSICAL INTERVAL NOTE:  11/16/2021  7:33 AM  Claudia Terry  has presented today for surgery, with the diagnosis of M19.071 - Primary osetoarthritis of right foot M21.41 - Acquired pes planus right foot NM21.6X1 - Equinus deformity of right foot.  The various methods of treatment have been discussed with the patient.  No guarantees were given.  After consideration of risks, benefits and other options for treatment, the patient has consented to surgery.  I have reviewed the patients' chart and labs.    Procedure: All right foot TAL Triple arthrodesis - STJ, TNJ, CCJ Removal of hardware NCJ arthrodesis   A history and physical examination was performed in my office.  The patient was reexamined.  There have been no changes to this history and physical examination.  Caroline More, DPM

## 2021-11-16 NOTE — Assessment & Plan Note (Signed)
Stable Continue venlafaxine

## 2021-11-16 NOTE — Op Note (Signed)
PODIATRY / FOOT AND ANKLE SURGERY OPERATIVE REPORT    SURGEON: Caroline More, DPM  ASSISTANT: Samara Deist, DPM  PRE-OPERATIVE DIAGNOSIS: All right foot 1.  Equinus contracture 2.  Osteoarthritis naviculocuneiform joint, talonavicular joint, subtalar joint, calcaneocuboid joint 3.  Pes planus foot type 4.  Posterior tibial tendon dysfunction  POST-OPERATIVE DIAGNOSIS: Same  PROCEDURE(S): All right foot Tendo Achilles lengthening Triple arthrodesis -talonavicular joint, calcaneocuboid joint, subtalar joint fusion Naviculocuneiform fusion Application of bone stimulator Removal of hardware  HEMOSTASIS: Right thigh tourniquet  ANESTHESIA: general  ESTIMATED BLOOD LOSS: 75 cc  FINDING(S): 1.  Severe osteoarthritis present to the navicular cuneiform joint, subtalar joint, talonavicular joint, and calcaneocuboid joint 2.  Severe pes planus foot type  PATHOLOGY/SPECIMEN(S): None  INDICATIONS:   Claudia Terry is a 76 y.o. female who presents with a painful flatfoot deformity to the right foot.  Patient also complains of hammertoes as well as midfoot pain.  Patient has exhausted conservative measures consisting of change in shoe gear, oral and topical medications, steroid injections, orthoses, and bracing along with physical therapy and stretching activities.  Patient continues to have pain discomfort.  Imaging was performed showing severe osteoarthritis to the rear foot as well as midfoot and hammertoe contractures.  Discussed with patient that surgical intervention would have to be staged in which the rear foot would have to be corrected first and the forefoot could be addressed later.  All treatment options were discussed with the patient both conservative and surgical attempts at correction include potential risks and complications at this time patient is elected for surgical intervention consisting of right foot tendo Achilles lengthening, triple arthrodesis, naviculocuneiform joint  fusion, removal of hardware.  No guarantees given.  Consent obtained prior to procedure, all questions answered.  DESCRIPTION: After obtaining full informed written consent, the patient was brought back to the operating room and placed supine upon the operating table.  A preoperative popliteal and saphenous nerve block was performed by anesthesia with 20 cc of half percent Marcaine plain.  The patient received IV antibiotics prior to induction.  After obtaining adequate anesthesia, the patient was prepped and draped in the standard fashion.  An Esmarch bandage was used to exsanguinate the right lower extremity and pneumatic thigh tourniquet was inflated.  Attention was directed to the posterior aspect of the right lower leg where 3 small stab incisions were made along the course of the Achilles tendon that were approximately 3 cm apart.  The central incision was directed centrally and the knife blade was then directed medially thereby releasing a portion of the Achilles tendon and the proximal and distal portions were brought through the central tendon and directed laterally through the tendon thereby creating a hemisection of the Achilles tendon.  The ankle was then dorsiflexed and the foot appeared to get about 10 degrees of dorsiflexion to be within normal limits.  The incision sites were reapproximated well coapted with skin staples.  Attention was then directed to the rear foot area where incisions were marked for the triple arthrodesis and naviculocuneiform joint fusions.  An incision was made from the distal tip of the lateral malleolus to the fourth metatarsal base area.  The incision was deepened through the subcutaneous tissues utilizing sharp and blunt dissection care was taken to identify and retract all vital neural and vascular structures and all venous contributories were cauterized as necessary.  At this time a periosteal incision was made into the sinus tarsi area and full-thickness flap was  then raised  elevating the extensor digitorum brevis muscle belly thereby exposing the sinus tarsi at the operative site.  The peroneal tendons were also identified and retracted plantarly throughout the case.  Dissection was then continued down more deeply and the calcaneocuboid joint was identified.  The subtalar joint was also identified as well.  Periosteal/capsular incisions were made into both areas and the periosteal capsular tissues were dissected free dorsally and plantarly thereby exposing the joints at the operative site.  The previous arthrodesis implant that was placed was able to be visualized deep within the sinus tarsi area.  It appeared to have bone growing around the area.  A curette was used to remove some of the bony ingrowth of the area.  The arthroresis implant was then grasped and removed and passed off the operative site.  There appeared to be significant articular cartilage disease to the subtalar joint and calcaneocuboid joints.  All articular cartilage from the subtalar joint and calcaneocuboid joint were resected and passed off the operative site.  The joint area was flushed with copious amounts normal sterile saline.  The joints were then inspected further for any further cartilaginous areas and none were visualized.  At this time the fenestrating drill bit was then used to fenestrate the joint surface areas at the subtalar joint and calcaneocuboid joint and both joints were packed with bone chips mixed with DBM.  Attention was then directed to the medial aspect of the rear foot where an incision was made from the tip of the medial malleolus and an interval between the tibialis posterior tendon and tibialis anterior tendon down to the first metatarsal base area.  The incision was deepened to the subcutaneous tissues utilizing sharp and blunt dissection care was taken to identify and retract all vital neurovascular structures and all venous contributories were cauterized as necessary.   At this time a full-thickness periosteal and capsular incision was made along the course of the talonavicular joint and naviculocuneiform joints.  Full-thickness flap was elevated dorsally and plantarly thereby exposing the joints at the operative site.  The tibialis anterior tendon and posterior tibial tendons were reflected slightly off the bony insertions for further visualization but the main insertion points were Intact.  There appeared to be a large accessory navicular present at the insertion of the tibialis posterior tendon, this was resected and passed off the operative site preserving the posterior tibial tendon.  A portion of the posterior tibial tendon was reapproximated well coapted with the capsular structures with 3-0 Vicryl suture to reinforce the area.  At this time utilizing joint distractors the joints were visualized at the medial column at both the talonavicular and naviculocuneiform joints. The talonavicular joint and naviculocuneiform joints were inspected and noted to have significant articular cartilage damage with minimal to no cartilage remaining.   The articular cartilage in these areas was resected and passed off the operative site.  A flush was performed with copious amounts normal sterile saline.  No further articular cartilage remained.  The joints were then prepped further with the fenestrating drill bit and packed with DBM with bone chips.  Attention was then directed to the talonavicular joint area which was reduced and pinned in place with temporary fixation as well as the naviculocuneiform joint covering the talar head and also plantar flexing the medial column to restore arch height.  This was done under fluoroscopic guidance and alignment appeared to be excellent overall.  After this was performed attention was then directed to the posterior/plantar heel for the  subtalar joint fusion portion of the procedure.  Under fluoroscopic guidance 2 guidewires were placed parallel  through the posterior inferior aspect of the calcaneus through the calcaneal body across the subtalar joint and into the talus with the appropriate orientation while holding the heel in a fairly rectus to slight valgus position taking care not to overcorrect or cause varus heel.  This was checked on AP, lateral, and axial calcaneal views and was noted to be in excellent position overall.  Small percutaneous stab incisions were made around the entry point of the guidewires to accommodate the screws.  At this time Paragon 28 2 x 7.0 partially-threaded headless compression cannulated screws were placed across the subtalar joint with excellent compression.  The skin incisions were reapproximated with skin staples to the posterior/plantar heel.  The pneumatic thigh tourniquet was let down after this process and a prompt hyperemic response was noted all digits of the right foot.  Attention was then directed to the anterior process of the calcaneus at the calcaneocuboid joint level where 2 Paragon 28 nitinol staples were placed across the calcaneocuboid joint with excellent compression noted while holding the foot still in a rectus position.  The joint surfaces appear to be well compressed at both the subtalar joint and calcaneocuboid joints.  Attention was then directed back to the medial column where a guidewire was placed for a 4.0 cannulated Paragon 28 screw from the navicular tuberosity area across the talonavicular joint and into the head and neck of the talus.  This was done under fluoroscopic guidance.  At this time a 4.0 cannulated Paragon 28 partially-threaded headed screw was placed across the talonavicular joint with excellent compression noted.  Another guidewire for a 4.0 cannulated screw was placed from the navicular tuberosity and across the naviculocuneiform joint into the second cuneiform, this was checked under fluoroscopic guidance in multiple planes and to have good position overall.  At this time  a 4.0 cannulated Paragon 28 screw was placed across the naviculocuneiform joint with excellent compression.  The screw appeared locked down a lot of the naviculocuneiform joint as the first cuneiform appeared to be well opposed to the navicular with good compression overall.  An Esmarch bandage was used to exsanguinate the right lower extremity and the pneumatic thigh tourniquet was once again inflated.  At this time the medial column fusion plate from Paragon 28 was placed over the medial surface of the medial column.  The plate was bent to the size of the bone for the appropriate orientation.  The plate was then placed onto the medial aspect of the talar neck and head, medial navicular and medial cuneiform.  It was temporarily fixated with olive wires.  Fluoroscopic guidance was used to verify correct position of plate which appeared to be excellent.  At this time 2 locking screws were then placed into the talar head/neck area with the appropriate size and length.  3 locking screws were then placed into the navicular with the appropriate orientation and size.  3 locking screws were then placed into the medial cuneiform while holding manual reduction and compression across the area with compression clamp.  Excellent compression appeared to be across the medial column joints including the naviculocuneiform joint and talonavicular joint.  Appeared to be very stable with no gapping or openings.  Overall the reduction appeared to be excellent as the medial column appeared to be well aligned with the talus.  The talar head appeared to be completely covered by the navicular clinically and  radiographically.  The rear foot appeared to be in slight valgus to rectus position.  Final imaging was taken showing these findings.  The surgical sites were flushed with copious amounts normal sterile saline.  The periosteal and capsular structures were reapproximated well coapted with 3-0 Vicryl.  The subcutaneous tissue was  reapproximated well coapted with 4-0 Vicryl.  The skin was then reapproximated well coapted with skin staples.  The pneumatic thigh tourniquet was deflated and a prompt hyperemic response was noted to all digits of the right foot.  A postoperative dressing was then applied consisting of Xeroform to all incision lines followed by 4 x 4 gauze, ABD, Kerlix, Webril, posterior splint, Ace wrap.  Patient tolerated the procedure and anesthesia well and was transferred to the recovery room vital signs stable vascular status intact well to the right foot.  Following a period of postoperative monitoring the patient will be admitted for 23-hour observation.  Medicine team has been consulted for medication management.  Patient is to remain nonweightbearing at all times to the right lower extremity.  PT/OT has been ordered.  We will plan on seeing patient again tomorrow for hopeful discharge at some point in the early afternoon.  Recommend holding on any blood thinner type medications for 24 hours after the surgery.  COMPLICATIONS: None  CONDITION: Good, stable  Caroline More, DPM

## 2021-11-16 NOTE — Plan of Care (Signed)

## 2021-11-17 DIAGNOSIS — M2141 Flat foot [pes planus] (acquired), right foot: Secondary | ICD-10-CM | POA: Diagnosis not present

## 2021-11-17 MED ORDER — SULFAMETHOXAZOLE-TRIMETHOPRIM 800-160 MG PO TABS: 1.0000 | ORAL_TABLET | Freq: Two times a day (BID) | ORAL | 0 refills | Status: AC

## 2021-11-17 MED ORDER — COMMODE BEDSIDE MISC: 1.0000 | 0 refills | Status: AC | PRN

## 2021-11-17 MED ORDER — ASPIRIN 81 MG PO TBEC: 81.0000 mg | DELAYED_RELEASE_TABLET | Freq: Two times a day (BID) | ORAL | 0 refills | Status: AC

## 2021-11-17 MED ORDER — OXYCODONE-ACETAMINOPHEN 7.5-325 MG PO TABS: 1.0000 | ORAL_TABLET | Freq: Four times a day (QID) | ORAL | 0 refills | Status: AC | PRN

## 2021-11-17 NOTE — Discharge Summary (Signed)
Physician Discharge Summary  Patient ID: Claudia Terry MRN: [id number] DOB/AGE: 1945-11-15 76 y.o.  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 Tiffany Kocher   History of 2019 novel coronavirus disease (COVID-19) 01/15/2021   Hypertension    Low back pain 04/1994   Menopause    Osteoarthritis    Restless leg syndrome 1999    Surgeries: Procedure(s): PTAL - ACHILLES TENDON MICROTENOTOMY ARTHRODESIS FOOT; TRIPLE ARTHRODESIS METATARSAL, T-N, C-C OR MET-CUNNEIFORM, [facility] HARDWARE REMOVAL on 11/16/2021   Consultants (if any): Treatment Team:  Collier Bullock, MD  Discharged Condition: Improved  Hospital Course: Claudia Terry is an 76 y.o. female who was admitted 11/16/2021 with a diagnosis of Pain, postoperative, acute and went to the operating room on 11/16/2021 and underwent the above named procedures.    She was given perioperative antibiotics:  Anti-infectives (From admission, onward)    Start     Dose/Rate Route Frequency Ordered Stop   11/17/21 0000  sulfamethoxazole-trimethoprim (BACTRIM DS) 800-160 MG tablet        1 tablet Oral 2 times daily 11/17/21 0945 11/24/21 2359   11/16/21 0619  vancomycin (VANCOCIN) 1-5 GM/200ML-% IVPB       Note to Pharmacy: Trudie Reed S: cabinet override      11/16/21 0619 11/16/21 0812   11/16/21 0600  vancomycin (VANCOCIN) IVPB 1000 mg/200 mL premix        1,000 mg 200 mL/hr over 60 Minutes Intravenous On call to O.R. 11/16/21 0100 11/16/21 0813     .  She was given a bone stimulator to use for bone healing purposes but is to remain  nonweightbearing to the right lower extremity at all times.  Patient is to work on knee flexion and extension exercises daily with calf massages.  She will also start aspirin 81 mg twice daily starting 24 hours after procedure.  She benefited maximally from the hospital stay and there were no complications.    Recent vital signs:  Vitals:   11/17/21 0413 11/17/21 0747  BP: 108/65 111/65  Pulse: 83 81  Resp: 15 18  Temp: 98.2 F (36.8 C) 98.9 F (37.2 C)  SpO2: 92% 96%    Recent laboratory studies:  Lab Results  Component Value Date   HGB 13.0 11/13/2021   HGB 12.5 03/14/2019   HGB 13.9 06/27/2017   Lab Results  Component Value Date   WBC 7.2 11/13/2021   PLT 226 11/13/2021   Lab Results  Component Value Date   INR 1.0 03/05/2019   Lab Results  Component Value Date   NA 140 11/13/2021   K 3.7 11/13/2021   CL 104 11/13/2021   CO2 26 11/13/2021   BUN 15 11/13/2021   CREATININE 0.68 11/13/2021   GLUCOSE 95 11/13/2021    Discharge Medications:   Allergies as of 11/17/2021       Reactions   Penicillins Hives, Other (See Comments)   Has patient had a PCN reaction causing immediate rash, facial/tongue/throat swelling, SOB or lightheadedness with hypotension: No Has patient had a PCN reaction causing severe rash  involving mucus membranes or skin necrosis: No Has patient had a PCN reaction that required hospitalization No Has patient had a PCN reaction occurring within the last 10 years: No If all of the above answers are "NO", then may proceed with Cephalosporin use. Other reaction(s): Unknown   Erythromycin Rash   Other Nausea Only   Mycins   Tetracyclines & Related Rash        Medication List     STOP taking these medications    ibuprofen 200 MG tablet Commonly known as: ADVIL   oxyCODONE-acetaminophen 5-325 MG tablet Commonly known as: PERCOCET/ROXICET Replaced by: oxyCODONE-acetaminophen 7.5-325 MG tablet       TAKE these medications     acetaminophen 500 MG tablet Commonly known as: TYLENOL Take 1,000 mg by mouth every 6 (six) hours as needed.   aspirin EC 81 MG tablet Take 1 tablet (81 mg total) by mouth 2 (two) times daily. Swallow whole.   atenolol 50 MG tablet Commonly known as: TENORMIN daily.   BIOFREEZE EX Apply 1 application topically daily as needed (foot pain).   Biotin 5000 MCG Tabs Take 10,000 mcg by mouth daily.   CALTRATE 600+D PO Take by mouth 2 (two) times daily.   clindamycin 300 MG capsule Commonly known as: CLEOCIN Take 300 mg by mouth daily.   clobetasol cream 0.05 % Commonly known as: TEMOVATE Apply to itchy bites QD-BID. Avoid the face, groin, and axilla.   Co Q-10 200 MG Caps Take 100 mg by mouth daily.   Commode Bedside Misc 1 Device by Does not apply route as needed. 3:1 BSC   diclofenac Sodium 1 % Gel Commonly known as: VOLTAREN diclofenac 1 % topical gel   Glucosamine HCl-MSM 1500-500 MG/30ML Liqd Take by mouth 2 (two) times daily.   Melatonin 10 MG Tabs Take 10 mg by mouth at bedtime.   multivitamin-lutein Caps capsule Take 1 capsule by mouth daily.   omeprazole 20 MG capsule Commonly known as: PRILOSEC Take by mouth.   oxyCODONE-acetaminophen 7.5-325 MG tablet Commonly known as: Percocet Take 1 tablet by mouth every 6 (six) hours as needed for up to 7 days for severe pain. Replaces: oxyCODONE-acetaminophen 5-325 MG tablet   pramipexole 1.5 MG tablet Commonly known as: MIRAPEX Take 1.5 mg by mouth at bedtime.   Red Yeast Rice 600 MG Tabs Take 600 mg by mouth 2 (two) times daily.   sulfamethoxazole-trimethoprim 800-160 MG tablet Commonly known as: BACTRIM DS Take 1 tablet by mouth 2 (two) times daily for 7 days.   traZODone 50 MG tablet Commonly known as: DESYREL trazodone 50 mg tablet   Turmeric 400 MG Caps Take by mouth daily.   venlafaxine 75 MG tablet Commonly known as: EFFEXOR Take 75 mg by mouth 1 day or 1 dose.   Vitamin D  (Ergocalciferol) 1.25 MG (50000 UNIT) Caps capsule Commonly known as: DRISDOL Take 50,000 Units by mouth every 7 (seven) days.   VITAMIN D-3 SL Place 5,000 Units under the tongue.               Durable Medical Equipment  (From admission, onward)           Start     Ordered   11/17/21 0000  For home use only DME Other see comment       Comments: Pure wick urine collection system  Question:  Length of Need  Answer:  6 Months   11/17/21 0945  Discharge Care Instructions  (From admission, onward)           Start     Ordered   11/17/21 0000  Non weight bearing       Question Answer Comment  Laterality right   Extremity Lower      11/17/21 0947            Diagnostic Studies: DG Foot 2 Views Right  Result Date: 11/16/2021 CLINICAL DATA:  Right foot surgery EXAM: RIGHT FOOT - 2 VIEW COMPARISON:  None Available. FINDINGS: Intraoperative images during subtalar fusion. No immediate complication. IMPRESSION: Intraoperative images during subtalar fusion. No immediate complication. Electronically Signed   By: Maurine Simmering M.D.   On: 11/16/2021 10:48   DG C-Arm 1-60 Min-No Report  Result Date: 11/16/2021 Fluoroscopy was utilized by the requesting physician.  No radiographic interpretation.   DG MINI C-ARM IMAGE ONLY  Result Date: 11/16/2021 There is no interpretation for this exam.  This order is for images obtained during a surgical procedure.  Please See "Surgeries" Tab for more information regarding the procedure.   [place] OR NERVE BLOCK-IMAGE ONLY [facility])  Result Date: 11/16/2021 There is no interpretation for this exam.  This order is for images obtained during a surgical procedure.  Please See "Surgeries" Tab for more information regarding the procedure.   DG Ankle Complete Left  Result Date: 10/28/2021 CLINICAL DATA:  76 year old female status post fall. Pain. EXAM: LEFT ANKLE COMPLETE - 3+ VIEW COMPARISON:  Left foot series today. FINDINGS: Chronic  talus ORIF redemonstrated. See foot series today. Mortise joint alignment maintained. Talar dome intact. No evidence of joint effusion. Distal tibia, fibula, and calcaneus appear intact. No acute osseous abnormality identified. Midfoot degeneration redemonstrated, see foot series today. IMPRESSION: No acute fracture or dislocation identified about the left ankle. Chronic talus ORIF. Electronically Signed   By: Genevie Ann M.D.   On: 10/28/2021 06:31   DG Hip Unilat With Pelvis 2-3 Views Left  Result Date: 10/28/2021 CLINICAL DATA:  76 year old female status post fall. Pain. EXAM: DG HIP (WITH OR WITHOUT PELVIS) 2-3V LEFT COMPARISON:  Left hip series 02/21/2016. FINDINGS: Chronic bilateral hip arthroplasty. Pelvis appears stable and intact. Symmetric SI joints. Partially visible bipolar right hip arthroplasty hardware appears stable. Left bipolar type hip arthroplasty hardware appears stable and intact. Proximal left femur appears intact. No acute osseous abnormality identified. Partially visible lumbar spine degeneration. Negative visible lower abdominal and pelvic visceral contours. IMPRESSION: Chronic bilateral hip arthroplasty. No acute fracture or dislocation identified about the left hip or pelvis. Electronically Signed   By: Genevie Ann M.D.   On: 10/28/2021 06:29   DG Foot Complete Left  Result Date: 10/28/2021 CLINICAL DATA:  76 year old female status post fall. Pain. EXAM: LEFT FOOT - COMPLETE 3+ VIEW COMPARISON:  Left foot series 06/03/2020. FINDINGS: Chronic orthopedic screw traversing the inferior talus. Calcaneus appears stable and intact with mild degenerative spurring. There is advanced chronic midfoot degeneration with joint space loss and subchondral sclerosis. Joint spaces and alignment appear stable. No acute osseous abnormality identified. IMPRESSION: 1. No acute fracture or dislocation identified about the left foot. 2. Advanced chronic midfoot degeneration and chronic talus ORIF. Electronically  Signed   By: Genevie Ann M.D.   On: 10/28/2021 06:28    Discharge disposition: 01-Home or Self Care      Patient to follow-up 1 week after surgical date in clinic with Dr. Luana Shu at [facility].  Discharge Instructions     For  home use only DME Other see comment   Complete by: As directed    Pure wick urine collection system   Length of Need: 6 Months   Non weight bearing   Complete by: As directed    Laterality: right   Extremity: Lower        Follow-up Information     Caroline More, DPM. Go in 1 week(s).   Specialty: Podiatry Why: For wound re-check Contact information: [place] [address] [phone]                Signed: Caroline More, DPM 11/17/2021, 1:25 PM

## 2021-11-17 NOTE — Discharge Instructions (Signed)
[place]  POST OPERATIVE INSTRUCTIONS FOR DR. Vickki Muff AND DR. Young Harris   Take your medication as prescribed.  Pain medication should be taken only as needed.  You may also take Tylenol between pain medication doses.  If pain is very severe you may take 1 pain tablet every 4 hours.  If pain is still severe thereafter may you may take 2 pain tablets every 6 hours.  If pain is still severe thereafter you may take 2 pain tablets every 4 hours.  Try to take the pain medication though as prescribed as needed.  Keep the dressing clean, dry and intact.  Remain nonweightbearing at all times to the right lower extremity using knee scooter, crutches, or wheelchair.  Keep your foot elevated above the heart level for the first 48 hours.  Continue elevation thereafter to improve swelling.  You may also apply ice to your foot for maximum 10 minutes out of every 1 hour as needed.  Begin taking aspirin 81 mg twice daily starting day after surgery 24 hours after surgery.  Take antibiotics as prescribed prophylactically until gone.  Do not take a shower. Baths are permissible as long as the foot is kept out of the water.   Every hour you are awake:  Bend your knee 15 times. Massage calf 15 times  Call [facility] [phone]) if any of the following problems occur: You develop a temperature or fever. The bandage becomes saturated with blood. Medication does not stop your pain. Injury of the foot occurs. Any symptoms of infection including redness, odor, or red streaks running from wound.  Shortness of breath, extreme swelling to operative extremity or other extremity.

## 2021-11-17 NOTE — Care Management CC44 (Signed)
Condition Code 44 Documentation Completed  Patient Details  Name: EVVA DIN MRN: [id number] Date of Birth: 04-Feb-1946   Condition Code 44 given:  Yes Patient signature on Condition Code 44 notice:  Yes Documentation of 2 MD's agreement:  Yes Code 44 added to claim:  Yes    Candie Chroman, LCSW 11/17/2021, 8:30 AM

## 2021-11-17 NOTE — Evaluation (Signed)
Occupational Therapy Evaluation Patient Details Name: Claudia Terry MRN: [id number] DOB: February 14, 1946 Today's Date: 11/17/2021   History of Present Illness 76 y.o. female status post triple arthrodesis -talonavicular joint, calcaneocuboid joint, subtalar joint fusion performed by Dr. Luana Shu with past medical history significant for hypertension, restless leg syndrome, GERD.   Clinical Impression   Patient presenting with decreased Ind in self care, balance, functional mobility/transfers, endurance, and safety awareness.Patient reports being able to ambulate short distance with [facility] in home. She reports independence in self care tasks and lives with husband. Pt performs bed mobility with supervision and is able to demonstrate lateral scoots without physical assistance along EOB. Pt stands with RW and min A but unable to take any steps or stand pivot safel to [facility]. OT recommends drop arm BSC that pt can laterally scoot onto at home. We discussed her scooting into wheelchair and getting around in home in that manner. Husband is going to bring wheelchair to room to practice. Pt is incontinent while in bed and tech called to change bed linens.  Patient will benefit from acute OT to increase overall independence in the areas of ADLs, functional mobility, and safety awareness in order to safely discharge home with family.      Recommendations for follow up therapy are one component of a multi-disciplinary discharge planning process, led by the attending physician.  Recommendations may be updated based on patient status, additional functional criteria and insurance authorization.   Follow Up Recommendations  Home health OT    Assistance Recommended at Discharge Intermittent Supervision/Assistance  Patient can return home with the following A little help with walking and/or transfers;A little help with bathing/dressing/bathroom;Assistance with cooking/housework;Assist for transportation;Help with stairs or ramp  for entrance    Functional Status Assessment  Patient has had a recent decline in their functional status and demonstrates the ability to make significant improvements in function in a reasonable and predictable amount of time.  Equipment Recommendations  Other (comment) (drop arm commode chair)       Precautions / Restrictions Precautions Precautions: None Restrictions Weight Bearing Restrictions: Yes RLE Weight Bearing: Non weight bearing      Mobility Bed Mobility Overal bed mobility: Needs Assistance Bed Mobility: Supine to Sit, Sit to Supine     Supine to sit: Supervision Sit to supine: Supervision        Transfers Overall transfer level: Needs assistance Equipment used: Rolling walker (2 wheels) Transfers: Sit to/from Stand Sit to Stand: Min assist                  Balance Overall balance assessment: Needs assistance Sitting-balance support: Bilateral upper extremity supported Sitting balance-Leahy Scale: Fair Sitting balance - Comments: no LOB sitting EOB; pt remained dizzy and seeing double while sitting upright   Standing balance support: Reliant on assistive device for balance, Bilateral upper extremity supported Standing balance-Leahy Scale: Poor                             ADL either performed or assessed with clinical judgement   ADL Overall ADL's : Needs assistance/impaired     Grooming: Wash/dry hands;Wash/dry face;Sitting;Set up;Supervision/safety           Upper Body Dressing : Set up;Minimal assistance;Sitting Upper Body Dressing Details (indicate cue type and reason): min A to fasten bra Lower Body Dressing: Minimal assistance;Sitting/lateral leans Lower Body Dressing Details (indicate cue type and reason): for mesh underwear  Vision Baseline Vision/History: 1 Wears glasses Patient Visual Report: No change from baseline              Pertinent Vitals/Pain Pain Assessment Pain  Assessment: No/denies pain     Hand Dominance Right   Extremity/Trunk Assessment Upper Extremity Assessment Upper Extremity Assessment: Overall WFL for tasks assessed   Lower Extremity Assessment Lower Extremity Assessment: Defer to PT evaluation       Communication Communication Communication: No difficulties   Cognition Arousal/Alertness: Awake/alert Behavior During Therapy: WFL for tasks assessed/performed Overall Cognitive Status: Within Functional Limits for tasks assessed                                 General Comments: A&Ox4                Home Living Family/patient expects to be discharged to:: Private residence Living Arrangements: Spouse/significant other Available Help at Discharge: Family Type of Home: House Home Access: Ramped entrance     Home Layout: One level     Bathroom Shower/Tub: Teacher, early years/pre: Handicapped height     Home Equipment: Conservation officer, nature (2 wheels);Rollator (4 wheels);Cane - single point;BSC/3in1;Grab bars - toilet;Grab bars - tub/shower;Hand held shower head;Wheelchair - manual;Shower seat          Prior Functioning/Environment Prior Level of Function : Independent/Modified Independent;Driving;History of Falls (last six months)             Mobility Comments: Was using SPC prior to surgery for household and limited community distances; reports 1 fall in the last 6 months in which she tripped, no injuries associated. ADLs Comments: Independent        OT Problem List: Decreased strength;Cardiopulmonary status limiting activity;Decreased activity tolerance;Impaired balance (sitting and/or standing);Decreased safety awareness;Decreased knowledge of use of DME or AE;Decreased knowledge of precautions      OT Treatment/Interventions: Self-care/ADL training;Balance training;Therapeutic exercise;Therapeutic activities;Energy conservation;DME and/or AE instruction;Patient/family education;Manual  therapy    OT Goals(Current goals can be found in the care plan section) Acute Rehab OT Goals Patient Stated Goal: to go home OT Goal Formulation: With patient/family Time For Goal Achievement: 12/01/21 Potential to Achieve Goals: Good ADL Goals Pt Will Perform Grooming: with modified independence;sitting Pt Will Perform Lower Body Dressing: with set-up;with supervision;sitting/lateral leans Pt Will Transfer to Toilet: with supervision;bedside commode Pt Will Perform Toileting - Clothing Manipulation and hygiene: with supervision  OT Frequency: Min 2X/week       AM-PAC OT "6 Clicks" Daily Activity     Outcome Measure Help from another person eating meals?: None Help from another person taking care of personal grooming?: A Little Help from another person toileting, which includes using toliet, bedpan, or urinal?: A Lot Help from another person bathing (including washing, rinsing, drying)?: A Little Help from another person to put on and taking off regular upper body clothing?: None Help from another person to put on and taking off regular lower body clothing?: A Lot 6 Click Score: 18   End of Session Equipment Utilized During Treatment: Rolling walker (2 wheels) Nurse Communication: Mobility status  Activity Tolerance: Patient tolerated treatment well Patient left: in bed;with call bell/phone within reach;with family/visitor present  OT Visit Diagnosis: Unsteadiness on feet (R26.81);Muscle weakness (generalized) (M62.81)                Time: 1224-8250 OT Time Calculation (min): 27 min Charges:  OT General Charges $OT Visit: 1  Visit OT Evaluation $OT Eval Moderate Complexity: 1 Mod OT Treatments $Self Care/Home Management : 8-22 mins  Darleen Crocker, MS, OTR/L , CBIS ascom [phone]  11/17/21, 1:10 PM

## 2021-11-17 NOTE — Progress Notes (Signed)
Physical Therapy Treatment Patient Details Name: Claudia Terry MRN: [id number] DOB: 10-26-1945 Today's Date: 11/17/2021   History of Present Illness 76 y.o. female status post triple arthrodesis -talonavicular joint, calcaneocuboid joint, subtalar joint fusion performed by Dr. Luana Shu with past medical history significant for hypertension, restless leg syndrome, GERD.    PT Comments    Pt pleasant and motivated t/o session.  She was aware of and adherent to [place] t/o the session.  She was able to transfer to/from wheelchair w/o direct assist needing VCs and supervision but managed transitions safely.  Discussed many aspects of w/c management, mobility strategies, introduced light exercises and answered questions/educated on mobility implications etc.  Pt is safe to return home, displays ability to transfer w/o direct assist. Will be NWBing x12 weeks and she is aware that she needs to respect this to allow healing.     Recommendations for follow up therapy are one component of a multi-disciplinary discharge planning process, led by the attending physician.  Recommendations may be updated based on patient status, additional functional criteria and insurance authorization.  Follow Up Recommendations  Follow physician's recommendations for discharge plan and follow up therapies     Assistance Recommended at Discharge Intermittent Supervision/Assistance  Patient can return home with the following A little help with walking and/or transfers;A little help with bathing/dressing/bathroom;Assistance with cooking/housework;Assist for transportation;Help with stairs or ramp for entrance   Equipment Recommendations   (drop arm BSC)    Recommendations for Other Services       Precautions / Restrictions Precautions Precautions: Fall Restrictions Weight Bearing Restrictions: Yes RLE Weight Bearing: Non weight bearing     Mobility  Bed Mobility Overal bed mobility: Needs Assistance Bed Mobility:  Supine to Sit     Supine to sit: Supervision     General bed mobility comments: able to move/lift L LE w/o assist today    Transfers Overall transfer level: Needs assistance Equipment used: Rolling walker (2 wheels) Transfers: Sit to/from Stand, Bed to chair/wheelchair/BSC Sit to Stand: Min assist     Squat pivot transfers: Min guard    Lateral/Scoot Transfers: Min guard General transfer comment: Pt was able to get herself to/from bed/wheelchair w/o direct assist.  Pt lacks a lot of ROM/strenvth in L shoulder but did use it effectively to scoot/pivot safely to/from similar height surfaces.  With great effort and pently of cuing for positioning and sequencing she did get to standing with only light assist.  Highly reliant on UEs to maintain standing    Ambulation/Gait               General Gait Details: deferred as she could not maintain NWBing while L LE was unweighted   Stairs             Wheelchair Mobility    Modified Rankin (Stroke Patients Only)       Balance Overall balance assessment: Needs assistance Sitting-balance support: Bilateral upper extremity supported Sitting balance-Leahy Scale: Fair Sitting balance - Comments: no LOB sitting EOB; pt remained dizzy and seeing double while sitting upright   Standing balance support: Reliant on assistive device for balance, Bilateral upper extremity supported Standing balance-Leahy Scale: Poor Standing balance comment: standing deferred                            Cognition Arousal/Alertness: Awake/alert Behavior During Therapy: WFL for tasks assessed/performed Overall Cognitive Status: Within Functional Limits for tasks assessed  Exercises      General Comments General comments (skin integrity, edema, etc.): education on w/c parts, use, positioning and other considerations.  Also discussed in-home management/safety, car transfers  and course of recovery implications      Pertinent Vitals/Pain Pain Assessment Pain Assessment: No/denies pain    Home Living Family/patient expects to be discharged to:: Private residence Living Arrangements: Spouse/significant other Available Help at Discharge: Family Type of Home: House Home Access: Ramped entrance       Home Layout: One level Home Equipment: Conservation officer, nature (2 wheels);Rollator (4 wheels);Cane - single point;BSC/3in1;Grab bars - toilet;Grab bars - tub/shower;Hand held shower head;Wheelchair - manual;Shower seat      Prior Function            PT Goals (current goals can now be found in the care plan section) Progress towards PT goals: Progressing toward goals    Frequency    7X/week      PT Plan Current plan remains appropriate    Co-evaluation              AM-PAC PT "6 Clicks" Mobility   Outcome Measure  Help needed turning from your back to your side while in a flat bed without using bedrails?: A Little Help needed moving from lying on your back to sitting on the side of a flat bed without using bedrails?: A Little Help needed moving to and from a bed to a chair (including a wheelchair)?: A Little Help needed standing up from a chair using your arms (e.g., wheelchair or bedside chair)?: A Little Help needed to walk in hospital room?: A Little Help needed climbing 3-5 steps with a railing? : Total 6 Click Score: 16    End of Session Equipment Utilized During Treatment: Gait belt Activity Tolerance: Patient tolerated treatment well Patient left: with bed alarm set;with call bell/phone within reach;with family/visitor present Nurse Communication: Mobility status PT Visit Diagnosis: Unsteadiness on feet (R26.81);Muscle weakness (generalized) (M62.81);History of falling (Z91.81);Difficulty in walking, not elsewhere classified (R26.2)     Time: 0539-7673 PT Time Calculation (min) (ACUTE ONLY): 33 min  Charges:  $Therapeutic Activity:  23-37 mins                     Kreg Shropshire, DPT 11/17/2021, 2:36 PM

## 2021-11-17 NOTE — Care Plan (Signed)
This 76 years old female with PMH significant for HTN, restless leg syndrome, GERD who is status post triple arthrodesis-talonavicular joint, calcaneocuboid joint, subtalar joint fusion.  Medical consult was requested for management of her medical condition.  Her blood pressure is well controlled.  Continue other medications.  Patient is cleared medically for discharge. She is pending PT/OT evaluation.  We will sign off.  Please reconsult if needed.

## 2021-11-17 NOTE — Progress Notes (Addendum)
1400 Pt d/c instructions reviewed with pt and husband. IV removed all questions answered. Waiting for DME equipment   1513 Pt states she does not want to wait anymore to see where equipment will be delivered to. States she will call on Monday to check on equipment

## 2021-11-17 NOTE — Care Management Obs Status (Deleted)
[place] NOTIFICATION   Patient Details  Name: BLONDIE RIGGSBEE MRN: [id number] Date of Birth: May 14, 1946   Medicare Observation Status Notification Given:  Yes    Adelene Amas, LCSW 11/17/2021, 8:23 AM

## 2021-11-17 NOTE — Care Management Obs Status (Signed)
[place] NOTIFICATION   Patient Details  Name: Claudia Terry MRN: [id number] Date of Birth: 1945-08-19   Medicare Observation Status Notification Given:  Yes    Adelene Amas, LCSW 11/17/2021, 8:23 AM

## 2021-11-17 NOTE — TOC Transition Note (Signed)
Transition of Care [facility]) - Progression Note    Patient Details  Name: Claudia Terry MRN: [id number] Date of Birth: 1946-03-14  Transition of [facility]) CM/SW Contact  Adelene Amas, LCSW Phone Number:[phone] 11/17/2021, 8:45 AM  Clinical Narrative:     CSW spoke with patient and gave her Code 44 information.  Patient verbalized understanding.  Patient does not have any DME needs, and Attending informed patient she would not participate therapy for six weeks. Attending will speak with patient and confirm if RN home health service are needed. Patient's spouse Deskins,Richard [phone], will transport patient home.        Expected Discharge Plan and Services                                                 Social Determinants of Health (SDOH) Interventions    Readmission Risk Interventions     No data to display

## 2021-11-17 NOTE — Progress Notes (Signed)
PODIATRY / FOOT AND ANKLE SURGERY PROGRESS NOTE   HPI: Claudia Terry is a 76 y.o. female who presents status post 1 day right triple arthrodesis, tendo Achilles lengthening, navicular cuneiform joint fusion, removal of hardware.  Patient notes no overnight issues and pain is tolerable at this time with current pain medication.  Patient does not require IV pain medicine.  Patient did have a little strikethrough through her bandage so additional ABDs were applied with tape.  Patient presents today with ice on her foot resting and the bone stimulator on and applied properly.  Patient currently denies nausea, vomiting, fevers, chills.  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 Tiffany Kocher   History of 2019 novel coronavirus disease (COVID-19) 01/15/2021   Hypertension    Low back pain 04/1994   Menopause    Osteoarthritis    Restless leg syndrome 1999    Surgical Hx:  Past Surgical History:  Procedure Laterality Date   CATARACT EXTRACTION     CATARACT EXTRACTION W/PHACO Left 08/15/2020   Procedure: CATARACT EXTRACTION PHACO AND INTRAOCULAR LENS PLACEMENT (IOC) LEFT 6.60 00:39.8;  Surgeon: Birder Robson, MD;  Location: [place];  Service: Ophthalmology;  Laterality: Left;   CATARACT EXTRACTION W/PHACO Right 08/29/2020   Procedure: CATARACT EXTRACTION PHACO AND INTRAOCULAR LENS PLACEMENT (IOC) RIGHT 6.94 00:42.3;  Surgeon: Birder Robson, MD;  Location: [place];  Service: Ophthalmology;  Laterality: Right;   [place]   X2   COLONOSCOPY  12/06/2011   normal   COLONOSCOPY WITH PROPOFOL N/A 04/13/2018   Procedure: COLONOSCOPY WITH PROPOFOL;  Surgeon: Manya Silvas, MD;  Location: [facility] ENDOSCOPY;  Service: Endoscopy;  Laterality: N/A;   ESOPHAGOGASTRODUODENOSCOPY (EGD) WITH PROPOFOL N/A 02/23/2015    Procedure: ESOPHAGOGASTRODUODENOSCOPY (EGD) WITH PROPOFOL;  Surgeon: Manya Silvas, MD;  Location: [facility] ENDOSCOPY;  Service: Endoscopy;  Laterality: N/A;   ESOPHAGOGASTRODUODENOSCOPY (EGD) WITH PROPOFOL N/A 04/13/2018   Procedure: ESOPHAGOGASTRODUODENOSCOPY (EGD) WITH PROPOFOL;  Surgeon: Manya Silvas, MD;  Location: [facility] ENDOSCOPY;  Service: Endoscopy;  Laterality: N/A;   FOOT SURGERY     with screws   HIP CLOSED REDUCTION Left 08/08/2015   Procedure: CLOSED REDUCTION HIP;  Surgeon: Dereck Leep, MD;  Location: [facility];  Service: Orthopedics;  Laterality: Left;   HIP CLOSED REDUCTION Left 09/18/2015   Procedure: CLOSED REDUCTION HIP;  Surgeon: Hessie Knows, MD;  Location: [facility];  Service: Orthopedics;  Laterality: Left;   HIP CLOSED REDUCTION Left 12/01/2015   Procedure: CLOSED REDUCTION HIP;  Surgeon: Dereck Leep, MD;  Location: [facility];  Service: Orthopedics;  Laterality: Left;  no prep no incision   JOINT REPLACEMENT  10/2016   left knee   KNEE ARTHROPLASTY Left 10/28/2016   Procedure: COMPUTER ASSISTED TOTAL KNEE ARTHROPLASTY;  Surgeon: Dereck Leep, MD;  Location: [facility];  Service: Orthopedics;  Laterality: Left;   KNEE ARTHROPLASTY Right 07/07/2017   Procedure: COMPUTER ASSISTED TOTAL KNEE ARTHROPLASTY;  Surgeon: Dereck Leep, MD;  Location: [facility];  Service: Orthopedics;  Laterality: Right;   KNEE ARTHROSCOPY Right 11/14/2014   Procedure: ARTHROSCOPY KNEE;  Surgeon: Dereck Leep, MD;  Location: [facility];  Service: Orthopedics;  Laterality: Right;  Posterior horn menicus, anterior lateral menicus grade 3 chondromalacia   phlebitis groin  right foot surgery     SHOULDER ARTHROSCOPY WITH OPEN ROTATOR CUFF REPAIR Left 03/10/2019   Procedure: LEFT SHOULDER ARTHROSCOPY, ARTHROSCOPIC ROTATOR CUFF REPAIR, DISTAL CLAVICLE EXCISION, SUBACROMIAL DECOMPRESSION, INTRA-ARTICULAR DEBRIDEMENT ;  Surgeon: Lovell Sheehan, MD;  Location: [facility];  Service: Orthopedics;   Laterality: Left;   SHOULDER ARTHROSCOPY WITH ROTATOR CUFF REPAIR Left 06/21/2019   Procedure: SHOULDER ARTHROSCOPY WITH EXTENSIVE INTRAARTICULAR DEBRIDEMENT WITH REMOVAL OF HARDWARE;  Surgeon: Lovell Sheehan, MD;  Location: [place];  Service: Orthopedics;  Laterality: Left;   TONSILLECTOMY     TOTAL HIP ARTHROPLASTY Left 05/08/2011   necrosis of hip   TOTAL HIP ARTHROPLASTY Right 06/14/2015   Procedure: TOTAL HIP ARTHROPLASTY;  Surgeon: Dereck Leep, MD;  Location: [facility];  Service: Orthopedics;  Laterality: Right;   TOTAL HIP REVISION Left 02/21/2016   Procedure: TOTAL HIP REVISION;  Surgeon: Dereck Leep, MD;  Location: [facility];  Service: Orthopedics;  Laterality: Left;    FHx:  Family History  Problem Relation Age of Onset   Diabetes Mother    Osteoarthritis Mother    Diabetes Maternal Grandfather    Hypertension Maternal Grandfather    Ovarian cancer Paternal Aunt    Cancer - Lung Paternal Uncle     Social History:  reports that she has never smoked. She has never used smokeless tobacco. She reports that she does not drink alcohol and does not use drugs.  Allergies:  Allergies  Allergen Reactions   Penicillins Hives and Other (See Comments)    Has patient had a PCN reaction causing immediate rash, facial/tongue/throat swelling, SOB or lightheadedness with hypotension: No Has patient had a PCN reaction causing severe rash involving mucus membranes or skin necrosis: No Has patient had a PCN reaction that required hospitalization No Has patient had a PCN reaction occurring within the last 10 years: No If all of the above answers are "NO", then may proceed with Cephalosporin use. Other reaction(s): Unknown   Erythromycin Rash   Other Nausea Only    Mycins   Tetracyclines & Related Rash    Medications Prior to Admission  Medication Sig Dispense Refill   acetaminophen (TYLENOL) 500 MG tablet Take 1,000 mg by mouth every 6 (six) hours as needed.      atenolol (TENORMIN) 50 MG tablet daily.     clindamycin (CLEOCIN) 300 MG capsule Take 300 mg by mouth daily.     clobetasol cream (TEMOVATE) 0.05 % Apply to itchy bites QD-BID. Avoid the face, groin, and axilla. 60 g 0   diclofenac Sodium (VOLTAREN) 1 % GEL diclofenac 1 % topical gel     ibuprofen (ADVIL) 200 MG tablet Take 400 mg by mouth every 6 (six) hours as needed.     omeprazole (PRILOSEC) 20 MG capsule Take by mouth.     oxyCODONE-acetaminophen (PERCOCET/ROXICET) 5-325 MG tablet Take 1 tablet by mouth every 4 (four) hours as needed for severe pain. 20 tablet 0   pramipexole (MIRAPEX) 1.5 MG tablet Take 1.5 mg by mouth at bedtime.      traZODone (DESYREL) 50 MG tablet trazodone 50 mg tablet     venlafaxine (EFFEXOR) 75 MG tablet Take 75 mg by mouth 1 day or 1 dose.     Biotin 5000 MCG TABS Take 10,000 mcg by mouth daily.     Calcium Carbonate-Vitamin D (CALTRATE 600+D PO) Take by mouth 2 (two) times daily.     Cholecalciferol (VITAMIN D-3 SL) Place 5,000 Units under the tongue.  Coenzyme Q10 (CO Q-10) 200 MG CAPS Take 100 mg by mouth daily.     Glucosamine HCl-MSM 1500-500 MG/30ML LIQD Take by mouth 2 (two) times daily.     Melatonin 10 MG TABS Take 10 mg by mouth at bedtime.     Menthol, Topical Analgesic, (BIOFREEZE EX) Apply 1 application topically daily as needed (foot pain). (Patient not taking: Reported on 11/12/2021)     multivitamin-lutein (OCUVITE-LUTEIN) CAPS capsule Take 1 capsule by mouth daily.     Red Yeast Rice 600 MG TABS Take 600 mg by mouth 2 (two) times daily.     Turmeric 400 MG CAPS Take by mouth daily.     Vitamin D, Ergocalciferol, (DRISDOL) 1.25 MG (50000 UNIT) CAPS capsule Take 50,000 Units by mouth every 7 (seven) days.      Physical Exam: General: Alert and oriented.  No apparent distress.  Splint to the right lower extremity appears to be clean, dry, and intact, upon removal of a couple of the ABD pads around the heel does reveal dried blood, no active  bleeding present across the area.  Patient able to move toes to the right foot but does have some numbness to digits as expected due to the block that was performed with Exparel.  Capillary fill time intact to digits to the right foot.  No results found for this or any previous visit (from the past 48 hour(s)). DG Foot 2 Views Right  Result Date: 11/16/2021 CLINICAL DATA:  Right foot surgery EXAM: RIGHT FOOT - 2 VIEW COMPARISON:  None Available. FINDINGS: Intraoperative images during subtalar fusion. No immediate complication. IMPRESSION: Intraoperative images during subtalar fusion. No immediate complication. Electronically Signed   By: Maurine Simmering M.D.   On: 11/16/2021 10:48   DG C-Arm 1-60 Min-No Report  Result Date: 11/16/2021 Fluoroscopy was utilized by the requesting physician.  No radiographic interpretation.   DG MINI C-ARM IMAGE ONLY  Result Date: 11/16/2021 There is no interpretation for this exam.  This order is for images obtained during a surgical procedure.  Please See "Surgeries" Tab for more information regarding the procedure.   [place] OR NERVE BLOCK-IMAGE ONLY [facility])  Result Date: 11/16/2021 There is no interpretation for this exam.  This order is for images obtained during a surgical procedure.  Please See "Surgeries" Tab for more information regarding the procedure.    Blood pressure 111/65, pulse 81, temperature 98.9 F (37.2 C), resp. rate 18, last menstrual period 02/08/2005, SpO2 96 %.   Assessment Status post right triple arthrodesis, tendo Achilles lengthening, naviculocuneiform joint fusion  Plan -Patient seen and examined. -Patient appears to be doing well today.   -Patient is to work with physical therapy today 1 more time before discharge.  If patient is doing well with PT then can be discharged for follow-up within 1 week of discharge date in clinic. -Upon discharge will write orders for Bactrim 7-day course, Percocet 5/325 1 p.o. every 6 hours as needed pain 1  week course, aspirin 81 mg twice daily starting today. -Also will try to place order for [employer] urine collection system per patient request.  Unsure if this will be a covered entity will but will write an order for this for patient. -Stressed importance of nonweightbearing at all times to the right lower extremity.  Patient will have to use knee scooter, wheelchair, or crutches to maintain nonweightbearing at all times to the right lower extremity.  Patient to keep dressings clean, dry, and intact for the next week  until clinical visit.  We will place discharge order after patient has been seen by physical therapy.  Discussed with nursing.  Caroline More, DPM 11/17/2021, 9:30 AM

## 2021-11-21 ENCOUNTER — Encounter: Payer: Self-pay | Admitting: Podiatry

## 2021-11-21 DIAGNOSIS — M19071 Primary osteoarthritis, right ankle and foot: Secondary | ICD-10-CM | POA: Diagnosis not present

## 2021-11-21 DIAGNOSIS — M2041 Other hammer toe(s) (acquired), right foot: Secondary | ICD-10-CM | POA: Diagnosis not present

## 2021-11-21 DIAGNOSIS — M79671 Pain in right foot: Secondary | ICD-10-CM | POA: Diagnosis not present

## 2021-11-21 DIAGNOSIS — M76821 Posterior tibial tendinitis, right leg: Secondary | ICD-10-CM | POA: Diagnosis not present

## 2021-11-23 ENCOUNTER — Encounter: Payer: Self-pay | Admitting: Podiatry

## 2021-11-28 DIAGNOSIS — M19071 Primary osteoarthritis, right ankle and foot: Secondary | ICD-10-CM | POA: Diagnosis not present

## 2021-11-28 DIAGNOSIS — M79671 Pain in right foot: Secondary | ICD-10-CM | POA: Diagnosis not present

## 2021-11-28 DIAGNOSIS — Z09 Encounter for follow-up examination after completed treatment for conditions other than malignant neoplasm: Secondary | ICD-10-CM | POA: Diagnosis not present

## 2021-11-28 DIAGNOSIS — M76821 Posterior tibial tendinitis, right leg: Secondary | ICD-10-CM | POA: Diagnosis not present

## 2021-12-05 DIAGNOSIS — M19071 Primary osteoarthritis, right ankle and foot: Secondary | ICD-10-CM | POA: Diagnosis not present

## 2021-12-05 DIAGNOSIS — M76821 Posterior tibial tendinitis, right leg: Secondary | ICD-10-CM | POA: Diagnosis not present

## 2021-12-25 DIAGNOSIS — M19071 Primary osteoarthritis, right ankle and foot: Secondary | ICD-10-CM | POA: Diagnosis not present

## 2021-12-25 DIAGNOSIS — M76821 Posterior tibial tendinitis, right leg: Secondary | ICD-10-CM | POA: Diagnosis not present

## 2021-12-25 DIAGNOSIS — M79671 Pain in right foot: Secondary | ICD-10-CM | POA: Diagnosis not present

## 2022-01-08 DIAGNOSIS — M25571 Pain in right ankle and joints of right foot: Secondary | ICD-10-CM | POA: Diagnosis not present

## 2022-01-08 DIAGNOSIS — G8929 Other chronic pain: Secondary | ICD-10-CM | POA: Diagnosis not present

## 2022-01-09 DIAGNOSIS — M2141 Flat foot [pes planus] (acquired), right foot: Secondary | ICD-10-CM | POA: Diagnosis not present

## 2022-01-09 DIAGNOSIS — M19071 Primary osteoarthritis, right ankle and foot: Secondary | ICD-10-CM | POA: Diagnosis not present

## 2022-01-09 DIAGNOSIS — M216X2 Other acquired deformities of left foot: Secondary | ICD-10-CM | POA: Diagnosis not present

## 2022-01-09 DIAGNOSIS — M76821 Posterior tibial tendinitis, right leg: Secondary | ICD-10-CM | POA: Diagnosis not present

## 2022-01-09 DIAGNOSIS — M2142 Flat foot [pes planus] (acquired), left foot: Secondary | ICD-10-CM | POA: Diagnosis not present

## 2022-01-09 DIAGNOSIS — M216X1 Other acquired deformities of right foot: Secondary | ICD-10-CM | POA: Diagnosis not present

## 2022-01-09 DIAGNOSIS — M2041 Other hammer toe(s) (acquired), right foot: Secondary | ICD-10-CM | POA: Diagnosis not present

## 2022-01-09 DIAGNOSIS — M2042 Other hammer toe(s) (acquired), left foot: Secondary | ICD-10-CM | POA: Diagnosis not present

## 2022-01-09 DIAGNOSIS — Z09 Encounter for follow-up examination after completed treatment for conditions other than malignant neoplasm: Secondary | ICD-10-CM | POA: Diagnosis not present

## 2022-01-09 DIAGNOSIS — M79671 Pain in right foot: Secondary | ICD-10-CM | POA: Diagnosis not present

## 2022-01-23 ENCOUNTER — Other Ambulatory Visit: Payer: Self-pay | Admitting: Podiatry

## 2022-01-23 DIAGNOSIS — R739 Hyperglycemia, unspecified: Secondary | ICD-10-CM | POA: Diagnosis not present

## 2022-01-23 DIAGNOSIS — M19071 Primary osteoarthritis, right ankle and foot: Secondary | ICD-10-CM

## 2022-01-23 DIAGNOSIS — E7800 Pure hypercholesterolemia, unspecified: Secondary | ICD-10-CM | POA: Diagnosis not present

## 2022-01-23 DIAGNOSIS — Z79899 Other long term (current) drug therapy: Secondary | ICD-10-CM | POA: Diagnosis not present

## 2022-01-23 DIAGNOSIS — L97411 Non-pressure chronic ulcer of right heel and midfoot limited to breakdown of skin: Secondary | ICD-10-CM | POA: Diagnosis not present

## 2022-01-23 NOTE — Progress Notes (Signed)
Needs to include rearfoot joints (STJ, TNJ, CCJ, NCJ, TMTJs)

## 2022-01-29 DIAGNOSIS — Z1331 Encounter for screening for depression: Secondary | ICD-10-CM | POA: Diagnosis not present

## 2022-01-29 DIAGNOSIS — Z0000 Encounter for general adult medical examination without abnormal findings: Secondary | ICD-10-CM | POA: Diagnosis not present

## 2022-01-30 ENCOUNTER — Ambulatory Visit
Admission: RE | Admit: 2022-01-30 | Discharge: 2022-01-30 | Disposition: A | Discharge status: Home / Self Care | Payer: PPO | Source: Ambulatory Visit | Attending: Podiatry | Admitting: Podiatry

## 2022-01-30 ENCOUNTER — Other Ambulatory Visit: Payer: PPO

## 2022-01-30 DIAGNOSIS — M19071 Primary osteoarthritis, right ankle and foot: Secondary | ICD-10-CM | POA: Diagnosis not present

## 2022-02-08 DIAGNOSIS — M19071 Primary osteoarthritis, right ankle and foot: Secondary | ICD-10-CM | POA: Diagnosis not present

## 2022-02-19 ENCOUNTER — Telehealth: Payer: Self-pay | Admitting: Physical Therapy

## 2022-02-19 NOTE — Telephone Encounter (Signed)
Called pt to see if she wanted to come in earlier for evaluation because of increased availability in schedule. Pt did not pick up so left VM instructing pt to call back.

## 2022-02-20 ENCOUNTER — Ambulatory Visit: Payer: PPO | Attending: Podiatry | Admitting: Physical Therapy

## 2022-02-20 DIAGNOSIS — M25675 Stiffness of left foot, not elsewhere classified: Secondary | ICD-10-CM | POA: Insufficient documentation

## 2022-02-20 DIAGNOSIS — R262 Difficulty in walking, not elsewhere classified: Secondary | ICD-10-CM | POA: Diagnosis not present

## 2022-02-20 DIAGNOSIS — R2689 Other abnormalities of gait and mobility: Secondary | ICD-10-CM | POA: Diagnosis not present

## 2022-02-20 DIAGNOSIS — M6281 Muscle weakness (generalized): Secondary | ICD-10-CM | POA: Insufficient documentation

## 2022-02-20 DIAGNOSIS — M79672 Pain in left foot: Secondary | ICD-10-CM | POA: Insufficient documentation

## 2022-02-20 DIAGNOSIS — M25674 Stiffness of right foot, not elsewhere classified: Secondary | ICD-10-CM

## 2022-02-20 DIAGNOSIS — M79671 Pain in right foot: Secondary | ICD-10-CM | POA: Insufficient documentation

## 2022-02-20 NOTE — Therapy (Signed)
OUTPATIENT PHYSICAL THERAPY LOWER EXTREMITY EVALUATION   Patient Name: Claudia Terry MRN: [id number] DOB:[birth year], 76 y.o., female Today's Date: 02/21/2022   PT End of Session - 02/20/22 1726     Visit Number 1    Number of Visits 20    Date for PT Re-Evaluation 05/01/22    Authorization Type HTA [id number]    Authorization - Visit Number 1    Authorization - Number of Visits 20    Progress Note Due on Visit 10    PT Start Time 1330    PT Stop Time 1415    PT Time Calculation (min) 45 min    Activity Tolerance Patient tolerated treatment well    Behavior During Therapy [facility] for tasks assessed/performed             Past Medical History:  Diagnosis Date   Arthritis    Complication of anesthesia    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 Tiffany Kocher   History of 2019 novel coronavirus disease (COVID-19) 01/15/2021   Hypertension    Low back pain 04/1994   Menopause    Osteoarthritis    Restless leg syndrome 1999   Past Surgical History:  Procedure Laterality Date   ACHILLES TENDON SURGERY Right 11/16/2021   Procedure: PTAL - ACHILLES TENDON MICROTENOTOMY;  Surgeon: Caroline More, DPM;  Location: [facility];  Service: Podiatry;  Laterality: Right;   ARTHRODESIS METATARSAL Right 11/16/2021   Procedure: ARTHRODESIS METATARSAL, T-N, C-C OR MET-CUNNEIFORM, NCJ;  Surgeon: Caroline More, DPM;  Location: [facility];  Service: Podiatry;  Laterality: Right;   CATARACT EXTRACTION     CATARACT EXTRACTION W/PHACO Left 08/15/2020   Procedure: CATARACT EXTRACTION PHACO AND INTRAOCULAR LENS PLACEMENT (IOC) LEFT 6.60 00:39.8;  Surgeon: Birder Robson, MD;  Location: [place];  Service: Ophthalmology;  Laterality: Left;   CATARACT EXTRACTION W/PHACO Right 08/29/2020   Procedure: CATARACT EXTRACTION PHACO AND INTRAOCULAR LENS PLACEMENT (IOC) RIGHT 6.94 00:42.3;  Surgeon: Birder Robson, MD;  Location: [place];  Service:  Ophthalmology;  Laterality: Right;   [place]   X2   COLONOSCOPY  12/06/2011   normal   COLONOSCOPY WITH PROPOFOL N/A 04/13/2018   Procedure: COLONOSCOPY WITH PROPOFOL;  Surgeon: Manya Silvas, MD;  Location: [facility];  Service: Endoscopy;  Laterality: N/A;   ESOPHAGOGASTRODUODENOSCOPY (EGD) WITH PROPOFOL N/A 02/23/2015   Procedure: ESOPHAGOGASTRODUODENOSCOPY (EGD) WITH PROPOFOL;  Surgeon: Manya Silvas, MD;  Location: [facility]COPY;  Service: Endoscopy;  Laterality: N/A;   ESOPHAGOGASTRODUODENOSCOPY (EGD) WITH PROPOFOL N/A 04/13/2018   Procedure: ESOPHAGOGASTRODUODENOSCOPY (EGD) WITH PROPOFOL;  Surgeon: Manya Silvas, MD;  Location: [facility] ENDOSCOPY;  Service: Endoscopy;  Laterality: N/A;   FOOT ARTHRODESIS Right 11/16/2021   Procedure: ARTHRODESIS FOOT; TRIPLE;  Surgeon: Caroline More, DPM;  Location: [facility];  Service: Podiatry;  Laterality: Right;   FOOT SURGERY     with screws   HARDWARE REMOVAL Right 11/16/2021   Procedure: HARDWARE REMOVAL;  Surgeon: Caroline More, DPM;  Location: [facility];  Service: Podiatry;  Laterality: Right;   HIP CLOSED REDUCTION Left 08/08/2015   Procedure: CLOSED REDUCTION HIP;  Surgeon: Dereck Leep, MD;  Location: [facility];  Service: Orthopedics;  Laterality: Left;   HIP CLOSED REDUCTION Left 09/18/2015   Procedure: CLOSED REDUCTION HIP;  Surgeon: Hessie Knows, MD;  Location: [facility];  Service: Orthopedics;  Laterality: Left;   HIP CLOSED REDUCTION Left 12/01/2015  Procedure: CLOSED REDUCTION HIP;  Surgeon: Dereck Leep, MD;  Location: [facility];  Service: Orthopedics;  Laterality: Left;  no prep no incision   JOINT REPLACEMENT  10/2016   left knee   KNEE ARTHROPLASTY Left 10/28/2016   Procedure: COMPUTER ASSISTED TOTAL KNEE ARTHROPLASTY;  Surgeon: Dereck Leep, MD;  Location: [facility];  Service: Orthopedics;  Laterality: Left;   KNEE ARTHROPLASTY Right 07/07/2017   Procedure: COMPUTER ASSISTED TOTAL KNEE  ARTHROPLASTY;  Surgeon: Dereck Leep, MD;  Location: [facility];  Service: Orthopedics;  Laterality: Right;   KNEE ARTHROSCOPY Right 11/14/2014   Procedure: ARTHROSCOPY KNEE;  Surgeon: Dereck Leep, MD;  Location: [facility];  Service: Orthopedics;  Laterality: Right;  Posterior horn menicus, anterior lateral menicus grade 3 chondromalacia   phlebitis groin     right foot surgery     SHOULDER ARTHROSCOPY WITH OPEN ROTATOR CUFF REPAIR Left 03/10/2019   Procedure: LEFT SHOULDER ARTHROSCOPY, ARTHROSCOPIC ROTATOR CUFF REPAIR, DISTAL CLAVICLE EXCISION, SUBACROMIAL DECOMPRESSION, INTRA-ARTICULAR DEBRIDEMENT ;  Surgeon: Lovell Sheehan, MD;  Location: [facility];  Service: Orthopedics;  Laterality: Left;   SHOULDER ARTHROSCOPY WITH ROTATOR CUFF REPAIR Left 06/21/2019   Procedure: SHOULDER ARTHROSCOPY WITH EXTENSIVE INTRAARTICULAR DEBRIDEMENT WITH REMOVAL OF HARDWARE;  Surgeon: Lovell Sheehan, MD;  Location: [place];  Service: Orthopedics;  Laterality: Left;   TONSILLECTOMY     TOTAL HIP ARTHROPLASTY Left 05/08/2011   necrosis of hip   TOTAL HIP ARTHROPLASTY Right 06/14/2015   Procedure: TOTAL HIP ARTHROPLASTY;  Surgeon: Dereck Leep, MD;  Location: [facility];  Service: Orthopedics;  Laterality: Right;   TOTAL HIP REVISION Left 02/21/2016   Procedure: TOTAL HIP REVISION;  Surgeon: Dereck Leep, MD;  Location: [facility];  Service: Orthopedics;  Laterality: Left;   Patient Active Problem List   Diagnosis Date Noted   Pain, postoperative, acute 11/16/2021   Generalized osteoarthritis of multiple sites 07/28/2018   Varicose veins of both legs with edema 07/28/2018   FH: colon polyps 02/10/2018   S/P total knee arthroplasty 07/07/2017   Status post total left knee replacement 10/28/2016   Failed total hip arthroplasty with dislocation ([place]) 12/01/2015   S/P closed reduction of dislocated total hip prosthesis 08/08/2015   S/P total hip arthroplasty 06/14/2015   Benign essential HTN  12/28/2013   Acid reflux 12/28/2013   HLD (hyperlipidemia) 12/28/2013   Restless leg syndrome 12/28/2013   Depression 1999    PCP: Not listed   REFERRING PROVIDER: Dr. Caroline More   REFERRING DIAG: S/p R achiless tendon lengthening and right foot triple arthrodesis naviculocuneiform jont fusion  THERAPY DIAG:  Stiffness of right foot, not elsewhere classified  Other abnormalities of gait and mobility  Rationale for Evaluation and Treatment Rehabilitation  ONSET DATE: 11/16/2021   SUBJECTIVE:   SUBJECTIVE STATEMENT: See pertinent history   PERTINENT HISTORY: Pt reports s/p 12 weeks after undergoing right Achilles tendon lengthening, triple arthrodesis, naviculocuneiform jont fusion. She has some post surgical issues with screws breaking and a wound infection. However, despite the issues foot is healing well as evidenced by most recent imaging. She has a long history of ostearthritis that runs in her family. 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   PAIN:  Are you having pain? No  PRECAUTIONS: None  WEIGHT BEARING RESTRICTIONS Yes WBAT   FALLS:  Has patient fallen in last 6 months? No  LIVING ENVIRONMENT: Lives with: lives with their spouse Lives  in: House/apartment Stairs: No, Ramped entrance  Has following equipment at home:  Rollator   OCCUPATION: Retired   PLOF: Needs assistance with ADLs  PATIENT GOALS To strength her legs, because she has not been able to weight bear for past few months. She would also like to walk and not sit down every few minutes.   OBJECTIVE:          VITALS: BP 128/70 HR 76 SpO2 100  DIAGNOSTIC FINDINGS: CT scan 01/30/22: CLINICAL DATA: Achilles tendon lengthening. Triple arthrodesis. Ten week follow-up evaluation.  EXAM: CT OF THE RIGHT FOOT WITHOUT CONTRAST  CT OF THE RIGHT ANKLE WITHOUT CONTRAST  TECHNIQUE: Multidetector CT imaging of the right foot was performed according to the standard protocol.  Multiplanar CT image reconstructions were also generated.  Multidetector CT imaging of the right ankle was performed according to the standard protocol. Multiplanar CT image reconstructions were also generated.  RADIATION DOSE REDUCTION: This exam was performed according to the departmental dose-optimization program which includes automated exposure control, adjustment of the mA and/or kV according to patient size and/or use of iterative reconstruction technique.  COMPARISON: None Available.  FINDINGS: Bones/Joint/Cartilage  No acute fracture or dislocation. Subtalar arthrodesis transfixed with 2 cannulated screws with developing bone bridging across the posterior subtalar joint. Talonavicular arthrodesis transfixed with dorsal medial sideplate and multiple interlocking screws with severe joint space narrowing and a small area possible bone bridging along the medial aspect. Arthrodesis of the navicular-medial cuneiform with osseous bridging across the joint space. Calcaneocuboid arthrodesis with a small area of bone bridging along the mid aspect. No hardware failure or complication.  Osseous fusion of the second TMT joint. Moderate osteoarthritis of the navicular-middle cuneiform. Normal alignment. Small ankle joint effusion. Small plantar calcaneal spur.  Ligaments  Ligaments are suboptimally evaluated by CT.  Muscles and Tendons Muscles are normal. No muscle atrophy. No intramuscular fluid collection or hematoma. Flexor and extensor compartment tendons are intact. Peroneal tendons are intact. Mild thickening of the Achilles tendon likely reflecting postsurgical changes. Punctate calcifications in the medial band of the plantar fascia likely reflecting sequela prior plantar fasciitis.  Soft tissue No fluid collection or hematoma. No soft tissue mass. Soft tissue swelling along the dorsal aspect of the foot.  IMPRESSION: 1. Subtalar arthrodesis transfixed with 2  cannulated screws with developing bone bridging across the posterior subtalar joint. 2. Talonavicular arthrodesis transfixed with dorsal medial sideplate and multiple interlocking screws with severe joint space narrowing and a small area possible bone bridging along the medial aspect. 3. Arthrodesis of the navicular-medial cuneiform with osseous bridging across the joint space. Calcaneocuboid arthrodesis with a small area of bone bridging along the mid aspect. 4. Osseous fusion of the second TMT joint. 5. Moderate osteoarthritis of the navicular-middle cuneiform.   Electronically Signed By: Kathreen Devoid M.D. On: 02/01/2022 07:17  X-rays: Right foot 3 views AP, lateral, lateral oblique: Status post triple arthrodesis with naviculocuneiform joint fusion. Medial column plate appears to be intact with screws the appropriate size and length. Screws appear the area to be the appropriate size and length at the subtalar joint level and staples appear to be providing compression across the calcaneocuboid joint. Unfortunately patient does appear to have some hardware broken, screw is broken at the talonavicular joint and one of the staples is broken at the calcaneocuboid joint. Foot appears to be in better alignment compared to preop but appears to have lost some correction compared to initial postop imaging as there appears to be reduction  in calcaneal inclination angle and talar declination angle appears to be increased, forefoot abduction angle also appears to be increased compared to initial postoperative imaging. Joint still appear to be in stable position and improved compared to preop clinically. Noticed more bony consolidation across the navicular cuneiform joint, talonavicular joint, calcaneocuboid joint, and subtalar joints.  PATIENT SURVEYS:  FOTO 47/100 with target of 60  COGNITION:  Overall cognitive status: Within functional limits for tasks assessed     SENSATION: WFL  EDEMA: Deferred,  lipoma on lateral malleolus of right foot.   MUSCLE LENGTH: Hamstrings: Right 70 deg; Left 70 deg with restriction  Marcello Moores test: Right NT deg; Left NT  deg  POSTURE: rounded shoulders and forward head  PALPATION: Lpoma on lateral side of right foot along right malleolus   LOWER EXTREMITY ROM:    Active  Right 02/20/2022 Left 02/20/2022  Hip flexion 120 120  Hip extension 30 30  Hip abduction 45 45  Hip adduction 30 30  Hip internal rotation 45 45  Hip external rotation 45 45  Knee flexion 135 135  Knee extension 0 0  Ankle dorsiflexion 20 20  Ankle plantarflexion 15/20 20/20  Ankle inversion 20/25 35/35  Ankle eversion 10/15 15/15   (Blank rows = not tested)     LOWER EXTREMITY MMT:  MMT Right eval Left eval  Hip flexion    Hip extension    Hip abduction    Hip adduction    Hip internal rotation    Hip external rotation    Knee flexion 5 5  Knee extension 5 5  Ankle dorsiflexion 5 5  Ankle plantarflexion 2 (Long Sitting) NT  Ankle inversion 5 5  Ankle eversion 5 5   (Blank rows = not tested)   FUNCTIONAL TESTS:  6 minute walk test: NT   GAIT: Distance walked: 50 ft  Assistive device utilized: Environmental consultant - 4 wheeled Level of assistance: Modified independence Comments: Pt donning surgical boot so gait assessment deferred     TODAY'S TREATMENT: Standing Heel Raises with BUE support 3 x 10    PATIENT EDUCATION:  Education details: form and technique for appropriate exercise  Person educated: Patient Education method: Explanation and Demonstration Education comprehension: verbalized understanding, returned demonstration, and verbal cues required   HOME EXERCISE PROGRAM: Access Code: [email or web address] URL: [email or web address] Date: 02/20/2022 Prepared by: Bradly Chris  Exercises - Heel Raises with Counter Support  - 1 x daily - 3 x weekly - 3 sets - 15 reps  ASSESSMENT:  CLINICAL IMPRESSION: Patient is a 76 y.o. white female who  was seen today for physical therapy evaluation and treatment for s/p 12 weeks for a right ankle arthrodesis and r achilles tendon lengthening. She exhibits decreased right ankle strength and ROM and decreased mobility. She will continue to benefit from skilled PT to improve mobility and stability to ambulate without an assistive device for prolonged distances to decrease caregiver burden to regain her quality of life.      OBJECTIVE IMPAIRMENTS Abnormal gait, decreased balance, decreased endurance, decreased mobility, difficulty walking, decreased ROM, decreased strength, hypomobility, and impaired flexibility.   ACTIVITY LIMITATIONS standing, squatting, stairs, and locomotion level  PARTICIPATION LIMITATIONS: cleaning, shopping, and community activity  PERSONAL FACTORS Age, Fitness, and 1 comorbidity: family history of arthritis  are also affecting patient's functional outcome.   REHAB POTENTIAL: Good  CLINICAL DECISION MAKING: Stable/uncomplicated  EVALUATION COMPLEXITY: Low   GOALS: Goals reviewed with patient? No  SHORT TERM GOALS: Target date: 03/07/2022  Pt will be independent with HEP in order to improve strength and balance in order to decrease fall risk and improve function at home and work. Baseline: NT  Goal status: INITIAL  2.  Patient will ambulate without donning surgical boot as evidenced of tissue healing and improvement in ankle stability.  Baseline: Wearing surgical boot  Goal status: INITIAL  3.  Pt will increase 6MWT by at least 32m(1680f in order to demonstrate clinically significant improvement in cardiopulmonary endurance and community ambulation Baseline: NT  Goal status: INITIAL    LONG TERM GOALS: Target date: 05/02/2022   Patient will have improved function and activity level as evidenced by an increase in FOTO score by 10 points or more.  Baseline: 47/100 with target of 60 Goal status: INITIAL  2.  Patient will improve RLE strength to be  symmetrical with LLE for improved gait mechanics and weight bearing without use of AD.  Baseline: PF R/L 2, NT for hip strength  Goal status: INITIAL  3.  Patient will negotiate a flight of stairs as evidence of improved mobility and improved surgical healing  Baseline: Not performed  Goal status: INITIAL  4.  Patient will ambulate 1,000 ft during 31m29mas evidence that her left foot is healing and she is capable of ambulating community level distances.  Baseline: NT  Goal status: INITIAL    PLAN: PT FREQUENCY: 1-2x/week  PT DURATION: 10 weeks  PLANNED INTERVENTIONS: Therapeutic exercises, Neuromuscular re-education, Balance training, Gait training, Patient/Family education, Self Care, Joint mobilization, Joint manipulation, Stair training, DME instructions, Aquatic Therapy, Electrical stimulation, Cryotherapy, Moist heat, scar mobilization, Manual therapy, and Re-evaluation  PLAN FOR NEXT SESSION: 31mW7mbegin left foot strengthening in weight bearing position    DaniBradly Chris DPT  02/21/2022, 11:28 AM

## 2022-02-25 ENCOUNTER — Encounter: Payer: Self-pay | Admitting: Physical Therapy

## 2022-02-25 ENCOUNTER — Ambulatory Visit: Payer: PPO | Admitting: Physical Therapy

## 2022-02-25 DIAGNOSIS — R2689 Other abnormalities of gait and mobility: Secondary | ICD-10-CM

## 2022-02-25 DIAGNOSIS — M25674 Stiffness of right foot, not elsewhere classified: Secondary | ICD-10-CM

## 2022-02-25 NOTE — Therapy (Signed)
OUTPATIENT PHYSICAL THERAPY TREATMENT NOTE   Patient Name: Claudia Terry MRN: [id number] DOB:1945/09/19, 76 y.o., female Today's Date: 02/25/2022  PCP: Dr. Shirline Frees  REFERRING PROVIDER: Dr. Shirline Frees   END OF SESSION:   PT End of Session - 02/25/22 1337     Visit Number 2    Number of Visits 20    Date for PT Re-Evaluation 05/01/22    Authorization Type [id number]    Authorization - Visit Number 2    Authorization - Number of Visits 20    Progress Note Due on Visit 10    PT Start Time 1330    PT Stop Time 1415    PT Time Calculation (min) 45 min    Activity Tolerance Patient tolerated treatment well    Behavior During Therapy [facility] for tasks assessed/performed             Past Medical History:  Diagnosis Date   Arthritis    Complication of anesthesia    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 Tiffany Kocher   History of 2019 novel coronavirus disease (COVID-19) 01/15/2021   Hypertension    Low back pain 04/1994   Menopause    Osteoarthritis    Restless leg syndrome 1999   Past Surgical History:  Procedure Laterality Date   ACHILLES TENDON SURGERY Right 11/16/2021   Procedure: PTAL - ACHILLES TENDON MICROTENOTOMY;  Surgeon: Caroline More, DPM;  Location: [facility];  Service: Podiatry;  Laterality: Right;   ARTHRODESIS METATARSAL Right 11/16/2021   Procedure: ARTHRODESIS METATARSAL, T-N, C-C OR MET-CUNNEIFORM, NCJ;  Surgeon: Caroline More, DPM;  Location: [facility];  Service: Podiatry;  Laterality: Right;   CATARACT EXTRACTION     CATARACT EXTRACTION W/PHACO Left 08/15/2020   Procedure: CATARACT EXTRACTION PHACO AND INTRAOCULAR LENS PLACEMENT (IOC) LEFT 6.60 00:39.8;  Surgeon: Birder Robson, MD;  Location: [place];  Service: Ophthalmology;  Laterality: Left;   CATARACT EXTRACTION W/PHACO Right 08/29/2020   Procedure: CATARACT EXTRACTION PHACO AND INTRAOCULAR LENS PLACEMENT (IOC) RIGHT 6.94 00:42.3;   Surgeon: Birder Robson, MD;  Location: [place];  Service: Ophthalmology;  Laterality: Right;   [place]   X2   COLONOSCOPY  12/06/2011   normal   COLONOSCOPY WITH PROPOFOL N/A 04/13/2018   Procedure: COLONOSCOPY WITH PROPOFOL;  Surgeon: Manya Silvas, MD;  Location: [facility] ENDOSCOPY;  Service: Endoscopy;  Laterality: N/A;   ESOPHAGOGASTRODUODENOSCOPY (EGD) WITH PROPOFOL N/A 02/23/2015   Procedure: ESOPHAGOGASTRODUODENOSCOPY (EGD) WITH PROPOFOL;  Surgeon: Manya Silvas, MD;  Location: [facility] ENDOSCOPY;  Service: Endoscopy;  Laterality: N/A;   ESOPHAGOGASTRODUODENOSCOPY (EGD) WITH PROPOFOL N/A 04/13/2018   Procedure: ESOPHAGOGASTRODUODENOSCOPY (EGD) WITH PROPOFOL;  Surgeon: Manya Silvas, MD;  Location: [facility] ENDOSCOPY;  Service: Endoscopy;  Laterality: N/A;   FOOT ARTHRODESIS Right 11/16/2021   Procedure: ARTHRODESIS FOOT; TRIPLE;  Surgeon: Caroline More, DPM;  Location: [facility];  Service: Podiatry;  Laterality: Right;   FOOT SURGERY     with screws   HARDWARE REMOVAL Right 11/16/2021   Procedure: HARDWARE REMOVAL;  Surgeon: Caroline More, DPM;  Location: [facility];  Service: Podiatry;  Laterality: Right;   HIP CLOSED REDUCTION Left 08/08/2015   Procedure: CLOSED REDUCTION HIP;  Surgeon: Dereck Leep, MD;  Location: [facility];  Service: Orthopedics;  Laterality: Left;   HIP CLOSED REDUCTION Left 09/18/2015   Procedure: CLOSED REDUCTION HIP;  Surgeon: Hessie Knows, MD;  Location: [facility]  ORS;  Service: Orthopedics;  Laterality: Left;   HIP CLOSED REDUCTION Left 12/01/2015   Procedure: CLOSED REDUCTION HIP;  Surgeon: Dereck Leep, MD;  Location: [facility];  Service: Orthopedics;  Laterality: Left;  no prep no incision   JOINT REPLACEMENT  10/2016   left knee   KNEE ARTHROPLASTY Left 10/28/2016   Procedure: COMPUTER ASSISTED TOTAL KNEE ARTHROPLASTY;  Surgeon: Dereck Leep, MD;  Location: [facility];  Service: Orthopedics;  Laterality: Left;   KNEE  ARTHROPLASTY Right 07/07/2017   Procedure: COMPUTER ASSISTED TOTAL KNEE ARTHROPLASTY;  Surgeon: Dereck Leep, MD;  Location: [facility];  Service: Orthopedics;  Laterality: Right;   KNEE ARTHROSCOPY Right 11/14/2014   Procedure: ARTHROSCOPY KNEE;  Surgeon: Dereck Leep, MD;  Location: [facility];  Service: Orthopedics;  Laterality: Right;  Posterior horn menicus, anterior lateral menicus grade 3 chondromalacia   phlebitis groin     right foot surgery     SHOULDER ARTHROSCOPY WITH OPEN ROTATOR CUFF REPAIR Left 03/10/2019   Procedure: LEFT SHOULDER ARTHROSCOPY, ARTHROSCOPIC ROTATOR CUFF REPAIR, DISTAL CLAVICLE EXCISION, SUBACROMIAL DECOMPRESSION, INTRA-ARTICULAR DEBRIDEMENT ;  Surgeon: Lovell Sheehan, MD;  Location: [facility];  Service: Orthopedics;  Laterality: Left;   SHOULDER ARTHROSCOPY WITH ROTATOR CUFF REPAIR Left 06/21/2019   Procedure: SHOULDER ARTHROSCOPY WITH EXTENSIVE INTRAARTICULAR DEBRIDEMENT WITH REMOVAL OF HARDWARE;  Surgeon: Lovell Sheehan, MD;  Location: [place];  Service: Orthopedics;  Laterality: Left;   TONSILLECTOMY     TOTAL HIP ARTHROPLASTY Left 05/08/2011   necrosis of hip   TOTAL HIP ARTHROPLASTY Right 06/14/2015   Procedure: TOTAL HIP ARTHROPLASTY;  Surgeon: Dereck Leep, MD;  Location: [facility];  Service: Orthopedics;  Laterality: Right;   TOTAL HIP REVISION Left 02/21/2016   Procedure: TOTAL HIP REVISION;  Surgeon: Dereck Leep, MD;  Location: [facility];  Service: Orthopedics;  Laterality: Left;   Patient Active Problem List   Diagnosis Date Noted   Pain, postoperative, acute 11/16/2021   Generalized osteoarthritis of multiple sites 07/28/2018   Varicose veins of both legs with edema 07/28/2018   FH: colon polyps 02/10/2018   S/P total knee arthroplasty 07/07/2017   Status post total left knee replacement 10/28/2016   Failed total hip arthroplasty with dislocation ([place]) 12/01/2015   S/P closed reduction of dislocated total hip prosthesis  08/08/2015   S/P total hip arthroplasty 06/14/2015   Benign essential HTN 12/28/2013   Acid reflux 12/28/2013   HLD (hyperlipidemia) 12/28/2013   Restless leg syndrome 12/28/2013   Depression 1999    REFERRING DIAG:  S/p R achiless tendon lengthening and right ankle arthrodesis.     THERAPY DIAG:  Stiffness of right foot, not elsewhere classified  Other abnormalities of gait and mobility  Rationale for Evaluation and Treatment Rehabilitation  PERTINENT HISTORY: Pt reports s/p 12 weeks after undergoing right Achilles tendon lengthening, triple arthrodesis, naviculocuneiform jont fusion. She has some post surgical issues with screws breaking and a wound infection. However, despite the issues foot is healing well as evidenced by most recent imaging. She has a long history of ostearthritis that runs in her family. 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   PRECAUTIONS: WBAT  SUBJECTIVE: Pt reports wearing shoe on RLE for first time since before surgery.   PAIN:  Are you having pain? No   OBJECTIVE: (objective measures completed at initial evaluation unless otherwise dated)   VITALS: BP 128/70 HR 76 SpO2 100   DIAGNOSTIC  FINDINGS: CT scan 01/30/22: CLINICAL DATA: Achilles tendon lengthening. Triple arthrodesis. Ten week follow-up evaluation.  EXAM: CT OF THE RIGHT FOOT WITHOUT CONTRAST  CT OF THE RIGHT ANKLE WITHOUT CONTRAST  TECHNIQUE: Multidetector CT imaging of the right foot was performed according to the standard protocol. Multiplanar CT image reconstructions were also generated.  Multidetector CT imaging of the right ankle was performed according to the standard protocol. Multiplanar CT image reconstructions were also generated.  RADIATION DOSE REDUCTION: This exam was performed according to the departmental dose-optimization program which includes automated exposure control, adjustment of the mA and/or kV according to patient size and/or  use of iterative reconstruction technique.  COMPARISON: None Available.  FINDINGS: Bones/Joint/Cartilage  No acute fracture or dislocation. Subtalar arthrodesis transfixed with 2 cannulated screws with developing bone bridging across the posterior subtalar joint. Talonavicular arthrodesis transfixed with dorsal medial sideplate and multiple interlocking screws with severe joint space narrowing and a small area possible bone bridging along the medial aspect. Arthrodesis of the navicular-medial cuneiform with osseous bridging across the joint space. Calcaneocuboid arthrodesis with a small area of bone bridging along the mid aspect. No hardware failure or complication.  Osseous fusion of the second TMT joint. Moderate osteoarthritis of the navicular-middle cuneiform. Normal alignment. Small ankle joint effusion. Small plantar calcaneal spur.  Ligaments  Ligaments are suboptimally evaluated by CT.  Muscles and Tendons Muscles are normal. No muscle atrophy. No intramuscular fluid collection or hematoma. Flexor and extensor compartment tendons are intact. Peroneal tendons are intact. Mild thickening of the Achilles tendon likely reflecting postsurgical changes. Punctate calcifications in the medial band of the plantar fascia likely reflecting sequela prior plantar fasciitis.  Soft tissue No fluid collection or hematoma. No soft tissue mass. Soft tissue swelling along the dorsal aspect of the foot.  IMPRESSION: 1. Subtalar arthrodesis transfixed with 2 cannulated screws with developing bone bridging across the posterior subtalar joint. 2. Talonavicular arthrodesis transfixed with dorsal medial sideplate and multiple interlocking screws with severe joint space narrowing and a small area possible bone bridging along the medial aspect. 3. Arthrodesis of the navicular-medial cuneiform with osseous bridging across the joint space. Calcaneocuboid arthrodesis with a small area of bone  bridging along the mid aspect. 4. Osseous fusion of the second TMT joint. 5. Moderate osteoarthritis of the navicular-middle cuneiform.   Electronically Signed By: Kathreen Devoid M.D. On: 02/01/2022 07:17   X-rays: Right foot 3 views AP, lateral, lateral oblique: Status post triple arthrodesis with naviculocuneiform joint fusion. Medial column plate appears to be intact with screws the appropriate size and length. Screws appear the area to be the appropriate size and length at the subtalar joint level and staples appear to be providing compression across the calcaneocuboid joint. Unfortunately patient does appear to have some hardware broken, screw is broken at the talonavicular joint and one of the staples is broken at the calcaneocuboid joint. Foot appears to be in better alignment compared to preop but appears to have lost some correction compared to initial postop imaging as there appears to be reduction in calcaneal inclination angle and talar declination angle appears to be increased, forefoot abduction angle also appears to be increased compared to initial postoperative imaging. Joint still appear to be in stable position and improved compared to preop clinically. Noticed more bony consolidation across the navicular cuneiform joint, talonavicular joint, calcaneocuboid joint, and subtalar joints.   PATIENT SURVEYS:  FOTO 47/100 with target of 60   COGNITION:  Overall cognitive status: Within functional limits for tasks assessed                          SENSATION: WFL   EDEMA: Deferred, lipoma on lateral malleolus of right foot.    MUSCLE LENGTH: Hamstrings: Right 70 deg; Left 70 deg with restriction  Marcello Moores test: Right NT deg; Left NT  deg   POSTURE: rounded shoulders and forward head   PALPATION: Lpoma on lateral side of right foot along right malleolus    LOWER EXTREMITY ROM:       Active  Right 02/20/2022 Left 02/20/2022  Hip flexion 120 120  Hip extension 30 30   Hip abduction 45 45  Hip adduction 30 30  Hip internal rotation 45 45  Hip external rotation 45 45  Knee flexion 135 135  Knee extension 0 0  Ankle dorsiflexion 20 20  Ankle plantarflexion 15/20 20/20  Ankle inversion 20/25 35/35  Ankle eversion 10/15 15/15   (Blank rows = not tested)        LOWER EXTREMITY MMT:   MMT Right eval Left eval Right  Left  Hip flexion        Hip extension        Hip abduction     3+ 3+  Hip adduction     4- 4-  Hip internal rotation        Hip external rotation        Knee flexion 5 5    Knee extension 5 5    Ankle dorsiflexion 5 5    Ankle plantarflexion 2 (Long Sitting) NT  2  Ankle inversion 5 5    Ankle eversion 5 5     (Blank rows = not tested)     FUNCTIONAL TESTS:  6 minute walk test: 900 ft with use of rollator    GAIT: Distance walked: 50 ft  Assistive device utilized: Environmental consultant - 4 wheeled Level of assistance: Modified independence Comments: Pt donning surgical boot so gait assessment deferred        TODAY'S TREATMENT:  02/25/22:            6 mWT: 900 ft with rollator             See MMT             Ely's Test: + Bilateral             Standing Hip Abduction with BUE support 3 x 10             Standing Heel Raises with BUE support 3 x 10               Initial: Standing Heel Raises with BUE support 3 x 10      PATIENT EDUCATION:  Education details: form and technique for appropriate exercise  Person educated: Patient Education method: Explanation and Demonstration Education comprehension: verbalized understanding, returned demonstration, and verbal cues required     HOME EXERCISE PROGRAM: Access Code: [email or web address] URL: [email or web address] Date: 02/25/2022 Prepared by: [place] with Counter Support  - 1 x daily - 3 x weekly - 3 sets - 15 reps - Standing Hip Abduction with Counter Support  - 1 x daily - 3 x weekly - 3 sets - 10 reps - Prone Quadriceps Stretch with  Strap  - 1 x daily - 7 x weekly - 3 reps - 30-60 hold  ASSESSMENT:   CLINICAL IMPRESSION: Patient is a 76 y.o. white female who was seen today for physical therapy evaluation and treatment for s/p 13 weeks for a right ankle arthrodesis and r achilles tendon lengthening . Pt not limited by pain while ambulating and she presents close to community level ambulator. She was able to perform all exercises without being limited by pain. She will continue to benefit from skilled PT to improve mobility and stability to ambulate without an assistive device for prolonged distances to decrease caregiver burden to regain her quality of life.      OBJECTIVE IMPAIRMENTS Abnormal gait, decreased balance, decreased endurance, decreased mobility, difficulty walking, decreased ROM, decreased strength, hypomobility, and impaired flexibility.    ACTIVITY LIMITATIONS standing, squatting, stairs, and locomotion level   PARTICIPATION LIMITATIONS: cleaning, shopping, and community activity   PERSONAL FACTORS Age, Fitness, and 1 comorbidity: family history of arthritis  are also affecting patient's functional outcome.    REHAB POTENTIAL: Good   CLINICAL DECISION MAKING: Stable/uncomplicated   EVALUATION COMPLEXITY: Low     GOALS: Goals reviewed with patient? No   SHORT TERM GOALS: Target date: 03/07/2022  Pt will be independent with HEP in order to improve strength and balance in order to decrease fall risk and improve function at home and work. Baseline: NT  Goal status: INITIAL   2.  Patient will ambulate without donning surgical boot as evidenced of tissue healing and improvement in ankle stability.  Baseline: Wearing surgical boot  Goal status: Achieved    3.  Pt will increase 6MWT by at least 230m(161f in order to demonstrate clinically significant improvement in cardiopulmonary endurance and community ambulation Baseline: 900 ft  Goal status: Ongoing        LONG TERM GOALS: Target date:  05/02/2022    Patient will have improved function and activity level as evidenced by an increase in FOTO score by 10 points or more.  Baseline: 47/100 with target of 60 Goal status: Ongoing    2.  Patient will improve RLE strength to be symmetrical with LLE for improved gait mechanics and weight bearing without use of AD.  Baseline: PF R/L 2, NT for hip strength  Goal status: Ongoing    3.  Patient will negotiate a flight of stairs as evidence of improved mobility and improved surgical healing  Baseline: Not performed  Goal status: Ongoing    4.  Patient will ambulate 1,000 ft during 30m71mas evidence that her left foot is healing and she is capable of ambulating community level distances.  Baseline: 900 ft  Goal status: Achieved        PLAN: PT FREQUENCY: 1-2x/week   PT DURATION: 10 weeks   PLANNED INTERVENTIONS: Therapeutic exercises, Neuromuscular re-education, Balance training, Gait training, Patient/Family education, Self Care, Joint mobilization, Joint manipulation, Stair training, DME instructions, Aquatic Therapy, Electrical stimulation, Cryotherapy, Moist heat, scar mobilization, Manual therapy, and Re-evaluation   PLAN FOR NEXT SESSION: [place] exercises for LE strengthening and balance exercises      DanBradly Chris, DPT  02/25/2022, 1:39 PM

## 2022-02-28 ENCOUNTER — Ambulatory Visit: Payer: PPO

## 2022-02-28 DIAGNOSIS — M25674 Stiffness of right foot, not elsewhere classified: Secondary | ICD-10-CM

## 2022-02-28 DIAGNOSIS — R2689 Other abnormalities of gait and mobility: Secondary | ICD-10-CM

## 2022-02-28 NOTE — Therapy (Signed)
OUTPATIENT PHYSICAL THERAPY TREATMENT NOTE   Patient Name: Claudia Terry MRN: [id number] DOB:21-Jan-1946, 76 y.o., female Today's Date: 02/28/2022  PCP: Dr. Shirline Frees  REFERRING PROVIDER: Dr. Shirline Frees   END OF SESSION:   PT End of Session - 02/28/22 1022     Visit Number 3    Number of Visits 20    Date for PT Re-Evaluation 05/01/22    Authorization Type HTA [id number]    Authorization - Visit Number 3    Authorization - Number of Visits 20    Progress Note Due on Visit 10    PT Start Time 1018    PT Stop Time 1102    PT Time Calculation (min) 44 min    Activity Tolerance Patient tolerated treatment well    Behavior During Therapy [facility] for tasks assessed/performed             Past Medical History:  Diagnosis Date   Arthritis    Complication of anesthesia    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 Tiffany Kocher   History of 2019 novel coronavirus disease (COVID-19) 01/15/2021   Hypertension    Low back pain 04/1994   Menopause    Osteoarthritis    Restless leg syndrome 1999   Past Surgical History:  Procedure Laterality Date   ACHILLES TENDON SURGERY Right 11/16/2021   Procedure: PTAL - ACHILLES TENDON MICROTENOTOMY;  Surgeon: Caroline More, DPM;  Location: [facility];  Service: Podiatry;  Laterality: Right;   ARTHRODESIS METATARSAL Right 11/16/2021   Procedure: ARTHRODESIS METATARSAL, T-N, C-C OR MET-CUNNEIFORM, NCJ;  Surgeon: Caroline More, DPM;  Location: [facility];  Service: Podiatry;  Laterality: Right;   CATARACT EXTRACTION     CATARACT EXTRACTION W/PHACO Left 08/15/2020   Procedure: CATARACT EXTRACTION PHACO AND INTRAOCULAR LENS PLACEMENT (IOC) LEFT 6.60 00:39.8;  Surgeon: Birder Robson, MD;  Location: [place];  Service: Ophthalmology;  Laterality: Left;   CATARACT EXTRACTION W/PHACO Right 08/29/2020   Procedure: CATARACT EXTRACTION PHACO AND INTRAOCULAR LENS PLACEMENT (IOC) RIGHT 6.94 00:42.3;   Surgeon: Birder Robson, MD;  Location: [place];  Service: Ophthalmology;  Laterality: Right;   [place]   X2   COLONOSCOPY  12/06/2011   normal   COLONOSCOPY WITH PROPOFOL N/A 04/13/2018   Procedure: COLONOSCOPY WITH PROPOFOL;  Surgeon: Manya Silvas, MD;  Location: [facility];  Service: Endoscopy;  Laterality: N/A;   ESOPHAGOGASTRODUODENOSCOPY (EGD) WITH PROPOFOL N/A 02/23/2015   Procedure: ESOPHAGOGASTRODUODENOSCOPY (EGD) WITH PROPOFOL;  Surgeon: Manya Silvas, MD;  Location: [facility]OSCOPY;  Service: Endoscopy;  Laterality: N/A;   ESOPHAGOGASTRODUODENOSCOPY (EGD) WITH PROPOFOL N/A 04/13/2018   Procedure: ESOPHAGOGASTRODUODENOSCOPY (EGD) WITH PROPOFOL;  Surgeon: Manya Silvas, MD;  Location: [facility] END[facility]COPY;  Service: Endoscopy;  Laterality: N/A;   FOOT ARTHRODESIS Right 11/16/2021   Procedure: ARTHRODESIS FOOT; TRIPLE;  Surgeon: Caroline More, DPM;  Location: [facility];  Service: Podiatry;  Laterality: Right;   FOOT SURGERY     with screws   HARDWARE REMOVAL Right 11/16/2021   Procedure: HARDWARE REMOVAL;  Surgeon: Caroline More, DPM;  Location: [facility];  Service: Podiatry;  Laterality: Right;   HIP CLOSED REDUCTION Left 08/08/2015   Procedure: CLOSED REDUCTION HIP;  Surgeon: Dereck Leep, MD;  Location: [facility];  Service: Orthopedics;  Laterality: Left;   HIP CLOSED REDUCTION Left 09/18/2015   Procedure: CLOSED REDUCTION HIP;  Surgeon: Hessie Knows, MD;  Location: [facility]  ORS;  Service: Orthopedics;  Laterality: Left;   HIP CLOSED REDUCTION Left 12/01/2015   Procedure: CLOSED REDUCTION HIP;  Surgeon: Dereck Leep, MD;  Location: [facility];  Service: Orthopedics;  Laterality: Left;  no prep no incision   JOINT REPLACEMENT  10/2016   left knee   KNEE ARTHROPLASTY Left 10/28/2016   Procedure: COMPUTER ASSISTED TOTAL KNEE ARTHROPLASTY;  Surgeon: Dereck Leep, MD;  Location: [facility];  Service: Orthopedics;  Laterality: Left;   KNEE  ARTHROPLASTY Right 07/07/2017   Procedure: COMPUTER ASSISTED TOTAL KNEE ARTHROPLASTY;  Surgeon: Dereck Leep, MD;  Location: [facility];  Service: Orthopedics;  Laterality: Right;   KNEE ARTHROSCOPY Right 11/14/2014   Procedure: ARTHROSCOPY KNEE;  Surgeon: Dereck Leep, MD;  Location: [facility];  Service: Orthopedics;  Laterality: Right;  Posterior horn menicus, anterior lateral menicus grade 3 chondromalacia   phlebitis groin     right foot surgery     SHOULDER ARTHROSCOPY WITH OPEN ROTATOR CUFF REPAIR Left 03/10/2019   Procedure: LEFT SHOULDER ARTHROSCOPY, ARTHROSCOPIC ROTATOR CUFF REPAIR, DISTAL CLAVICLE EXCISION, SUBACROMIAL DECOMPRESSION, INTRA-ARTICULAR DEBRIDEMENT ;  Surgeon: Lovell Sheehan, MD;  Location: [facility];  Service: Orthopedics;  Laterality: Left;   SHOULDER ARTHROSCOPY WITH ROTATOR CUFF REPAIR Left 06/21/2019   Procedure: SHOULDER ARTHROSCOPY WITH EXTENSIVE INTRAARTICULAR DEBRIDEMENT WITH REMOVAL OF HARDWARE;  Surgeon: Lovell Sheehan, MD;  Location: [place];  Service: Orthopedics;  Laterality: Left;   TONSILLECTOMY     TOTAL HIP ARTHROPLASTY Left 05/08/2011   necrosis of hip   TOTAL HIP ARTHROPLASTY Right 06/14/2015   Procedure: TOTAL HIP ARTHROPLASTY;  Surgeon: Dereck Leep, MD;  Location: [facility];  Service: Orthopedics;  Laterality: Right;   TOTAL HIP REVISION Left 02/21/2016   Procedure: TOTAL HIP REVISION;  Surgeon: Dereck Leep, MD;  Location: [facility];  Service: Orthopedics;  Laterality: Left;   Patient Active Problem List   Diagnosis Date Noted   Pain, postoperative, acute 11/16/2021   Generalized osteoarthritis of multiple sites 07/28/2018   Varicose veins of both legs with edema 07/28/2018   FH: colon polyps 02/10/2018   S/P total knee arthroplasty 07/07/2017   Status post total left knee replacement 10/28/2016   Failed total hip arthroplasty with dislocation ([place]) 12/01/2015   S/P closed reduction of dislocated total hip prosthesis  08/08/2015   S/P total hip arthroplasty 06/14/2015   Benign essential HTN 12/28/2013   Acid reflux 12/28/2013   HLD (hyperlipidemia) 12/28/2013   Restless leg syndrome 12/28/2013   Depression 1999    REFERRING DIAG:  S/p R achiless tendon lengthening and right ankle arthrodesis.     THERAPY DIAG:  Stiffness of right foot, not elsewhere classified  Other abnormalities of gait and mobility  Rationale for Evaluation and Treatment Rehabilitation  PERTINENT HISTORY: Pt reports s/p 12 weeks after undergoing right Achilles tendon lengthening, triple arthrodesis, naviculocuneiform jont fusion. She has some post surgical issues with screws breaking and a wound infection. However, despite the issues foot is healing well as evidenced by most recent imaging. She has a long history of ostearthritis that runs in her family. 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   PRECAUTIONS: WBAT  SUBJECTIVE: Pt denies pain, some quad soreness from HEP. Still ambulating without boot and using rollator. Has been walking in household without device.  PAIN:  Are you having pain? No   OBJECTIVE: (objective measures completed at initial evaluation unless otherwise dated)   VITALS:  BP 128/70 HR 76 SpO2 100   DIAGNOSTIC FINDINGS: CT scan 01/30/22: CLINICAL DATA: Achilles tendon lengthening. Triple arthrodesis. Ten week follow-up evaluation.  EXAM: CT OF THE RIGHT FOOT WITHOUT CONTRAST  CT OF THE RIGHT ANKLE WITHOUT CONTRAST  TECHNIQUE: Multidetector CT imaging of the right foot was performed according to the standard protocol. Multiplanar CT image reconstructions were also generated.  Multidetector CT imaging of the right ankle was performed according to the standard protocol. Multiplanar CT image reconstructions were also generated.  RADIATION DOSE REDUCTION: This exam was performed according to the departmental dose-optimization program which includes automated exposure  control, adjustment of the mA and/or kV according to patient size and/or use of iterative reconstruction technique.  COMPARISON: None Available.  FINDINGS: Bones/Joint/Cartilage  No acute fracture or dislocation. Subtalar arthrodesis transfixed with 2 cannulated screws with developing bone bridging across the posterior subtalar joint. Talonavicular arthrodesis transfixed with dorsal medial sideplate and multiple interlocking screws with severe joint space narrowing and a small area possible bone bridging along the medial aspect. Arthrodesis of the navicular-medial cuneiform with osseous bridging across the joint space. Calcaneocuboid arthrodesis with a small area of bone bridging along the mid aspect. No hardware failure or complication.  Osseous fusion of the second TMT joint. Moderate osteoarthritis of the navicular-middle cuneiform. Normal alignment. Small ankle joint effusion. Small plantar calcaneal spur.  Ligaments  Ligaments are suboptimally evaluated by CT.  Muscles and Tendons Muscles are normal. No muscle atrophy. No intramuscular fluid collection or hematoma. Flexor and extensor compartment tendons are intact. Peroneal tendons are intact. Mild thickening of the Achilles tendon likely reflecting postsurgical changes. Punctate calcifications in the medial band of the plantar fascia likely reflecting sequela prior plantar fasciitis.  Soft tissue No fluid collection or hematoma. No soft tissue mass. Soft tissue swelling along the dorsal aspect of the foot.  IMPRESSION: 1. Subtalar arthrodesis transfixed with 2 cannulated screws with developing bone bridging across the posterior subtalar joint. 2. Talonavicular arthrodesis transfixed with dorsal medial sideplate and multiple interlocking screws with severe joint space narrowing and a small area possible bone bridging along the medial aspect. 3. Arthrodesis of the navicular-medial cuneiform with osseous bridging  across the joint space. Calcaneocuboid arthrodesis with a small area of bone bridging along the mid aspect. 4. Osseous fusion of the second TMT joint. 5. Moderate osteoarthritis of the navicular-middle cuneiform.   Electronically Signed By: Kathreen Devoid M.D. On: 02/01/2022 07:17   X-rays: Right foot 3 views AP, lateral, lateral oblique: Status post triple arthrodesis with naviculocuneiform joint fusion. Medial column plate appears to be intact with screws the appropriate size and length. Screws appear the area to be the appropriate size and length at the subtalar joint level and staples appear to be providing compression across the calcaneocuboid joint. Unfortunately patient does appear to have some hardware broken, screw is broken at the talonavicular joint and one of the staples is broken at the calcaneocuboid joint. Foot appears to be in better alignment compared to preop but appears to have lost some correction compared to initial postop imaging as there appears to be reduction in calcaneal inclination angle and talar declination angle appears to be increased, forefoot abduction angle also appears to be increased compared to initial postoperative imaging. Joint still appear to be in stable position and improved compared to preop clinically. Noticed more bony consolidation across the navicular cuneiform joint, talonavicular joint, calcaneocuboid joint, and subtalar joints.   PATIENT SURVEYS:  FOTO 47/100 with target of 60   COGNITION:  Overall cognitive status: Within functional limits for tasks assessed                          SENSATION: WFL   EDEMA: Deferred, lipoma on lateral malleolus of right foot.    MUSCLE LENGTH: Hamstrings: Right 70 deg; Left 70 deg with restriction  Marcello Moores test: Right NT deg; Left NT  deg   POSTURE: rounded shoulders and forward head   PALPATION: Lpoma on lateral side of right foot along right malleolus    LOWER EXTREMITY ROM:       Active   Right 02/20/2022 Left 02/20/2022  Hip flexion 120 120  Hip extension 30 30  Hip abduction 45 45  Hip adduction 30 30  Hip internal rotation 45 45  Hip external rotation 45 45  Knee flexion 135 135  Knee extension 0 0  Ankle dorsiflexion 20 20  Ankle plantarflexion 15/20 20/20  Ankle inversion 20/25 35/35  Ankle eversion 10/15 15/15   (Blank rows = not tested)        LOWER EXTREMITY MMT:   MMT Right eval Left eval Right  Left  Hip flexion        Hip extension        Hip abduction     3+ 3+  Hip adduction     4- 4-  Hip internal rotation        Hip external rotation        Knee flexion 5 5    Knee extension 5 5    Ankle dorsiflexion 5 5    Ankle plantarflexion 2 (Long Sitting) NT  2  Ankle inversion 5 5    Ankle eversion 5 5     (Blank rows = not tested)     FUNCTIONAL TESTS:  6 minute walk test: 900 ft with use of rollator    GAIT: Distance walked: 50 ft  Assistive device utilized: Walker - 4 wheeled Level of assistance: Modified independence Comments: Pt donning surgical boot so gait assessment deferred        TODAY'S TREATMENT:  02/28/22:    There.Ex:    Measured great toe extension AROM:   R: 30 degrees  L: 60 degrees  Seated foot slides into great toe extension: 2x20 with 2-3 sec holds  Seated ankle AROM :    R Gastroc stretch with belt: 3x30 sec holds    R Ankle ABC's: x2 capital letters    Ankle INV/EVER on rocker board: 2x20/direction  OMEGA hamstring curl: Unable to perform due to legs getting stuck on machine.  OMEGA knee extension: 3x6, 25#  SLS: 3x30 sec holds/LE with BUE support    PATIENT EDUCATION:  Education details: form and technique for appropriate exercise  Person educated: Patient Education method: Explanation and Demonstration Education comprehension: verbalized understanding, returned demonstration, and verbal cues required     HOME EXERCISE PROGRAM: Access Code: [email or web address] URL:  [email or web address] Date: 02/25/2022 Prepared by: Stutsman with Counter Support  - 1 x daily - 3 x weekly - 3 sets - 15 reps - Standing Hip Abduction with Counter Support  - 1 x daily - 3 x weekly - 3 sets - 10 reps - Prone Quadriceps Stretch with Strap  - 1 x daily - 7 x weekly - 3 reps - 30-60 hold   ASSESSMENT:   CLINICAL IMPRESSION: Pt presents with good motivation. Measurement of great toe extension with notable limitations on  surgical foot. Education provided on exercises to improve great toe extension for return to normalized gait mechanics. Continuing with gentle balance, ankle AROM, and knee/hip/ankle strengthening with success and no pain in R foot. Pt will continue to benefit from skilled PT services to address deficits and return to normalized gait without AD to return to PLOF.   OBJECTIVE IMPAIRMENTS Abnormal gait, decreased balance, decreased endurance, decreased mobility, difficulty walking, decreased ROM, decreased strength, hypomobility, and impaired flexibility.    ACTIVITY LIMITATIONS standing, squatting, stairs, and locomotion level   PARTICIPATION LIMITATIONS: cleaning, shopping, and community activity   PERSONAL FACTORS Age, Fitness, and 1 comorbidity: family history of arthritis  are also affecting patient's functional outcome.    REHAB POTENTIAL: Good   CLINICAL DECISION MAKING: Stable/uncomplicated   EVALUATION COMPLEXITY: Low     GOALS: Goals reviewed with patient? No   SHORT TERM GOALS: Target date: 03/07/2022  Pt will be independent with HEP in order to improve strength and balance in order to decrease fall risk and improve function at home and work. Baseline: NT  Goal status: INITIAL   2.  Patient will ambulate without donning surgical boot as evidenced of tissue healing and improvement in ankle stability.  Baseline: Wearing surgical boot  Goal status: Achieved    3.  Pt will increase 6MWT by at least  69m(1611f in order to demonstrate clinically significant improvement in cardiopulmonary endurance and community ambulation Baseline: 900 ft  Goal status: Ongoing        LONG TERM GOALS: Target date: 05/02/2022    Patient will have improved function and activity level as evidenced by an increase in FOTO score by 10 points or more.  Baseline: 47/100 with target of 60 Goal status: Ongoing    2.  Patient will improve RLE strength to be symmetrical with LLE for improved gait mechanics and weight bearing without use of AD.  Baseline: PF R/L 2, NT for hip strength  Goal status: Ongoing    3.  Patient will negotiate a flight of stairs as evidence of improved mobility and improved surgical healing  Baseline: Not performed  Goal status: Ongoing    4.  Patient will ambulate 1,000 ft during 36m24mas evidence that her left foot is healing and she is capable of ambulating community level distances.  Baseline: 900 ft  Goal status: Achieved        PLAN: PT FREQUENCY: 1-2x/week   PT DURATION: 10 weeks   PLANNED INTERVENTIONS: Therapeutic exercises, Neuromuscular re-education, Balance training, Gait training, Patient/Family education, Self Care, Joint mobilization, Joint manipulation, Stair training, DME instructions, Aquatic Therapy, Electrical stimulation, Cryotherapy, Moist heat, scar mobilization, Manual therapy, and Re-evaluation   PLAN FOR NEXT SESSION: [place] exercises for LE strengthening and balance exercises      MilSalem Casterairly IV, PT, DPT Physical Therapist- [facility]/21/2023, 11:08 AM

## 2022-03-03 NOTE — Therapy (Signed)
OUTPATIENT PHYSICAL THERAPY TREATMENT NOTE   Patient Name: Claudia Terry MRN: [id number] DOB:[birth year], 76 y.o., female Today's Date: 03/04/2022  PCP: Dr. Shirline Frees  REFERRING PROVIDER: Dr. Shirline Frees   END OF SESSION:   PT End of Session - 03/04/22 1019     Visit Number 4    Number of Visits 20    Date for PT Re-Evaluation 05/01/22    Authorization Type [id number]    Authorization - Visit Number 3    Authorization - Number of Visits 20    Progress Note Due on Visit 10    PT Start Time 1016    PT Stop Time 1100    PT Time Calculation (min) 44 min    Activity Tolerance Patient tolerated treatment well    Behavior During Therapy [facility] for tasks assessed/performed              Past Medical History:  Diagnosis Date   Arthritis    Complication of anesthesia    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 Tiffany Kocher   History of 2019 novel coronavirus disease (COVID-19) 01/15/2021   Hypertension    Low back pain 04/1994   Menopause    Osteoarthritis    Restless leg syndrome 1999   Past Surgical History:  Procedure Laterality Date   ACHILLES TENDON SURGERY Right 11/16/2021   Procedure: PTAL - ACHILLES TENDON MICROTENOTOMY;  Surgeon: Caroline More, DPM;  Location: [facility];  Service: Podiatry;  Laterality: Right;   ARTHRODESIS METATARSAL Right 11/16/2021   Procedure: ARTHRODESIS METATARSAL, T-N, C-C OR MET-CUNNEIFORM, NCJ;  Surgeon: Caroline More, DPM;  Location: [facility];  Service: Podiatry;  Laterality: Right;   CATARACT EXTRACTION     CATARACT EXTRACTION W/PHACO Left 08/15/2020   Procedure: CATARACT EXTRACTION PHACO AND INTRAOCULAR LENS PLACEMENT (IOC) LEFT 6.60 00:39.8;  Surgeon: Birder Robson, MD;  Location: [place];  Service: Ophthalmology;  Laterality: Left;   CATARACT EXTRACTION W/PHACO Right 08/29/2020   Procedure: CATARACT EXTRACTION PHACO AND INTRAOCULAR LENS PLACEMENT (IOC) RIGHT 6.94 00:42.3;   Surgeon: Birder Robson, MD;  Location: [place];  Service: Ophthalmology;  Laterality: Right;   [place]   X2   COLONOSCOPY  12/06/2011   normal   COLONOSCOPY WITH PROPOFOL N/A 04/13/2018   Procedure: COLONOSCOPY WITH PROPOFOL;  Surgeon: Manya Silvas, MD;  Location: [facility];  Service: Endoscopy;  Laterality: N/A;   ESOPHAGOGASTRODUODENOSCOPY (EGD) WITH PROPOFOL N/A 02/23/2015   Procedure: ESOPHAGOGASTRODUODENOSCOPY (EGD) WITH PROPOFOL;  Surgeon: Manya Silvas, MD;  Location: [facility];  Service: Endoscopy;  Laterality: N/A;   ESOPHAGOGASTRODUODENOSCOPY (EGD) WITH PROPOFOL N/A 04/13/2018   Procedure: ESOPHAGOGASTRODUODENOSCOPY (EGD) WITH PROPOFOL;  Surgeon: Manya Silvas, MD;  Location: [facility];  Service: Endoscopy;  Laterality: N/A;   FOOT ARTHRODESIS Right 11/16/2021   Procedure: ARTHRODESIS FOOT; TRIPLE;  Surgeon: Caroline More, DPM;  Location: [facility];  Service: Podiatry;  Laterality: Right;   FOOT SURGERY     with screws   HARDWARE REMOVAL Right 11/16/2021   Procedure: HARDWARE REMOVAL;  Surgeon: Caroline More, DPM;  Location: [facility];  Service: Podiatry;  Laterality: Right;   HIP CLOSED REDUCTION Left 08/08/2015   Procedure: CLOSED REDUCTION HIP;  Surgeon: Dereck Leep, MD;  Location: [facility];  Service: Orthopedics;  Laterality: Left;   HIP CLOSED REDUCTION Left 09/18/2015   Procedure: CLOSED REDUCTION HIP;  Surgeon: Hessie Knows, MD;  Location:  [facility];  Service: Orthopedics;  Laterality: Left;   HIP CLOSED REDUCTION Left 12/01/2015   Procedure: CLOSED REDUCTION HIP;  Surgeon: Dereck Leep, MD;  Location: [facility];  Service: Orthopedics;  Laterality: Left;  no prep no incision   JOINT REPLACEMENT  10/2016   left knee   KNEE ARTHROPLASTY Left 10/28/2016   Procedure: COMPUTER ASSISTED TOTAL KNEE ARTHROPLASTY;  Surgeon: Dereck Leep, MD;  Location: [facility];  Service: Orthopedics;  Laterality: Left;   KNEE  ARTHROPLASTY Right 07/07/2017   Procedure: COMPUTER ASSISTED TOTAL KNEE ARTHROPLASTY;  Surgeon: Dereck Leep, MD;  Location: [facility];  Service: Orthopedics;  Laterality: Right;   KNEE ARTHROSCOPY Right 11/14/2014   Procedure: ARTHROSCOPY KNEE;  Surgeon: Dereck Leep, MD;  Location: [facility];  Service: Orthopedics;  Laterality: Right;  Posterior horn menicus, anterior lateral menicus grade 3 chondromalacia   phlebitis groin     right foot surgery     SHOULDER ARTHROSCOPY WITH OPEN ROTATOR CUFF REPAIR Left 03/10/2019   Procedure: LEFT SHOULDER ARTHROSCOPY, ARTHROSCOPIC ROTATOR CUFF REPAIR, DISTAL CLAVICLE EXCISION, SUBACROMIAL DECOMPRESSION, INTRA-ARTICULAR DEBRIDEMENT ;  Surgeon: Lovell Sheehan, MD;  Location: [facility];  Service: Orthopedics;  Laterality: Left;   SHOULDER ARTHROSCOPY WITH ROTATOR CUFF REPAIR Left 06/21/2019   Procedure: SHOULDER ARTHROSCOPY WITH EXTENSIVE INTRAARTICULAR DEBRIDEMENT WITH REMOVAL OF HARDWARE;  Surgeon: Lovell Sheehan, MD;  Location: [place];  Service: Orthopedics;  Laterality: Left;   TONSILLECTOMY     TOTAL HIP ARTHROPLASTY Left 05/08/2011   necrosis of hip   TOTAL HIP ARTHROPLASTY Right 06/14/2015   Procedure: TOTAL HIP ARTHROPLASTY;  Surgeon: Dereck Leep, MD;  Location: [facility];  Service: Orthopedics;  Laterality: Right;   TOTAL HIP REVISION Left 02/21/2016   Procedure: TOTAL HIP REVISION;  Surgeon: Dereck Leep, MD;  Location: [facility];  Service: Orthopedics;  Laterality: Left;   Patient Active Problem List   Diagnosis Date Noted   Pain, postoperative, acute 11/16/2021   Generalized osteoarthritis of multiple sites 07/28/2018   Varicose veins of both legs with edema 07/28/2018   FH: colon polyps 02/10/2018   S/P total knee arthroplasty 07/07/2017   Status post total left knee replacement 10/28/2016   Failed total hip arthroplasty with dislocation ([place]) 12/01/2015   S/P closed reduction of dislocated total hip prosthesis  08/08/2015   S/P total hip arthroplasty 06/14/2015   Benign essential HTN 12/28/2013   Acid reflux 12/28/2013   HLD (hyperlipidemia) 12/28/2013   Restless leg syndrome 12/28/2013   Depression 1999    REFERRING DIAG:  S/p R achiless tendon lengthening and right ankle arthrodesis.     THERAPY DIAG:  Difficulty in walking, not elsewhere classified  Muscle weakness (generalized)  Stiffness of right foot, not elsewhere classified  Other abnormalities of gait and mobility  Pain in left foot  Pain in right foot  Stiffness of left foot, not elsewhere classified  Rationale for Evaluation and Treatment Rehabilitation  PERTINENT HISTORY: Pt reports s/p 12 weeks after undergoing right Achilles tendon lengthening, triple arthrodesis, naviculocuneiform jont fusion. She has some post surgical issues with screws breaking and a wound infection. However, despite the issues foot is healing well as evidenced by most recent imaging. She has a long history of ostearthritis that runs in her family. 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   PRECAUTIONS: WBAT  SUBJECTIVE:   Pt notes she is doing well with the rollator over the last few  weeks.  Pt notes no pain upon arrival to the clinic and has been consistent with her HEP.    PAIN:  Are you having pain? No   OBJECTIVE: (objective measures completed at initial evaluation unless otherwise dated)  VITALS: BP 128/70 HR 76 SpO2 100   DIAGNOSTIC FINDINGS: CT scan 01/30/22: CLINICAL DATA: Achilles tendon lengthening. Triple arthrodesis. Ten week follow-up evaluation.  EXAM: CT OF THE RIGHT FOOT WITHOUT CONTRAST  CT OF THE RIGHT ANKLE WITHOUT CONTRAST  TECHNIQUE: Multidetector CT imaging of the right foot was performed according to the standard protocol. Multiplanar CT image reconstructions were also generated.  Multidetector CT imaging of the right ankle was performed according to the standard protocol.  Multiplanar CT image reconstructions were also generated.  RADIATION DOSE REDUCTION: This exam was performed according to the departmental dose-optimization program which includes automated exposure control, adjustment of the mA and/or kV according to patient size and/or use of iterative reconstruction technique.  COMPARISON: None Available.  FINDINGS: Bones/Joint/Cartilage  No acute fracture or dislocation. Subtalar arthrodesis transfixed with 2 cannulated screws with developing bone bridging across the posterior subtalar joint. Talonavicular arthrodesis transfixed with dorsal medial sideplate and multiple interlocking screws with severe joint space narrowing and a small area possible bone bridging along the medial aspect. Arthrodesis of the navicular-medial cuneiform with osseous bridging across the joint space. Calcaneocuboid arthrodesis with a small area of bone bridging along the mid aspect. No hardware failure or complication.  Osseous fusion of the second TMT joint. Moderate osteoarthritis of the navicular-middle cuneiform. Normal alignment. Small ankle joint effusion. Small plantar calcaneal spur.  Ligaments  Ligaments are suboptimally evaluated by CT.  Muscles and Tendons Muscles are normal. No muscle atrophy. No intramuscular fluid collection or hematoma. Flexor and extensor compartment tendons are intact. Peroneal tendons are intact. Mild thickening of the Achilles tendon likely reflecting postsurgical changes. Punctate calcifications in the medial band of the plantar fascia likely reflecting sequela prior plantar fasciitis.  Soft tissue No fluid collection or hematoma. No soft tissue mass. Soft tissue swelling along the dorsal aspect of the foot.  IMPRESSION: 1. Subtalar arthrodesis transfixed with 2 cannulated screws with developing bone bridging across the posterior subtalar joint. 2. Talonavicular arthrodesis transfixed with dorsal medial sideplate and  multiple interlocking screws with severe joint space narrowing and a small area possible bone bridging along the medial aspect. 3. Arthrodesis of the navicular-medial cuneiform with osseous bridging across the joint space. Calcaneocuboid arthrodesis with a small area of bone bridging along the mid aspect. 4. Osseous fusion of the second TMT joint. 5. Moderate osteoarthritis of the navicular-middle cuneiform.   Electronically Signed By: Kathreen Devoid M.D. On: 02/01/2022 07:17   X-rays: Right foot 3 views AP, lateral, lateral oblique: Status post triple arthrodesis with naviculocuneiform joint fusion. Medial column plate appears to be intact with screws the appropriate size and length. Screws appear the area to be the appropriate size and length at the subtalar joint level and staples appear to be providing compression across the calcaneocuboid joint. Unfortunately patient does appear to have some hardware broken, screw is broken at the talonavicular joint and one of the staples is broken at the calcaneocuboid joint. Foot appears to be in better alignment compared to preop but appears to have lost some correction compared to initial postop imaging as there appears to be reduction in calcaneal inclination angle and talar declination angle appears to be increased, forefoot abduction angle also appears to be increased compared to initial postoperative imaging. Joint still  appear to be in stable position and improved compared to preop clinically. Noticed more bony consolidation across the navicular cuneiform joint, talonavicular joint, calcaneocuboid joint, and subtalar joints.   PATIENT SURVEYS:  FOTO 47/100 with target of 60   COGNITION:  Overall cognitive status: Within functional limits for tasks assessed                          SENSATION: WFL   EDEMA: Deferred, lipoma on lateral malleolus of right foot.    MUSCLE LENGTH: Hamstrings: Right 70 deg; Left 70 deg with restriction  Marcello Moores test:  Right NT deg; Left NT  deg   POSTURE: rounded shoulders and forward head   PALPATION: Lipoma on lateral side of right foot along right malleolus     LOWER EXTREMITY ROM:  Active  Right 02/20/2022 Left 02/20/2022  Hip flexion 120 120  Hip extension 30 30  Hip abduction 45 45  Hip adduction 30 30  Hip internal rotation 45 45  Hip external rotation 45 45  Knee flexion 135 135  Knee extension 0 0  Ankle dorsiflexion 20 20  Ankle plantarflexion 15/20 20/20  Ankle inversion 20/25 35/35  Ankle eversion 10/15 15/15   (Blank rows = not tested)        LOWER EXTREMITY MMT:   MMT Right eval Left eval Right  Left  Hip flexion        Hip extension        Hip abduction     3+ 3+  Hip adduction     4- 4-  Hip internal rotation        Hip external rotation        Knee flexion 5 5    Knee extension 5 5    Ankle dorsiflexion 5 5    Ankle plantarflexion 2 (Long Sitting) NT  2  Ankle inversion 5 5    Ankle eversion 5 5     (Blank rows = not tested)     FUNCTIONAL TESTS:  6 minute walk test: 900 ft with use of rollator    GAIT: Distance walked: 50 ft  Assistive device utilized: Walker - 4 wheeled Level of assistance: Modified independence Comments: Pt donning surgical boot so gait assessment deferred        TODAY'S TREATMENT:  03/04/22:  There.Ex:   Seated foot slides into great toe extension: 2x20 with 2-3 sec holds  Seated ankle AROM :  R Gastroc stretch with towel: 3x30 sec holds  R Ankle ABC's: x2 capital letters  Seated ankle IN/EVER on slick surface for increased ROM 2x15 each direction Seated arch lifts for improved intrinsic musculature control 2x15 Seated towel scrunches with use of intrinsic toe muscles, 2x15   Manual:  Seated metatarsal fan mobilization, 30 sec bouts for each metatarsal, Grades II-III for pain modulation and improved joint ROM PROM of ankle joint in all planes of mobility for increased ROM and pain modulation with  movement      PATIENT EDUCATION:  Education details: form and technique for appropriate exercise  Person educated: Patient Education method: Customer service manager Education comprehension: verbalized understanding, returned demonstration, and verbal cues required     HOME EXERCISE PROGRAM: Access Code: [email or web address] URL: [email or web address] Date: 02/25/2022 Prepared by: [place] with Counter Support  - 1 x daily - 3 x weekly - 3 sets - 15 reps - Standing Hip Abduction with Counter Support  - 1  x daily - 3 x weekly - 3 sets - 10 reps - Prone Quadriceps Stretch with Strap  - 1 x daily - 7 x weekly - 3 reps - 30-60 hold   ASSESSMENT:   CLINICAL IMPRESSION:  Pt responded well to overall exercises given as part of therapy session.  Pt does still note some tenderness in the metatarsal region with mobilization, but resolves with manual approach and pt able to tolerate increased grade for improved ROM.  Pt will continue to benefit from introduction of balance exercises in order to challenge proprioception of the musculature in the ankle as part of current POC.    OBJECTIVE IMPAIRMENTS Abnormal gait, decreased balance, decreased endurance, decreased mobility, difficulty walking, decreased ROM, decreased strength, hypomobility, and impaired flexibility.    ACTIVITY LIMITATIONS standing, squatting, stairs, and locomotion level   PARTICIPATION LIMITATIONS: cleaning, shopping, and community activity   PERSONAL FACTORS Age, Fitness, and 1 comorbidity: family history of arthritis  are also affecting patient's functional outcome.    REHAB POTENTIAL: Good   CLINICAL DECISION MAKING: Stable/uncomplicated   EVALUATION COMPLEXITY: Low     GOALS: Goals reviewed with patient? No   SHORT TERM GOALS: Target date: 03/07/2022  Pt will be independent with HEP in order to improve strength and balance in order to decrease fall risk and improve  function at home and work. Baseline: NT  Goal status: INITIAL   2.  Patient will ambulate without donning surgical boot as evidenced of tissue healing and improvement in ankle stability.  Baseline: Wearing surgical boot  Goal status: Achieved    3.  Pt will increase 6MWT by at least 65m(1661f in order to demonstrate clinically significant improvement in cardiopulmonary endurance and community ambulation Baseline: 900 ft  Goal status: Ongoing        LONG TERM GOALS: Target date: 05/02/2022    Patient will have improved function and activity level as evidenced by an increase in FOTO score by 10 points or more.  Baseline: 47/100 with target of 60 Goal status: Ongoing    2.  Patient will improve RLE strength to be symmetrical with LLE for improved gait mechanics and weight bearing without use of AD.  Baseline: PF R/L 2, NT for hip strength  Goal status: Ongoing    3.  Patient will negotiate a flight of stairs as evidence of improved mobility and improved surgical healing  Baseline: Not performed  Goal status: Ongoing    4.  Patient will ambulate 1,000 ft during 81m60mas evidence that her left foot is healing and she is capable of ambulating community level distances.  Baseline: 900 ft  Goal status: Achieved        PLAN:  PT FREQUENCY: 1-2x/week   PT DURATION: 10 weeks   PLANNED INTERVENTIONS: Therapeutic exercises, Neuromuscular re-education, Balance training, Gait training, Patient/Family education, Self Care, Joint mobilization, Joint manipulation, Stair training, DME instructions, Aquatic Therapy, Electrical stimulation, Cryotherapy, Moist heat, scar mobilization, Manual therapy, and Re-evaluation   PLAN FOR NEXT SESSION: [place] exercises for LE strengthening and balance exercises      JosGwenlyn SaranT, DPT Physical Therapist- [facility]/25/2023, 12:58 PM

## 2022-03-04 ENCOUNTER — Ambulatory Visit: Payer: PPO

## 2022-03-04 DIAGNOSIS — M25674 Stiffness of right foot, not elsewhere classified: Secondary | ICD-10-CM

## 2022-03-04 DIAGNOSIS — M79671 Pain in right foot: Secondary | ICD-10-CM

## 2022-03-04 DIAGNOSIS — M25675 Stiffness of left foot, not elsewhere classified: Secondary | ICD-10-CM

## 2022-03-04 DIAGNOSIS — R2689 Other abnormalities of gait and mobility: Secondary | ICD-10-CM

## 2022-03-04 DIAGNOSIS — M79672 Pain in left foot: Secondary | ICD-10-CM

## 2022-03-04 DIAGNOSIS — M6281 Muscle weakness (generalized): Secondary | ICD-10-CM

## 2022-03-04 DIAGNOSIS — R262 Difficulty in walking, not elsewhere classified: Secondary | ICD-10-CM

## 2022-03-06 ENCOUNTER — Ambulatory Visit: Payer: PPO | Admitting: Physical Therapy

## 2022-03-06 ENCOUNTER — Encounter: Payer: Self-pay | Admitting: Physical Therapy

## 2022-03-06 DIAGNOSIS — M810 Age-related osteoporosis without current pathological fracture: Secondary | ICD-10-CM | POA: Diagnosis not present

## 2022-03-06 DIAGNOSIS — M2141 Flat foot [pes planus] (acquired), right foot: Secondary | ICD-10-CM | POA: Diagnosis not present

## 2022-03-06 DIAGNOSIS — R262 Difficulty in walking, not elsewhere classified: Secondary | ICD-10-CM

## 2022-03-06 DIAGNOSIS — M79671 Pain in right foot: Secondary | ICD-10-CM

## 2022-03-06 DIAGNOSIS — M216X2 Other acquired deformities of left foot: Secondary | ICD-10-CM | POA: Diagnosis not present

## 2022-03-06 DIAGNOSIS — M2041 Other hammer toe(s) (acquired), right foot: Secondary | ICD-10-CM | POA: Diagnosis not present

## 2022-03-06 DIAGNOSIS — M2142 Flat foot [pes planus] (acquired), left foot: Secondary | ICD-10-CM | POA: Diagnosis not present

## 2022-03-06 DIAGNOSIS — M19071 Primary osteoarthritis, right ankle and foot: Secondary | ICD-10-CM | POA: Diagnosis not present

## 2022-03-06 DIAGNOSIS — M216X1 Other acquired deformities of right foot: Secondary | ICD-10-CM | POA: Diagnosis not present

## 2022-03-06 DIAGNOSIS — M76821 Posterior tibial tendinitis, right leg: Secondary | ICD-10-CM | POA: Diagnosis not present

## 2022-03-06 DIAGNOSIS — M2042 Other hammer toe(s) (acquired), left foot: Secondary | ICD-10-CM | POA: Diagnosis not present

## 2022-03-06 DIAGNOSIS — Z09 Encounter for follow-up examination after completed treatment for conditions other than malignant neoplasm: Secondary | ICD-10-CM | POA: Diagnosis not present

## 2022-03-06 DIAGNOSIS — M25674 Stiffness of right foot, not elsewhere classified: Secondary | ICD-10-CM | POA: Diagnosis not present

## 2022-03-06 NOTE — Therapy (Signed)
OUTPATIENT PHYSICAL THERAPY TREATMENT NOTE   Patient Name: Claudia Terry MRN: [id number] DOB:12/01/1945, 76 y.o., female Today's Date: 03/06/2022  PCP: Dr. Michael Baker  REFERRING PROVIDER: Dr. 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 Elliot   History of 2019 novel coronavirus disease (COVID-19) 01/15/2021   Hypertension    Low back pain 04/1994   Menopause    Osteoarthritis    Restless leg syndrome 1999   Past Surgical History:  Procedure Laterality Date   ACHILLES TENDON SURGERY Right 11/16/2021   Procedure: PTAL - ACHILLES TENDON MICROTENOTOMY;  Surgeon: Baker, Andrew, DPM;  Location: [facility];  Service: Podiatry;  Laterality: Right;   ARTHRODESIS METATARSAL Right 11/16/2021   Procedure: ARTHRODESIS METATARSAL, T-N, C-C OR MET-CUNNEIFORM, NCJ;  Surgeon: Baker, Andrew, DPM;  Location: [facility];  Service: Podiatry;  Laterality: Right;   CATARACT EXTRACTION     CATARACT EXTRACTION W/PHACO Left 08/15/2020   Procedure: CATARACT EXTRACTION PHACO AND INTRAOCULAR LENS PLACEMENT (IOC) LEFT 6.60 00:39.8;  Surgeon: Porfilio, William, MD;  Location: [facility];  Service: Ophthalmology;  Laterality: Left;   CATARACT EXTRACTION W/PHACO Right 08/29/2020   Procedure: CATARACT EXTRACTION PHACO AND INTRAOCULAR LENS PLACEMENT (IOC) RIGHT 6.94  00:42.3;  Surgeon: Porfilio, William, MD;  Location: [facility];  Service: Ophthalmology;  Laterality: Right;   CESAREAN SECTION  1972, 1975   X2   COLONOSCOPY  12/06/2011   normal   COLONOSCOPY WITH PROPOFOL N/A 04/13/2018   Procedure: COLONOSCOPY WITH PROPOFOL;  Surgeon: Elliott, Robert T, MD;  Location: [facility];  Service: Endoscopy;  Laterality: N/A;   ESOPHAGOGASTRODUODENOSCOPY (EGD) WITH PROPOFOL N/A 02/23/2015   Procedure: ESOPHAGOGASTRODUODENOSCOPY (EGD) WITH PROPOFOL;  Surgeon: Robert T Elliott, MD;  Location: [facility];  Service: Endoscopy;  Laterality: N/A;   ESOPHAGOGASTRODUODENOSCOPY (EGD) WITH PROPOFOL N/A 04/13/2018   Procedure: ESOPHAGOGASTRODUODENOSCOPY (EGD) WITH PROPOFOL;  Surgeon: Elliott, Robert T, MD;  Location: [facility];  Service: Endoscopy;  Laterality: N/A;   FOOT ARTHRODESIS Right 11/16/2021   Procedure: ARTHRODESIS FOOT; TRIPLE;  Surgeon: Baker, Andrew, DPM;  Location: [facility];  Service: Podiatry;  Laterality: Right;   FOOT SURGERY     with screws   HARDWARE REMOVAL Right 11/16/2021   Procedure: HARDWARE REMOVAL;  Surgeon: Baker, Andrew, DPM;  Location: [facility];  Service: Podiatry;  Laterality: Right;   HIP CLOSED REDUCTION Left 08/08/2015   Procedure: CLOSED REDUCTION HIP;  Surgeon: James P Hooten, MD;  Location: [facility];  Service: Orthopedics;  Laterality: Left;   HIP CLOSED REDUCTION Left 09/18/2015   Procedure: CLOSED REDUCTION HIP;  Surgeon: Michael Menz, MD;    Location: [facility];  Service: Orthopedics;  Laterality: Left;   HIP CLOSED REDUCTION Left 12/01/2015   Procedure: CLOSED REDUCTION HIP;  Surgeon: Dereck Leep, MD;  Location: [facility];  Service: Orthopedics;  Laterality: Left;  no prep no incision   JOINT REPLACEMENT  10/2016   left knee   KNEE ARTHROPLASTY Left 10/28/2016   Procedure: COMPUTER ASSISTED TOTAL KNEE ARTHROPLASTY;  Surgeon: Dereck Leep, MD;  Location: [facility];  Service: Orthopedics;  Laterality: Left;    KNEE ARTHROPLASTY Right 07/07/2017   Procedure: COMPUTER ASSISTED TOTAL KNEE ARTHROPLASTY;  Surgeon: Dereck Leep, MD;  Location: [facility];  Service: Orthopedics;  Laterality: Right;   KNEE ARTHROSCOPY Right 11/14/2014   Procedure: ARTHROSCOPY KNEE;  Surgeon: Dereck Leep, MD;  Location: [facility];  Service: Orthopedics;  Laterality: Right;  Posterior horn menicus, anterior lateral menicus grade 3 chondromalacia   phlebitis groin     right foot surgery     SHOULDER ARTHROSCOPY WITH OPEN ROTATOR CUFF REPAIR Left 03/10/2019   Procedure: LEFT SHOULDER ARTHROSCOPY, ARTHROSCOPIC ROTATOR CUFF REPAIR, DISTAL CLAVICLE EXCISION, SUBACROMIAL DECOMPRESSION, INTRA-ARTICULAR DEBRIDEMENT ;  Surgeon: Lovell Sheehan, MD;  Location: [facility];  Service: Orthopedics;  Laterality: Left;   SHOULDER ARTHROSCOPY WITH ROTATOR CUFF REPAIR Left 06/21/2019   Procedure: SHOULDER ARTHROSCOPY WITH EXTENSIVE INTRAARTICULAR DEBRIDEMENT WITH REMOVAL OF HARDWARE;  Surgeon: Lovell Sheehan, MD;  Location: [place];  Service: Orthopedics;  Laterality: Left;   TONSILLECTOMY     TOTAL HIP ARTHROPLASTY Left 05/08/2011   necrosis of hip   TOTAL HIP ARTHROPLASTY Right 06/14/2015   Procedure: TOTAL HIP ARTHROPLASTY;  Surgeon: Dereck Leep, MD;  Location: [facility];  Service: Orthopedics;  Laterality: Right;   TOTAL HIP REVISION Left 02/21/2016   Procedure: TOTAL HIP REVISION;  Surgeon: Dereck Leep, MD;  Location: [facility];  Service: Orthopedics;  Laterality: Left;   Patient Active Problem List   Diagnosis Date Noted   Pain, postoperative, acute 11/16/2021   Generalized osteoarthritis of multiple sites 07/28/2018   Varicose veins of both legs with edema 07/28/2018   FH: colon polyps 02/10/2018   S/P total knee arthroplasty 07/07/2017   Status post total left knee replacement 10/28/2016   Failed total hip arthroplasty with dislocation ([place]) 12/01/2015   S/P closed reduction of dislocated total hip prosthesis  08/08/2015   S/P total hip arthroplasty 06/14/2015   Benign essential HTN 12/28/2013   Acid reflux 12/28/2013   HLD (hyperlipidemia) 12/28/2013   Restless leg syndrome 12/28/2013   Depression 1999    REFERRING DIAG:  S/p R achiless tendon lengthening and right ankle arthrodesis.     THERAPY DIAG:  Pain in right foot  Difficulty in walking, not elsewhere classified  Rationale for Evaluation and Treatment Rehabilitation  PERTINENT HISTORY: Pt reports s/p 12 weeks after undergoing right Achilles tendon lengthening, triple arthrodesis, naviculocuneiform jont fusion. She has some post surgical issues with screws breaking and a wound infection. However, despite the issues foot is healing well as evidenced by most recent imaging. She has a long history of ostearthritis that runs in her family. 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   PRECAUTIONS: WBAT  SUBJECTIVE:   Pt reports increased soreness in foot from last session because of foot strengthening exercises.     PAIN:  Are you having pain? No   OBJECTIVE: (objective measures completed at initial evaluation unless otherwise dated)  VITALS: BP 128/70 HR 76 SpO2  100   DIAGNOSTIC FINDINGS: CT scan 01/30/22: CLINICAL DATA: Achilles tendon lengthening. Triple arthrodesis. Ten week follow-up evaluation.  EXAM: CT OF THE RIGHT FOOT WITHOUT CONTRAST  CT OF THE RIGHT ANKLE WITHOUT CONTRAST  TECHNIQUE: Multidetector CT imaging of the right foot was performed according to the standard protocol. Multiplanar CT image reconstructions were also generated.  Multidetector CT imaging of the right ankle was performed according to the standard protocol. Multiplanar CT image reconstructions were also generated.  RADIATION DOSE REDUCTION: This exam was performed according to the departmental dose-optimization program which includes automated exposure control, adjustment of the mA and/or kV according to patient  size and/or use of iterative reconstruction technique.  COMPARISON: None Available.  FINDINGS: Bones/Joint/Cartilage  No acute fracture or dislocation. Subtalar arthrodesis transfixed with 2 cannulated screws with developing bone bridging across the posterior subtalar joint. Talonavicular arthrodesis transfixed with dorsal medial sideplate and multiple interlocking screws with severe joint space narrowing and a small area possible bone bridging along the medial aspect. Arthrodesis of the navicular-medial cuneiform with osseous bridging across the joint space. Calcaneocuboid arthrodesis with a small area of bone bridging along the mid aspect. No hardware failure or complication.  Osseous fusion of the second TMT joint. Moderate osteoarthritis of the navicular-middle cuneiform. Normal alignment. Small ankle joint effusion. Small plantar calcaneal spur.  Ligaments  Ligaments are suboptimally evaluated by CT.  Muscles and Tendons Muscles are normal. No muscle atrophy. No intramuscular fluid collection or hematoma. Flexor and extensor compartment tendons are intact. Peroneal tendons are intact. Mild thickening of the Achilles tendon likely reflecting postsurgical changes. Punctate calcifications in the medial band of the plantar fascia likely reflecting sequela prior plantar fasciitis.  Soft tissue No fluid collection or hematoma. No soft tissue mass. Soft tissue swelling along the dorsal aspect of the foot.  IMPRESSION: 1. Subtalar arthrodesis transfixed with 2 cannulated screws with developing bone bridging across the posterior subtalar joint. 2. Talonavicular arthrodesis transfixed with dorsal medial sideplate and multiple interlocking screws with severe joint space narrowing and a small area possible bone bridging along the medial aspect. 3. Arthrodesis of the navicular-medial cuneiform with osseous bridging across the joint space. Calcaneocuboid arthrodesis with a small  area of bone bridging along the mid aspect. 4. Osseous fusion of the second TMT joint. 5. Moderate osteoarthritis of the navicular-middle cuneiform.   Electronically Signed By: Kathreen Devoid M.D. On: 02/01/2022 07:17   X-rays: Right foot 3 views AP, lateral, lateral oblique: Status post triple arthrodesis with naviculocuneiform joint fusion. Medial column plate appears to be intact with screws the appropriate size and length. Screws appear the area to be the appropriate size and length at the subtalar joint level and staples appear to be providing compression across the calcaneocuboid joint. Unfortunately patient does appear to have some hardware broken, screw is broken at the talonavicular joint and one of the staples is broken at the calcaneocuboid joint. Foot appears to be in better alignment compared to preop but appears to have lost some correction compared to initial postop imaging as there appears to be reduction in calcaneal inclination angle and talar declination angle appears to be increased, forefoot abduction angle also appears to be increased compared to initial postoperative imaging. Joint still appear to be in stable position and improved compared to preop clinically. Noticed more bony consolidation across the navicular cuneiform joint, talonavicular joint, calcaneocuboid joint, and subtalar joints.   PATIENT SURVEYS:  FOTO 47/100 with target of 60   COGNITION:  Overall cognitive status: Within  functional limits for tasks assessed                          SENSATION: WFL   EDEMA: Deferred, lipoma on lateral malleolus of right foot.    MUSCLE LENGTH: Hamstrings: Right 70 deg; Left 70 deg with restriction  Thomas test: Right NT deg; Left NT  deg   POSTURE: rounded shoulders and forward head   PALPATION: Lipoma on lateral side of right foot along right malleolus     LOWER EXTREMITY ROM:  Active  Right 02/20/2022 Left 02/20/2022  Hip flexion 120 120  Hip extension 30 30   Hip abduction 45 45  Hip adduction 30 30  Hip internal rotation 45 45  Hip external rotation 45 45  Knee flexion 135 135  Knee extension 0 0  Ankle dorsiflexion 20 20  Ankle plantarflexion 15/20 20/20  Ankle inversion 20/25 35/35  Ankle eversion 10/15 15/15   (Blank rows = not tested)        LOWER EXTREMITY MMT:   MMT Right eval Left eval Right  Left  Hip flexion        Hip extension        Hip abduction     3+ 3+  Hip adduction     4- 4-  Hip internal rotation        Hip external rotation        Knee flexion 5 5    Knee extension 5 5    Ankle dorsiflexion 5 5    Ankle plantarflexion 2 (Long Sitting) NT  2  Ankle inversion 5 5    Ankle eversion 5 5     (Blank rows = not tested)     FUNCTIONAL TESTS:  6 minute walk test: 900 ft with use of rollator    GAIT: Distance walked: 50 ft  Assistive device utilized: Walker - 4 wheeled Level of assistance: Modified independence Comments: Pt donning surgical boot so gait assessment deferred        TODAY'S TREATMENT:  03/06/22:  THEREX   Standing Heel Raises 1 x 10  Standing Heel Raises with BUE off 1 ft step 1 x 10  Standing Calf Stretch on LLE 3 x 30 sec   NMR  Semi-tandem R + L 5 x 10 sec  Semi-tandem R + L 3 x 10 head turns   Gait Training   10 m walk with use of SPC and swing through gait -min VC for increased step length on RLE    03/04/22:  There.Ex:   Seated foot slides into great toe extension: 2x20 with 2-3 sec holds  Seated ankle AROM :  R Gastroc stretch with towel: 3x30 sec holds  R Ankle ABC's: x2 capital letters  Seated ankle IN/EVER on slick surface for increased ROM 2x15 each direction Seated arch lifts for improved intrinsic musculature control 2x15 Seated towel scrunches with use of intrinsic toe muscles, 2x15   Manual:  Seated metatarsal fan mobilization, 30 sec bouts for each metatarsal, Grades II-III for pain modulation and improved joint ROM PROM of ankle joint in all planes  of mobility for increased ROM and pain modulation with movement      PATIENT EDUCATION:  Education details: form and technique for appropriate exercise  Person educated: Patient Education method: Explanation and Demonstration Education comprehension: verbalized understanding, returned demonstration, and verbal cues required     HOME EXERCISE PROGRAM:  Access Code: [email or web address] URL: [email or web address] Date:   03/06/2022 Prepared by: Daniel Fleming  Exercises - Standing Hip Abduction with Counter Support  - 1 x daily - 3 x weekly - 3 sets - 10 reps - Prone Quadriceps Stretch with Strap  - 1 x daily - 7 x weekly - 3 reps - 30-60 hold - Standing Bilateral Heel Raise on Step  - 1 x daily - 3 x weekly - 3 sets - 10 reps - Half Tandem Stance Balance with Head Rotation  - 1 x daily - 7 x weekly - 3 sets - 10 reps    ASSESSMENT:   CLINICAL IMPRESSION:  Pt exhibits improved weight bearing and dynamic balance with ability to ambulate with SPC instead of rollator. She also shows improved LE strength with ability to perform heel raises over an increased height.  She will continue to benefit from introduction of balance exercises in order to challenge proprioception of the musculature in the ankle as part of current POC.    OBJECTIVE IMPAIRMENTS Abnormal gait, decreased balance, decreased endurance, decreased mobility, difficulty walking, decreased ROM, decreased strength, hypomobility, and impaired flexibility.    ACTIVITY LIMITATIONS standing, squatting, stairs, and locomotion level   PARTICIPATION LIMITATIONS: cleaning, shopping, and community activity   PERSONAL FACTORS Age, Fitness, and 1 comorbidity: family history of arthritis  are also affecting patient's functional outcome.    REHAB POTENTIAL: Good   CLINICAL DECISION MAKING: Stable/uncomplicated   EVALUATION COMPLEXITY: Low     GOALS: Goals reviewed with patient? No   SHORT TERM GOALS: Target date: 03/07/2022   Pt will be independent with HEP in order to improve strength and balance in order to decrease fall risk and improve function at home and work. Baseline: NT  Goal status: INITIAL   2.  Patient will ambulate without donning surgical boot as evidenced of tissue healing and improvement in ankle stability.  Baseline: Wearing surgical boot  Goal status: Achieved    3.  Pt will increase 6MWT by at least 50m (164ft) in order to demonstrate clinically significant improvement in cardiopulmonary endurance and community ambulation Baseline: 900 ft  Goal status: Ongoing        LONG TERM GOALS: Target date: 05/02/2022    Patient will have improved function and activity level as evidenced by an increase in FOTO score by 10 points or more.  Baseline: 47/100 with target of 60 Goal status: Ongoing    2.  Patient will improve RLE strength to be symmetrical with LLE for improved gait mechanics and weight bearing without use of AD.  Baseline: PF R/L 2, NT for hip strength  Goal status: Ongoing    3.  Patient will negotiate a flight of stairs as evidence of improved mobility and improved surgical healing  Baseline: Not performed  Goal status: Ongoing    4.  Patient will ambulate 1,000 ft during 6mWT as evidence that her left foot is healing and she is capable of ambulating community level distances.  Baseline: 900 ft  Goal status: Achieved        PLAN:  PT FREQUENCY: 1-2x/week   PT DURATION: 10 weeks   PLANNED INTERVENTIONS: Therapeutic exercises, Neuromuscular re-education, Balance training, Gait training, Patient/Family education, Self Care, Joint mobilization, Joint manipulation, Stair training, DME instructions, Aquatic Therapy, Electrical stimulation, Cryotherapy, Moist heat, scar mobilization, Manual therapy, and Re-evaluation   PLAN FOR NEXT SESSION: [place] exercises for LE strengthening and balance exercises      Daniel Fleming PT, DPT  03/06/2022, 2:16 PM 

## 2022-03-11 ENCOUNTER — Ambulatory Visit: Payer: PPO | Attending: Podiatry | Admitting: Physical Therapy

## 2022-03-11 ENCOUNTER — Encounter: Payer: Self-pay | Admitting: Physical Therapy

## 2022-03-11 DIAGNOSIS — M6281 Muscle weakness (generalized): Secondary | ICD-10-CM | POA: Diagnosis not present

## 2022-03-11 DIAGNOSIS — R262 Difficulty in walking, not elsewhere classified: Secondary | ICD-10-CM | POA: Insufficient documentation

## 2022-03-11 DIAGNOSIS — M79671 Pain in right foot: Secondary | ICD-10-CM | POA: Insufficient documentation

## 2022-03-11 NOTE — Therapy (Signed)
OUTPATIENT PHYSICAL THERAPY TREATMENT NOTE   Patient Name: Claudia Terry MRN: [id number] DOB:07-03-1945, 76 y.o., female Today's Date: 03/11/2022  PCP: Dr. Shirline Frees  REFERRING PROVIDER: Dr. Shirline Frees   END OF SESSION:   PT End of Session - 03/11/22 1020     Visit Number 6    Number of Visits 20    Date for PT Re-Evaluation 05/01/22    Authorization Type HTA [id number]    Authorization - Visit Number 6    Authorization - Number of Visits 20    Progress Note Due on Visit 10    PT Start Time 1020    PT Stop Time 1100    PT Time Calculation (min) 40 min    Activity Tolerance Patient tolerated treatment well    Behavior During Therapy [facility] for tasks assessed/performed               Past Medical History:  Diagnosis Date   Arthritis    Complication of anesthesia    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 Tiffany Kocher   History of 2019 novel coronavirus disease (COVID-19) 01/15/2021   Hypertension    Low back pain 04/1994   Menopause    Osteoarthritis    Restless leg syndrome 1999   Past Surgical History:  Procedure Laterality Date   ACHILLES TENDON SURGERY Right 11/16/2021   Procedure: PTAL - ACHILLES TENDON MICROTENOTOMY;  Surgeon: Caroline More, DPM;  Location: [facility];  Service: Podiatry;  Laterality: Right;   ARTHRODESIS METATARSAL Right 11/16/2021   Procedure: ARTHRODESIS METATARSAL, T-N, C-C OR MET-CUNNEIFORM, NCJ;  Surgeon: Caroline More, DPM;  Location: [facility];  Service: Podiatry;  Laterality: Right;   CATARACT EXTRACTION     CATARACT EXTRACTION W/PHACO Left 08/15/2020   Procedure: CATARACT EXTRACTION PHACO AND INTRAOCULAR LENS PLACEMENT (IOC) LEFT 6.60 00:39.8;  Surgeon: Birder Robson, MD;  Location: [place];  Service: Ophthalmology;  Laterality: Left;   CATARACT EXTRACTION W/PHACO Right 08/29/2020   Procedure: CATARACT EXTRACTION PHACO AND INTRAOCULAR LENS PLACEMENT (IOC) RIGHT 6.94  00:42.3;  Surgeon: Birder Robson, MD;  Location: [place];  Service: Ophthalmology;  Laterality: Right;   [place]   X2   COLONOSCOPY  12/06/2011   normal   COLONOSCOPY WITH PROPOFOL N/A 04/13/2018   Procedure: COLONOSCOPY WITH PROPOFOL;  Surgeon: Manya Silvas, MD;  Location: [facility];  Service: Endoscopy;  Laterality: N/A;   ESOPHAGOGASTRODUODENOSCOPY (EGD) WITH PROPOFOL N/A 02/23/2015   Procedure: ESOPHAGOGASTRODUODENOSCOPY (EGD) WITH PROPOFOL;  Surgeon: Manya Silvas, MD;  Location: [facility];  Service: Endoscopy;  Laterality: N/A;   ESOPHAGOGASTRODUODENOSCOPY (EGD) WITH PROPOFOL N/A 04/13/2018   Procedure: ESOPHAGOGASTRODUODENOSCOPY (EGD) WITH PROPOFOL;  Surgeon: Manya Silvas, MD;  Location: [facility] ENDOSCOPY;  Service: Endoscopy;  Laterality: N/A;   FOOT ARTHRODESIS Right 11/16/2021   Procedure: ARTHRODESIS FOOT; TRIPLE;  Surgeon: Caroline More, DPM;  Location: [facility];  Service: Podiatry;  Laterality: Right;   FOOT SURGERY     with screws   HARDWARE REMOVAL Right 11/16/2021   Procedure: HARDWARE REMOVAL;  Surgeon: Caroline More, DPM;  Location: [facility];  Service: Podiatry;  Laterality: Right;   HIP CLOSED REDUCTION Left 08/08/2015   Procedure: CLOSED REDUCTION HIP;  Surgeon: Dereck Leep, MD;  Location: [facility];  Service: Orthopedics;  Laterality: Left;   HIP CLOSED REDUCTION Left 09/18/2015   Procedure: CLOSED REDUCTION HIP;  Surgeon: Hessie Knows, MD;  Location: [facility];  Service: Orthopedics;  Laterality: Left;   HIP CLOSED REDUCTION Left 12/01/2015   Procedure: CLOSED REDUCTION HIP;  Surgeon: Dereck Leep, MD;  Location: [facility];  Service: Orthopedics;  Laterality: Left;  no prep no incision   JOINT REPLACEMENT  10/2016   left knee   KNEE ARTHROPLASTY Left 10/28/2016   Procedure: COMPUTER ASSISTED TOTAL KNEE ARTHROPLASTY;  Surgeon: Dereck Leep, MD;  Location: [facility];  Service: Orthopedics;  Laterality: Left;    KNEE ARTHROPLASTY Right 07/07/2017   Procedure: COMPUTER ASSISTED TOTAL KNEE ARTHROPLASTY;  Surgeon: Dereck Leep, MD;  Location: [facility];  Service: Orthopedics;  Laterality: Right;   KNEE ARTHROSCOPY Right 11/14/2014   Procedure: ARTHROSCOPY KNEE;  Surgeon: Dereck Leep, MD;  Location: [facility];  Service: Orthopedics;  Laterality: Right;  Posterior horn menicus, anterior lateral menicus grade 3 chondromalacia   phlebitis groin     right foot surgery     SHOULDER ARTHROSCOPY WITH OPEN ROTATOR CUFF REPAIR Left 03/10/2019   Procedure: LEFT SHOULDER ARTHROSCOPY, ARTHROSCOPIC ROTATOR CUFF REPAIR, DISTAL CLAVICLE EXCISION, SUBACROMIAL DECOMPRESSION, INTRA-ARTICULAR DEBRIDEMENT ;  Surgeon: Lovell Sheehan, MD;  Location: [facility];  Service: Orthopedics;  Laterality: Left;   SHOULDER ARTHROSCOPY WITH ROTATOR CUFF REPAIR Left 06/21/2019   Procedure: SHOULDER ARTHROSCOPY WITH EXTENSIVE INTRAARTICULAR DEBRIDEMENT WITH REMOVAL OF HARDWARE;  Surgeon: Lovell Sheehan, MD;  Location: [place];  Service: Orthopedics;  Laterality: Left;   TONSILLECTOMY     TOTAL HIP ARTHROPLASTY Left 05/08/2011   necrosis of hip   TOTAL HIP ARTHROPLASTY Right 06/14/2015   Procedure: TOTAL HIP ARTHROPLASTY;  Surgeon: Dereck Leep, MD;  Location: [facility];  Service: Orthopedics;  Laterality: Right;   TOTAL HIP REVISION Left 02/21/2016   Procedure: TOTAL HIP REVISION;  Surgeon: Dereck Leep, MD;  Location: [facility];  Service: Orthopedics;  Laterality: Left;   Patient Active Problem List   Diagnosis Date Noted   Pain, postoperative, acute 11/16/2021   Generalized osteoarthritis of multiple sites 07/28/2018   Varicose veins of both legs with edema 07/28/2018   FH: colon polyps 02/10/2018   S/P total knee arthroplasty 07/07/2017   Status post total left knee replacement 10/28/2016   Failed total hip arthroplasty with dislocation ([place]) 12/01/2015   S/P closed reduction of dislocated total hip prosthesis  08/08/2015   S/P total hip arthroplasty 06/14/2015   Benign essential HTN 12/28/2013   Acid reflux 12/28/2013   HLD (hyperlipidemia) 12/28/2013   Restless leg syndrome 12/28/2013   Depression 1999    REFERRING DIAG:  S/p R achiless tendon lengthening and right ankle arthrodesis.     THERAPY DIAG:  Pain in right foot  Difficulty in walking, not elsewhere classified  Rationale for Evaluation and Treatment Rehabilitation  PERTINENT HISTORY: Pt reports s/p 12 weeks after undergoing right Achilles tendon lengthening, triple arthrodesis, naviculocuneiform jont fusion. She has some post surgical issues with screws breaking and a wound infection. However, despite the issues foot is healing well as evidenced by most recent imaging. She has a long history of ostearthritis that runs in her family. 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   PRECAUTIONS: WBAT  SUBJECTIVE:   Pt reports tripping and falling into her pantry over weekend. She sustained a couple of bruises but did not experience any broken bones. She has been continuing to use rollator for longer distances and cane for shorter distances.     PAIN:  Are you having pain? No   OBJECTIVE: (objective measures completed at initial evaluation unless otherwise dated)  VITALS: BP 128/70 HR 76 SpO2 100   DIAGNOSTIC FINDINGS: CT scan 01/30/22: CLINICAL DATA: Achilles tendon lengthening. Triple arthrodesis. Ten week follow-up evaluation.  EXAM: CT OF THE RIGHT FOOT WITHOUT CONTRAST  CT OF THE RIGHT ANKLE WITHOUT CONTRAST  TECHNIQUE: Multidetector CT imaging of the right foot was performed according to the standard protocol. Multiplanar CT image reconstructions were also generated.  Multidetector CT imaging of the right ankle was performed according to the standard protocol. Multiplanar CT image reconstructions were also generated.  RADIATION DOSE REDUCTION: This exam was performed according to  the departmental dose-optimization program which includes automated exposure control, adjustment of the mA and/or kV according to patient size and/or use of iterative reconstruction technique.  COMPARISON: None Available.  FINDINGS: Bones/Joint/Cartilage  No acute fracture or dislocation. Subtalar arthrodesis transfixed with 2 cannulated screws with developing bone bridging across the posterior subtalar joint. Talonavicular arthrodesis transfixed with dorsal medial sideplate and multiple interlocking screws with severe joint space narrowing and a small area possible bone bridging along the medial aspect. Arthrodesis of the navicular-medial cuneiform with osseous bridging across the joint space. Calcaneocuboid arthrodesis with a small area of bone bridging along the mid aspect. No hardware failure or complication.  Osseous fusion of the second TMT joint. Moderate osteoarthritis of the navicular-middle cuneiform. Normal alignment. Small ankle joint effusion. Small plantar calcaneal spur.  Ligaments  Ligaments are suboptimally evaluated by CT.  Muscles and Tendons Muscles are normal. No muscle atrophy. No intramuscular fluid collection or hematoma. Flexor and extensor compartment tendons are intact. Peroneal tendons are intact. Mild thickening of the Achilles tendon likely reflecting postsurgical changes. Punctate calcifications in the medial band of the plantar fascia likely reflecting sequela prior plantar fasciitis.  Soft tissue No fluid collection or hematoma. No soft tissue mass. Soft tissue swelling along the dorsal aspect of the foot.  IMPRESSION: 1. Subtalar arthrodesis transfixed with 2 cannulated screws with developing bone bridging across the posterior subtalar joint. 2. Talonavicular arthrodesis transfixed with dorsal medial sideplate and multiple interlocking screws with severe joint space narrowing and a small area possible bone bridging along the medial  aspect. 3. Arthrodesis of the navicular-medial cuneiform with osseous bridging across the joint space. Calcaneocuboid arthrodesis with a small area of bone bridging along the mid aspect. 4. Osseous fusion of the second TMT joint. 5. Moderate osteoarthritis of the navicular-middle cuneiform.   Electronically Signed By: Kathreen Devoid M.D. On: 02/01/2022 07:17   X-rays: Right foot 3 views AP, lateral, lateral oblique: Status post triple arthrodesis with naviculocuneiform joint fusion. Medial column plate appears to be intact with screws the appropriate size and length. Screws appear the area to be the appropriate size and length at the subtalar joint level and staples appear to be providing compression across the calcaneocuboid joint. Unfortunately patient does appear to have some hardware broken, screw is broken at the talonavicular joint and one of the staples is broken at the calcaneocuboid joint. Foot appears to be in better alignment compared to preop but appears to have lost some correction compared to initial postop imaging as there appears to be reduction in calcaneal inclination angle and talar declination angle appears to be increased, forefoot abduction angle also appears to be increased compared to initial postoperative imaging. Joint still appear to be in stable position and improved compared to preop clinically. Noticed more bony consolidation across the navicular cuneiform joint, talonavicular joint,  calcaneocuboid joint, and subtalar joints.   PATIENT SURVEYS:  FOTO 47/100 with target of 60   COGNITION:  Overall cognitive status: Within functional limits for tasks assessed                          SENSATION: WFL   EDEMA: Deferred, lipoma on lateral malleolus of right foot.    MUSCLE LENGTH: Hamstrings: Right 70 deg; Left 70 deg with restriction  Marcello Moores test: Right NT deg; Left NT  deg   POSTURE: rounded shoulders and forward head   PALPATION: Lipoma on lateral side of  right foot along right malleolus     LOWER EXTREMITY ROM:  Active  Right 02/20/2022 Left 02/20/2022  Hip flexion 120 120  Hip extension 30 30  Hip abduction 45 45  Hip adduction 30 30  Hip internal rotation 45 45  Hip external rotation 45 45  Knee flexion 135 135  Knee extension 0 0  Ankle dorsiflexion 20 20  Ankle plantarflexion 15/20 20/20  Ankle inversion 20/25 35/35  Ankle eversion 10/15 15/15   (Blank rows = not tested)        LOWER EXTREMITY MMT:   MMT Right eval Left eval Right  Left  Hip flexion        Hip extension        Hip abduction     3+ 3+  Hip adduction     4- 4-  Hip internal rotation        Hip external rotation        Knee flexion 5 5    Knee extension 5 5    Ankle dorsiflexion 5 5    Ankle plantarflexion 2 (Long Sitting) NT  2  Ankle inversion 5 5    Ankle eversion 5 5     (Blank rows = not tested)     FUNCTIONAL TESTS:  6 minute walk test: 900 ft with use of rollator    GAIT: Distance walked: 50 ft  Assistive device utilized: Environmental consultant - 4 wheeled Level of assistance: Modified independence Comments: Pt donning surgical boot so gait assessment deferred        TODAY'S TREATMENT:  03/11/22:  THEREX   Seated Soleus Heel Raises #10 1 x 10  Seated Soleus Heel Raises #15 1 x 10 Seated Soleus Heel Raises #20 1 x 10   NMR   Toe Taps onto 4 inch step 3 x 10    03/06/22:  THEREX   Standing Heel Raises 1 x 10  Standing Heel Raises with BUE off 1 ft step 1 x 10  Standing Calf Stretch on LLE 3 x 30 sec   NMR  Semi-tandem R + L 5 x 10 sec  Semi-tandem R + L 3 x 10 head turns   Gait Training   10 m walk with use of SPC and swing through gait -min VC for increased step length on RLE    03/04/22:  There.Ex:   Seated foot slides into great toe extension: 2x20 with 2-3 sec holds  Seated ankle AROM :  R Gastroc stretch with towel: 3x30 sec holds  R Ankle ABC's: x2 capital letters  Seated ankle IN/EVER on slick surface for  increased ROM 2x15 each direction Seated arch lifts for improved intrinsic musculature control 2x15 Seated towel scrunches with use of intrinsic toe muscles, 2x15   Manual:  Seated metatarsal fan mobilization, 30 sec bouts for each metatarsal, Grades II-III for pain  modulation and improved joint ROM PROM of ankle joint in all planes of mobility for increased ROM and pain modulation with movement      PATIENT EDUCATION:  Education details: form and technique for appropriate exercise  Person educated: Patient Education method: Customer service manager Education comprehension: verbalized understanding, returned demonstration, and verbal cues required     HOME EXERCISE PROGRAM:  Access Code: [email or web address] URL: [email or web address] Date: 03/11/2022 Prepared by: Bradly Chris  Exercises - Standing Hip Abduction with Counter Support  - 1 x daily - 3 x weekly - 3 sets - 10 reps - Prone Quadriceps Stretch with Strap  - 1 x daily - 7 x weekly - 3 reps - 30-60 hold - Standing Bilateral Heel Raise on Step  - 1 x daily - 3 x weekly - 3 sets - 10 reps - Half Tandem Stance Balance with Head Rotation  - 1 x daily - 7 x weekly - 3 sets - 10 reps - Standing Toe Taps  - 3 x weekly - 3 sets - 10 reps - Seated Heel Raise  - 3 x weekly - 3 sets - 10 reps    ASSESSMENT:   CLINICAL IMPRESSION:  Pt shows improvement with dynamic balance with ability to perform toe taps without UE support. Next session to focus on dynamic balance especially with walking to improve gait and balance to avoid falls and improve weight bearing through RLE.  She will continue to benefit from introduction of balance exercises in order to challenge proprioception of the musculature in the ankle as part of current POC.   OBJECTIVE IMPAIRMENTS Abnormal gait, decreased balance, decreased endurance, decreased mobility, difficulty walking, decreased ROM, decreased strength, hypomobility, and impaired flexibility.     ACTIVITY LIMITATIONS standing, squatting, stairs, and locomotion level   PARTICIPATION LIMITATIONS: cleaning, shopping, and community activity   PERSONAL FACTORS Age, Fitness, and 1 comorbidity: family history of arthritis  are also affecting patient's functional outcome.    REHAB POTENTIAL: Good   CLINICAL DECISION MAKING: Stable/uncomplicated   EVALUATION COMPLEXITY: Low     GOALS: Goals reviewed with patient? No   SHORT TERM GOALS: Target date: 03/07/2022  Pt will be independent with HEP in order to improve strength and balance in order to decrease fall risk and improve function at home and work. Baseline: NT  Goal status: INITIAL   2.  Patient will ambulate without donning surgical boot as evidenced of tissue healing and improvement in ankle stability.  Baseline: Wearing surgical boot  Goal status: Achieved    3.  Pt will increase 6MWT by at least 10m(1645f in order to demonstrate clinically significant improvement in cardiopulmonary endurance and community ambulation Baseline: 900 ft  Goal status: Ongoing        LONG TERM GOALS: Target date: 05/02/2022    Patient will have improved function and activity level as evidenced by an increase in FOTO score by 10 points or more.  Baseline: 47/100 with target of 60 Goal status: Ongoing    2.  Patient will improve RLE strength to be symmetrical with LLE for improved gait mechanics and weight bearing without use of AD.  Baseline: PF R/L 2, NT for hip strength  Goal status: Ongoing    3.  Patient will negotiate a flight of stairs as evidence of improved mobility and improved surgical healing  Baseline: Not performed  Goal status: Ongoing    4.  Patient will ambulate 1,000 ft during 23m67mas evidence that her left foot is  healing and she is capable of ambulating community level distances.  Baseline: 900 ft  Goal status: Achieved        PLAN:  PT FREQUENCY: 1-2x/week   PT DURATION: 10 weeks   PLANNED  INTERVENTIONS: Therapeutic exercises, Neuromuscular re-education, Balance training, Gait training, Patient/Family education, Self Care, Joint mobilization, Joint manipulation, Stair training, DME instructions, Aquatic Therapy, Electrical stimulation, Cryotherapy, Moist heat, scar mobilization, Manual therapy, and Re-evaluation   PLAN FOR NEXT SESSION: Dynamic balance exercsies. [place] exercises for LE strengthening and balance exercises      Bradly Chris PT, DPT  03/11/2022, 10:21 AM

## 2022-03-13 ENCOUNTER — Ambulatory Visit: Payer: PPO | Admitting: Physical Therapy

## 2022-03-13 ENCOUNTER — Encounter: Payer: Self-pay | Admitting: Physical Therapy

## 2022-03-13 DIAGNOSIS — M6281 Muscle weakness (generalized): Secondary | ICD-10-CM

## 2022-03-13 DIAGNOSIS — R262 Difficulty in walking, not elsewhere classified: Secondary | ICD-10-CM

## 2022-03-13 DIAGNOSIS — M79671 Pain in right foot: Secondary | ICD-10-CM | POA: Diagnosis not present

## 2022-03-13 NOTE — Therapy (Signed)
OUTPATIENT PHYSICAL THERAPY TREATMENT NOTE   Patient Name: Claudia Terry MRN: [id number] DOB:May 26, 1946, 76 y.o., female Today's Date: 03/13/2022  PCP: Dr. Shirline Frees  REFERRING PROVIDER: Dr. 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 Tiffany Kocher   History of 2019 novel coronavirus disease (COVID-19) 01/15/2021   Hypertension    Low back pain 04/1994   Menopause    Osteoarthritis    Restless leg syndrome 1999   Past Surgical History:  Procedure Laterality Date   ACHILLES TENDON SURGERY Right 11/16/2021   Procedure: PTAL - ACHILLES TENDON MICROTENOTOMY;  Surgeon: Caroline More, DPM;  Location: [facility];  Service: Podiatry;  Laterality: Right;   ARTHRODESIS METATARSAL Right 11/16/2021   Procedure: ARTHRODESIS METATARSAL, T-N, C-C OR MET-CUNNEIFORM, NCJ;  Surgeon: Caroline More, DPM;  Location: [facility];  Service: Podiatry;  Laterality: Right;   CATARACT EXTRACTION     CATARACT EXTRACTION W/PHACO Left 08/15/2020   Procedure: CATARACT EXTRACTION PHACO AND INTRAOCULAR LENS PLACEMENT (IOC) LEFT 6.60 00:39.8;  Surgeon: Birder Robson, MD;  Location: [place];  Service: Ophthalmology;  Laterality: Left;   CATARACT EXTRACTION W/PHACO Right 08/29/2020   Procedure: CATARACT EXTRACTION PHACO AND INTRAOCULAR LENS PLACEMENT (IOC) RIGHT 6.94 00:42.3;  Surgeon: Birder Robson, MD;  Location: [place];  Service:  Ophthalmology;  Laterality: Right;   [place]   X2   COLONOSCOPY  12/06/2011   normal   COLONOSCOPY WITH PROPOFOL N/A 04/13/2018   Procedure: COLONOSCOPY WITH PROPOFOL;  Surgeon: Manya Silvas, MD;  Location: [facility];  Service: Endoscopy;  Laterality: N/A;   ESOPHAGOGASTRODUODENOSCOPY (EGD) WITH PROPOFOL N/A 02/23/2015   Procedure: ESOPHAGOGASTRODUODENOSCOPY (EGD) WITH PROPOFOL;  Surgeon: Manya Silvas, MD;  Location: [facility] ENDOSCOPY;  Service: Endoscopy;  Laterality: N/A;   ESOPHAGOGASTRODUODENOSCOPY (EGD) WITH PROPOFOL N/A 04/13/2018   Procedure: ESOPHAGOGASTRODUODENOSCOPY (EGD) WITH PROPOFOL;  Surgeon: Manya Silvas, MD;  Location: [facility]COPY;  Service: Endoscopy;  Laterality: N/A;   FOOT ARTHRODESIS Right 11/16/2021   Procedure: ARTHRODESIS FOOT; TRIPLE;  Surgeon: Caroline More, DPM;  Location: [facility];  Service: Podiatry;  Laterality: Right;   FOOT SURGERY     with screws   HARDWARE REMOVAL Right 11/16/2021   Procedure: HARDWARE REMOVAL;  Surgeon: Caroline More, DPM;  Location: [facility];  Service: Podiatry;  Laterality: Right;   HIP CLOSED REDUCTION Left 08/08/2015   Procedure: CLOSED REDUCTION HIP;  Surgeon: Dereck Leep, MD;  Location: [facility];  Service: Orthopedics;  Laterality: Left;   HIP CLOSED REDUCTION Left 09/18/2015   Procedure: CLOSED REDUCTION HIP;  Surgeon: Hessie Knows, MD;  Location: [facility];  Service: Orthopedics;  Laterality: Left;   HIP CLOSED REDUCTION Left 12/01/2015   Procedure: CLOSED REDUCTION HIP;  Surgeon: Dereck Leep, MD;  Location: [facility];  Service: Orthopedics;  Laterality: Left;  no prep no incision   JOINT REPLACEMENT  10/2016   left knee   KNEE ARTHROPLASTY Left 10/28/2016   Procedure: COMPUTER ASSISTED TOTAL KNEE ARTHROPLASTY;  Surgeon: Dereck Leep, MD;  Location: [facility];  Service: Orthopedics;  Laterality: Left;   KNEE ARTHROPLASTY Right 07/07/2017   Procedure: COMPUTER ASSISTED TOTAL KNEE  ARTHROPLASTY;  Surgeon: Dereck Leep, MD;  Location: [facility];  Service: Orthopedics;  Laterality: Right;   KNEE ARTHROSCOPY Right 11/14/2014   Procedure: ARTHROSCOPY KNEE;  Surgeon: Dereck Leep, MD;  Location: [facility];  Service: Orthopedics;  Laterality: Right;  Posterior horn menicus, anterior lateral menicus grade 3 chondromalacia   phlebitis groin     right foot surgery     SHOULDER ARTHROSCOPY WITH OPEN ROTATOR CUFF REPAIR Left 03/10/2019   Procedure: LEFT SHOULDER ARTHROSCOPY, ARTHROSCOPIC ROTATOR CUFF REPAIR, DISTAL CLAVICLE EXCISION, SUBACROMIAL DECOMPRESSION, INTRA-ARTICULAR DEBRIDEMENT ;  Surgeon: Lovell Sheehan, MD;  Location: [facility];  Service: Orthopedics;  Laterality: Left;   SHOULDER ARTHROSCOPY WITH ROTATOR CUFF REPAIR Left 06/21/2019   Procedure: SHOULDER ARTHROSCOPY WITH EXTENSIVE INTRAARTICULAR DEBRIDEMENT WITH REMOVAL OF HARDWARE;  Surgeon: Lovell Sheehan, MD;  Location: [place];  Service: Orthopedics;  Laterality: Left;   TONSILLECTOMY     TOTAL HIP ARTHROPLASTY Left 05/08/2011   necrosis of hip   TOTAL HIP ARTHROPLASTY Right 06/14/2015   Procedure: TOTAL HIP ARTHROPLASTY;  Surgeon: Dereck Leep, MD;  Location: [facility];  Service: Orthopedics;  Laterality: Right;   TOTAL HIP REVISION Left 02/21/2016   Procedure: TOTAL HIP REVISION;  Surgeon: Dereck Leep, MD;  Location: [facility];  Service: Orthopedics;  Laterality: Left;   Patient Active Problem List   Diagnosis Date Noted   Pain, postoperative, acute 11/16/2021   Generalized osteoarthritis of multiple sites 07/28/2018   Varicose veins of both legs with edema 07/28/2018   FH: colon polyps 02/10/2018   S/P total knee arthroplasty 07/07/2017   Status post total left knee replacement 10/28/2016   Failed total hip arthroplasty with dislocation ([place]) 12/01/2015   S/P closed reduction of dislocated total hip prosthesis 08/08/2015   S/P total hip arthroplasty 06/14/2015   Benign essential HTN  12/28/2013   Acid reflux 12/28/2013   HLD (hyperlipidemia) 12/28/2013   Restless leg syndrome 12/28/2013   Depression 1999    REFERRING DIAG:  S/p R achiless tendon lengthening and right ankle arthrodesis.     THERAPY DIAG:  Pain in right foot  Difficulty in walking, not elsewhere classified  Muscle weakness (generalized)  Rationale for Evaluation and Treatment Rehabilitation  PERTINENT HISTORY: Pt reports s/p 12 weeks after undergoing right Achilles tendon lengthening, triple arthrodesis, naviculocuneiform jont fusion. She has some post surgical issues with screws breaking and a wound infection. However, despite the issues foot is healing well as evidenced by most recent imaging. She has a long history of ostearthritis that runs in her family. 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   PRECAUTIONS: WBAT  SUBJECTIVE:   Pt reports tripping and falling into her pantry over weekend. She sustained a couple of bruises but did not experience any broken bones. She has been continuing to use rollator for longer distances and cane for shorter distances.     PAIN:  Are you having pain? No   OBJECTIVE: (objective measures completed at initial evaluation unless otherwise dated)  VITALS:  BP 128/70 HR 76 SpO2 100   DIAGNOSTIC FINDINGS: CT scan 01/30/22: CLINICAL DATA: Achilles tendon lengthening. Triple arthrodesis. Ten week follow-up evaluation.  EXAM: CT OF THE RIGHT FOOT WITHOUT CONTRAST  CT OF THE RIGHT ANKLE WITHOUT CONTRAST  TECHNIQUE: Multidetector CT imaging of the right foot was performed according to the standard protocol. Multiplanar CT image reconstructions were also generated.  Multidetector CT imaging of the right ankle was performed according to the standard protocol. Multiplanar CT image reconstructions were also generated.  RADIATION DOSE REDUCTION: This exam was performed according to the departmental dose-optimization program which  includes automated exposure control, adjustment of the mA and/or kV according to patient size and/or use of iterative reconstruction technique.  COMPARISON: None Available.  FINDINGS: Bones/Joint/Cartilage  No acute fracture or dislocation. Subtalar arthrodesis transfixed with 2 cannulated screws with developing bone bridging across the posterior subtalar joint. Talonavicular arthrodesis transfixed with dorsal medial sideplate and multiple interlocking screws with severe joint space narrowing and a small area possible bone bridging along the medial aspect. Arthrodesis of the navicular-medial cuneiform with osseous bridging across the joint space. Calcaneocuboid arthrodesis with a small area of bone bridging along the mid aspect. No hardware failure or complication.  Osseous fusion of the second TMT joint. Moderate osteoarthritis of the navicular-middle cuneiform. Normal alignment. Small ankle joint effusion. Small plantar calcaneal spur.  Ligaments  Ligaments are suboptimally evaluated by CT.  Muscles and Tendons Muscles are normal. No muscle atrophy. No intramuscular fluid collection or hematoma. Flexor and extensor compartment tendons are intact. Peroneal tendons are intact. Mild thickening of the Achilles tendon likely reflecting postsurgical changes. Punctate calcifications in the medial band of the plantar fascia likely reflecting sequela prior plantar fasciitis.  Soft tissue No fluid collection or hematoma. No soft tissue mass. Soft tissue swelling along the dorsal aspect of the foot.  IMPRESSION: 1. Subtalar arthrodesis transfixed with 2 cannulated screws with developing bone bridging across the posterior subtalar joint. 2. Talonavicular arthrodesis transfixed with dorsal medial sideplate and multiple interlocking screws with severe joint space narrowing and a small area possible bone bridging along the medial aspect. 3. Arthrodesis of the navicular-medial  cuneiform with osseous bridging across the joint space. Calcaneocuboid arthrodesis with a small area of bone bridging along the mid aspect. 4. Osseous fusion of the second TMT joint. 5. Moderate osteoarthritis of the navicular-middle cuneiform.   Electronically Signed By: Kathreen Devoid M.D. On: 02/01/2022 07:17   X-rays: Right foot 3 views AP, lateral, lateral oblique: Status post triple arthrodesis with naviculocuneiform joint fusion. Medial column plate appears to be intact with screws the appropriate size and length. Screws appear the area to be the appropriate size and length at the subtalar joint level and staples appear to be providing compression across the calcaneocuboid joint. Unfortunately patient does appear to have some hardware broken, screw is broken at the talonavicular joint and one of the staples is broken at the calcaneocuboid joint. Foot appears to be in better alignment compared to preop but appears to have lost some correction compared to initial postop imaging as there appears to be reduction in calcaneal inclination angle and talar declination angle appears to be increased, forefoot abduction angle also appears to be increased compared to initial postoperative imaging. Joint still appear to be in stable position and improved compared to preop clinically. Noticed more bony consolidation across the navicular cuneiform joint, talonavicular joint, calcaneocuboid joint, and subtalar joints.   PATIENT SURVEYS:  FOTO 47/100 with target of 60   COGNITION:  Overall cognitive status: Within functional limits for tasks assessed                          SENSATION: WFL   EDEMA: Deferred, lipoma on lateral malleolus of right foot.    MUSCLE LENGTH: Hamstrings: Right 70 deg; Left 70 deg with restriction  Marcello Moores test: Right NT deg; Left NT  deg   POSTURE: rounded shoulders and forward head   PALPATION: Lipoma on lateral side of right foot along right malleolus     LOWER  EXTREMITY ROM:  Active  Right 02/20/2022 Left 02/20/2022  Hip flexion 120 120  Hip extension 30 30  Hip abduction 45 45  Hip adduction 30 30  Hip internal rotation 45 45  Hip external rotation 45 45  Knee flexion 135 135  Knee extension 0 0  Ankle dorsiflexion 20 20  Ankle plantarflexion 15/20 20/20  Ankle inversion 20/25 35/35  Ankle eversion 10/15 15/15   (Blank rows = not tested)        LOWER EXTREMITY MMT:   MMT Right eval Left eval Right  Left  Hip flexion        Hip extension        Hip abduction     3+ 3+  Hip adduction     4- 4-  Hip internal rotation        Hip external rotation        Knee flexion 5 5    Knee extension 5 5    Ankle dorsiflexion 5 5    Ankle plantarflexion 2 (Long Sitting) NT  2  Ankle inversion 5 5    Ankle eversion 5 5     (Blank rows = not tested)     FUNCTIONAL TESTS:  6 minute walk test: 900 ft with use of rollator    GAIT: Distance walked: 50 ft  Assistive device utilized: Environmental consultant - 4 wheeled Level of assistance: Modified independence Comments: Pt donning surgical boot so gait assessment deferred        TODAY'S TREATMENT:  03/13/22:  Gait Training   Ambulate outside in parking lot 1000 ft using SPC and right ankle brace  -min VC to keep right toes straight and avoid ER  10 meters x 10 without [place] of SPC or right ankle brace  -min VC to maintain straight toes on right foot    THEREX   Standing Hip Abduction with BUE support 1 x 10  Standing Hip Abduction with 1 UE support 1 x 10  Standing Hip Abduction with 2 finger support 1 x 10   NMR   Semi-tandem with eyes open 5 x 10 sec  Semi-tandem with horizontal head turns 3 x 10  Semi-tandem with vertical head turns 3 x 10  Semi-tandem with eyes closed 5 x 10 sec   03/11/22:  THEREX   Seated Soleus Heel Raises #10 1 x 10  Seated Soleus Heel Raises #15 1 x 10 Seated Soleus Heel Raises #20 1 x 10   NMR   Toe Taps onto 4 inch step 3 x 10    03/06/22:  THEREX    Standing Heel Raises 1 x 10  Standing Heel Raises with BUE off 1 ft step 1 x 10  Standing Calf Stretch on LLE 3 x 30 sec   NMR  Semi-tandem R + L 5 x 10 sec  Semi-tandem R + L 3 x 10 head turns   Gait Training  10 m walk with use of SPC and swing through gait -min VC for increased step length on RLE    03/04/22:  There.Ex:   Seated foot slides into great toe extension: 2x20 with 2-3 sec holds  Seated ankle AROM :  R Gastroc stretch with towel: 3x30 sec holds  R Ankle ABC's: x2 capital letters  Seated ankle IN/EVER on slick surface for increased ROM 2x15 each direction Seated arch lifts for improved intrinsic musculature control 2x15 Seated towel scrunches with use of intrinsic toe muscles, 2x15   Manual:  Seated metatarsal fan mobilization, 30 sec bouts for each metatarsal, Grades II-III for pain modulation and improved joint ROM PROM of ankle joint in all planes of mobility for increased ROM and pain modulation with movement      PATIENT EDUCATION:  Education details: form and technique for appropriate exercise  Person educated: Patient Education method: Customer service manager Education comprehension: verbalized understanding, returned demonstration, and verbal cues required     HOME EXERCISE PROGRAM:  Access Code: [email or web address] URL: [email or web address] Date: 03/13/2022 Prepared by: Bradly Chris  Exercises - Standing Hip Abduction with Counter Support  - 1 x daily - 3 x weekly - 3 sets - 10 reps - Prone Quadriceps Stretch with Strap  - 1 x daily - 7 x weekly - 3 reps - 30-60 hold - Standing Bilateral Heel Raise on Step  - 1 x daily - 3 x weekly - 3 sets - 10 reps - Standing Toe Taps  - 3 x weekly - 3 sets - 10 reps - Seated Heel Raise  - 3 x weekly - 3 sets - 10 reps - Half Tandem Stance Balance with Eyes Closed  - 1 x daily - 7 x weekly - 5 reps - 10 hold   ASSESSMENT:   CLINICAL IMPRESSION:  Pt shows improvement in gait with  ability to negotiate uneven surfaces with SPC instead of rollator and improvement in static balance with ability to perform half tandem with eyes closed. She shows increased ER of right foot to clear swing foot and increased hip drop on right stance foot. She will benefit from skilled PT to increase her static and dynamic balance and to return to walking without an AD for improved mobility and to decrease her risk of falling.     OBJECTIVE IMPAIRMENTS Abnormal gait, decreased balance, decreased endurance, decreased mobility, difficulty walking, decreased ROM, decreased strength, hypomobility, and impaired flexibility.    ACTIVITY LIMITATIONS standing, squatting, stairs, and locomotion level   PARTICIPATION LIMITATIONS: cleaning, shopping, and community activity   PERSONAL FACTORS Age, Fitness, and 1 comorbidity: family history of arthritis  are also affecting patient's functional outcome.    REHAB POTENTIAL: Good   CLINICAL DECISION MAKING: Stable/uncomplicated   EVALUATION COMPLEXITY: Low     GOALS: Goals reviewed with patient? No   SHORT TERM GOALS: Target date: 03/07/2022  Pt will be independent with HEP in order to improve strength and balance in order to decrease fall risk and improve function at home and work. Baseline: NT  Goal status: INITIAL   2.  Patient will ambulate without donning surgical boot as evidenced of tissue healing and improvement in ankle stability.  Baseline: Wearing surgical boot  Goal status: Achieved    3.  Pt will increase 6MWT by at least 34m(1653f in order to demonstrate clinically significant improvement in cardiopulmonary endurance and community ambulation Baseline: 900 ft  Goal status: Ongoing        LONG TERM  GOALS: Target date: 05/02/2022    Patient will have improved function and activity level as evidenced by an increase in FOTO score by 10 points or more.  Baseline: 47/100 with target of 60 Goal status: Ongoing    2.  Patient will  improve RLE strength to be symmetrical with LLE for improved gait mechanics and weight bearing without use of AD.  Baseline: PF R/L 2, NT for hip strength  Goal status: Ongoing    3.  Patient will negotiate a flight of stairs as evidence of improved mobility and improved surgical healing  Baseline: Not performed  Goal status: Ongoing    4.  Patient will ambulate 1,000 ft during 82mT as evidence that her left foot is healing and she is capable of ambulating community level distances.  Baseline: 900 ft  Goal status: Achieved        PLAN:  PT FREQUENCY: 1-2x/week   PT DURATION: 10 weeks   PLANNED INTERVENTIONS: Therapeutic exercises, Neuromuscular re-education, Balance training, Gait training, Patient/Family education, Self Care, Joint mobilization, Joint manipulation, Stair training, DME instructions, Aquatic Therapy, Electrical stimulation, Cryotherapy, Moist heat, scar mobilization, Manual therapy, and Re-evaluation   PLAN FOR NEXT SESSION: Dynamic balance exercises, gait, and stair training. [place] exercises for LE strengthening and balance exercises      DBradly ChrisPT, DPT  03/13/2022, 10:38 AM

## 2022-03-18 ENCOUNTER — Ambulatory Visit: Payer: PPO | Admitting: Physical Therapy

## 2022-03-20 ENCOUNTER — Encounter: Payer: Self-pay | Admitting: Physical Therapy

## 2022-03-20 ENCOUNTER — Ambulatory Visit: Payer: PPO | Admitting: Physical Therapy

## 2022-03-20 DIAGNOSIS — R262 Difficulty in walking, not elsewhere classified: Secondary | ICD-10-CM

## 2022-03-20 DIAGNOSIS — M79671 Pain in right foot: Secondary | ICD-10-CM

## 2022-03-20 NOTE — Therapy (Signed)
OUTPATIENT PHYSICAL THERAPY TREATMENT NOTE   Patient Name: Claudia Terry MRN: [id number] DOB:[birth year], 76 y.o., female Today's Date: 03/20/2022  PCP: Dr. Shirline Frees  REFERRING PROVIDER: Dr. Shirline Frees   END OF SESSION:   PT End of Session - 03/20/22 1337     Visit Number 8    Number of Visits 20    Date for PT Re-Evaluation 05/01/22    Authorization Type [id number]    Authorization - Visit Number 8    Authorization - Number of Visits 20    Progress Note Due on Visit 10    PT Start Time 3893    PT Stop Time 1415    PT Time Calculation (min) 40 min    Equipment Utilized During Treatment Gait belt    Activity Tolerance Patient tolerated treatment well    Behavior During Therapy WFL for tasks assessed/performed                Past Medical History:  Diagnosis Date   Arthritis    Complication of anesthesia    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 Tiffany Kocher   History of 2019 novel coronavirus disease (COVID-19) 01/15/2021   Hypertension    Low back pain 04/1994   Menopause    Osteoarthritis    Restless leg syndrome 1999   Past Surgical History:  Procedure Laterality Date   ACHILLES TENDON SURGERY Right 11/16/2021   Procedure: PTAL - ACHILLES TENDON MICROTENOTOMY;  Surgeon: Caroline More, DPM;  Location: [facility];  Service: Podiatry;  Laterality: Right;   ARTHRODESIS METATARSAL Right 11/16/2021   Procedure: ARTHRODESIS METATARSAL, T-N, C-C OR MET-CUNNEIFORM, NCJ;  Surgeon: Caroline More, DPM;  Location: [facility];  Service: Podiatry;  Laterality: Right;   CATARACT EXTRACTION     CATARACT EXTRACTION W/PHACO Left 08/15/2020   Procedure: CATARACT EXTRACTION PHACO AND INTRAOCULAR LENS PLACEMENT (IOC) LEFT 6.60 00:39.8;  Surgeon: Birder Robson, MD;  Location: [place];  Service: Ophthalmology;  Laterality: Left;   CATARACT EXTRACTION W/PHACO Right 08/29/2020   Procedure: CATARACT EXTRACTION PHACO  AND INTRAOCULAR LENS PLACEMENT (IOC) RIGHT 6.94 00:42.3;  Surgeon: Birder Robson, MD;  Location: [place];  Service: Ophthalmology;  Laterality: Right;   [place]   X2   COLONOSCOPY  12/06/2011   normal   COLONOSCOPY WITH PROPOFOL N/A 04/13/2018   Procedure: COLONOSCOPY WITH PROPOFOL;  Surgeon: Manya Silvas, MD;  Location: [facility];  Service: Endoscopy;  Laterality: N/A;   ESOPHAGOGASTRODUODENOSCOPY (EGD) WITH PROPOFOL N/A 02/23/2015   Procedure: ESOPHAGOGASTRODUODENOSCOPY (EGD) WITH PROPOFOL;  Surgeon: Manya Silvas, MD;  Location: [facility];  Service: Endoscopy;  Laterality: N/A;   ESOPHAGOGASTRODUODENOSCOPY (EGD) WITH PROPOFOL N/A 04/13/2018   Procedure: ESOPHAGOGASTRODUODENOSCOPY (EGD) WITH PROPOFOL;  Surgeon: Manya Silvas, MD;  Location: [facility];  Service: Endoscopy;  Laterality: N/A;   FOOT ARTHRODESIS Right 11/16/2021   Procedure: ARTHRODESIS FOOT; TRIPLE;  Surgeon: Caroline More, DPM;  Location: [facility];  Service: Podiatry;  Laterality: Right;   FOOT SURGERY     with screws   HARDWARE REMOVAL Right 11/16/2021   Procedure: HARDWARE REMOVAL;  Surgeon: Caroline More, DPM;  Location: [facility];  Service: Podiatry;  Laterality: Right;   HIP CLOSED REDUCTION Left 08/08/2015   Procedure: CLOSED REDUCTION HIP;  Surgeon: Dereck Leep, MD;  Location: [facility];  Service: Orthopedics;  Laterality: Left;   HIP CLOSED REDUCTION Left 09/18/2015  Procedure: CLOSED REDUCTION HIP;  Surgeon: Hessie Knows, MD;  Location: [facility];  Service: Orthopedics;  Laterality: Left;   HIP CLOSED REDUCTION Left 12/01/2015   Procedure: CLOSED REDUCTION HIP;  Surgeon: Dereck Leep, MD;  Location: [facility];  Service: Orthopedics;  Laterality: Left;  no prep no incision   JOINT REPLACEMENT  10/2016   left knee   KNEE ARTHROPLASTY Left 10/28/2016   Procedure: COMPUTER ASSISTED TOTAL KNEE ARTHROPLASTY;  Surgeon: Dereck Leep, MD;  Location: [facility];   Service: Orthopedics;  Laterality: Left;   KNEE ARTHROPLASTY Right 07/07/2017   Procedure: COMPUTER ASSISTED TOTAL KNEE ARTHROPLASTY;  Surgeon: Dereck Leep, MD;  Location: [facility];  Service: Orthopedics;  Laterality: Right;   KNEE ARTHROSCOPY Right 11/14/2014   Procedure: ARTHROSCOPY KNEE;  Surgeon: Dereck Leep, MD;  Location: [facility];  Service: Orthopedics;  Laterality: Right;  Posterior horn menicus, anterior lateral menicus grade 3 chondromalacia   phlebitis groin     right foot surgery     SHOULDER ARTHROSCOPY WITH OPEN ROTATOR CUFF REPAIR Left 03/10/2019   Procedure: LEFT SHOULDER ARTHROSCOPY, ARTHROSCOPIC ROTATOR CUFF REPAIR, DISTAL CLAVICLE EXCISION, SUBACROMIAL DECOMPRESSION, INTRA-ARTICULAR DEBRIDEMENT ;  Surgeon: Lovell Sheehan, MD;  Location: [facility];  Service: Orthopedics;  Laterality: Left;   SHOULDER ARTHROSCOPY WITH ROTATOR CUFF REPAIR Left 06/21/2019   Procedure: SHOULDER ARTHROSCOPY WITH EXTENSIVE INTRAARTICULAR DEBRIDEMENT WITH REMOVAL OF HARDWARE;  Surgeon: Lovell Sheehan, MD;  Location: [place];  Service: Orthopedics;  Laterality: Left;   TONSILLECTOMY     TOTAL HIP ARTHROPLASTY Left 05/08/2011   necrosis of hip   TOTAL HIP ARTHROPLASTY Right 06/14/2015   Procedure: TOTAL HIP ARTHROPLASTY;  Surgeon: Dereck Leep, MD;  Location: [facility];  Service: Orthopedics;  Laterality: Right;   TOTAL HIP REVISION Left 02/21/2016   Procedure: TOTAL HIP REVISION;  Surgeon: Dereck Leep, MD;  Location: [facility];  Service: Orthopedics;  Laterality: Left;   Patient Active Problem List   Diagnosis Date Noted   Pain, postoperative, acute 11/16/2021   Generalized osteoarthritis of multiple sites 07/28/2018   Varicose veins of both legs with edema 07/28/2018   FH: colon polyps 02/10/2018   S/P total knee arthroplasty 07/07/2017   Status post total left knee replacement 10/28/2016   Failed total hip arthroplasty with dislocation ([place]) 12/01/2015   S/P closed  reduction of dislocated total hip prosthesis 08/08/2015   S/P total hip arthroplasty 06/14/2015   Benign essential HTN 12/28/2013   Acid reflux 12/28/2013   HLD (hyperlipidemia) 12/28/2013   Restless leg syndrome 12/28/2013   Depression 1999    REFERRING DIAG:  S/p R achiless tendon lengthening and right ankle arthrodesis.     THERAPY DIAG:  Pain in right foot  Difficulty in walking, not elsewhere classified  Rationale for Evaluation and Treatment Rehabilitation  PERTINENT HISTORY: Pt reports s/p 12 weeks after undergoing right Achilles tendon lengthening, triple arthrodesis, naviculocuneiform jont fusion. She has some post surgical issues with screws breaking and a wound infection. However, despite the issues foot is healing well as evidenced by most recent imaging. She has a long history of ostearthritis that runs in her family. 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   PRECAUTIONS: WBAT  SUBJECTIVE:   Pt reports tripping and falling into her pantry over weekend. She sustained a couple of bruises but did not experience any broken bones. She has been continuing to use rollator for longer distances and  cane for shorter distances.     PAIN:  Are you having pain? Yes: NPRS scale: 2-3/10 Pain location: Top surface of right foot  Pain description: Achy Aggravating factors: Walking on foot  Relieving factors: Keeping weight off foot    OBJECTIVE: (objective measures completed at initial evaluation unless otherwise dated)  VITALS: BP 128/70 HR 76 SpO2 100   DIAGNOSTIC FINDINGS: CT scan 01/30/22: CLINICAL DATA: Achilles tendon lengthening. Triple arthrodesis. Ten week follow-up evaluation.  EXAM: CT OF THE RIGHT FOOT WITHOUT CONTRAST  CT OF THE RIGHT ANKLE WITHOUT CONTRAST  TECHNIQUE: Multidetector CT imaging of the right foot was performed according to the standard protocol. Multiplanar CT image reconstructions were also  generated.  Multidetector CT imaging of the right ankle was performed according to the standard protocol. Multiplanar CT image reconstructions were also generated.  RADIATION DOSE REDUCTION: This exam was performed according to the departmental dose-optimization program which includes automated exposure control, adjustment of the mA and/or kV according to patient size and/or use of iterative reconstruction technique.  COMPARISON: None Available.  FINDINGS: Bones/Joint/Cartilage  No acute fracture or dislocation. Subtalar arthrodesis transfixed with 2 cannulated screws with developing bone bridging across the posterior subtalar joint. Talonavicular arthrodesis transfixed with dorsal medial sideplate and multiple interlocking screws with severe joint space narrowing and a small area possible bone bridging along the medial aspect. Arthrodesis of the navicular-medial cuneiform with osseous bridging across the joint space. Calcaneocuboid arthrodesis with a small area of bone bridging along the mid aspect. No hardware failure or complication.  Osseous fusion of the second TMT joint. Moderate osteoarthritis of the navicular-middle cuneiform. Normal alignment. Small ankle joint effusion. Small plantar calcaneal spur.  Ligaments  Ligaments are suboptimally evaluated by CT.  Muscles and Tendons Muscles are normal. No muscle atrophy. No intramuscular fluid collection or hematoma. Flexor and extensor compartment tendons are intact. Peroneal tendons are intact. Mild thickening of the Achilles tendon likely reflecting postsurgical changes. Punctate calcifications in the medial band of the plantar fascia likely reflecting sequela prior plantar fasciitis.  Soft tissue No fluid collection or hematoma. No soft tissue mass. Soft tissue swelling along the dorsal aspect of the foot.  IMPRESSION: 1. Subtalar arthrodesis transfixed with 2 cannulated screws with developing bone bridging  across the posterior subtalar joint. 2. Talonavicular arthrodesis transfixed with dorsal medial sideplate and multiple interlocking screws with severe joint space narrowing and a small area possible bone bridging along the medial aspect. 3. Arthrodesis of the navicular-medial cuneiform with osseous bridging across the joint space. Calcaneocuboid arthrodesis with a small area of bone bridging along the mid aspect. 4. Osseous fusion of the second TMT joint. 5. Moderate osteoarthritis of the navicular-middle cuneiform.   Electronically Signed By: Kathreen Devoid M.D. On: 02/01/2022 07:17   X-rays: Right foot 3 views AP, lateral, lateral oblique: Status post triple arthrodesis with naviculocuneiform joint fusion. Medial column plate appears to be intact with screws the appropriate size and length. Screws appear the area to be the appropriate size and length at the subtalar joint level and staples appear to be providing compression across the calcaneocuboid joint. Unfortunately patient does appear to have some hardware broken, screw is broken at the talonavicular joint and one of the staples is broken at the calcaneocuboid joint. Foot appears to be in better alignment compared to preop but appears to have lost some correction compared to initial postop imaging as there appears to be reduction in calcaneal inclination angle and talar declination angle appears to be increased, forefoot  abduction angle also appears to be increased compared to initial postoperative imaging. Joint still appear to be in stable position and improved compared to preop clinically. Noticed more bony consolidation across the navicular cuneiform joint, talonavicular joint, calcaneocuboid joint, and subtalar joints.   PATIENT SURVEYS:  FOTO 47/100 with target of 60   COGNITION:  Overall cognitive status: Within functional limits for tasks assessed                          SENSATION: WFL   EDEMA: Deferred, lipoma on lateral  malleolus of right foot.    MUSCLE LENGTH: Hamstrings: Right 70 deg; Left 70 deg with restriction  Marcello Moores test: Right NT deg; Left NT  deg   POSTURE: rounded shoulders and forward head   PALPATION: Lipoma on lateral side of right foot along right malleolus     LOWER EXTREMITY ROM:  Active  Right 02/20/2022 Left 02/20/2022  Hip flexion 120 120  Hip extension 30 30  Hip abduction 45 45  Hip adduction 30 30  Hip internal rotation 45 45  Hip external rotation 45 45  Knee flexion 135 135  Knee extension 0 0  Ankle dorsiflexion 20 20  Ankle plantarflexion 15/20 20/20  Ankle inversion 20/25 35/35  Ankle eversion 10/15 15/15   (Blank rows = not tested)        LOWER EXTREMITY MMT:   MMT Right eval Left eval Right  Left  Hip flexion        Hip extension        Hip abduction     3+ 3+  Hip adduction     4- 4-  Hip internal rotation        Hip external rotation        Knee flexion 5 5    Knee extension 5 5    Ankle dorsiflexion 5 5    Ankle plantarflexion 2 (Long Sitting) NT  2  Ankle inversion 5 5    Ankle eversion 5 5     (Blank rows = not tested)     FUNCTIONAL TESTS:  6 minute walk test: 900 ft with use of rollator    GAIT: Distance walked: 50 ft  Assistive device utilized: Environmental consultant - 4 wheeled Level of assistance: Modified independence Comments: Pt donning surgical boot so gait assessment deferred        TODAY'S TREATMENT:  03/20/22:  THEREX   TM with BUE support at 1.5 mph for 5 min   NMR   10 m normal pace walk x 10  10 m vertical head turns x 8 10 m horizontal head turns x 8  10 m fast and slow x 6   Modified single leg stance 6 x 30 sec on water bottle     03/13/22:  Gait Training   Ambulate outside in parking lot 1000 ft using SPC and right ankle brace  -min VC to keep right toes straight and avoid ER  10 meters x 10 without [place] of SPC or right ankle brace  -min VC to maintain straight toes on right foot    THEREX   Standing Hip  Abduction with BUE support 1 x 10  Standing Hip Abduction with 1 UE support 1 x 10  Standing Hip Abduction with 2 finger support 1 x 10   NMR   Semi-tandem with eyes open 5 x 10 sec  Semi-tandem with horizontal head turns 3 x 10  Semi-tandem with  vertical head turns 3 x 10  Semi-tandem with eyes closed 5 x 10 sec   03/11/22:  THEREX   Seated Soleus Heel Raises #10 1 x 10  Seated Soleus Heel Raises #15 1 x 10 Seated Soleus Heel Raises #20 1 x 10   NMR   Toe Taps onto 4 inch step 3 x 10    03/06/22:  THEREX   Standing Heel Raises 1 x 10  Standing Heel Raises with BUE off 1 ft step 1 x 10  Standing Calf Stretch on LLE 3 x 30 sec   NMR  Semi-tandem R + L 5 x 10 sec  Semi-tandem R + L 3 x 10 head turns   Gait Training   10 m walk with use of SPC and swing through gait -min VC for increased step length on RLE    03/04/22:  There.Ex:   Seated foot slides into great toe extension: 2x20 with 2-3 sec holds  Seated ankle AROM :  R Gastroc stretch with towel: 3x30 sec holds  R Ankle ABC's: x2 capital letters  Seated ankle IN/EVER on slick surface for increased ROM 2x15 each direction Seated arch lifts for improved intrinsic musculature control 2x15 Seated towel scrunches with use of intrinsic toe muscles, 2x15   Manual:  Seated metatarsal fan mobilization, 30 sec bouts for each metatarsal, Grades II-III for pain modulation and improved joint ROM PROM of ankle joint in all planes of mobility for increased ROM and pain modulation with movement      PATIENT EDUCATION:  Education details: form and technique for appropriate exercise  Person educated: Patient Education method: Customer service manager Education comprehension: verbalized understanding, returned demonstration, and verbal cues required     HOME EXERCISE PROGRAM:  Access Code: [email or web address] URL: [email or web address] Date: 03/13/2022 Prepared by: Bradly Chris  Exercises -  Standing Hip Abduction with Counter Support  - 1 x daily - 3 x weekly - 3 sets - 10 reps - Prone Quadriceps Stretch with Strap  - 1 x daily - 7 x weekly - 3 reps - 30-60 hold - Standing Bilateral Heel Raise on Step  - 1 x daily - 3 x weekly - 3 sets - 10 reps - Standing Toe Taps  - 3 x weekly - 3 sets - 10 reps - Seated Heel Raise  - 3 x weekly - 3 sets - 10 reps - Half Tandem Stance Balance with Eyes Closed  - 1 x daily - 7 x weekly - 5 reps - 10 hold   ASSESSMENT:   CLINICAL IMPRESSION:  Pt shows improvement with dynamic balance and weight bearing with ability to perform head turns and cone weaves while walking. She did not experience and increase in right foot pain while performing these exercises. She did show difficulty maintaining single leg stance on RLE with decreased stance time and difficulty stepping over obstacles with left foot. She will continue benefit from skilled PT to increase her static and dynamic balance and to return to walking without an AD for improved mobility and to decrease her risk of falling.     OBJECTIVE IMPAIRMENTS Abnormal gait, decreased balance, decreased endurance, decreased mobility, difficulty walking, decreased ROM, decreased strength, hypomobility, and impaired flexibility.    ACTIVITY LIMITATIONS standing, squatting, stairs, and locomotion level   PARTICIPATION LIMITATIONS: cleaning, shopping, and community activity   PERSONAL FACTORS Age, Fitness, and 1 comorbidity: family history of arthritis  are also affecting patient's functional outcome.    REHAB POTENTIAL: Good  CLINICAL DECISION MAKING: Stable/uncomplicated   EVALUATION COMPLEXITY: Low     GOALS: Goals reviewed with patient? No   SHORT TERM GOALS: Target date: 03/07/2022  Pt will be independent with HEP in order to improve strength and balance in order to decrease fall risk and improve function at home and work. Baseline: NT  Goal status: INITIAL   2.  Patient will ambulate without  donning surgical boot as evidenced of tissue healing and improvement in ankle stability.  Baseline: Wearing surgical boot  Goal status: Achieved    3.  Pt will increase 6MWT by at least 54m(1648f in order to demonstrate clinically significant improvement in cardiopulmonary endurance and community ambulation Baseline: 900 ft  Goal status: Ongoing        LONG TERM GOALS: Target date: 05/02/2022    Patient will have improved function and activity level as evidenced by an increase in FOTO score by 10 points or more.  Baseline: 47/100 with target of 60 Goal status: Ongoing    2.  Patient will improve RLE strength to be symmetrical with LLE for improved gait mechanics and weight bearing without use of AD.  Baseline: PF R/L 2, NT for hip strength  Goal status: Ongoing    3.  Patient will negotiate a flight of stairs as evidence of improved mobility and improved surgical healing  Baseline: Not performed  Goal status: Ongoing    4.  Patient will ambulate 1,000 ft during 50m53mas evidence that her left foot is healing and she is capable of ambulating community level distances.  Baseline: 900 ft  Goal status: Achieved        PLAN:  PT FREQUENCY: 1-2x/week   PT DURATION: 10 weeks   PLANNED INTERVENTIONS: Therapeutic exercises, Neuromuscular re-education, Balance training, Gait training, Patient/Family education, Self Care, Joint mobilization, Joint manipulation, Stair training, DME instructions, Aquatic Therapy, Electrical stimulation, Cryotherapy, Moist heat, scar mobilization, Manual therapy, and Re-evaluation   PLAN FOR NEXT SESSION: Dynamic balance exercises, gait, and stair training. [place] exercises for LE strengthening and balance exercises      DanBradly Chris, DPT  03/20/2022, 1:38 PM

## 2022-03-21 ENCOUNTER — Ambulatory Visit: Payer: PPO | Admitting: Physical Therapy

## 2022-03-22 DIAGNOSIS — M2042 Other hammer toe(s) (acquired), left foot: Secondary | ICD-10-CM | POA: Diagnosis not present

## 2022-03-22 DIAGNOSIS — S9031XA Contusion of right foot, initial encounter: Secondary | ICD-10-CM | POA: Diagnosis not present

## 2022-03-22 DIAGNOSIS — M76821 Posterior tibial tendinitis, right leg: Secondary | ICD-10-CM | POA: Diagnosis not present

## 2022-03-22 DIAGNOSIS — M2041 Other hammer toe(s) (acquired), right foot: Secondary | ICD-10-CM | POA: Diagnosis not present

## 2022-03-22 DIAGNOSIS — M19071 Primary osteoarthritis, right ankle and foot: Secondary | ICD-10-CM | POA: Diagnosis not present

## 2022-03-22 DIAGNOSIS — M79671 Pain in right foot: Secondary | ICD-10-CM | POA: Diagnosis not present

## 2022-03-22 DIAGNOSIS — M216X1 Other acquired deformities of right foot: Secondary | ICD-10-CM | POA: Diagnosis not present

## 2022-03-22 DIAGNOSIS — S90121A Contusion of right lesser toe(s) without damage to nail, initial encounter: Secondary | ICD-10-CM | POA: Diagnosis not present

## 2022-03-22 DIAGNOSIS — M810 Age-related osteoporosis without current pathological fracture: Secondary | ICD-10-CM | POA: Diagnosis not present

## 2022-03-22 DIAGNOSIS — M216X2 Other acquired deformities of left foot: Secondary | ICD-10-CM | POA: Diagnosis not present

## 2022-03-22 DIAGNOSIS — M2141 Flat foot [pes planus] (acquired), right foot: Secondary | ICD-10-CM | POA: Diagnosis not present

## 2022-03-22 DIAGNOSIS — Z09 Encounter for follow-up examination after completed treatment for conditions other than malignant neoplasm: Secondary | ICD-10-CM | POA: Diagnosis not present

## 2022-03-25 ENCOUNTER — Ambulatory Visit: Payer: PPO | Admitting: Physical Therapy

## 2022-03-25 ENCOUNTER — Encounter: Payer: Self-pay | Admitting: Physical Therapy

## 2022-03-25 DIAGNOSIS — M79671 Pain in right foot: Secondary | ICD-10-CM

## 2022-03-25 DIAGNOSIS — R262 Difficulty in walking, not elsewhere classified: Secondary | ICD-10-CM

## 2022-03-25 DIAGNOSIS — M6281 Muscle weakness (generalized): Secondary | ICD-10-CM

## 2022-03-25 NOTE — Therapy (Signed)
OUTPATIENT PHYSICAL THERAPY TREATMENT NOTE   Patient Name: Claudia Terry MRN: [id number] DOB:07-08-1945, 76 y.o., female Today's Date: 03/25/2022  PCP: Dr. Shirline Frees  REFERRING PROVIDER: Dr. Shirline Frees   END OF SESSION:   PT End of Session - 03/25/22 1103     Visit Number 9    Number of Visits 20    Date for PT Re-Evaluation 05/01/22    Authorization Type HTA [id number]    Authorization Time Period 02/20/22-05/01/22    Authorization - Visit Number 9    Authorization - Number of Visits 20    Progress Note Due on Visit 10    PT Start Time 1105    PT Stop Time 1145    PT Time Calculation (min) 40 min    Equipment Utilized During Treatment Gait belt    Activity Tolerance Patient tolerated treatment well    Behavior During Therapy WFL for tasks assessed/performed                 Past Medical History:  Diagnosis Date   Arthritis    Complication of anesthesia    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 Tiffany Kocher   History of 2019 novel coronavirus disease (COVID-19) 01/15/2021   Hypertension    Low back pain 04/1994   Menopause    Osteoarthritis    Restless leg syndrome 1999   Past Surgical History:  Procedure Laterality Date   ACHILLES TENDON SURGERY Right 11/16/2021   Procedure: PTAL - ACHILLES TENDON MICROTENOTOMY;  Surgeon: Caroline More, DPM;  Location: [facility];  Service: Podiatry;  Laterality: Right;   ARTHRODESIS METATARSAL Right 11/16/2021   Procedure: ARTHRODESIS METATARSAL, T-N, C-C OR MET-CUNNEIFORM, NCJ;  Surgeon: Caroline More, DPM;  Location: [facility];  Service: Podiatry;  Laterality: Right;   CATARACT EXTRACTION     CATARACT EXTRACTION W/PHACO Left 08/15/2020   Procedure: CATARACT EXTRACTION PHACO AND INTRAOCULAR LENS PLACEMENT (IOC) LEFT 6.60 00:39.8;  Surgeon: Birder Robson, MD;  Location: [place];  Service: Ophthalmology;  Laterality: Left;   CATARACT EXTRACTION W/PHACO Right  08/29/2020   Procedure: CATARACT EXTRACTION PHACO AND INTRAOCULAR LENS PLACEMENT (IOC) RIGHT 6.94 00:42.3;  Surgeon: Birder Robson, MD;  Location: [place];  Service: Ophthalmology;  Laterality: Right;   [place]   X2   COLONOSCOPY  12/06/2011   normal   COLONOSCOPY WITH PROPOFOL N/A 04/13/2018   Procedure: COLONOSCOPY WITH PROPOFOL;  Surgeon: Manya Silvas, MD;  Location: [facility];  Service: Endoscopy;  Laterality: N/A;   ESOPHAGOGASTRODUODENOSCOPY (EGD) WITH PROPOFOL N/A 02/23/2015   Procedure: ESOPHAGOGASTRODUODENOSCOPY (EGD) WITH PROPOFOL;  Surgeon: Manya Silvas, MD;  Location: [facility];  Service: Endoscopy;  Laterality: N/A;   ESOPHAGOGASTRODUODENOSCOPY (EGD) WITH PROPOFOL N/A 04/13/2018   Procedure: ESOPHAGOGASTRODUODENOSCOPY (EGD) WITH PROPOFOL;  Surgeon: Manya Silvas, MD;  Location: [facility] ENDOSCOPY;  Service: Endoscopy;  Laterality: N/A;   FOOT ARTHRODESIS Right 11/16/2021   Procedure: ARTHRODESIS FOOT; TRIPLE;  Surgeon: Caroline More, DPM;  Location: [facility];  Service: Podiatry;  Laterality: Right;   FOOT SURGERY     with screws   HARDWARE REMOVAL Right 11/16/2021   Procedure: HARDWARE REMOVAL;  Surgeon: Caroline More, DPM;  Location: [facility];  Service: Podiatry;  Laterality: Right;   HIP CLOSED REDUCTION Left 08/08/2015   Procedure: CLOSED REDUCTION HIP;  Surgeon: Dereck Leep, MD;  Location: [facility];  Service: Orthopedics;  Laterality: Left;  HIP CLOSED REDUCTION Left 09/18/2015   Procedure: CLOSED REDUCTION HIP;  Surgeon: Hessie Knows, MD;  Location: [facility];  Service: Orthopedics;  Laterality: Left;   HIP CLOSED REDUCTION Left 12/01/2015   Procedure: CLOSED REDUCTION HIP;  Surgeon: Dereck Leep, MD;  Location: [facility];  Service: Orthopedics;  Laterality: Left;  no prep no incision   JOINT REPLACEMENT  10/2016   left knee   KNEE ARTHROPLASTY Left 10/28/2016   Procedure: COMPUTER ASSISTED TOTAL KNEE ARTHROPLASTY;   Surgeon: Dereck Leep, MD;  Location: [facility];  Service: Orthopedics;  Laterality: Left;   KNEE ARTHROPLASTY Right 07/07/2017   Procedure: COMPUTER ASSISTED TOTAL KNEE ARTHROPLASTY;  Surgeon: Dereck Leep, MD;  Location: [facility];  Service: Orthopedics;  Laterality: Right;   KNEE ARTHROSCOPY Right 11/14/2014   Procedure: ARTHROSCOPY KNEE;  Surgeon: Dereck Leep, MD;  Location: [facility];  Service: Orthopedics;  Laterality: Right;  Posterior horn menicus, anterior lateral menicus grade 3 chondromalacia   phlebitis groin     right foot surgery     SHOULDER ARTHROSCOPY WITH OPEN ROTATOR CUFF REPAIR Left 03/10/2019   Procedure: LEFT SHOULDER ARTHROSCOPY, ARTHROSCOPIC ROTATOR CUFF REPAIR, DISTAL CLAVICLE EXCISION, SUBACROMIAL DECOMPRESSION, INTRA-ARTICULAR DEBRIDEMENT ;  Surgeon: Lovell Sheehan, MD;  Location: [facility];  Service: Orthopedics;  Laterality: Left;   SHOULDER ARTHROSCOPY WITH ROTATOR CUFF REPAIR Left 06/21/2019   Procedure: SHOULDER ARTHROSCOPY WITH EXTENSIVE INTRAARTICULAR DEBRIDEMENT WITH REMOVAL OF HARDWARE;  Surgeon: Lovell Sheehan, MD;  Location: [place];  Service: Orthopedics;  Laterality: Left;   TONSILLECTOMY     TOTAL HIP ARTHROPLASTY Left 05/08/2011   necrosis of hip   TOTAL HIP ARTHROPLASTY Right 06/14/2015   Procedure: TOTAL HIP ARTHROPLASTY;  Surgeon: Dereck Leep, MD;  Location: [facility];  Service: Orthopedics;  Laterality: Right;   TOTAL HIP REVISION Left 02/21/2016   Procedure: TOTAL HIP REVISION;  Surgeon: Dereck Leep, MD;  Location: [facility];  Service: Orthopedics;  Laterality: Left;   Patient Active Problem List   Diagnosis Date Noted   Pain, postoperative, acute 11/16/2021   Generalized osteoarthritis of multiple sites 07/28/2018   Varicose veins of both legs with edema 07/28/2018   FH: colon polyps 02/10/2018   S/P total knee arthroplasty 07/07/2017   Status post total left knee replacement 10/28/2016   Failed total hip arthroplasty  with dislocation ([place]) 12/01/2015   S/P closed reduction of dislocated total hip prosthesis 08/08/2015   S/P total hip arthroplasty 06/14/2015   Benign essential HTN 12/28/2013   Acid reflux 12/28/2013   HLD (hyperlipidemia) 12/28/2013   Restless leg syndrome 12/28/2013   Depression 1999    REFERRING DIAG:  S/p R achiless tendon lengthening and right ankle arthrodesis.     THERAPY DIAG:  Pain in right foot  Difficulty in walking, not elsewhere classified  Muscle weakness (generalized)  Rationale for Evaluation and Treatment Rehabilitation  PERTINENT HISTORY: Pt reports s/p 12 weeks after undergoing right Achilles tendon lengthening, triple arthrodesis, naviculocuneiform jont fusion. She has some post surgical issues with screws breaking and a wound infection. However, despite the issues foot is healing well as evidenced by most recent imaging. She has a long history of ostearthritis that runs in her family. 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   PRECAUTIONS: WBAT  SUBJECTIVE:   Pt reports increased soreness in right foot and RLE since walking 5 miles yesterday after going to her grandson's lacrosse game. She continues  to complete exercises without much difficulty with the exception of static balance exercises.    PAIN:  Are you having pain? Yes: NPRS scale: 2-3/10 Pain location: Top surface of right foot  Pain description: Achy Aggravating factors: Walking on foot  Relieving factors: Keeping weight off foot    OBJECTIVE: (objective measures completed at initial evaluation unless otherwise dated)  VITALS: BP 128/70 HR 76 SpO2 100   DIAGNOSTIC FINDINGS: CT scan 01/30/22: CLINICAL DATA: Achilles tendon lengthening. Triple arthrodesis. Ten week follow-up evaluation.  EXAM: CT OF THE RIGHT FOOT WITHOUT CONTRAST  CT OF THE RIGHT ANKLE WITHOUT CONTRAST  TECHNIQUE: Multidetector CT imaging of the right foot was performed according to the  standard protocol. Multiplanar CT image reconstructions were also generated.  Multidetector CT imaging of the right ankle was performed according to the standard protocol. Multiplanar CT image reconstructions were also generated.  RADIATION DOSE REDUCTION: This exam was performed according to the departmental dose-optimization program which includes automated exposure control, adjustment of the mA and/or kV according to patient size and/or use of iterative reconstruction technique.  COMPARISON: None Available.  FINDINGS: Bones/Joint/Cartilage  No acute fracture or dislocation. Subtalar arthrodesis transfixed with 2 cannulated screws with developing bone bridging across the posterior subtalar joint. Talonavicular arthrodesis transfixed with dorsal medial sideplate and multiple interlocking screws with severe joint space narrowing and a small area possible bone bridging along the medial aspect. Arthrodesis of the navicular-medial cuneiform with osseous bridging across the joint space. Calcaneocuboid arthrodesis with a small area of bone bridging along the mid aspect. No hardware failure or complication.  Osseous fusion of the second TMT joint. Moderate osteoarthritis of the navicular-middle cuneiform. Normal alignment. Small ankle joint effusion. Small plantar calcaneal spur.  Ligaments  Ligaments are suboptimally evaluated by CT.  Muscles and Tendons Muscles are normal. No muscle atrophy. No intramuscular fluid collection or hematoma. Flexor and extensor compartment tendons are intact. Peroneal tendons are intact. Mild thickening of the Achilles tendon likely reflecting postsurgical changes. Punctate calcifications in the medial band of the plantar fascia likely reflecting sequela prior plantar fasciitis.  Soft tissue No fluid collection or hematoma. No soft tissue mass. Soft tissue swelling along the dorsal aspect of the foot.  IMPRESSION: 1. Subtalar arthrodesis  transfixed with 2 cannulated screws with developing bone bridging across the posterior subtalar joint. 2. Talonavicular arthrodesis transfixed with dorsal medial sideplate and multiple interlocking screws with severe joint space narrowing and a small area possible bone bridging along the medial aspect. 3. Arthrodesis of the navicular-medial cuneiform with osseous bridging across the joint space. Calcaneocuboid arthrodesis with a small area of bone bridging along the mid aspect. 4. Osseous fusion of the second TMT joint. 5. Moderate osteoarthritis of the navicular-middle cuneiform.   Electronically Signed By: Kathreen Devoid M.D. On: 02/01/2022 07:17   X-rays: Right foot 3 views AP, lateral, lateral oblique: Status post triple arthrodesis with naviculocuneiform joint fusion. Medial column plate appears to be intact with screws the appropriate size and length. Screws appear the area to be the appropriate size and length at the subtalar joint level and staples appear to be providing compression across the calcaneocuboid joint. Unfortunately patient does appear to have some hardware broken, screw is broken at the talonavicular joint and one of the staples is broken at the calcaneocuboid joint. Foot appears to be in better alignment compared to preop but appears to have lost some correction compared to initial postop imaging as there appears to be reduction in calcaneal inclination angle and  talar declination angle appears to be increased, forefoot abduction angle also appears to be increased compared to initial postoperative imaging. Joint still appear to be in stable position and improved compared to preop clinically. Noticed more bony consolidation across the navicular cuneiform joint, talonavicular joint, calcaneocuboid joint, and subtalar joints.   PATIENT SURVEYS:  FOTO 47/100 with target of 60   COGNITION:  Overall cognitive status: Within functional limits for tasks assessed                           SENSATION: WFL   EDEMA: Deferred, lipoma on lateral malleolus of right foot.    MUSCLE LENGTH: Hamstrings: Right 70 deg; Left 70 deg with restriction  Marcello Moores test: Right NT deg; Left NT  deg   POSTURE: rounded shoulders and forward head   PALPATION: Lipoma on lateral side of right foot along right malleolus     LOWER EXTREMITY ROM:  Active  Right 02/20/2022 Left 02/20/2022  Hip flexion 120 120  Hip extension 30 30  Hip abduction 45 45  Hip adduction 30 30  Hip internal rotation 45 45  Hip external rotation 45 45  Knee flexion 135 135  Knee extension 0 0  Ankle dorsiflexion 20 20  Ankle plantarflexion 15/20 20/20  Ankle inversion 20/25 35/35  Ankle eversion 10/15 15/15   (Blank rows = not tested)        LOWER EXTREMITY MMT:   MMT Right eval Left eval Right  Left  Hip flexion        Hip extension        Hip abduction     3+ 3+  Hip adduction     4- 4-  Hip internal rotation        Hip external rotation        Knee flexion 5 5    Knee extension 5 5    Ankle dorsiflexion 5 5    Ankle plantarflexion 2 (Long Sitting) NT  2  Ankle inversion 5 5    Ankle eversion 5 5     (Blank rows = not tested)     FUNCTIONAL TESTS:  6 minute walk test: 900 ft with use of rollator    GAIT: Distance walked: 50 ft  Assistive device utilized: Environmental consultant - 4 wheeled Level of assistance: Modified independence Comments: Pt donning surgical boot so gait assessment deferred        TODAY'S TREATMENT:  03/25/22:  THEREX   TM with BUE support at 1.0 mph for 5 mi  1,010 ft without AD around parking lot  -Supinated left foot with in toeing due to ankle deformities   Negotiate 1 foot curb without AD -min VC to step up with LLE and step down with RLE   NMR   Toe Taps onto 6 inch cone 3 x 10  -intermittent use of UE support     03/20/22:  THEREX   TM with BUE support at 1.5 mph for 5 min   NMR   10 m normal pace walk x 10  10 m vertical head turns x 8 10 m  horizontal head turns x 8  10 m fast and slow x 6   Modified single leg stance 6 x 30 sec on water bottle     03/13/22:  Gait Training   Ambulate outside in parking lot 1000 ft using SPC and right ankle brace  -min VC to keep right toes straight and avoid  ER  10 meters x 10 without [place] of SPC or right ankle brace  -min VC to maintain straight toes on right foot    THEREX   Standing Hip Abduction with BUE support 1 x 10  Standing Hip Abduction with 1 UE support 1 x 10  Standing Hip Abduction with 2 finger support 1 x 10   NMR   Semi-tandem with eyes open 5 x 10 sec  Semi-tandem with horizontal head turns 3 x 10  Semi-tandem with vertical head turns 3 x 10  Semi-tandem with eyes closed 5 x 10 sec   03/11/22:  THEREX   Seated Soleus Heel Raises #10 1 x 10  Seated Soleus Heel Raises #15 1 x 10 Seated Soleus Heel Raises #20 1 x 10   NMR   Toe Taps onto 4 inch step 3 x 10    03/06/22:  THEREX   Standing Heel Raises 1 x 10  Standing Heel Raises with BUE off 1 ft step 1 x 10  Standing Calf Stretch on LLE 3 x 30 sec   NMR  Semi-tandem R + L 5 x 10 sec  Semi-tandem R + L 3 x 10 head turns   Gait Training   10 m walk with use of SPC and swing through gait -min VC for increased step length on RLE    03/04/22:  There.Ex:   Seated foot slides into great toe extension: 2x20 with 2-3 sec holds  Seated ankle AROM :  R Gastroc stretch with towel: 3x30 sec holds  R Ankle ABC's: x2 capital letters  Seated ankle IN/EVER on slick surface for increased ROM 2x15 each direction Seated arch lifts for improved intrinsic musculature control 2x15 Seated towel scrunches with use of intrinsic toe muscles, 2x15   Manual:  Seated metatarsal fan mobilization, 30 sec bouts for each metatarsal, Grades II-III for pain modulation and improved joint ROM PROM of ankle joint in all planes of mobility for increased ROM and pain modulation with movement      PATIENT EDUCATION:   Education details: form and technique for appropriate exercise  Person educated: Patient Education method: Customer service manager Education comprehension: verbalized understanding, returned demonstration, and verbal cues required     HOME EXERCISE PROGRAM:  Access Code: [email or web address] URL: [email or web address] Date: 03/25/2022 Prepared by: Bradly Chris  Exercises - Standing Hip Abduction with Counter Support  - 1 x daily - 3 x weekly - 3 sets - 10 reps - Prone Quadriceps Stretch with Strap  - 1 x daily - 7 x weekly - 3 reps - 30-60 hold - Standing Bilateral Heel Raise on Step  - 1 x daily - 3 x weekly - 3 sets - 10 reps - Seated Heel Raise  - 3 x weekly - 3 sets - 10 reps - Half Tandem Stance Balance with Eyes Closed  - 1 x daily - 7 x weekly - 5 reps - 10 hold - Standing Toe Taps  - 1 x daily - 3 x weekly - 3 sets - 10 reps   ASSESSMENT:   CLINICAL IMPRESSION:  Pt shows an improvement in mobility with ability to walk outside over uneven surfaces without an AD. She continues to show decreased foot clearance on LLE during swing because of inversion of foot that is her normal anatomic position. Pt able to correct toe position with min VC resulting in increased left foot clearance. PT advised pt to attempt to ambulate outside without AD with husband present to provide  assistance if needed. She will continue benefit from skilled PT to increase her static and dynamic balance and to return to walking without an AD for improved mobility and to decrease her risk of falling.    OBJECTIVE IMPAIRMENTS Abnormal gait, decreased balance, decreased endurance, decreased mobility, difficulty walking, decreased ROM, decreased strength, hypomobility, and impaired flexibility.    ACTIVITY LIMITATIONS standing, squatting, stairs, and locomotion level   PARTICIPATION LIMITATIONS: cleaning, shopping, and community activity   PERSONAL FACTORS Age, Fitness, and 1 comorbidity: family  history of arthritis  are also affecting patient's functional outcome.    REHAB POTENTIAL: Good   CLINICAL DECISION MAKING: Stable/uncomplicated   EVALUATION COMPLEXITY: Low     GOALS: Goals reviewed with patient? No   SHORT TERM GOALS: Target date: 03/07/2022  Pt will be independent with HEP in order to improve strength and balance in order to decrease fall risk and improve function at home and work. Baseline: Performing independently  Goal status: Ongoing    2.  Patient will ambulate without donning surgical boot as evidenced of tissue healing and improvement in ankle stability.  Baseline: Wearing surgical boot  Goal status: Achieved    3.  Pt will increase 6MWT by at least 48m(1669f in order to demonstrate clinically significant improvement in cardiopulmonary endurance and community ambulation Baseline: 900 ft  Goal status: Ongoing        LONG TERM GOALS: Target date: 05/02/2022    Patient will have improved function and activity level as evidenced by an increase in FOTO score by 10 points or more.  Baseline: 47/100 with target of 60 Goal status: Ongoing    2.  Patient will improve RLE strength to be symmetrical with LLE for improved gait mechanics and weight bearing without use of AD.  Baseline: PF R/L 2, NT for hip strength  Goal status: Ongoing    3.  Patient will negotiate a flight of stairs as evidence of improved mobility and improved surgical healing  Baseline: Not performed  Goal status: Ongoing    4.  Patient will ambulate 1,000 ft during 19m93mas evidence that her left foot is healing and she is capable of ambulating community level distances.  Baseline: 900 ft  Goal status: Achieved        PLAN:  PT FREQUENCY: 1-2x/week   PT DURATION: 10 weeks   PLANNED INTERVENTIONS: Therapeutic exercises, Neuromuscular re-education, Balance training, Gait training, Patient/Family education, Self Care, Joint mobilization, Joint manipulation, Stair training, DME  instructions, Aquatic Therapy, Electrical stimulation, Cryotherapy, Moist heat, scar mobilization, Manual therapy, and Re-evaluation   PLAN FOR NEXT SESSION: Reassess goals. [place] exercises for LE strengthening and balance exercises      DanBradly Chris, DPT  03/25/2022, 2:05 PM

## 2022-03-28 ENCOUNTER — Ambulatory Visit: Payer: PPO | Admitting: Physical Therapy

## 2022-04-01 ENCOUNTER — Ambulatory Visit: Payer: PPO | Admitting: Physical Therapy

## 2022-04-01 ENCOUNTER — Encounter: Payer: Self-pay | Admitting: Physical Therapy

## 2022-04-01 DIAGNOSIS — M79671 Pain in right foot: Secondary | ICD-10-CM | POA: Diagnosis not present

## 2022-04-01 DIAGNOSIS — R262 Difficulty in walking, not elsewhere classified: Secondary | ICD-10-CM

## 2022-04-01 NOTE — Therapy (Addendum)
OUTPATIENT PHYSICAL THERAPY PROGRESS NOTE   Patient Name: ADILYN HUMES MRN: [id number] DOB:Aug 04, 1945, 76 y.o., female Today's Date: 04/01/2022  PCP: Dr. Shirline Frees  REFERRING PROVIDER: Dr. Shirline Frees   END OF SESSION:   PT End of Session - 04/01/22 1226     Visit Number 10    Number of Visits 20    Date for PT Re-Evaluation 05/01/22    Authorization Type [id number]    Authorization Time Period 02/20/22-05/01/22    Authorization - Visit Number 10    Authorization - Number of Visits 20    Progress Note Due on Visit 10    PT Start Time 1015    PT Stop Time 1100    PT Time Calculation (min) 45 min    Equipment Utilized During Treatment Gait belt    Activity Tolerance Patient tolerated treatment well    Behavior During Therapy WFL for tasks assessed/performed                  Past Medical History:  Diagnosis Date   Arthritis    Complication of anesthesia    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 Tiffany Kocher   History of 2019 novel coronavirus disease (COVID-19) 01/15/2021   Hypertension    Low back pain 04/1994   Menopause    Osteoarthritis    Restless leg syndrome 1999   Past Surgical History:  Procedure Laterality Date   ACHILLES TENDON SURGERY Right 11/16/2021   Procedure: PTAL - ACHILLES TENDON MICROTENOTOMY;  Surgeon: Caroline More, DPM;  Location: [facility];  Service: Podiatry;  Laterality: Right;   ARTHRODESIS METATARSAL Right 11/16/2021   Procedure: ARTHRODESIS METATARSAL, T-N, C-C OR MET-CUNNEIFORM, NCJ;  Surgeon: Caroline More, DPM;  Location: [facility];  Service: Podiatry;  Laterality: Right;   CATARACT EXTRACTION     CATARACT EXTRACTION W/PHACO Left 08/15/2020   Procedure: CATARACT EXTRACTION PHACO AND INTRAOCULAR LENS PLACEMENT (IOC) LEFT 6.60 00:39.8;  Surgeon: Birder Robson, MD;  Location: [place];  Service: Ophthalmology;  Laterality: Left;   CATARACT EXTRACTION W/PHACO Right  08/29/2020   Procedure: CATARACT EXTRACTION PHACO AND INTRAOCULAR LENS PLACEMENT (IOC) RIGHT 6.94 00:42.3;  Surgeon: Birder Robson, MD;  Location: [place];  Service: Ophthalmology;  Laterality: Right;   [place]   X2   COLONOSCOPY  12/06/2011   normal   COLONOSCOPY WITH PROPOFOL N/A 04/13/2018   Procedure: COLONOSCOPY WITH PROPOFOL;  Surgeon: Manya Silvas, MD;  Location: [facility];  Service: Endoscopy;  Laterality: N/A;   ESOPHAGOGASTRODUODENOSCOPY (EGD) WITH PROPOFOL N/A 02/23/2015   Procedure: ESOPHAGOGASTRODUODENOSCOPY (EGD) WITH PROPOFOL;  Surgeon: Manya Silvas, MD;  Location: [facility];  Service: Endoscopy;  Laterality: N/A;   ESOPHAGOGASTRODUODENOSCOPY (EGD) WITH PROPOFOL N/A 04/13/2018   Procedure: ESOPHAGOGASTRODUODENOSCOPY (EGD) WITH PROPOFOL;  Surgeon: Manya Silvas, MD;  Location: [facility];  Service: Endoscopy;  Laterality: N/A;   FOOT ARTHRODESIS Right 11/16/2021   Procedure: ARTHRODESIS FOOT; TRIPLE;  Surgeon: Caroline More, DPM;  Location: [facility];  Service: Podiatry;  Laterality: Right;   FOOT SURGERY     with screws   HARDWARE REMOVAL Right 11/16/2021   Procedure: HARDWARE REMOVAL;  Surgeon: Caroline More, DPM;  Location: [facility];  Service: Podiatry;  Laterality: Right;   HIP CLOSED REDUCTION Left 08/08/2015   Procedure: CLOSED REDUCTION HIP;  Surgeon: Dereck Leep, MD;  Location: [facility];  Service: Orthopedics;  Laterality: Left;  HIP CLOSED REDUCTION Left 09/18/2015   Procedure: CLOSED REDUCTION HIP;  Surgeon: Hessie Knows, MD;  Location: [facility];  Service: Orthopedics;  Laterality: Left;   HIP CLOSED REDUCTION Left 12/01/2015   Procedure: CLOSED REDUCTION HIP;  Surgeon: Dereck Leep, MD;  Location: [facility];  Service: Orthopedics;  Laterality: Left;  no prep no incision   JOINT REPLACEMENT  10/2016   left knee   KNEE ARTHROPLASTY Left 10/28/2016   Procedure: COMPUTER ASSISTED TOTAL KNEE ARTHROPLASTY;   Surgeon: Dereck Leep, MD;  Location: [facility];  Service: Orthopedics;  Laterality: Left;   KNEE ARTHROPLASTY Right 07/07/2017   Procedure: COMPUTER ASSISTED TOTAL KNEE ARTHROPLASTY;  Surgeon: Dereck Leep, MD;  Location: [facility];  Service: Orthopedics;  Laterality: Right;   KNEE ARTHROSCOPY Right 11/14/2014   Procedure: ARTHROSCOPY KNEE;  Surgeon: Dereck Leep, MD;  Location: [facility];  Service: Orthopedics;  Laterality: Right;  Posterior horn menicus, anterior lateral menicus grade 3 chondromalacia   phlebitis groin     right foot surgery     SHOULDER ARTHROSCOPY WITH OPEN ROTATOR CUFF REPAIR Left 03/10/2019   Procedure: LEFT SHOULDER ARTHROSCOPY, ARTHROSCOPIC ROTATOR CUFF REPAIR, DISTAL CLAVICLE EXCISION, SUBACROMIAL DECOMPRESSION, INTRA-ARTICULAR DEBRIDEMENT ;  Surgeon: Lovell Sheehan, MD;  Location: [facility];  Service: Orthopedics;  Laterality: Left;   SHOULDER ARTHROSCOPY WITH ROTATOR CUFF REPAIR Left 06/21/2019   Procedure: SHOULDER ARTHROSCOPY WITH EXTENSIVE INTRAARTICULAR DEBRIDEMENT WITH REMOVAL OF HARDWARE;  Surgeon: Lovell Sheehan, MD;  Location: [place];  Service: Orthopedics;  Laterality: Left;   TONSILLECTOMY     TOTAL HIP ARTHROPLASTY Left 05/08/2011   necrosis of hip   TOTAL HIP ARTHROPLASTY Right 06/14/2015   Procedure: TOTAL HIP ARTHROPLASTY;  Surgeon: Dereck Leep, MD;  Location: [facility];  Service: Orthopedics;  Laterality: Right;   TOTAL HIP REVISION Left 02/21/2016   Procedure: TOTAL HIP REVISION;  Surgeon: Dereck Leep, MD;  Location: [facility];  Service: Orthopedics;  Laterality: Left;   Patient Active Problem List   Diagnosis Date Noted   Pain, postoperative, acute 11/16/2021   Generalized osteoarthritis of multiple sites 07/28/2018   Varicose veins of both legs with edema 07/28/2018   FH: colon polyps 02/10/2018   S/P total knee arthroplasty 07/07/2017   Status post total left knee replacement 10/28/2016   Failed total hip arthroplasty  with dislocation ([place]) 12/01/2015   S/P closed reduction of dislocated total hip prosthesis 08/08/2015   S/P total hip arthroplasty 06/14/2015   Benign essential HTN 12/28/2013   Acid reflux 12/28/2013   HLD (hyperlipidemia) 12/28/2013   Restless leg syndrome 12/28/2013   Depression 1999    REFERRING DIAG:  S/p R achiless tendon lengthening and right ankle arthrodesis.     THERAPY DIAG:  Pain in right foot  Difficulty in walking, not elsewhere classified  Rationale for Evaluation and Treatment Rehabilitation  PERTINENT HISTORY: Pt reports s/p 12 weeks after undergoing right Achilles tendon lengthening, triple arthrodesis, naviculocuneiform jont fusion. She has some post surgical issues with screws breaking and a wound infection. However, despite the issues foot is healing well as evidenced by most recent imaging. She has a long history of ostearthritis that runs in her family. 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   PRECAUTIONS: WBAT  SUBJECTIVE: Pt states that she did a lot of walking over the weekend and she feels increased soreness in right foot. She stopped using brace over weekend because of the  pain and her ankle felt less stable.    PAIN:  Are you having pain? Yes: NPRS scale: 2-3/10 Pain location: Top surface of right foot  Pain description: Achy Aggravating factors: Walking on foot  Relieving factors: Keeping weight off foot    OBJECTIVE: (objective measures completed at initial evaluation unless otherwise dated)  VITALS: BP 128/70 HR 76 SpO2 100   DIAGNOSTIC FINDINGS: CT scan 01/30/22: CLINICAL DATA: Achilles tendon lengthening. Triple arthrodesis. Ten week follow-up evaluation.  EXAM: CT OF THE RIGHT FOOT WITHOUT CONTRAST  CT OF THE RIGHT ANKLE WITHOUT CONTRAST  TECHNIQUE: Multidetector CT imaging of the right foot was performed according to the standard protocol. Multiplanar CT image reconstructions were also  generated.  Multidetector CT imaging of the right ankle was performed according to the standard protocol. Multiplanar CT image reconstructions were also generated.  RADIATION DOSE REDUCTION: This exam was performed according to the departmental dose-optimization program which includes automated exposure control, adjustment of the mA and/or kV according to patient size and/or use of iterative reconstruction technique.  COMPARISON: None Available.  FINDINGS: Bones/Joint/Cartilage  No acute fracture or dislocation. Subtalar arthrodesis transfixed with 2 cannulated screws with developing bone bridging across the posterior subtalar joint. Talonavicular arthrodesis transfixed with dorsal medial sideplate and multiple interlocking screws with severe joint space narrowing and a small area possible bone bridging along the medial aspect. Arthrodesis of the navicular-medial cuneiform with osseous bridging across the joint space. Calcaneocuboid arthrodesis with a small area of bone bridging along the mid aspect. No hardware failure or complication.  Osseous fusion of the second TMT joint. Moderate osteoarthritis of the navicular-middle cuneiform. Normal alignment. Small ankle joint effusion. Small plantar calcaneal spur.  Ligaments  Ligaments are suboptimally evaluated by CT.  Muscles and Tendons Muscles are normal. No muscle atrophy. No intramuscular fluid collection or hematoma. Flexor and extensor compartment tendons are intact. Peroneal tendons are intact. Mild thickening of the Achilles tendon likely reflecting postsurgical changes. Punctate calcifications in the medial band of the plantar fascia likely reflecting sequela prior plantar fasciitis.  Soft tissue No fluid collection or hematoma. No soft tissue mass. Soft tissue swelling along the dorsal aspect of the foot.  IMPRESSION: 1. Subtalar arthrodesis transfixed with 2 cannulated screws with developing bone bridging  across the posterior subtalar joint. 2. Talonavicular arthrodesis transfixed with dorsal medial sideplate and multiple interlocking screws with severe joint space narrowing and a small area possible bone bridging along the medial aspect. 3. Arthrodesis of the navicular-medial cuneiform with osseous bridging across the joint space. Calcaneocuboid arthrodesis with a small area of bone bridging along the mid aspect. 4. Osseous fusion of the second TMT joint. 5. Moderate osteoarthritis of the navicular-middle cuneiform.   Electronically Signed By: Kathreen Devoid M.D. On: 02/01/2022 07:17   X-rays: Right foot 3 views AP, lateral, lateral oblique: Status post triple arthrodesis with naviculocuneiform joint fusion. Medial column plate appears to be intact with screws the appropriate size and length. Screws appear the area to be the appropriate size and length at the subtalar joint level and staples appear to be providing compression across the calcaneocuboid joint. Unfortunately patient does appear to have some hardware broken, screw is broken at the talonavicular joint and one of the staples is broken at the calcaneocuboid joint. Foot appears to be in better alignment compared to preop but appears to have lost some correction compared to initial postop imaging as there appears to be reduction in calcaneal inclination angle and talar declination angle appears to be  increased, forefoot abduction angle also appears to be increased compared to initial postoperative imaging. Joint still appear to be in stable position and improved compared to preop clinically. Noticed more bony consolidation across the navicular cuneiform joint, talonavicular joint, calcaneocuboid joint, and subtalar joints.   PATIENT SURVEYS:  FOTO 47/100 with target of 60   COGNITION:  Overall cognitive status: Within functional limits for tasks assessed                          SENSATION: WFL   EDEMA: Deferred, lipoma on lateral  malleolus of right foot.    MUSCLE LENGTH: Hamstrings: Right 70 deg; Left 70 deg with restriction  Marcello Moores test: Right NT deg; Left NT  deg   POSTURE: rounded shoulders and forward head   PALPATION: Lipoma on lateral side of right foot along right malleolus     LOWER EXTREMITY ROM:  Active  Right 02/20/2022 Left 02/20/2022  Hip flexion 120 120  Hip extension 30 30  Hip abduction 45 45  Hip adduction 30 30  Hip internal rotation 45 45  Hip external rotation 45 45  Knee flexion 135 135  Knee extension 0 0  Ankle dorsiflexion 20 20  Ankle plantarflexion 15/20 20/20  Ankle inversion 20/25 35/35  Ankle eversion 10/15 15/15   (Blank rows = not tested)        LOWER EXTREMITY MMT:   MMT Right eval Left eval Right  04/01/22 Left 04/01/22  Hip flexion        Hip extension        Hip abduction  3+  3+    Hip adduction  4-  4-    Hip internal rotation        Hip external rotation        Knee flexion 5 5    Knee extension 5 5    Ankle dorsiflexion 5 5    Ankle plantarflexion 2 (Long Sitting) NT 5 5  Ankle inversion 5 5    Ankle eversion 5 5     (Blank rows = not tested)     FUNCTIONAL TESTS:  6 minute walk test: 900 ft with use of rollator    GAIT: Distance walked: 50 ft  Assistive device utilized: Environmental consultant - 4 wheeled Level of assistance: Modified independence Comments: Pt donning surgical boot so gait assessment deferred        TODAY'S TREATMENT:  04/01/22  78mT: 850 ft without use of SPC  FOTO: 49/100 Ankle MMT: PF R/L 5/5  Stairs: Step to, with use of railings  Modified Single Leg Stance with use of yoga block 3 x 60 sec  Hip Abduction with two finger 2 x 10  Hip Abduction with two finger #3 AW 3 x 10   03/25/22:  THEREX   TM with BUE support at 1.0 mph for 5 mi  1,010 ft without AD around parking lot  -Supinated left foot with in toeing due to ankle deformities   Negotiate 1 foot curb without AD -min VC to step up with LLE and step down with  RLE   NMR   Toe Taps onto 6 inch cone 3 x 10  -intermittent use of UE support     03/20/22:  THEREX   TM with BUE support at 1.5 mph for 5 min   NMR   10 m normal pace walk x 10  10 m vertical head turns x 8 10 m horizontal head  turns x 8  10 m fast and slow x 6   Modified single leg stance 6 x 30 sec on water bottle     03/13/22:  Gait Training   Ambulate outside in parking lot 1000 ft using SPC and right ankle brace  -min VC to keep right toes straight and avoid ER  10 meters x 10 without [place] of SPC or right ankle brace  -min VC to maintain straight toes on right foot    THEREX   Standing Hip Abduction with BUE support 1 x 10  Standing Hip Abduction with 1 UE support 1 x 10  Standing Hip Abduction with 2 finger support 1 x 10   NMR   Semi-tandem with eyes open 5 x 10 sec  Semi-tandem with horizontal head turns 3 x 10  Semi-tandem with vertical head turns 3 x 10  Semi-tandem with eyes closed 5 x 10 sec   03/11/22:  THEREX   Seated Soleus Heel Raises #10 1 x 10  Seated Soleus Heel Raises #15 1 x 10 Seated Soleus Heel Raises #20 1 x 10   NMR   Toe Taps onto 4 inch step 3 x 10    03/06/22:  THEREX   Standing Heel Raises 1 x 10  Standing Heel Raises with BUE off 1 ft step 1 x 10  Standing Calf Stretch on LLE 3 x 30 sec   NMR  Semi-tandem R + L 5 x 10 sec  Semi-tandem R + L 3 x 10 head turns   Gait Training   10 m walk with use of SPC and swing through gait -min VC for increased step length on RLE     PATIENT EDUCATION:  Education details: form and technique for appropriate exercise  Person educated: Patient Education method: Customer service manager Education comprehension: verbalized understanding, returned demonstration, and verbal cues required     HOME EXERCISE PROGRAM:  Access Code: [email or web address] URL: [email or web address] Date: 03/25/2022 Prepared by: Bradly Chris  Exercises - Standing Hip Abduction with  Counter Support  - 1 x daily - 3 x weekly - 3 sets - 10 reps - Prone Quadriceps Stretch with Strap  - 1 x daily - 7 x weekly - 3 reps - 30-60 hold - Standing Bilateral Heel Raise on Step  - 1 x daily - 3 x weekly - 3 sets - 10 reps - Seated Heel Raise  - 3 x weekly - 3 sets - 10 reps - Half Tandem Stance Balance with Eyes Closed  - 1 x daily - 7 x weekly - 5 reps - 10 hold - Standing Toe Taps  - 1 x daily - 3 x weekly - 3 sets - 10 reps -Modified Single Leg Stance on yoga block 3 x 30 sec x 7 days per week    ASSESSMENT:   CLINICAL IMPRESSION:  Pt presents to 10th appointment for recovery from s/p trimalleoler athrodesis on right foot. She shows improvement with aerobic endurance and ambulation status with ability to complete 6 mWT without use of AD. She also shows improvement in her right foot strength and ability to negotiate stairs. She still has ongoing aerobic endurance deficits along with decreased static dynamic balance intermittently relying on UE support with use of SPC. She will continue benefit from skilled PT to increase her static and dynamic balance and to return to walking without an AD for improved mobility and to decrease her risk of falling.    OBJECTIVE IMPAIRMENTS Abnormal gait, decreased  balance, decreased endurance, decreased mobility, difficulty walking, decreased ROM, decreased strength, hypomobility, and impaired flexibility.    ACTIVITY LIMITATIONS standing, squatting, stairs, and locomotion level   PARTICIPATION LIMITATIONS: cleaning, shopping, and community activity   PERSONAL FACTORS Age, Fitness, and 1 comorbidity: family history of arthritis  are also affecting patient's functional outcome.    REHAB POTENTIAL: Good   CLINICAL DECISION MAKING: Stable/uncomplicated   EVALUATION COMPLEXITY: Low     GOALS: Goals reviewed with patient? No   SHORT TERM GOALS: Target date: 03/07/2022  Pt will be independent with HEP in order to improve strength and balance in  order to decrease fall risk and improve function at home and work. Baseline: Performing independently  Goal status: Ongoing    2.  Patient will ambulate without donning surgical boot as evidenced of tissue healing and improvement in ankle stability.  Baseline: Wearing surgical boot  Goal status: Achieved    3.  Pt will increase 6MWT by at least 53m(1655f in order to demonstrate clinically significant improvement in cardiopulmonary endurance and community ambulation Baseline: 900 ft  Goal status: Ongoing        LONG TERM GOALS: Target date: 05/02/2022    Patient will have improved function and activity level as evidenced by an increase in FOTO score by 10 points or more.  Baseline: 47/100 with target of 60  04/01/22: 49  Goal status: Ongoing   2.  Patient will improve RLE strength to be symmetrical with LLE for improved gait mechanics and weight bearing without use of AD.  Baseline: PF R/L 2, NT for hip strength   04/01/22: PF R/L 5/5 Goal status: Achieved    3.  Patient will negotiate a flight of stairs as evidence of improved mobility and improved surgical healing  Baseline: NT    04/01/22: Able to negotiate stairs with use of UE support and step to pattern  Goal status: Achieved    4.  Patient will ambulate 1,000 ft during 53m5mas evidence that her left foot is healing and she is capable of ambulating community level distances.  Baseline: 900 ft 04/01/22: 850 ft  Goal status: Ongoing        PLAN:  PT FREQUENCY: 1-2x/week   PT DURATION: 10 weeks   PLANNED INTERVENTIONS: Therapeutic exercises, Neuromuscular re-education, Balance training, Gait training, Patient/Family education, Self Care, Joint mobilization, Joint manipulation, Stair training, DME instructions, Aquatic Therapy, Electrical stimulation, Cryotherapy, Moist heat, scar mobilization, Manual therapy, and Re-evaluation   PLAN FOR NEXT SESSION: [place] exercises for LE strengthening and balance exercises and  progress walking without AD and dynamic balance exercises      DanBradly Chris, DPT  04/01/2022, 1:26 PM

## 2022-04-03 ENCOUNTER — Encounter: Payer: Self-pay | Admitting: Physical Therapy

## 2022-04-03 ENCOUNTER — Ambulatory Visit: Payer: PPO | Admitting: Physical Therapy

## 2022-04-03 DIAGNOSIS — M79671 Pain in right foot: Secondary | ICD-10-CM | POA: Diagnosis not present

## 2022-04-03 DIAGNOSIS — R262 Difficulty in walking, not elsewhere classified: Secondary | ICD-10-CM

## 2022-04-03 NOTE — Therapy (Signed)
OUTPATIENT PHYSICAL THERAPY PROGRESS NOTE   Patient Name: Claudia Terry MRN: [id number] DOB:1946-04-20, 76 y.o., female Today's Date: 04/03/2022  PCP: Dr. Shirline Frees  REFERRING PROVIDER: Dr. Shirline Frees   END OF SESSION:   PT End of Session - 04/03/22 1144     Visit Number 11    Number of Visits 20    Date for PT Re-Evaluation 05/01/22    Authorization Type [id number]    Authorization Time Period 02/20/22-05/01/22    Authorization - Visit Number 11    Authorization - Number of Visits 20    Progress Note Due on Visit 20    PT Start Time 8416    PT Stop Time 1230    PT Time Calculation (min) 45 min    Equipment Utilized During Treatment Gait belt    Activity Tolerance Patient tolerated treatment well    Behavior During Therapy WFL for tasks assessed/performed                  Past Medical History:  Diagnosis Date   Arthritis    Complication of anesthesia    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 Tiffany Kocher   History of 2019 novel coronavirus disease (COVID-19) 01/15/2021   Hypertension    Low back pain 04/1994   Menopause    Osteoarthritis    Restless leg syndrome 1999   Past Surgical History:  Procedure Laterality Date   ACHILLES TENDON SURGERY Right 11/16/2021   Procedure: PTAL - ACHILLES TENDON MICROTENOTOMY;  Surgeon: Caroline More, DPM;  Location: [facility];  Service: Podiatry;  Laterality: Right;   ARTHRODESIS METATARSAL Right 11/16/2021   Procedure: ARTHRODESIS METATARSAL, T-N, C-C OR MET-CUNNEIFORM, NCJ;  Surgeon: Caroline More, DPM;  Location: [facility];  Service: Podiatry;  Laterality: Right;   CATARACT EXTRACTION     CATARACT EXTRACTION W/PHACO Left 08/15/2020   Procedure: CATARACT EXTRACTION PHACO AND INTRAOCULAR LENS PLACEMENT (IOC) LEFT 6.60 00:39.8;  Surgeon: Birder Robson, MD;  Location: [place];  Service: Ophthalmology;  Laterality: Left;   CATARACT EXTRACTION W/PHACO Right  08/29/2020   Procedure: CATARACT EXTRACTION PHACO AND INTRAOCULAR LENS PLACEMENT (IOC) RIGHT 6.94 00:42.3;  Surgeon: Birder Robson, MD;  Location: [place];  Service: Ophthalmology;  Laterality: Right;   [place]   X2   COLONOSCOPY  12/06/2011   normal   COLONOSCOPY WITH PROPOFOL N/A 04/13/2018   Procedure: COLONOSCOPY WITH PROPOFOL;  Surgeon: Manya Silvas, MD;  Location[facility];  Service: Endoscopy;  Laterality: N/A;   ESOPHAGOGASTRODUODENOSCOPY (EGD) WITH PROPOFOL N/A 02/23/2015   Procedure: ESOPHAGOGASTRODUODENOSCOPY (EGD) WITH PROPOFOL;  Surgeon: Manya Silvas, MD;  Location: [facility];  Service: Endoscopy;  Laterality: N/A;   ESOPHAGOGASTRODUODENOSCOPY (EGD) WITH PROPOFOL N/A 04/13/2018   Procedure: ESOPHAGOGASTRODUODENOSCOPY (EGD) WITH PROPOFOL;  Surgeon: Manya Silvas, MD;  Location: [facility] ENDOSCOPY;  Service: Endoscopy;  Laterality: N/A;   FOOT ARTHRODESIS Right 11/16/2021   Procedure: ARTHRODESIS FOOT; TRIPLE;  Surgeon: Caroline More, DPM;  Location: [facility];  Service: Podiatry;  Laterality: Right;   FOOT SURGERY     with screws   HARDWARE REMOVAL Right 11/16/2021   Procedure: HARDWARE REMOVAL;  Surgeon: Caroline More, DPM;  Location: [facility];  Service: Podiatry;  Laterality: Right;   HIP CLOSED REDUCTION Left 08/08/2015   Procedure: CLOSED REDUCTION HIP;  Surgeon: Dereck Leep, MD;  Location: [facility];  Service: Orthopedics;  Laterality: Left;  HIP CLOSED REDUCTION Left 09/18/2015   Procedure: CLOSED REDUCTION HIP;  Surgeon: Hessie Knows, MD;  Location: [facility];  Service: Orthopedics;  Laterality: Left;   HIP CLOSED REDUCTION Left 12/01/2015   Procedure: CLOSED REDUCTION HIP;  Surgeon: Dereck Leep, MD;  Location: [facility];  Service: Orthopedics;  Laterality: Left;  no prep no incision   JOINT REPLACEMENT  10/2016   left knee   KNEE ARTHROPLASTY Left 10/28/2016   Procedure: COMPUTER ASSISTED TOTAL KNEE ARTHROPLASTY;   Surgeon: Dereck Leep, MD;  Location: [facility];  Service: Orthopedics;  Laterality: Left;   KNEE ARTHROPLASTY Right 07/07/2017   Procedure: COMPUTER ASSISTED TOTAL KNEE ARTHROPLASTY;  Surgeon: Dereck Leep, MD;  Location: [facility];  Service: Orthopedics;  Laterality: Right;   KNEE ARTHROSCOPY Right 11/14/2014   Procedure: ARTHROSCOPY KNEE;  Surgeon: Dereck Leep, MD;  Location: [facility];  Service: Orthopedics;  Laterality: Right;  Posterior horn menicus, anterior lateral menicus grade 3 chondromalacia   phlebitis groin     right foot surgery     SHOULDER ARTHROSCOPY WITH OPEN ROTATOR CUFF REPAIR Left 03/10/2019   Procedure: LEFT SHOULDER ARTHROSCOPY, ARTHROSCOPIC ROTATOR CUFF REPAIR, DISTAL CLAVICLE EXCISION, SUBACROMIAL DECOMPRESSION, INTRA-ARTICULAR DEBRIDEMENT ;  Surgeon: Lovell Sheehan, MD;  Location: [facility];  Service: Orthopedics;  Laterality: Left;   SHOULDER ARTHROSCOPY WITH ROTATOR CUFF REPAIR Left 06/21/2019   Procedure: SHOULDER ARTHROSCOPY WITH EXTENSIVE INTRAARTICULAR DEBRIDEMENT WITH REMOVAL OF HARDWARE;  Surgeon: Lovell Sheehan, MD;  Location: [place];  Service: Orthopedics;  Laterality: Left;   TONSILLECTOMY     TOTAL HIP ARTHROPLASTY Left 05/08/2011   necrosis of hip   TOTAL HIP ARTHROPLASTY Right 06/14/2015   Procedure: TOTAL HIP ARTHROPLASTY;  Surgeon: Dereck Leep, MD;  Location: [facility];  Service: Orthopedics;  Laterality: Right;   TOTAL HIP REVISION Left 02/21/2016   Procedure: TOTAL HIP REVISION;  Surgeon: Dereck Leep, MD;  Location: [facility];  Service: Orthopedics;  Laterality: Left;   Patient Active Problem List   Diagnosis Date Noted   Pain, postoperative, acute 11/16/2021   Generalized osteoarthritis of multiple sites 07/28/2018   Varicose veins of both legs with edema 07/28/2018   FH: colon polyps 02/10/2018   S/P total knee arthroplasty 07/07/2017   Status post total left knee replacement 10/28/2016   Failed total hip arthroplasty  with dislocation ([place]) 12/01/2015   S/P closed reduction of dislocated total hip prosthesis 08/08/2015   S/P total hip arthroplasty 06/14/2015   Benign essential HTN 12/28/2013   Acid reflux 12/28/2013   HLD (hyperlipidemia) 12/28/2013   Restless leg syndrome 12/28/2013   Depression 1999    REFERRING DIAG:  S/p R achiless tendon lengthening and right ankle arthrodesis.     THERAPY DIAG:  Pain in right foot  Difficulty in walking, not elsewhere classified  Rationale for Evaluation and Treatment Rehabilitation  PERTINENT HISTORY: Pt reports s/p 12 weeks after undergoing right Achilles tendon lengthening, triple arthrodesis, naviculocuneiform jont fusion. She has some post surgical issues with screws breaking and a wound infection. However, despite the issues foot is healing well as evidenced by most recent imaging. She has a long history of ostearthritis that runs in her family. 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   PRECAUTIONS: WBAT  SUBJECTIVE: Pt reports groin pain on left leg from doing hip abduction exercise. She stopped exercises yesterday to give herself a break.    PAIN:  Are you having  pain? Yes: NPRS scale: 2-3/10 Pain location: Top surface of right foot  Pain description: Achy Aggravating factors: Walking on foot  Relieving factors: Keeping weight off foot    OBJECTIVE: (objective measures completed at initial evaluation unless otherwise dated)  VITALS: BP 128/70 HR 76 SpO2 100   DIAGNOSTIC FINDINGS: CT scan 01/30/22: CLINICAL DATA: Achilles tendon lengthening. Triple arthrodesis. Ten week follow-up evaluation.  EXAM: CT OF THE RIGHT FOOT WITHOUT CONTRAST  CT OF THE RIGHT ANKLE WITHOUT CONTRAST  TECHNIQUE: Multidetector CT imaging of the right foot was performed according to the standard protocol. Multiplanar CT image reconstructions were also generated.  Multidetector CT imaging of the right ankle was performed according to  the standard protocol. Multiplanar CT image reconstructions were also generated.  RADIATION DOSE REDUCTION: This exam was performed according to the departmental dose-optimization program which includes automated exposure control, adjustment of the mA and/or kV according to patient size and/or use of iterative reconstruction technique.  COMPARISON: None Available.  FINDINGS: Bones/Joint/Cartilage  No acute fracture or dislocation. Subtalar arthrodesis transfixed with 2 cannulated screws with developing bone bridging across the posterior subtalar joint. Talonavicular arthrodesis transfixed with dorsal medial sideplate and multiple interlocking screws with severe joint space narrowing and a small area possible bone bridging along the medial aspect. Arthrodesis of the navicular-medial cuneiform with osseous bridging across the joint space. Calcaneocuboid arthrodesis with a small area of bone bridging along the mid aspect. No hardware failure or complication.  Osseous fusion of the second TMT joint. Moderate osteoarthritis of the navicular-middle cuneiform. Normal alignment. Small ankle joint effusion. Small plantar calcaneal spur.  Ligaments  Ligaments are suboptimally evaluated by CT.  Muscles and Tendons Muscles are normal. No muscle atrophy. No intramuscular fluid collection or hematoma. Flexor and extensor compartment tendons are intact. Peroneal tendons are intact. Mild thickening of the Achilles tendon likely reflecting postsurgical changes. Punctate calcifications in the medial band of the plantar fascia likely reflecting sequela prior plantar fasciitis.  Soft tissue No fluid collection or hematoma. No soft tissue mass. Soft tissue swelling along the dorsal aspect of the foot.  IMPRESSION: 1. Subtalar arthrodesis transfixed with 2 cannulated screws with developing bone bridging across the posterior subtalar joint. 2. Talonavicular arthrodesis transfixed with dorsal  medial sideplate and multiple interlocking screws with severe joint space narrowing and a small area possible bone bridging along the medial aspect. 3. Arthrodesis of the navicular-medial cuneiform with osseous bridging across the joint space. Calcaneocuboid arthrodesis with a small area of bone bridging along the mid aspect. 4. Osseous fusion of the second TMT joint. 5. Moderate osteoarthritis of the navicular-middle cuneiform.   Electronically Signed By: Kathreen Devoid M.D. On: 02/01/2022 07:17   X-rays: Right foot 3 views AP, lateral, lateral oblique: Status post triple arthrodesis with naviculocuneiform joint fusion. Medial column plate appears to be intact with screws the appropriate size and length. Screws appear the area to be the appropriate size and length at the subtalar joint level and staples appear to be providing compression across the calcaneocuboid joint. Unfortunately patient does appear to have some hardware broken, screw is broken at the talonavicular joint and one of the staples is broken at the calcaneocuboid joint. Foot appears to be in better alignment compared to preop but appears to have lost some correction compared to initial postop imaging as there appears to be reduction in calcaneal inclination angle and talar declination angle appears to be increased, forefoot abduction angle also appears to be increased compared to initial postoperative imaging. Joint  still appear to be in stable position and improved compared to preop clinically. Noticed more bony consolidation across the navicular cuneiform joint, talonavicular joint, calcaneocuboid joint, and subtalar joints.   PATIENT SURVEYS:  FOTO 47/100 with target of 60   COGNITION:  Overall cognitive status: Within functional limits for tasks assessed                          SENSATION: WFL   EDEMA: Deferred, lipoma on lateral malleolus of right foot.    MUSCLE LENGTH: Hamstrings: Right 70 deg; Left 70 deg with  restriction  Marcello Moores test: Right NT deg; Left NT  deg   POSTURE: rounded shoulders and forward head   PALPATION: Lipoma on lateral side of right foot along right malleolus     LOWER EXTREMITY ROM:  Active  Right 02/20/2022 Left 02/20/2022  Hip flexion 120 120  Hip extension 30 30  Hip abduction 45 45  Hip adduction 30 30  Hip internal rotation 45 45  Hip external rotation 45 45  Knee flexion 135 135  Knee extension 0 0  Ankle dorsiflexion 20 20  Ankle plantarflexion 15/20 20/20  Ankle inversion 20/25 35/35  Ankle eversion 10/15 15/15   (Blank rows = not tested)        LOWER EXTREMITY MMT:   MMT Right eval Left eval Right  04/01/22 Left 04/01/22  Hip flexion        Hip extension        Hip abduction  3+  3+    Hip adduction  4-  4-    Hip internal rotation        Hip external rotation        Knee flexion 5 5    Knee extension 5 5    Ankle dorsiflexion 5 5    Ankle plantarflexion 2 (Long Sitting) NT 5 5  Ankle inversion 5 5    Ankle eversion 5 5     (Blank rows = not tested)     FUNCTIONAL TESTS:  6 minute walk test: 900 ft with use of rollator    GAIT: Distance walked: 50 ft  Assistive device utilized: Environmental consultant - 4 wheeled Level of assistance: Modified independence Comments: Pt donning surgical boot so gait assessment deferred        TODAY'S TREATMENT:  04/03/22  THEREX   TM at 1.5 mph with BUE support for 5 min  Standing right groin stretch 3 x 30 sec    NMR  3 cone weave spaced 15 ft apart x 20 with pt allowed to look down at ground  3 cone weave spaced 15 ft apart x 20 with pt instructed to look up  3 obstacle step over for 10 m x 10  6 obstacle step over for 10 m x 10  -min VC to lead with LLE  Backwards walk 10 m x 6  -Pt reports increased relief in left groin after exercise  Sideways walk 10 m x 6 to right and to left  Tandem walk 10 m x 6 with 1 UE support  -pt shows difficulty with left foot placement even with mod VC for sequence    04/01/22  48mT: 850 ft without use of SPC  FOTO: 49/100 Ankle MMT: PF R/L 5/5  Stairs: Step to, with use of railings  Modified Single Leg Stance with use of yoga block 3 x 60 sec  Hip Abduction with two finger 2 x 10  Hip Abduction with two finger #3 AW 3 x 10   03/25/22:  THEREX   TM with BUE support at 1.0 mph for 5 mi  1,010 ft without AD around parking lot  -Supinated left foot with in toeing due to ankle deformities   Negotiate 1 foot curb without AD -min VC to step up with LLE and step down with RLE   NMR   Toe Taps onto 6 inch cone 3 x 10  -intermittent use of UE support     03/20/22:  THEREX   TM with BUE support at 1.5 mph for 5 min   NMR   10 m normal pace walk x 10  10 m vertical head turns x 8 10 m horizontal head turns x 8  10 m fast and slow x 6   Modified single leg stance 6 x 30 sec on water bottle     03/13/22:  Gait Training   Ambulate outside in parking lot 1000 ft using SPC and right ankle brace  -min VC to keep right toes straight and avoid ER  10 meters x 10 without [place] of SPC or right ankle brace  -min VC to maintain straight toes on right foot    THEREX   Standing Hip Abduction with BUE support 1 x 10  Standing Hip Abduction with 1 UE support 1 x 10  Standing Hip Abduction with 2 finger support 1 x 10   NMR   Semi-tandem with eyes open 5 x 10 sec  Semi-tandem with horizontal head turns 3 x 10  Semi-tandem with vertical head turns 3 x 10  Semi-tandem with eyes closed 5 x 10 sec    PATIENT EDUCATION:  Education details: form and technique for appropriate exercise  Person educated: Patient Education method: Customer service manager Education comprehension: verbalized understanding, returned demonstration, and verbal cues required     HOME EXERCISE PROGRAM:  Access Code: [email or web address] URL: [email or web address] Date: 03/25/2022 Prepared by: Bradly Chris  Exercises - Standing Hip Abduction with  Counter Support  - 1 x daily - 3 x weekly - 3 sets - 10 reps - Prone Quadriceps Stretch with Strap  - 1 x daily - 7 x weekly - 3 reps - 30-60 hold - Standing Bilateral Heel Raise on Step  - 1 x daily - 3 x weekly - 3 sets - 10 reps - Seated Heel Raise  - 3 x weekly - 3 sets - 10 reps - Half Tandem Stance Balance with Eyes Closed  - 1 x daily - 7 x weekly - 5 reps - 10 hold - Standing Toe Taps  - 1 x daily - 3 x weekly - 3 sets - 10 reps -Modified Single Leg Stance on yoga block 3 x 30 sec x 7 days per week  -Standing Hip Adduction Stretch 3 x 30 sec x 7 days per week    ASSESSMENT:   CLINICAL IMPRESSION:  Pt shows an improvement in dynamic balance with ability to perform obstacle step overs and backwards walking without lose of balance. Her left groin pain resolved during session and it was likely the result of left hip flexor as evidenced by improved symptoms with walking backwards. She did show difficulty with tandem walk due to left foot placement which is likely due to inversion of ankle. She will continue benefit from skilled PT to increase her static and dynamic balance and to return to walking without an AD for improved mobility and to decrease her risk of  falling.    OBJECTIVE IMPAIRMENTS Abnormal gait, decreased balance, decreased endurance, decreased mobility, difficulty walking, decreased ROM, decreased strength, hypomobility, and impaired flexibility.    ACTIVITY LIMITATIONS standing, squatting, stairs, and locomotion level   PARTICIPATION LIMITATIONS: cleaning, shopping, and community activity   PERSONAL FACTORS Age, Fitness, and 1 comorbidity: family history of arthritis  are also affecting patient's functional outcome.    REHAB POTENTIAL: Good   CLINICAL DECISION MAKING: Stable/uncomplicated   EVALUATION COMPLEXITY: Low     GOALS: Goals reviewed with patient? No   SHORT TERM GOALS: Target date: 03/07/2022  Pt will be independent with HEP in order to improve strength  and balance in order to decrease fall risk and improve function at home and work. Baseline: Performing independently  Goal status: Ongoing    2.  Patient will ambulate without donning surgical boot as evidenced of tissue healing and improvement in ankle stability.  Baseline: Wearing surgical boot  Goal status: Achieved    3.  Pt will increase 6MWT by at least 48m(1629f in order to demonstrate clinically significant improvement in cardiopulmonary endurance and community ambulation Baseline: 900 ft 04/01/22: 850 ft  Goal status: Ongoing        LONG TERM GOALS: Target date: 05/02/2022    Patient will have improved function and activity level as evidenced by an increase in FOTO score by 10 points or more.  Baseline: 47/100 with target of 60  04/01/22: 49  Goal status: Ongoing   2.  Patient will improve RLE strength to be symmetrical with LLE for improved gait mechanics and weight bearing without use of AD.  Baseline: PF R/L 2, NT for hip strength   04/01/22: PF R/L 5/5 Goal status: Achieved    3.  Patient will negotiate a flight of stairs as evidence of improved mobility and improved surgical healing  Baseline: NT    04/01/22: Able to negotiate stairs with use of UE support and step to pattern  Goal status: Achieved    4.  Patient will ambulate 1,000 ft during 64m32mas evidence that her left foot is healing and she is capable of ambulating community level distances.  Baseline: 900 ft 04/01/22: 850 ft  Goal status: Ongoing        PLAN:  PT FREQUENCY: 1-2x/week   PT DURATION: 10 weeks   PLANNED INTERVENTIONS: Therapeutic exercises, Neuromuscular re-education, Balance training, Gait training, Patient/Family education, Self Care, Joint mobilization, Joint manipulation, Stair training, DME instructions, Aquatic Therapy, Electrical stimulation, Cryotherapy, Moist heat, scar mobilization, Manual therapy, and Re-evaluation   PLAN FOR NEXT SESSION: Continue with dynamic balance  exercises. [place] exercises for LE strengthening and balance exercises and progress walking without AD and dynamic balance exercises      DanBradly Chris, DPT  04/03/2022, 11:45 AM

## 2022-04-08 ENCOUNTER — Encounter: Payer: Self-pay | Admitting: Physical Therapy

## 2022-04-08 ENCOUNTER — Ambulatory Visit: Payer: PPO | Admitting: Physical Therapy

## 2022-04-08 DIAGNOSIS — R262 Difficulty in walking, not elsewhere classified: Secondary | ICD-10-CM

## 2022-04-08 DIAGNOSIS — M79671 Pain in right foot: Secondary | ICD-10-CM | POA: Diagnosis not present

## 2022-04-08 NOTE — Therapy (Signed)
OUTPATIENT PHYSICAL THERAPY PROGRESS NOTE   Patient Name: Claudia Terry MRN: [id number] DOB:1945/12/29, 76 y.o., female Today's Date: 04/08/2022  PCP: Dr. Shirline Frees  REFERRING PROVIDER: Dr. Shirline Frees   END OF SESSION:   PT End of Session - 04/08/22 1107     Visit Number 12    Number of Visits 20    Date for PT Re-Evaluation 05/01/22    Authorization Type [id number]    Authorization Time Period 02/20/22-05/01/22    Authorization - Visit Number 12    Authorization - Number of Visits 20    Progress Note Due on Visit 20    PT Start Time 1105    PT Stop Time 1145    PT Time Calculation (min) 40 min    Equipment Utilized During Treatment Gait belt    Activity Tolerance Patient tolerated treatment well    Behavior During Therapy WFL for tasks assessed/performed                  Past Medical History:  Diagnosis Date   Arthritis    Complication of anesthesia    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 Tiffany Kocher   History of 2019 novel coronavirus disease (COVID-19) 01/15/2021   Hypertension    Low back pain 04/1994   Menopause    Osteoarthritis    Restless leg syndrome 1999   Past Surgical History:  Procedure Laterality Date   ACHILLES TENDON SURGERY Right 11/16/2021   Procedure: PTAL - ACHILLES TENDON MICROTENOTOMY;  Surgeon: Caroline More, DPM;  Location: [facility];  Service: Podiatry;  Laterality: Right;   ARTHRODESIS METATARSAL Right 11/16/2021   Procedure: ARTHRODESIS METATARSAL, T-N, C-C OR MET-CUNNEIFORM, NCJ;  Surgeon: Caroline More, DPM;  Location: [facility];  Service: Podiatry;  Laterality: Right;   CATARACT EXTRACTION     CATARACT EXTRACTION W/PHACO Left 08/15/2020   Procedure: CATARACT EXTRACTION PHACO AND INTRAOCULAR LENS PLACEMENT (IOC) LEFT 6.60 00:39.8;  Surgeon: Birder Robson, MD;  Location: [place];  Service: Ophthalmology;  Laterality: Left;   CATARACT EXTRACTION W/PHACO Right  08/29/2020   Procedure: CATARACT EXTRACTION PHACO AND INTRAOCULAR LENS PLACEMENT (IOC) RIGHT 6.94 00:42.3;  Surgeon: Birder Robson, MD;  Location: [place];  Service: Ophthalmology;  Laterality: Right;   [place]   X2   COLONOSCOPY  12/06/2011   normal   COLONOSCOPY WITH PROPOFOL N/A 04/13/2018   Procedure: COLONOSCOPY WITH PROPOFOL;  Surgeon: Manya Silvas, MD;  Location: [facility];  Service: Endoscopy;  Laterality: N/A;   ESOPHAGOGASTRODUODENOSCOPY (EGD) WITH PROPOFOL N/A 02/23/2015   Procedure: ESOPHAGOGASTRODUODENOSCOPY (EGD) WITH PROPOFOL;  Surgeon: Manya Silvas, MD;  Location: [facility] ENDOSCOPY;  Service: Endoscopy;  Laterality: N/A;   ESOPHAGOGASTRODUODENOSCOPY (EGD) WITH PROPOFOL N/A 04/13/2018   Procedure: ESOPHAGOGASTRODUODENOSCOPY (EGD) WITH PROPOFOL;  Surgeon: Manya Silvas, MD;  Location: [facility] ENDOSCOPY;  Service: Endoscopy;  Laterality: N/A;   FOOT ARTHRODESIS Right 11/16/2021   Procedure: ARTHRODESIS FOOT; TRIPLE;  Surgeon: Caroline More, DPM;  Location: [facility];  Service: Podiatry;  Laterality: Right;   FOOT SURGERY     with screws   HARDWARE REMOVAL Right 11/16/2021   Procedure: HARDWARE REMOVAL;  Surgeon: Caroline More, DPM;  Location: [facility];  Service: Podiatry;  Laterality: Right;   HIP CLOSED REDUCTION Left 08/08/2015   Procedure: CLOSED REDUCTION HIP;  Surgeon: Dereck Leep, MD;  Location: [facility];  Service: Orthopedics;  Laterality: Left;  HIP CLOSED REDUCTION Left 09/18/2015   Procedure: CLOSED REDUCTION HIP;  Surgeon: Hessie Knows, MD;  Location: [facility];  Service: Orthopedics;  Laterality: Left;   HIP CLOSED REDUCTION Left 12/01/2015   Procedure: CLOSED REDUCTION HIP;  Surgeon: Dereck Leep, MD;  Location: [facility];  Service: Orthopedics;  Laterality: Left;  no prep no incision   JOINT REPLACEMENT  10/2016   left knee   KNEE ARTHROPLASTY Left 10/28/2016   Procedure: COMPUTER ASSISTED TOTAL KNEE ARTHROPLASTY;   Surgeon: Dereck Leep, MD;  Location: [facility];  Service: Orthopedics;  Laterality: Left;   KNEE ARTHROPLASTY Right 07/07/2017   Procedure: COMPUTER ASSISTED TOTAL KNEE ARTHROPLASTY;  Surgeon: Dereck Leep, MD;  Location: [facility];  Service: Orthopedics;  Laterality: Right;   KNEE ARTHROSCOPY Right 11/14/2014   Procedure: ARTHROSCOPY KNEE;  Surgeon: Dereck Leep, MD;  Location: [facility];  Service: Orthopedics;  Laterality: Right;  Posterior horn menicus, anterior lateral menicus grade 3 chondromalacia   phlebitis groin     right foot surgery     SHOULDER ARTHROSCOPY WITH OPEN ROTATOR CUFF REPAIR Left 03/10/2019   Procedure: LEFT SHOULDER ARTHROSCOPY, ARTHROSCOPIC ROTATOR CUFF REPAIR, DISTAL CLAVICLE EXCISION, SUBACROMIAL DECOMPRESSION, INTRA-ARTICULAR DEBRIDEMENT ;  Surgeon: Lovell Sheehan, MD;  Location: [facility];  Service: Orthopedics;  Laterality: Left;   SHOULDER ARTHROSCOPY WITH ROTATOR CUFF REPAIR Left 06/21/2019   Procedure: SHOULDER ARTHROSCOPY WITH EXTENSIVE INTRAARTICULAR DEBRIDEMENT WITH REMOVAL OF HARDWARE;  Surgeon: Lovell Sheehan, MD;  Location: [place];  Service: Orthopedics;  Laterality: Left;   TONSILLECTOMY     TOTAL HIP ARTHROPLASTY Left 05/08/2011   necrosis of hip   TOTAL HIP ARTHROPLASTY Right 06/14/2015   Procedure: TOTAL HIP ARTHROPLASTY;  Surgeon: Dereck Leep, MD;  Location: [facility];  Service: Orthopedics;  Laterality: Right;   TOTAL HIP REVISION Left 02/21/2016   Procedure: TOTAL HIP REVISION;  Surgeon: Dereck Leep, MD;  Location: [facility];  Service: Orthopedics;  Laterality: Left;   Patient Active Problem List   Diagnosis Date Noted   Pain, postoperative, acute 11/16/2021   Generalized osteoarthritis of multiple sites 07/28/2018   Varicose veins of both legs with edema 07/28/2018   FH: colon polyps 02/10/2018   S/P total knee arthroplasty 07/07/2017   Status post total left knee replacement 10/28/2016   Failed total hip arthroplasty  with dislocation ([place]) 12/01/2015   S/P closed reduction of dislocated total hip prosthesis 08/08/2015   S/P total hip arthroplasty 06/14/2015   Benign essential HTN 12/28/2013   Acid reflux 12/28/2013   HLD (hyperlipidemia) 12/28/2013   Restless leg syndrome 12/28/2013   Depression 1999    REFERRING DIAG:  S/p R achiless tendon lengthening and right ankle arthrodesis.     THERAPY DIAG:  Pain in right foot  Difficulty in walking, not elsewhere classified  Rationale for Evaluation and Treatment Rehabilitation  PERTINENT HISTORY: Pt reports s/p 12 weeks after undergoing right Achilles tendon lengthening, triple arthrodesis, naviculocuneiform jont fusion. She has some post surgical issues with screws breaking and a wound infection. However, despite the issues foot is healing well as evidenced by most recent imaging. She has a long history of ostearthritis that runs in her family. 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   PRECAUTIONS: WBAT  SUBJECTIVE: Pt reports walking over weekend without cane. She still uses roller while walking in her yard.    PAIN:  Are you having pain? Yes: NPRS scale: 2-3/10  Pain location: Top surface of right foot  Pain description: Achy Aggravating factors: Walking on foot  Relieving factors: Keeping weight off foot    OBJECTIVE: (objective measures completed at initial evaluation unless otherwise dated)  VITALS: BP 128/70 HR 76 SpO2 100   DIAGNOSTIC FINDINGS: CT scan 01/30/22: CLINICAL DATA: Achilles tendon lengthening. Triple arthrodesis. Ten week follow-up evaluation.  EXAM: CT OF THE RIGHT FOOT WITHOUT CONTRAST  CT OF THE RIGHT ANKLE WITHOUT CONTRAST  TECHNIQUE: Multidetector CT imaging of the right foot was performed according to the standard protocol. Multiplanar CT image reconstructions were also generated.  Multidetector CT imaging of the right ankle was performed according to the standard protocol.  Multiplanar CT image reconstructions were also generated.  RADIATION DOSE REDUCTION: This exam was performed according to the departmental dose-optimization program which includes automated exposure control, adjustment of the mA and/or kV according to patient size and/or use of iterative reconstruction technique.  COMPARISON: None Available.  FINDINGS: Bones/Joint/Cartilage  No acute fracture or dislocation. Subtalar arthrodesis transfixed with 2 cannulated screws with developing bone bridging across the posterior subtalar joint. Talonavicular arthrodesis transfixed with dorsal medial sideplate and multiple interlocking screws with severe joint space narrowing and a small area possible bone bridging along the medial aspect. Arthrodesis of the navicular-medial cuneiform with osseous bridging across the joint space. Calcaneocuboid arthrodesis with a small area of bone bridging along the mid aspect. No hardware failure or complication.  Osseous fusion of the second TMT joint. Moderate osteoarthritis of the navicular-middle cuneiform. Normal alignment. Small ankle joint effusion. Small plantar calcaneal spur.  Ligaments  Ligaments are suboptimally evaluated by CT.  Muscles and Tendons Muscles are normal. No muscle atrophy. No intramuscular fluid collection or hematoma. Flexor and extensor compartment tendons are intact. Peroneal tendons are intact. Mild thickening of the Achilles tendon likely reflecting postsurgical changes. Punctate calcifications in the medial band of the plantar fascia likely reflecting sequela prior plantar fasciitis.  Soft tissue No fluid collection or hematoma. No soft tissue mass. Soft tissue swelling along the dorsal aspect of the foot.  IMPRESSION: 1. Subtalar arthrodesis transfixed with 2 cannulated screws with developing bone bridging across the posterior subtalar joint. 2. Talonavicular arthrodesis transfixed with dorsal medial sideplate and  multiple interlocking screws with severe joint space narrowing and a small area possible bone bridging along the medial aspect. 3. Arthrodesis of the navicular-medial cuneiform with osseous bridging across the joint space. Calcaneocuboid arthrodesis with a small area of bone bridging along the mid aspect. 4. Osseous fusion of the second TMT joint. 5. Moderate osteoarthritis of the navicular-middle cuneiform.   Electronically Signed By: Kathreen Devoid M.D. On: 02/01/2022 07:17   X-rays: Right foot 3 views AP, lateral, lateral oblique: Status post triple arthrodesis with naviculocuneiform joint fusion. Medial column plate appears to be intact with screws the appropriate size and length. Screws appear the area to be the appropriate size and length at the subtalar joint level and staples appear to be providing compression across the calcaneocuboid joint. Unfortunately patient does appear to have some hardware broken, screw is broken at the talonavicular joint and one of the staples is broken at the calcaneocuboid joint. Foot appears to be in better alignment compared to preop but appears to have lost some correction compared to initial postop imaging as there appears to be reduction in calcaneal inclination angle and talar declination angle appears to be increased, forefoot abduction angle also appears to be increased compared to initial postoperative imaging. Joint still appear to be in  stable position and improved compared to preop clinically. Noticed more bony consolidation across the navicular cuneiform joint, talonavicular joint, calcaneocuboid joint, and subtalar joints.   PATIENT SURVEYS:  FOTO 47/100 with target of 60   COGNITION:  Overall cognitive status: Within functional limits for tasks assessed                          SENSATION: WFL   EDEMA: Deferred, lipoma on lateral malleolus of right foot.    MUSCLE LENGTH: Hamstrings: Right 70 deg; Left 70 deg with restriction  Marcello Moores test:  Right NT deg; Left NT  deg   POSTURE: rounded shoulders and forward head   PALPATION: Lipoma on lateral side of right foot along right malleolus     LOWER EXTREMITY ROM:  Active  Right 02/20/2022 Left 02/20/2022  Hip flexion 120 120  Hip extension 30 30  Hip abduction 45 45  Hip adduction 30 30  Hip internal rotation 45 45  Hip external rotation 45 45  Knee flexion 135 135  Knee extension 0 0  Ankle dorsiflexion 20 20  Ankle plantarflexion 15/20 20/20  Ankle inversion 20/25 35/35  Ankle eversion 10/15 15/15   (Blank rows = not tested)        LOWER EXTREMITY MMT:   MMT Right eval Left eval Right  04/01/22 Left 04/01/22  Hip flexion        Hip extension        Hip abduction  3+  3+    Hip adduction  4-  4-    Hip internal rotation        Hip external rotation        Knee flexion 5 5    Knee extension 5 5    Ankle dorsiflexion 5 5    Ankle plantarflexion 2 (Long Sitting) NT 5 5  Ankle inversion 5 5    Ankle eversion 5 5     (Blank rows = not tested)     FUNCTIONAL TESTS:  6 minute walk test: 900 ft with use of rollator    GAIT: Distance walked: 50 ft  Assistive device utilized: Environmental consultant - 4 wheeled Level of assistance: Modified independence Comments: Pt donning surgical boot so gait assessment deferred        TODAY'S TREATMENT:  04/08/22   THEREX   With ankle brace:  Standing hip abduction with 1 hand for support 3 x 10 Standing hip adduction with bue support 3 x 10    Without ankle brace:   Standing hip abduction with 1 hand for support 1 x 10 Standing hip adduction with bue support 1 x 10  Standing Heel Raises on 6 inch step with BUE support 2 x 10   NMR  With brace   Semi-tandem 6 x 10 sec  -min VC to adjust feet to allow heel to toe   Without brace   Semi-tandem 6 x 10 sec  -min VC to adjust feet to allow heel to toe   Toe Taps onto 6 inch step 1 x 10  Toe Taps onto 8 inch step 1 x 10  04/03/22  THEREX   TM at 1.5 mph  with BUE support for 5 min  Standing right groin stretch 3 x 30 sec    NMR  3 cone weave spaced 15 ft apart x 20 with pt allowed to look down at ground  3 cone weave spaced 15 ft apart x 20 with pt  instructed to look up  3 obstacle step over for 10 m x 10  6 obstacle step over for 10 m x 10  -min VC to lead with LLE  Backwards walk 10 m x 6  -Pt reports increased relief in left groin after exercise  Sideways walk 10 m x 6 to right and to left  Tandem walk 10 m x 6 with 1 UE support  -pt shows difficulty with left foot placement even with mod VC for sequence   04/01/22  44mT: 850 ft without use of SPC  FOTO: 49/100 Ankle MMT: PF R/L 5/5  Stairs: Step to, with use of railings  Modified Single Leg Stance with use of yoga block 3 x 60 sec  Hip Abduction with two finger 2 x 10  Hip Abduction with two finger #3 AW 3 x 10   03/25/22:  THEREX   TM with BUE support at 1.0 mph for 5 mi  1,010 ft without AD around parking lot  -Supinated left foot with in toeing due to ankle deformities   Negotiate 1 foot curb without AD -min VC to step up with LLE and step down with RLE   NMR   Toe Taps onto 6 inch cone 3 x 10  -intermittent use of UE support     03/20/22:  THEREX   TM with BUE support at 1.5 mph for 5 min   NMR   10 m normal pace walk x 10  10 m vertical head turns x 8 10 m horizontal head turns x 8  10 m fast and slow x 6   Modified single leg stance 6 x 30 sec on water bottle        HOME EXERCISE PROGRAM:  Access Code: [email or web address]: [email or web address] Date: 03/25/2022 Prepared by: DBradly Chris Exercises - Standing Hip Abduction with Counter Support  - 1 x daily - 3 x weekly - 3 sets - 10 reps - Prone Quadriceps Stretch with Strap  - 1 x daily - 7 x weekly - 3 reps - 30-60 hold - Standing Bilateral Heel Raise on Step  - 1 x daily - 3 x weekly - 3 sets - 10 reps - Seated Heel Raise  - 3 x weekly - 3 sets - 10 reps - Half Tandem  Stance Balance with Eyes Closed  - 1 x daily - 7 x weekly - 5 reps - 10 hold - Standing Toe Taps  - 1 x daily - 3 x weekly - 3 sets - 10 reps -Modified Single Leg Stance on yoga block 3 x 30 sec x 7 days per week  -Standing Hip Adduction Stretch 3 x 30 sec x 7 days per week    ASSESSMENT:   CLINICAL IMPRESSION:  Pt shows an improvement in her right ankle strength and static balance with ability to perform exercises without use of her ankle brace and ability to perform tandem stance exercises. PT advised pt not use brace during exercises to further improve ankle strength. She will continue benefit from skilled PT to increase her static and dynamic balance and to return to walking without an AD for improved mobility and to decrease her risk of falling.  OBJECTIVE IMPAIRMENTS Abnormal gait, decreased balance, decreased endurance, decreased mobility, difficulty walking, decreased ROM, decreased strength, hypomobility, and impaired flexibility.    ACTIVITY LIMITATIONS standing, squatting, stairs, and locomotion level   PARTICIPATION LIMITATIONS: cleaning, shopping, and community activity   PERSONAL FACTORS Age, Fitness, and 1 comorbidity:  family history of arthritis  are also affecting patient's functional outcome.    REHAB POTENTIAL: Good   CLINICAL DECISION MAKING: Stable/uncomplicated   EVALUATION COMPLEXITY: Low     GOALS: Goals reviewed with patient? No   SHORT TERM GOALS: Target date: 03/07/2022  Pt will be independent with HEP in order to improve strength and balance in order to decrease fall risk and improve function at home and work. Baseline: Performing independently  Goal status: Ongoing    2.  Patient will ambulate without donning surgical boot as evidenced of tissue healing and improvement in ankle stability.  Baseline: Wearing surgical boot  Goal status: Achieved    3.  Pt will increase 6MWT by at least 30m(1660f in order to demonstrate clinically significant  improvement in cardiopulmonary endurance and community ambulation Baseline: 900 ft 04/01/22: 850 ft  Goal status: Ongoing        LONG TERM GOALS: Target date: 05/02/2022    Patient will have improved function and activity level as evidenced by an increase in FOTO score by 10 points or more.  Baseline: 47/100 with target of 60  04/01/22: 49  Goal status: Ongoing   2.  Patient will improve RLE strength to be symmetrical with LLE for improved gait mechanics and weight bearing without use of AD.  Baseline: PF R/L 2, NT for hip strength   04/01/22: PF R/L 5/5 Goal status: Achieved    3.  Patient will negotiate a flight of stairs as evidence of improved mobility and improved surgical healing  Baseline: NT    04/01/22: Able to negotiate stairs with use of UE support and step to pattern  Goal status: Achieved    4.  Patient will ambulate 1,000 ft during 34m35mas evidence that her left foot is healing and she is capable of ambulating community level distances.  Baseline: 900 ft 04/01/22: 850 ft  Goal status: Ongoing        PLAN:  PT FREQUENCY: 1-2x/week   PT DURATION: 10 weeks   PLANNED INTERVENTIONS: Therapeutic exercises, Neuromuscular re-education, Balance training, Gait training, Patient/Family education, Self Care, Joint mobilization, Joint manipulation, Stair training, DME instructions, Aquatic Therapy, Electrical stimulation, Cryotherapy, Moist heat, scar mobilization, Manual therapy, and Re-evaluation   PLAN FOR NEXT SESSION: Continue with dynamic balance exercises. [place] exercises for LE strengthening and balance exercises and progress walking without AD and dynamic balance exercises      DanBradly Chris, DPT  04/08/2022, 11:08 AM

## 2022-04-10 ENCOUNTER — Ambulatory Visit: Payer: PPO | Admitting: Physical Therapy

## 2022-04-16 ENCOUNTER — Ambulatory Visit: Payer: PPO | Attending: Podiatry | Admitting: Physical Therapy

## 2022-04-16 ENCOUNTER — Encounter: Payer: Self-pay | Admitting: Physical Therapy

## 2022-04-16 DIAGNOSIS — R262 Difficulty in walking, not elsewhere classified: Secondary | ICD-10-CM | POA: Insufficient documentation

## 2022-04-16 DIAGNOSIS — M79671 Pain in right foot: Secondary | ICD-10-CM

## 2022-04-16 NOTE — Therapy (Signed)
OUTPATIENT PHYSICAL THERAPY PROGRESS NOTE   Patient Name: Claudia Terry MRN: [id number] DOB:1946/01/28, 76 y.o., female Today's Date: 04/08/2022  PCP: Dr. Shirline Frees  REFERRING PROVIDER: Dr. Shirline Frees   END OF SESSION:   PT End of Session - 04/08/22 1107     Visit Number 12    Number of Visits 20    Date for PT Re-Evaluation 05/01/22    Authorization Type [id number]    Authorization Time Period 02/20/22-05/01/22    Authorization - Visit Number 12    Authorization - Number of Visits 20    Progress Note Due on Visit 20    PT Start Time 1105    PT Stop Time 1145    PT Time Calculation (min) 40 min    Equipment Utilized During Treatment Gait belt    Activity Tolerance Patient tolerated treatment well    Behavior During Therapy WFL for tasks assessed/performed                  Past Medical History:  Diagnosis Date   Arthritis    Complication of anesthesia    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 Tiffany Kocher   History of 2019 novel coronavirus disease (COVID-19) 01/15/2021   Hypertension    Low back pain 04/1994   Menopause    Osteoarthritis    Restless leg syndrome 1999   Past Surgical History:  Procedure Laterality Date   ACHILLES TENDON SURGERY Right 11/16/2021   Procedure: PTAL - ACHILLES TENDON MICROTENOTOMY;  Surgeon: Caroline More, DPM;  Location: [facility];  Service: Podiatry;  Laterality: Right;   ARTHRODESIS METATARSAL Right 11/16/2021   Procedure: ARTHRODESIS METATARSAL, T-N, C-C OR MET-CUNNEIFORM, NCJ;  Surgeon: Caroline More, DPM;  Location: [facility];  Service: Podiatry;  Laterality: Right;   CATARACT EXTRACTION     CATARACT EXTRACTION W/PHACO Left 08/15/2020   Procedure: CATARACT EXTRACTION PHACO AND INTRAOCULAR LENS PLACEMENT (IOC) LEFT 6.60 00:39.8;  Surgeon: Birder Robson, MD;  Location: [place];  Service: Ophthalmology;  Laterality: Left;   CATARACT EXTRACTION W/PHACO Right  08/29/2020   Procedure: CATARACT EXTRACTION PHACO AND INTRAOCULAR LENS PLACEMENT (IOC) RIGHT 6.94 00:42.3;  Surgeon: Birder Robson, MD;  Location: [place];  Service: Ophthalmology;  Laterality: Right;   [place]   X2   COLONOSCOPY  12/06/2011   normal   COLONOSCOPY WITH PROPOFOL N/A 04/13/2018   Procedure: COLONOSCOPY WITH PROPOFOL;  Surgeon: Manya Silvas, MD;  Location: [facility];  Service: Endoscopy;  Laterality: N/A;   ESOPHAGOGASTRODUODENOSCOPY (EGD) WITH PROPOFOL N/A 02/23/2015   Procedure: ESOPHAGOGASTRODUODENOSCOPY (EGD) WITH PROPOFOL;  Surgeon: Manya Silvas, MD;  Location: [facility];  Service: Endoscopy;  Laterality: N/A;   ESOPHAGOGASTRODUODENOSCOPY (EGD) WITH PROPOFOL N/A 04/13/2018   Procedure: ESOPHAGOGASTRODUODENOSCOPY (EGD) WITH PROPOFOL;  Surgeon: Manya Silvas, MD;  Location: [facility] ENDOSCOPY;  Service: Endoscopy;  Laterality: N/A;   FOOT ARTHRODESIS Right 11/16/2021   Procedure: ARTHRODESIS FOOT; TRIPLE;  Surgeon: Caroline More, DPM;  Location: [facility];  Service: Podiatry;  Laterality: Right;   FOOT SURGERY     with screws   HARDWARE REMOVAL Right 11/16/2021   Procedure: HARDWARE REMOVAL;  Surgeon: Caroline More, DPM;  Location: [facility];  Service: Podiatry;  Laterality: Right;   HIP CLOSED REDUCTION Left 08/08/2015   Procedure: CLOSED REDUCTION HIP;  Surgeon: Dereck Leep, MD;  Location: [facility];  Service: Orthopedics;  Laterality: Left;  HIP CLOSED REDUCTION Left 09/18/2015   Procedure: CLOSED REDUCTION HIP;  Surgeon: Hessie Knows, MD;  Location: [facility];  Service: Orthopedics;  Laterality: Left;   HIP CLOSED REDUCTION Left 12/01/2015   Procedure: CLOSED REDUCTION HIP;  Surgeon: Dereck Leep, MD;  Location: [facility];  Service: Orthopedics;  Laterality: Left;  no prep no incision   JOINT REPLACEMENT  10/2016   left knee   KNEE ARTHROPLASTY Left 10/28/2016   Procedure: COMPUTER ASSISTED TOTAL KNEE ARTHROPLASTY;   Surgeon: Dereck Leep, MD;  Location: [facility];  Service: Orthopedics;  Laterality: Left;   KNEE ARTHROPLASTY Right 07/07/2017   Procedure: COMPUTER ASSISTED TOTAL KNEE ARTHROPLASTY;  Surgeon: Dereck Leep, MD;  Location: [facility];  Service: Orthopedics;  Laterality: Right;   KNEE ARTHROSCOPY Right 11/14/2014   Procedure: ARTHROSCOPY KNEE;  Surgeon: Dereck Leep, MD;  Location: [facility];  Service: Orthopedics;  Laterality: Right;  Posterior horn menicus, anterior lateral menicus grade 3 chondromalacia   phlebitis groin     right foot surgery     SHOULDER ARTHROSCOPY WITH OPEN ROTATOR CUFF REPAIR Left 03/10/2019   Procedure: LEFT SHOULDER ARTHROSCOPY, ARTHROSCOPIC ROTATOR CUFF REPAIR, DISTAL CLAVICLE EXCISION, SUBACROMIAL DECOMPRESSION, INTRA-ARTICULAR DEBRIDEMENT ;  Surgeon: Lovell Sheehan, MD;  Location: [facility];  Service: Orthopedics;  Laterality: Left;   SHOULDER ARTHROSCOPY WITH ROTATOR CUFF REPAIR Left 06/21/2019   Procedure: SHOULDER ARTHROSCOPY WITH EXTENSIVE INTRAARTICULAR DEBRIDEMENT WITH REMOVAL OF HARDWARE;  Surgeon: Lovell Sheehan, MD;  Location: [place];  Service: Orthopedics;  Laterality: Left;   TONSILLECTOMY     TOTAL HIP ARTHROPLASTY Left 05/08/2011   necrosis of hip   TOTAL HIP ARTHROPLASTY Right 06/14/2015   Procedure: TOTAL HIP ARTHROPLASTY;  Surgeon: Dereck Leep, MD;  Location: [facility];  Service: Orthopedics;  Laterality: Right;   TOTAL HIP REVISION Left 02/21/2016   Procedure: TOTAL HIP REVISION;  Surgeon: Dereck Leep, MD;  Location: [facility];  Service: Orthopedics;  Laterality: Left;   Patient Active Problem List   Diagnosis Date Noted   Pain, postoperative, acute 11/16/2021   Generalized osteoarthritis of multiple sites 07/28/2018   Varicose veins of both legs with edema 07/28/2018   FH: colon polyps 02/10/2018   S/P total knee arthroplasty 07/07/2017   Status post total left knee replacement 10/28/2016   Failed total hip arthroplasty  with dislocation ([place]) 12/01/2015   S/P closed reduction of dislocated total hip prosthesis 08/08/2015   S/P total hip arthroplasty 06/14/2015   Benign essential HTN 12/28/2013   Acid reflux 12/28/2013   HLD (hyperlipidemia) 12/28/2013   Restless leg syndrome 12/28/2013   Depression 1999    REFERRING DIAG:  S/p R achiless tendon lengthening and right ankle arthrodesis.     THERAPY DIAG:  Pain in right foot  Difficulty in walking, not elsewhere classified  Rationale for Evaluation and Treatment Rehabilitation  PERTINENT HISTORY: Pt reports s/p 12 weeks after undergoing right Achilles tendon lengthening, triple arthrodesis, naviculocuneiform jont fusion. She has some post surgical issues with screws breaking and a wound infection. However, despite the issues foot is healing well as evidenced by most recent imaging. She has a long history of ostearthritis that runs in her family. 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   PRECAUTIONS: WBAT  SUBJECTIVE: Pt reports increased activity with family visiting since last session. She did have a near fall a month ago that resulted in chipped tooth which she had worked on.  No falls since last session and no increase in pain.    PAIN:  Are you having pain? No   OBJECTIVE: (objective measures completed at initial evaluation unless otherwise dated)  VITALS: BP 128/70 HR 76 SpO2 100   DIAGNOSTIC FINDINGS: CT scan 01/30/22: CLINICAL DATA: Achilles tendon lengthening. Triple arthrodesis. Ten week follow-up evaluation.  EXAM: CT OF THE RIGHT FOOT WITHOUT CONTRAST  CT OF THE RIGHT ANKLE WITHOUT CONTRAST  TECHNIQUE: Multidetector CT imaging of the right foot was performed according to the standard protocol. Multiplanar CT image reconstructions were also generated.  Multidetector CT imaging of the right ankle was performed according to the standard protocol. Multiplanar CT image reconstructions were also  generated.  RADIATION DOSE REDUCTION: This exam was performed according to the departmental dose-optimization program which includes automated exposure control, adjustment of the mA and/or kV according to patient size and/or use of iterative reconstruction technique.  COMPARISON: None Available.  FINDINGS: Bones/Joint/Cartilage  No acute fracture or dislocation. Subtalar arthrodesis transfixed with 2 cannulated screws with developing bone bridging across the posterior subtalar joint. Talonavicular arthrodesis transfixed with dorsal medial sideplate and multiple interlocking screws with severe joint space narrowing and a small area possible bone bridging along the medial aspect. Arthrodesis of the navicular-medial cuneiform with osseous bridging across the joint space. Calcaneocuboid arthrodesis with a small area of bone bridging along the mid aspect. No hardware failure or complication.  Osseous fusion of the second TMT joint. Moderate osteoarthritis of the navicular-middle cuneiform. Normal alignment. Small ankle joint effusion. Small plantar calcaneal spur.  Ligaments  Ligaments are suboptimally evaluated by CT.  Muscles and Tendons Muscles are normal. No muscle atrophy. No intramuscular fluid collection or hematoma. Flexor and extensor compartment tendons are intact. Peroneal tendons are intact. Mild thickening of the Achilles tendon likely reflecting postsurgical changes. Punctate calcifications in the medial band of the plantar fascia likely reflecting sequela prior plantar fasciitis.  Soft tissue No fluid collection or hematoma. No soft tissue mass. Soft tissue swelling along the dorsal aspect of the foot.  IMPRESSION: 1. Subtalar arthrodesis transfixed with 2 cannulated screws with developing bone bridging across the posterior subtalar joint. 2. Talonavicular arthrodesis transfixed with dorsal medial sideplate and multiple interlocking screws with severe joint  space narrowing and a small area possible bone bridging along the medial aspect. 3. Arthrodesis of the navicular-medial cuneiform with osseous bridging across the joint space. Calcaneocuboid arthrodesis with a small area of bone bridging along the mid aspect. 4. Osseous fusion of the second TMT joint. 5. Moderate osteoarthritis of the navicular-middle cuneiform.   Electronically Signed By: Kathreen Devoid M.D. On: 02/01/2022 07:17   X-rays: Right foot 3 views AP, lateral, lateral oblique: Status post triple arthrodesis with naviculocuneiform joint fusion. Medial column plate appears to be intact with screws the appropriate size and length. Screws appear the area to be the appropriate size and length at the subtalar joint level and staples appear to be providing compression across the calcaneocuboid joint. Unfortunately patient does appear to have some hardware broken, screw is broken at the talonavicular joint and one of the staples is broken at the calcaneocuboid joint. Foot appears to be in better alignment compared to preop but appears to have lost some correction compared to initial postop imaging as there appears to be reduction in calcaneal inclination angle and talar declination angle appears to be increased, forefoot abduction angle also appears to be increased compared to initial postoperative imaging. Joint still appear to be in stable position and improved  compared to preop clinically. Noticed more bony consolidation across the navicular cuneiform joint, talonavicular joint, calcaneocuboid joint, and subtalar joints.   PATIENT SURVEYS:  FOTO 47/100 with target of 60   COGNITION:  Overall cognitive status: Within functional limits for tasks assessed                          SENSATION: WFL   EDEMA: Deferred, lipoma on lateral malleolus of right foot.    MUSCLE LENGTH: Hamstrings: Right 70 deg; Left 70 deg with restriction  Marcello Moores test: Right NT deg; Left NT  deg   POSTURE: rounded  shoulders and forward head   PALPATION: Lipoma on lateral side of right foot along right malleolus     LOWER EXTREMITY ROM:  Active  Right 02/20/2022 Left 02/20/2022  Hip flexion 120 120  Hip extension 30 30  Hip abduction 45 45  Hip adduction 30 30  Hip internal rotation 45 45  Hip external rotation 45 45  Knee flexion 135 135  Knee extension 0 0  Ankle dorsiflexion 20 20  Ankle plantarflexion 15/20 20/20  Ankle inversion 20/25 35/35  Ankle eversion 10/15 15/15   (Blank rows = not tested)        LOWER EXTREMITY MMT:   MMT Right eval Left eval Right  04/01/22 Left 04/01/22  Hip flexion        Hip extension        Hip abduction  3+  3+    Hip adduction  4-  4-    Hip internal rotation        Hip external rotation        Knee flexion 5 5    Knee extension 5 5    Ankle dorsiflexion 5 5    Ankle plantarflexion 2 (Long Sitting) NT 5 5  Ankle inversion 5 5    Ankle eversion 5 5     (Blank rows = not tested)     FUNCTIONAL TESTS:  6 minute walk test: 900 ft with use of rollator    GAIT: Distance walked: 50 ft  Assistive device utilized: Environmental consultant - 4 wheeled Level of assistance: Modified independence Comments: Pt donning surgical boot so gait assessment deferred        TODAY'S TREATMENT:  04/16/22  TM at 1.3 mph with BUE support for 10 min  Standing Heel Raises 3 x 15  Standing Hip Abduction with 1 UE support 3 x 15  -Needed to shift from 1 UE to 2 UE during third set.  04/08/22   THEREX   With ankle brace:  Standing hip abduction with 1 hand for support 3 x 10 Standing hip adduction with bue support 3 x 10    Without ankle brace:   Standing hip abduction with 1 hand for support 1 x 10 Standing hip adduction with bue support 1 x 10  Standing Heel Raises on 6 inch step with BUE support 2 x 10   NMR  With brace   Semi-tandem 6 x 10 sec  -min VC to adjust feet to allow heel to toe   Without brace   Semi-tandem 6 x 10 sec  -min VC to adjust  feet to allow heel to toe   Toe Taps onto 6 inch step 1 x 10  Toe Taps onto 8 inch step 1 x 10  04/03/22  THEREX   TM at 1.5 mph with BUE support for 5 min  Standing right  groin stretch 3 x 30 sec    NMR  3 cone weave spaced 15 ft apart x 20 with pt allowed to look down at ground  3 cone weave spaced 15 ft apart x 20 with pt instructed to look up  3 obstacle step over for 10 m x 10  6 obstacle step over for 10 m x 10  -min VC to lead with LLE  Backwards walk 10 m x 6  -Pt reports increased relief in left groin after exercise  Sideways walk 10 m x 6 to right and to left  Tandem walk 10 m x 6 with 1 UE support  -pt shows difficulty with left foot placement even with mod VC for sequence   04/01/22  20mT: 850 ft without use of SPC  FOTO: 49/100 Ankle MMT: PF R/L 5/5  Stairs: Step to, with use of railings  Modified Single Leg Stance with use of yoga block 3 x 60 sec  Hip Abduction with two finger 2 x 10  Hip Abduction with two finger #3 AW 3 x 10   03/25/22:  THEREX   TM with BUE support at 1.0 mph for 5 mi  1,010 ft without AD around parking lot  -Supinated left foot with in toeing due to ankle deformities   Negotiate 1 foot curb without AD -min VC to step up with LLE and step down with RLE   NMR   Toe Taps onto 6 inch cone 3 x 10  -intermittent use of UE support     03/20/22:  THEREX   TM with BUE support at 1.5 mph for 5 min   NMR   10 m normal pace walk x 10  10 m vertical head turns x 8 10 m horizontal head turns x 8  10 m fast and slow x 6   Modified single leg stance 6 x 30 sec on water bottle        HOME EXERCISE PROGRAM:  Access Code: [email or web address]: [email or web address] Date: 04/16/2022 Prepared by: DBradly Chris Exercises - Standing Hip Abduction with Counter Support  - 3 x weekly - 3 sets - 15 reps - Prone Quadriceps Stretch with Strap  - 1 x daily - 7 x weekly - 3 reps - 30-60 hold - Seated Heel Raise  - 3 x  weekly - 3 sets - 15 reps - Standing Toe Taps  - 3 x weekly - 3 sets - 15 reps - Standing Heel Raise with Support  - 3 x weekly - 3 sets - 15 reps   ASSESSMENT:   CLINICAL IMPRESSION:  Shift in focus of strengthening to endurance with increased reps. Pt able to perform all exercises within endurance context with exception of standing hip abduction, which required use of BUE support instead of 1 UE support. Next session to assess aerobic endurance with pt showing steady progress for last several visits. She will continue benefit from skilled PT to increase her static and dynamic balance and to return to walking without an AD for improved mobility and to decrease her risk of falling.  OBJECTIVE IMPAIRMENTS Abnormal gait, decreased balance, decreased endurance, decreased mobility, difficulty walking, decreased ROM, decreased strength, hypomobility, and impaired flexibility.    ACTIVITY LIMITATIONS standing, squatting, stairs, and locomotion level   PARTICIPATION LIMITATIONS: cleaning, shopping, and community activity   PERSONAL FACTORS Age, Fitness, and 1 comorbidity: family history of arthritis  are also affecting patient's functional outcome.    REHAB POTENTIAL: Good  CLINICAL DECISION MAKING: Stable/uncomplicated   EVALUATION COMPLEXITY: Low     GOALS: Goals reviewed with patient? No   SHORT TERM GOALS: Target date: 03/07/2022  Pt will be independent with HEP in order to improve strength and balance in order to decrease fall risk and improve function at home and work. Baseline: Performing independently  Goal status: Ongoing    2.  Patient will ambulate without donning surgical boot as evidenced of tissue healing and improvement in ankle stability.  Baseline: Wearing surgical boot  Goal status: Achieved    3.  Pt will increase 6MWT by at least 74m(1618f in order to demonstrate clinically significant improvement in cardiopulmonary endurance and community ambulation Baseline: 900  ft 04/01/22: 850 ft  Goal status: Ongoing        LONG TERM GOALS: Target date: 05/02/2022    Patient will have improved function and activity level as evidenced by an increase in FOTO score by 10 points or more.  Baseline: 47/100 with target of 60  04/01/22: 49  Goal status: Ongoing   2.  Patient will improve RLE strength to be symmetrical with LLE for improved gait mechanics and weight bearing without use of AD.  Baseline: PF R/L 2, NT for hip strength   04/01/22: PF R/L 5/5 Goal status: Achieved    3.  Patient will negotiate a flight of stairs as evidence of improved mobility and improved surgical healing  Baseline: NT    04/01/22: Able to negotiate stairs with use of UE support and step to pattern  Goal status: Achieved    4.  Patient will ambulate 1,000 ft during 34m17mas evidence that her left foot is healing and she is capable of ambulating community level distances.  Baseline: 900 ft 04/01/22: 850 ft  Goal status: Ongoing        PLAN:  PT FREQUENCY: 1-2x/week   PT DURATION: 10 weeks   PLANNED INTERVENTIONS: Therapeutic exercises, Neuromuscular re-education, Balance training, Gait training, Patient/Family education, Self Care, Joint mobilization, Joint manipulation, Stair training, DME instructions, Aquatic Therapy, Electrical stimulation, Cryotherapy, Moist heat, scar mobilization, Manual therapy, and Re-evaluation   PLAN FOR NEXT SESSION: 6 mWT. Finalize HEP.    DanBradly Chris, DPT  04/08/2022, 11:08 AM

## 2022-04-18 ENCOUNTER — Ambulatory Visit: Payer: PPO | Admitting: Physical Therapy

## 2022-04-18 DIAGNOSIS — R262 Difficulty in walking, not elsewhere classified: Secondary | ICD-10-CM

## 2022-04-18 DIAGNOSIS — M79671 Pain in right foot: Secondary | ICD-10-CM | POA: Diagnosis not present

## 2022-04-18 NOTE — Therapy (Signed)
OUTPATIENT PHYSICAL THERAPY DISCHARGE NOTE   Patient Name: Claudia Terry MRN: [id number] DOB:Nov 13, 1945, 76 y.o., female Today's Date: 04/18/2022  PCP: Dr. Shirline Frees  REFERRING PROVIDER: Dr. Shirline Frees   END OF SESSION:   PT End of Session - 04/18/22 0942     Visit Number 14    Number of Visits 20    Date for PT Re-Evaluation 05/01/22    Authorization Type [id number]    Authorization Time Period 02/20/22-05/01/22    Authorization - Visit Number 14    Authorization - Number of Visits 20    Progress Note Due on Visit 20    PT Start Time 4403    PT Stop Time 1015    PT Time Calculation (min) 40 min    Equipment Utilized During Treatment Gait belt    Activity Tolerance Patient tolerated treatment well    Behavior During Therapy WFL for tasks assessed/performed                  Past Medical History:  Diagnosis Date   Arthritis    Complication of anesthesia    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 Tiffany Kocher   History of 2019 novel coronavirus disease (COVID-19) 01/15/2021   Hypertension    Low back pain 04/1994   Menopause    Osteoarthritis    Restless leg syndrome 1999   Past Surgical History:  Procedure Laterality Date   ACHILLES TENDON SURGERY Right 11/16/2021   Procedure: PTAL - ACHILLES TENDON MICROTENOTOMY;  Surgeon: Caroline More, DPM;  Location: [facility];  Service: Podiatry;  Laterality: Right;   ARTHRODESIS METATARSAL Right 11/16/2021   Procedure: ARTHRODESIS METATARSAL, T-N, C-C OR MET-CUNNEIFORM, NCJ;  Surgeon: Caroline More, DPM;  Location: [facility];  Service: Podiatry;  Laterality: Right;   CATARACT EXTRACTION     CATARACT EXTRACTION W/PHACO Left 08/15/2020   Procedure: CATARACT EXTRACTION PHACO AND INTRAOCULAR LENS PLACEMENT (IOC) LEFT 6.60 00:39.8;  Surgeon: Birder Robson, MD;  Location: [place];  Service: Ophthalmology;  Laterality: Left;   CATARACT EXTRACTION W/PHACO Right  08/29/2020   Procedure: CATARACT EXTRACTION PHACO AND INTRAOCULAR LENS PLACEMENT (IOC) RIGHT 6.94 00:42.3;  Surgeon: Birder Robson, MD;  Location: [place];  Service: Ophthalmology;  Laterality: Right;   [place]   X2   COLONOSCOPY  12/06/2011   normal   COLONOSCOPY WITH PROPOFOL N/A 04/13/2018   Procedure: COLONOSCOPY WITH PROPOFOL;  Surgeon: Manya Silvas, MD;  Location: [facility];  Service: Endoscopy;  Laterality: N/A;   ESOPHAGOGASTRODUODENOSCOPY (EGD) WITH PROPOFOL N/A 02/23/2015   Procedure: ESOPHAGOGASTRODUODENOSCOPY (EGD) WITH PROPOFOL;  Surgeon: Manya Silvas, MD;  Location: [facility];  Service: Endoscopy;  Laterality: N/A;   ESOPHAGOGASTRODUODENOSCOPY (EGD) WITH PROPOFOL N/A 04/13/2018   Procedure: ESOPHAGOGASTRODUODENOSCOPY (EGD) WITH PROPOFOL;  Surgeon: Manya Silvas, MD;  Location: [facility] ENDOSCOPY;  Service: Endoscopy;  Laterality: N/A;   FOOT ARTHRODESIS Right 11/16/2021   Procedure: ARTHRODESIS FOOT; TRIPLE;  Surgeon: Caroline More, DPM;  Location: [facility];  Service: Podiatry;  Laterality: Right;   FOOT SURGERY     with screws   HARDWARE REMOVAL Right 11/16/2021   Procedure: HARDWARE REMOVAL;  Surgeon: Caroline More, DPM;  Location: [facility];  Service: Podiatry;  Laterality: Right;   HIP CLOSED REDUCTION Left 08/08/2015   Procedure: CLOSED REDUCTION HIP;  Surgeon: Dereck Leep, MD;  Location: [facility];  Service: Orthopedics;  Laterality: Left;  HIP CLOSED REDUCTION Left 09/18/2015   Procedure: CLOSED REDUCTION HIP;  Surgeon: Hessie Knows, MD;  Location: [facility];  Service: Orthopedics;  Laterality: Left;   HIP CLOSED REDUCTION Left 12/01/2015   Procedure: CLOSED REDUCTION HIP;  Surgeon: Dereck Leep, MD;  Location: [facility];  Service: Orthopedics;  Laterality: Left;  no prep no incision   JOINT REPLACEMENT  10/2016   left knee   KNEE ARTHROPLASTY Left 10/28/2016   Procedure: COMPUTER ASSISTED TOTAL KNEE ARTHROPLASTY;   Surgeon: Dereck Leep, MD;  Location: [facility];  Service: Orthopedics;  Laterality: Left;   KNEE ARTHROPLASTY Right 07/07/2017   Procedure: COMPUTER ASSISTED TOTAL KNEE ARTHROPLASTY;  Surgeon: Dereck Leep, MD;  Location: [facility];  Service: Orthopedics;  Laterality: Right;   KNEE ARTHROSCOPY Right 11/14/2014   Procedure: ARTHROSCOPY KNEE;  Surgeon: Dereck Leep, MD;  Location: [facility];  Service: Orthopedics;  Laterality: Right;  Posterior horn menicus, anterior lateral menicus grade 3 chondromalacia   phlebitis groin     right foot surgery     SHOULDER ARTHROSCOPY WITH OPEN ROTATOR CUFF REPAIR Left 03/10/2019   Procedure: LEFT SHOULDER ARTHROSCOPY, ARTHROSCOPIC ROTATOR CUFF REPAIR, DISTAL CLAVICLE EXCISION, SUBACROMIAL DECOMPRESSION, INTRA-ARTICULAR DEBRIDEMENT ;  Surgeon: Lovell Sheehan, MD;  Location: [facility];  Service: Orthopedics;  Laterality: Left;   SHOULDER ARTHROSCOPY WITH ROTATOR CUFF REPAIR Left 06/21/2019   Procedure: SHOULDER ARTHROSCOPY WITH EXTENSIVE INTRAARTICULAR DEBRIDEMENT WITH REMOVAL OF HARDWARE;  Surgeon: Lovell Sheehan, MD;  Location: [place];  Service: Orthopedics;  Laterality: Left;   TONSILLECTOMY     TOTAL HIP ARTHROPLASTY Left 05/08/2011   necrosis of hip   TOTAL HIP ARTHROPLASTY Right 06/14/2015   Procedure: TOTAL HIP ARTHROPLASTY;  Surgeon: Dereck Leep, MD;  Location: [facility];  Service: Orthopedics;  Laterality: Right;   TOTAL HIP REVISION Left 02/21/2016   Procedure: TOTAL HIP REVISION;  Surgeon: Dereck Leep, MD;  Location: [facility];  Service: Orthopedics;  Laterality: Left;   Patient Active Problem List   Diagnosis Date Noted   Pain, postoperative, acute 11/16/2021   Generalized osteoarthritis of multiple sites 07/28/2018   Varicose veins of both legs with edema 07/28/2018   FH: colon polyps 02/10/2018   S/P total knee arthroplasty 07/07/2017   Status post total left knee replacement 10/28/2016   Failed total hip arthroplasty  with dislocation ([place]) 12/01/2015   S/P closed reduction of dislocated total hip prosthesis 08/08/2015   S/P total hip arthroplasty 06/14/2015   Benign essential HTN 12/28/2013   Acid reflux 12/28/2013   HLD (hyperlipidemia) 12/28/2013   Restless leg syndrome 12/28/2013   Depression 1999    REFERRING DIAG:  S/p R achiless tendon lengthening and right ankle arthrodesis.     THERAPY DIAG:  Pain in right foot  Difficulty in walking, not elsewhere classified  Rationale for Evaluation and Treatment Rehabilitation  PERTINENT HISTORY: Pt reports s/p 12 weeks after undergoing right Achilles tendon lengthening, triple arthrodesis, naviculocuneiform jont fusion. She has some post surgical issues with screws breaking and a wound infection. However, despite the issues foot is healing well as evidenced by most recent imaging. She has a long history of ostearthritis that runs in her family. 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   PRECAUTIONS: WBAT  SUBJECTIVE: Pt states that she recently started not using her cane when walking around. She continues to feel better.    PAIN:  Are you having pain? No  OBJECTIVE: (objective measures completed at initial evaluation unless otherwise dated)  VITALS: BP 128/70 HR 76 SpO2 100   DIAGNOSTIC FINDINGS: CT scan 01/30/22: CLINICAL DATA: Achilles tendon lengthening. Triple arthrodesis. Ten week follow-up evaluation.  EXAM: CT OF THE RIGHT FOOT WITHOUT CONTRAST  CT OF THE RIGHT ANKLE WITHOUT CONTRAST  TECHNIQUE: Multidetector CT imaging of the right foot was performed according to the standard protocol. Multiplanar CT image reconstructions were also generated.  Multidetector CT imaging of the right ankle was performed according to the standard protocol. Multiplanar CT image reconstructions were also generated.  RADIATION DOSE REDUCTION: This exam was performed according to the departmental dose-optimization program  which includes automated exposure control, adjustment of the mA and/or kV according to patient size and/or use of iterative reconstruction technique.  COMPARISON: None Available.  FINDINGS: Bones/Joint/Cartilage  No acute fracture or dislocation. Subtalar arthrodesis transfixed with 2 cannulated screws with developing bone bridging across the posterior subtalar joint. Talonavicular arthrodesis transfixed with dorsal medial sideplate and multiple interlocking screws with severe joint space narrowing and a small area possible bone bridging along the medial aspect. Arthrodesis of the navicular-medial cuneiform with osseous bridging across the joint space. Calcaneocuboid arthrodesis with a small area of bone bridging along the mid aspect. No hardware failure or complication.  Osseous fusion of the second TMT joint. Moderate osteoarthritis of the navicular-middle cuneiform. Normal alignment. Small ankle joint effusion. Small plantar calcaneal spur.  Ligaments  Ligaments are suboptimally evaluated by CT.  Muscles and Tendons Muscles are normal. No muscle atrophy. No intramuscular fluid collection or hematoma. Flexor and extensor compartment tendons are intact. Peroneal tendons are intact. Mild thickening of the Achilles tendon likely reflecting postsurgical changes. Punctate calcifications in the medial band of the plantar fascia likely reflecting sequela prior plantar fasciitis.  Soft tissue No fluid collection or hematoma. No soft tissue mass. Soft tissue swelling along the dorsal aspect of the foot.  IMPRESSION: 1. Subtalar arthrodesis transfixed with 2 cannulated screws with developing bone bridging across the posterior subtalar joint. 2. Talonavicular arthrodesis transfixed with dorsal medial sideplate and multiple interlocking screws with severe joint space narrowing and a small area possible bone bridging along the medial aspect. 3. Arthrodesis of the navicular-medial  cuneiform with osseous bridging across the joint space. Calcaneocuboid arthrodesis with a small area of bone bridging along the mid aspect. 4. Osseous fusion of the second TMT joint. 5. Moderate osteoarthritis of the navicular-middle cuneiform.   Electronically Signed By: Kathreen Devoid M.D. On: 02/01/2022 07:17   X-rays: Right foot 3 views AP, lateral, lateral oblique: Status post triple arthrodesis with naviculocuneiform joint fusion. Medial column plate appears to be intact with screws the appropriate size and length. Screws appear the area to be the appropriate size and length at the subtalar joint level and staples appear to be providing compression across the calcaneocuboid joint. Unfortunately patient does appear to have some hardware broken, screw is broken at the talonavicular joint and one of the staples is broken at the calcaneocuboid joint. Foot appears to be in better alignment compared to preop but appears to have lost some correction compared to initial postop imaging as there appears to be reduction in calcaneal inclination angle and talar declination angle appears to be increased, forefoot abduction angle also appears to be increased compared to initial postoperative imaging. Joint still appear to be in stable position and improved compared to preop clinically. Noticed more bony consolidation across the navicular cuneiform joint, talonavicular joint, calcaneocuboid joint, and subtalar joints.  PATIENT SURVEYS:  FOTO 47/100 with target of 60   COGNITION:  Overall cognitive status: Within functional limits for tasks assessed                          SENSATION: WFL   EDEMA: Deferred, lipoma on lateral malleolus of right foot.    MUSCLE LENGTH: Hamstrings: Right 70 deg; Left 70 deg with restriction  Marcello Moores test: Right NT deg; Left NT  deg   POSTURE: rounded shoulders and forward head   PALPATION: Lipoma on lateral side of right foot along right malleolus     LOWER  EXTREMITY ROM:  Active  Right 02/20/2022 Left 02/20/2022  Hip flexion 120 120  Hip extension 30 30  Hip abduction 45 45  Hip adduction 30 30  Hip internal rotation 45 45  Hip external rotation 45 45  Knee flexion 135 135  Knee extension 0 0  Ankle dorsiflexion 20 20  Ankle plantarflexion 15/20 20/20  Ankle inversion 20/25 35/35  Ankle eversion 10/15 15/15   (Blank rows = not tested)        LOWER EXTREMITY MMT:   MMT Right eval Left eval Right  04/01/22 Left 04/01/22  Hip flexion        Hip extension        Hip abduction  3+  3+    Hip adduction  4-  4-    Hip internal rotation        Hip external rotation        Knee flexion 5 5    Knee extension 5 5    Ankle dorsiflexion 5 5    Ankle plantarflexion 2 (Long Sitting) NT 5 5  Ankle inversion 5 5    Ankle eversion 5 5     (Blank rows = not tested)     FUNCTIONAL TESTS:  6 minute walk test: 900 ft with use of rollator    GAIT: Distance walked: 50 ft  Assistive device utilized: Environmental consultant - 4 wheeled Level of assistance: Modified independence Comments: Pt donning surgical boot so gait assessment deferred        TODAY'S TREATMENT:  04/18/22 60mT: 1000 ft  -BP 124/65 HR 80 SpO2 98% RPE: 8/10   Educated pt on RPE and walking program  -use of 4-6 mod range of RPE using song test and 7-8 vigorous use of talk test to gauge intensity   Reviewed and modified HEP to include progressions.   04/16/22  TM at 1.3 mph with BUE support for 10 min  Standing Heel Raises 3 x 15  Standing Hip Abduction with 1 UE support 3 x 15  -Needed to shift from 1 UE to 2 UE during third set.  04/08/22   THEREX   With ankle brace:  Standing hip abduction with 1 hand for support 3 x 10 Standing hip adduction with bue support 3 x 10    Without ankle brace:   Standing hip abduction with 1 hand for support 1 x 10 Standing hip adduction with bue support 1 x 10  Standing Heel Raises on 6 inch step with BUE support 2 x 10    NMR  With brace   Semi-tandem 6 x 10 sec  -min VC to adjust feet to allow heel to toe   Without brace   Semi-tandem 6 x 10 sec  -min VC to adjust feet to allow heel to toe   Toe Taps onto 6 inch step  1 x 10  Toe Taps onto 8 inch step 1 x 10  04/03/22  THEREX   TM at 1.5 mph with BUE support for 5 min  Standing right groin stretch 3 x 30 sec    NMR  3 cone weave spaced 15 ft apart x 20 with pt allowed to look down at ground  3 cone weave spaced 15 ft apart x 20 with pt instructed to look up  3 obstacle step over for 10 m x 10  6 obstacle step over for 10 m x 10  -min VC to lead with LLE  Backwards walk 10 m x 6  -Pt reports increased relief in left groin after exercise  Sideways walk 10 m x 6 to right and to left  Tandem walk 10 m x 6 with 1 UE support  -pt shows difficulty with left foot placement even with mod VC for sequence   04/01/22  59mT: 850 ft without use of SPC  FOTO: 49/100 Ankle MMT: PF R/L 5/5  Stairs: Step to, with use of railings  Modified Single Leg Stance with use of yoga block 3 x 60 sec  Hip Abduction with two finger 2 x 10  Hip Abduction with two finger #3 AW 3 x 10   03/25/22:  THEREX   TM with BUE support at 1.0 mph for 5 mi  1,010 ft without AD around parking lot  -Supinated left foot with in toeing due to ankle deformities   Negotiate 1 foot curb without AD -min VC to step up with LLE and step down with RLE   NMR   Toe Taps onto 6 inch cone 3 x 10  -intermittent use of UE support     03/20/22:  THEREX   TM with BUE support at 1.5 mph for 5 min   NMR   10 m normal pace walk x 10  10 m vertical head turns x 8 10 m horizontal head turns x 8  10 m fast and slow x 6   Modified single leg stance 6 x 30 sec on water bottle        HOME EXERCISE PROGRAM:  Access Code: [email or web address]: [email or web address] Date: 04/16/2022 Prepared by: DBradly Chris Exercises - Standing Hip Abduction with Counter  Support  - 3 x weekly - 3 sets - 15 reps - Prone Quadriceps Stretch with Strap  - 1 x daily - 7 x weekly - 3 reps - 30-60 hold - Seated Heel Raise  - 3 x weekly - 3 sets - 15 reps - Standing Toe Taps  - 3 x weekly - 3 sets - 15 reps - Standing Heel Raise with Support  - 3 x weekly - 3 sets - 15 reps   ASSESSMENT:   CLINICAL IMPRESSION:  Pt has now met all of her PT goals with an improvement in aerobic endurance with increased distance on 662m test and with stable vitals. Pt given HEP with progressions to continue to maintain strength and mobility gains. Pt also educated on walking program and RPE scale to gauge intensity and perform cardiovascular exercises within moderate intensity range.   OBJECTIVE IMPAIRMENTS Abnormal gait, decreased balance, decreased endurance, decreased mobility, difficulty walking, decreased ROM, decreased strength, hypomobility, and impaired flexibility.    ACTIVITY LIMITATIONS standing, squatting, stairs, and locomotion level   PARTICIPATION LIMITATIONS: cleaning, shopping, and community activity   PERSONAL FACTORS Age, Fitness, and 1 comorbidity: family history of arthritis  are also affecting patient's functional outcome.  REHAB POTENTIAL: Good   CLINICAL DECISION MAKING: Stable/uncomplicated   EVALUATION COMPLEXITY: Low     GOALS: Goals reviewed with patient? No   SHORT TERM GOALS: Target date: 03/07/2022  Pt will be independent with HEP in order to improve strength and balance in order to decrease fall risk and improve function at home and work. Baseline: Performing independently  Goal status: Achieved    2.  Patient will ambulate without donning surgical boot as evidenced of tissue healing and improvement in ankle stability.  Baseline: Wearing surgical boot  Goal status: Achieved    3.  Pt will increase 6MWT by at least 64m(1691f in order to demonstrate clinically significant improvement in cardiopulmonary endurance and community  ambulation Baseline: 900 ft 04/01/22: 850 ft 04/18/22: 1,000 ft  Goal status: Achieved        LONG TERM GOALS: Target date: 05/02/2022    Patient will have improved function and activity level as evidenced by an increase in FOTO score by 10 points or more.  Baseline: 47/100 with target of 60  04/01/22: 49 04/18/22:54  Goal status: Partially Met    2.  Patient will improve RLE strength to be symmetrical with LLE for improved gait mechanics and weight bearing without use of AD.  Baseline: PF R/L 2, NT for hip strength   04/01/22: PF R/L 5/5 Goal status: Achieved    3.  Patient will negotiate a flight of stairs as evidence of improved mobility and improved surgical healing  Baseline: NT    04/01/22: Able to negotiate stairs with use of UE support and step to pattern  Goal status: Achieved    4.  Patient will ambulate 1,000 ft during 21m35mas evidence that her left foot is healing and she is capable of ambulating community level distances.  Baseline: 900 ft 04/01/22: 850 ft  04/18/22: 1,000 ft  Goal status: Achieved        PLAN:  PT FREQUENCY: 1-2x/week   PT DURATION: 10 weeks   PLANNED INTERVENTIONS: Therapeutic exercises, Neuromuscular re-education, Balance training, Gait training, Patient/Family education, Self Care, Joint mobilization, Joint manipulation, Stair training, DME instructions, Aquatic Therapy, Electrical stimulation, Cryotherapy, Moist heat, scar mobilization, Manual therapy, and Re-evaluation   PLAN FOR NEXT SESSION: Discharge from PT Arnold, DPT  04/18/2022, 9:44 AM

## 2022-05-06 DIAGNOSIS — Z09 Encounter for follow-up examination after completed treatment for conditions other than malignant neoplasm: Secondary | ICD-10-CM | POA: Diagnosis not present

## 2022-05-06 DIAGNOSIS — M19071 Primary osteoarthritis, right ankle and foot: Secondary | ICD-10-CM | POA: Diagnosis not present

## 2022-05-06 DIAGNOSIS — M216X2 Other acquired deformities of left foot: Secondary | ICD-10-CM | POA: Diagnosis not present

## 2022-05-06 DIAGNOSIS — M216X1 Other acquired deformities of right foot: Secondary | ICD-10-CM | POA: Diagnosis not present

## 2022-05-06 DIAGNOSIS — Q66222 Congenital metatarsus adductus, left foot: Secondary | ICD-10-CM | POA: Diagnosis not present

## 2022-05-06 DIAGNOSIS — M810 Age-related osteoporosis without current pathological fracture: Secondary | ICD-10-CM | POA: Diagnosis not present

## 2022-05-06 DIAGNOSIS — M2041 Other hammer toe(s) (acquired), right foot: Secondary | ICD-10-CM | POA: Diagnosis not present

## 2022-05-06 DIAGNOSIS — M2042 Other hammer toe(s) (acquired), left foot: Secondary | ICD-10-CM | POA: Diagnosis not present

## 2022-05-06 DIAGNOSIS — I872 Venous insufficiency (chronic) (peripheral): Secondary | ICD-10-CM | POA: Diagnosis not present

## 2022-05-06 DIAGNOSIS — M76821 Posterior tibial tendinitis, right leg: Secondary | ICD-10-CM | POA: Diagnosis not present

## 2022-05-06 DIAGNOSIS — M2142 Flat foot [pes planus] (acquired), left foot: Secondary | ICD-10-CM | POA: Diagnosis not present

## 2022-05-06 DIAGNOSIS — M2141 Flat foot [pes planus] (acquired), right foot: Secondary | ICD-10-CM | POA: Diagnosis not present

## 2022-05-13 DIAGNOSIS — H40003 Preglaucoma, unspecified, bilateral: Secondary | ICD-10-CM | POA: Diagnosis not present

## 2022-05-15 ENCOUNTER — Ambulatory Visit: Payer: PPO | Admitting: Dermatology

## 2022-05-15 VITALS — BP 141/81

## 2022-05-15 DIAGNOSIS — D485 Neoplasm of uncertain behavior of skin: Secondary | ICD-10-CM

## 2022-05-15 DIAGNOSIS — L821 Other seborrheic keratosis: Secondary | ICD-10-CM

## 2022-05-15 DIAGNOSIS — L814 Other melanin hyperpigmentation: Secondary | ICD-10-CM

## 2022-05-15 DIAGNOSIS — L820 Inflamed seborrheic keratosis: Secondary | ICD-10-CM | POA: Diagnosis not present

## 2022-05-15 DIAGNOSIS — D2239 Melanocytic nevi of other parts of face: Secondary | ICD-10-CM

## 2022-05-15 NOTE — Patient Instructions (Addendum)
Cryotherapy Aftercare  Wash gently with soap and water everyday.   Apply Vaseline and Band-Aid daily until healed.  Wound Care Instructions  Cleanse wound gently with soap and water once a day then pat dry with clean gauze. Apply a thin coat of Petrolatum (petroleum jelly, "Vaseline") over the wound (unless you have an allergy to this). We recommend that you use a new, sterile tube of Vaseline. Do not pick or remove scabs. Do not remove the yellow or white "healing tissue" from the base of the wound.  Cover the wound with fresh, clean, nonstick gauze and secure with paper tape. You may use Band-Aids in place of gauze and tape if the wound is small enough, but would recommend trimming much of the tape off as there is often too much. Sometimes Band-Aids can irritate the skin.  You should call the office for your biopsy report after 1 week if you have not already been contacted.  If you experience any problems, such as abnormal amounts of bleeding, swelling, significant bruising, significant pain, or evidence of infection, please call the office immediately.  FOR ADULT SURGERY PATIENTS: If you need something for pain relief you may take 1 extra strength Tylenol (acetaminophen) AND 2 Ibuprofen (200mg each) together every 4 hours as needed for pain. (do not take these if you are allergic to them or if you have a reason you should not take them.) Typically, you may only need pain medication for 1 to 3 days.      Due to recent changes in healthcare laws, you may see results of your pathology and/or laboratory studies on MyChart before the doctors have had a chance to review them. We understand that in some cases there may be results that are confusing or concerning to you. Please understand that not all results are received at the same time and often the doctors may need to interpret multiple results in order to provide you with the best plan of care or course of treatment. Therefore, we ask that you  please give us 2 business days to thoroughly review all your results before contacting the office for clarification. Should we see a critical lab result, you will be contacted sooner.   If You Need Anything After Your Visit  If you have any questions or concerns for your doctor, please call our main line at [phone] and press option 4 to reach your doctor's medical assistant. If no one answers, please leave a voicemail as directed and we will return your call as soon as possible. Messages left after 4 pm will be answered the following business day.   You may also send us a message via MyChart. We typically respond to MyChart messages within 1-2 business days.  For prescription refills, please ask your pharmacy to contact our office. Our fax number is [phone].  If you have an urgent issue when the clinic is closed that cannot wait until the next business day, you can page your doctor at the number below.    Please note that while we do our best to be available for urgent issues outside of office hours, we are not available 24/7.   If you have an urgent issue and are unable to reach us, you may choose to seek medical care at your doctor's office, retail clinic, urgent care center, or emergency room.  If you have a medical emergency, please immediately call 911 or go to the emergency department.  Pager Numbers  - Dr. Kowalski: [phone]  -   Dr. Moye: [phone]  - Dr. Stewart: [phone]  In the event of inclement weather, please call our main line at [phone] for an update on the status of any delays or closures.  Dermatology Medication Tips: Please keep the boxes that topical medications come in in order to help keep track of the instructions about where and how to use these. Pharmacies typically print the medication instructions only on the boxes and not directly on the medication tubes.   If your medication is too expensive, please contact our office at  [phone] option 4 or send us a message through MyChart.   We are unable to tell what your co-pay for medications will be in advance as this is different depending on your insurance coverage. However, we may be able to find a substitute medication at lower cost or fill out paperwork to get insurance to cover a needed medication.   If a prior authorization is required to get your medication covered by your insurance company, please allow us 1-2 business days to complete this process.  Drug prices often vary depending on where the prescription is filled and some pharmacies may offer cheaper prices.  The website www.goodrx.com contains coupons for medications through different pharmacies. The prices here do not account for what the cost may be with help from insurance (it may be cheaper with your insurance), but the website can give you the price if you did not use any insurance.  - You can print the associated coupon and take it with your prescription to the pharmacy.  - You may also stop by our office during regular business hours and pick up a GoodRx coupon card.  - If you need your prescription sent electronically to a different pharmacy, notify our office through [facility] MyChart or by phone at [phone] option 4.     Si Usted Necesita Algo Despus de Su Visita  Tambin puede enviarnos un mensaje a travs de MyChart. Por lo general respondemos a los mensajes de MyChart en el transcurso de 1 a 2 das hbiles.  Para renovar recetas, por favor pida a su farmacia que se ponga en contacto con nuestra oficina. Nuestro nmero de fax es el [phone].  Si tiene un asunto urgente cuando la clnica est cerrada y que no puede esperar hasta el siguiente da hbil, puede llamar/localizar a su doctor(a) al nmero que aparece a continuacin.   Por favor, tenga en cuenta que aunque hacemos todo lo posible para estar disponibles para asuntos urgentes fuera del horario de oficina, no estamos  disponibles las 24 horas del da, los 7 das de la semana.   Si tiene un problema urgente y no puede comunicarse con nosotros, puede optar por buscar atencin mdica  en el consultorio de su doctor(a), en una clnica privada, en un centro de atencin urgente o en una sala de emergencias.  Si tiene una emergencia mdica, por favor llame inmediatamente al 911 o vaya a la sala de emergencias.  Nmeros de bper  - Dr. Kowalski: [phone]  - Dra. Moye: [phone]  - Dra. Stewart: [phone]  En caso de inclemencias del tiempo, por favor llame a nuestra lnea principal al [phone] para una actualizacin sobre el estado de cualquier retraso o cierre.  Consejos para la medicacin en dermatologa: Por favor, guarde las cajas en las que vienen los medicamentos de uso tpico para ayudarle a seguir las instrucciones sobre dnde y cmo usarlos. Las farmacias generalmente imprimen las instrucciones del medicamento slo en las cajas y   no directamente en los tubos del medicamento.   Si su medicamento es muy caro, por favor, pngase en contacto con nuestra oficina llamando al [phone] y presione la opcin 4 o envenos un mensaje a travs de MyChart.   No podemos decirle cul ser su copago por los medicamentos por adelantado ya que esto es diferente dependiendo de la cobertura de su seguro. Sin embargo, es posible que podamos encontrar un medicamento sustituto a menor costo o llenar un formulario para que el seguro cubra el medicamento que se considera necesario.   Si se requiere una autorizacin previa para que su compaa de seguros cubra su medicamento, por favor permtanos de 1 a 2 das hbiles para completar este proceso.  Los precios de los medicamentos varan con frecuencia dependiendo del lugar de dnde se surte la receta y alguna farmacias pueden ofrecer precios ms baratos.  El sitio web www.goodrx.com tiene cupones para medicamentos de diferentes farmacias. Los precios aqu no  tienen en cuenta lo que podra costar con la ayuda del seguro (puede ser ms barato con su seguro), pero el sitio web puede darle el precio si no utiliz ningn seguro.  - Puede imprimir el cupn correspondiente y llevarlo con su receta a la farmacia.  - Tambin puede pasar por nuestra oficina durante el horario de atencin regular y recoger una tarjeta de cupones de GoodRx.  - Si necesita que su receta se enve electrnicamente a una farmacia diferente, informe a nuestra oficina a travs de MyChart de [facility] o por telfono llamando al [phone] y presione la opcin 4.  

## 2022-05-15 NOTE — Progress Notes (Signed)
   Follow-Up Visit   Subjective  NEHAL WITTING is a 76 y.o. female who presents for the following: Painful spot on right lower back (Noticed Sunday. Sharp pain in this area at times. No rash present. No pain radiating around side. She is concerned this could be early shingles. ) and Nevus (Chin, bothersome to patient, wants removed. ).   The following portions of the chart were reviewed this encounter and updated as appropriate:       Review of Systems:  No other skin or systemic complaints except as noted in HPI or Assessment and Plan.  Objective  Well appearing patient in no apparent distress; mood and affect are within normal limits.  A focused examination was performed including face, back. Relevant physical exam findings are noted in the Assessment and Plan.  Right Lower Back Erythematous stuck-on, waxy papule   Left Chin 3.0 mm firm flesh white papule    Assessment & Plan      Inflamed seborrheic keratosis Right Lower Back  Symptomatic, irritating, patient would like treated.  No rash present today. No pain radiating around side to abdomen. If patient develops rash/bumps associated with pain, will send in Rx for possible shingles.   Destruction of lesion - Right Lower Back  Destruction method: cryotherapy   Informed consent: discussed and consent obtained   Lesion destroyed using liquid nitrogen: Yes   Region frozen until ice ball extended beyond lesion: Yes   Outcome: patient tolerated procedure well with no complications   Post-procedure details: wound care instructions given   Additional details:  Prior to procedure, discussed risks of blister formation, small wound, skin dyspigmentation, or rare scar following cryotherapy. Recommend Vaseline ointment to treated areas while healing.   Neoplasm of uncertain behavior of skin Left Chin  Epidermal / dermal shaving  Lesion diameter (cm):  0.3 Informed consent: discussed and consent obtained   Patient was  prepped and draped in usual sterile fashion: Area prepped with alcohol. Anesthesia: the lesion was anesthetized in a standard fashion   Anesthetic:  1% lidocaine w/ epinephrine 1-100,000 buffered w/ 8.4% NaHCO3 Instrument used: flexible razor blade   Hemostasis achieved with: pressure, aluminum chloride and electrodesiccation   Outcome: patient tolerated procedure well   Post-procedure details: wound care instructions given   Post-procedure details comment:  Ointment and small bandage applied  Specimen 1 - Surgical pathology Differential Diagnosis: Irritated Nevus vs other Check Margins: No  Seborrheic Keratoses- L upper eyelid - Stuck-on, waxy, tan-brown papules and/or plaques  - Benign-appearing - Discussed benign etiology and prognosis. - Observe, Discussed cryotherapy if spot(s) become irritated or inflamed.  - Call for any changes  Lentigines - Scattered tan macules face - Due to sun exposure - Benign-appearing, observe - Recommend daily broad spectrum sunscreen SPF 30+ to sun-exposed areas, reapply every 2 hours as needed. - Call for any changes     Return if symptoms worsen or fail to improve.  IJamesetta Orleans, CMA, am acting as scribe for Brendolyn Patty, MD .  Documentation: I have reviewed the above documentation for accuracy and completeness, and I agree with the above.  Brendolyn Patty MD

## 2022-05-22 DIAGNOSIS — Z1231 Encounter for screening mammogram for malignant neoplasm of breast: Secondary | ICD-10-CM | POA: Diagnosis not present

## 2022-05-23 ENCOUNTER — Encounter: Payer: Self-pay | Admitting: Obstetrics and Gynecology

## 2022-05-27 ENCOUNTER — Telehealth: Payer: Self-pay

## 2022-05-27 NOTE — Telephone Encounter (Signed)
Advised patient biopsy of the left chin was a benign nevus. No further treatment needed.

## 2022-05-27 NOTE — Telephone Encounter (Signed)
-----   Message from Brendolyn Patty, MD sent at 05/27/2022  8:39 AM EST ----- Skin , left chin MELANOCYTIC NEVUS, INTRADERMAL TYPE, IRRITATED  Benign irritated mole - please call patient

## 2022-06-14 DIAGNOSIS — Q66222 Congenital metatarsus adductus, left foot: Secondary | ICD-10-CM | POA: Diagnosis not present

## 2022-06-14 DIAGNOSIS — M2041 Other hammer toe(s) (acquired), right foot: Secondary | ICD-10-CM | POA: Diagnosis not present

## 2022-06-14 DIAGNOSIS — M2042 Other hammer toe(s) (acquired), left foot: Secondary | ICD-10-CM | POA: Diagnosis not present

## 2022-06-14 DIAGNOSIS — M216X1 Other acquired deformities of right foot: Secondary | ICD-10-CM | POA: Diagnosis not present

## 2022-06-14 DIAGNOSIS — I872 Venous insufficiency (chronic) (peripheral): Secondary | ICD-10-CM | POA: Diagnosis not present

## 2022-06-14 DIAGNOSIS — M25571 Pain in right ankle and joints of right foot: Secondary | ICD-10-CM | POA: Diagnosis not present

## 2022-06-14 DIAGNOSIS — M810 Age-related osteoporosis without current pathological fracture: Secondary | ICD-10-CM | POA: Diagnosis not present

## 2022-06-14 DIAGNOSIS — M2142 Flat foot [pes planus] (acquired), left foot: Secondary | ICD-10-CM | POA: Diagnosis not present

## 2022-06-14 DIAGNOSIS — M216X2 Other acquired deformities of left foot: Secondary | ICD-10-CM | POA: Diagnosis not present

## 2022-06-14 DIAGNOSIS — M79671 Pain in right foot: Secondary | ICD-10-CM | POA: Diagnosis not present

## 2022-06-14 DIAGNOSIS — M2141 Flat foot [pes planus] (acquired), right foot: Secondary | ICD-10-CM | POA: Diagnosis not present

## 2022-06-14 DIAGNOSIS — G8929 Other chronic pain: Secondary | ICD-10-CM | POA: Diagnosis not present

## 2022-07-22 DIAGNOSIS — M810 Age-related osteoporosis without current pathological fracture: Secondary | ICD-10-CM | POA: Diagnosis not present

## 2022-07-22 DIAGNOSIS — M2042 Other hammer toe(s) (acquired), left foot: Secondary | ICD-10-CM | POA: Diagnosis not present

## 2022-07-22 DIAGNOSIS — M216X1 Other acquired deformities of right foot: Secondary | ICD-10-CM | POA: Diagnosis not present

## 2022-07-22 DIAGNOSIS — M19071 Primary osteoarthritis, right ankle and foot: Secondary | ICD-10-CM | POA: Diagnosis not present

## 2022-07-22 DIAGNOSIS — M79671 Pain in right foot: Secondary | ICD-10-CM | POA: Diagnosis not present

## 2022-07-22 DIAGNOSIS — M2041 Other hammer toe(s) (acquired), right foot: Secondary | ICD-10-CM | POA: Diagnosis not present

## 2022-07-22 DIAGNOSIS — M9689 Other intraoperative and postprocedural complications and disorders of the musculoskeletal system: Secondary | ICD-10-CM | POA: Diagnosis not present

## 2022-07-22 DIAGNOSIS — I872 Venous insufficiency (chronic) (peripheral): Secondary | ICD-10-CM | POA: Diagnosis not present

## 2022-07-22 DIAGNOSIS — M21621 Bunionette of right foot: Secondary | ICD-10-CM | POA: Diagnosis not present

## 2022-07-22 DIAGNOSIS — M2141 Flat foot [pes planus] (acquired), right foot: Secondary | ICD-10-CM | POA: Diagnosis not present

## 2022-07-22 DIAGNOSIS — M216X2 Other acquired deformities of left foot: Secondary | ICD-10-CM | POA: Diagnosis not present

## 2022-07-22 DIAGNOSIS — E669 Obesity, unspecified: Secondary | ICD-10-CM | POA: Diagnosis not present

## 2022-07-22 DIAGNOSIS — M2142 Flat foot [pes planus] (acquired), left foot: Secondary | ICD-10-CM | POA: Diagnosis not present

## 2022-07-26 DIAGNOSIS — E7800 Pure hypercholesterolemia, unspecified: Secondary | ICD-10-CM | POA: Diagnosis not present

## 2022-07-26 DIAGNOSIS — Z79899 Other long term (current) drug therapy: Secondary | ICD-10-CM | POA: Diagnosis not present

## 2022-07-26 DIAGNOSIS — R739 Hyperglycemia, unspecified: Secondary | ICD-10-CM | POA: Diagnosis not present

## 2022-08-01 DIAGNOSIS — K219 Gastro-esophageal reflux disease without esophagitis: Secondary | ICD-10-CM | POA: Diagnosis not present

## 2022-08-01 DIAGNOSIS — G2581 Restless legs syndrome: Secondary | ICD-10-CM | POA: Diagnosis not present

## 2022-08-01 DIAGNOSIS — E7800 Pure hypercholesterolemia, unspecified: Secondary | ICD-10-CM | POA: Diagnosis not present

## 2022-08-01 DIAGNOSIS — I10 Essential (primary) hypertension: Secondary | ICD-10-CM | POA: Diagnosis not present

## 2022-08-01 DIAGNOSIS — R7303 Prediabetes: Secondary | ICD-10-CM | POA: Diagnosis not present

## 2022-08-01 DIAGNOSIS — Z79899 Other long term (current) drug therapy: Secondary | ICD-10-CM | POA: Diagnosis not present

## 2022-08-01 DIAGNOSIS — F320 Major depressive disorder, single episode, mild: Secondary | ICD-10-CM | POA: Diagnosis not present

## 2022-08-08 DIAGNOSIS — Z96653 Presence of artificial knee joint, bilateral: Secondary | ICD-10-CM | POA: Diagnosis not present

## 2022-08-08 DIAGNOSIS — Z96643 Presence of artificial hip joint, bilateral: Secondary | ICD-10-CM | POA: Diagnosis not present

## 2022-08-22 DIAGNOSIS — I872 Venous insufficiency (chronic) (peripheral): Secondary | ICD-10-CM | POA: Diagnosis not present

## 2022-10-07 DIAGNOSIS — G8929 Other chronic pain: Secondary | ICD-10-CM | POA: Diagnosis not present

## 2022-10-07 DIAGNOSIS — M9689 Other intraoperative and postprocedural complications and disorders of the musculoskeletal system: Secondary | ICD-10-CM | POA: Diagnosis not present

## 2022-10-07 DIAGNOSIS — I872 Venous insufficiency (chronic) (peripheral): Secondary | ICD-10-CM | POA: Diagnosis not present

## 2022-10-07 DIAGNOSIS — M2142 Flat foot [pes planus] (acquired), left foot: Secondary | ICD-10-CM | POA: Diagnosis not present

## 2022-10-07 DIAGNOSIS — M810 Age-related osteoporosis without current pathological fracture: Secondary | ICD-10-CM | POA: Diagnosis not present

## 2022-10-07 DIAGNOSIS — M19071 Primary osteoarthritis, right ankle and foot: Secondary | ICD-10-CM | POA: Diagnosis not present

## 2022-10-07 DIAGNOSIS — E669 Obesity, unspecified: Secondary | ICD-10-CM | POA: Diagnosis not present

## 2022-10-07 DIAGNOSIS — M2041 Other hammer toe(s) (acquired), right foot: Secondary | ICD-10-CM | POA: Diagnosis not present

## 2022-10-07 DIAGNOSIS — M2141 Flat foot [pes planus] (acquired), right foot: Secondary | ICD-10-CM | POA: Diagnosis not present

## 2022-10-07 DIAGNOSIS — M79671 Pain in right foot: Secondary | ICD-10-CM | POA: Diagnosis not present

## 2022-10-07 DIAGNOSIS — I890 Lymphedema, not elsewhere classified: Secondary | ICD-10-CM | POA: Diagnosis not present

## 2022-10-07 DIAGNOSIS — Q66222 Congenital metatarsus adductus, left foot: Secondary | ICD-10-CM | POA: Diagnosis not present

## 2022-10-07 DIAGNOSIS — M216X1 Other acquired deformities of right foot: Secondary | ICD-10-CM | POA: Diagnosis not present

## 2022-11-25 DIAGNOSIS — M9689 Other intraoperative and postprocedural complications and disorders of the musculoskeletal system: Secondary | ICD-10-CM | POA: Diagnosis not present

## 2022-11-25 DIAGNOSIS — M2141 Flat foot [pes planus] (acquired), right foot: Secondary | ICD-10-CM | POA: Diagnosis not present

## 2022-11-25 DIAGNOSIS — M19071 Primary osteoarthritis, right ankle and foot: Secondary | ICD-10-CM | POA: Diagnosis not present

## 2022-11-25 DIAGNOSIS — M2142 Flat foot [pes planus] (acquired), left foot: Secondary | ICD-10-CM | POA: Diagnosis not present

## 2022-11-25 DIAGNOSIS — M216X2 Other acquired deformities of left foot: Secondary | ICD-10-CM | POA: Diagnosis not present

## 2022-11-25 DIAGNOSIS — M2041 Other hammer toe(s) (acquired), right foot: Secondary | ICD-10-CM | POA: Diagnosis not present

## 2022-11-25 DIAGNOSIS — M216X1 Other acquired deformities of right foot: Secondary | ICD-10-CM | POA: Diagnosis not present

## 2022-11-25 DIAGNOSIS — M79671 Pain in right foot: Secondary | ICD-10-CM | POA: Diagnosis not present

## 2022-11-25 DIAGNOSIS — L608 Other nail disorders: Secondary | ICD-10-CM | POA: Diagnosis not present

## 2022-11-25 DIAGNOSIS — M810 Age-related osteoporosis without current pathological fracture: Secondary | ICD-10-CM | POA: Diagnosis not present

## 2022-11-25 DIAGNOSIS — Q66222 Congenital metatarsus adductus, left foot: Secondary | ICD-10-CM | POA: Diagnosis not present

## 2022-11-25 DIAGNOSIS — I872 Venous insufficiency (chronic) (peripheral): Secondary | ICD-10-CM | POA: Diagnosis not present

## 2022-11-25 DIAGNOSIS — A499 Bacterial infection, unspecified: Secondary | ICD-10-CM | POA: Diagnosis not present

## 2022-11-25 DIAGNOSIS — B351 Tinea unguium: Secondary | ICD-10-CM | POA: Diagnosis not present

## 2022-11-25 DIAGNOSIS — I890 Lymphedema, not elsewhere classified: Secondary | ICD-10-CM | POA: Diagnosis not present

## 2022-12-04 DIAGNOSIS — H524 Presbyopia: Secondary | ICD-10-CM | POA: Diagnosis not present

## 2022-12-04 DIAGNOSIS — H5203 Hypermetropia, bilateral: Secondary | ICD-10-CM | POA: Diagnosis not present

## 2022-12-04 DIAGNOSIS — Z961 Presence of intraocular lens: Secondary | ICD-10-CM | POA: Diagnosis not present

## 2022-12-04 DIAGNOSIS — H40022 Open angle with borderline findings, high risk, left eye: Secondary | ICD-10-CM | POA: Diagnosis not present

## 2022-12-04 DIAGNOSIS — H52223 Regular astigmatism, bilateral: Secondary | ICD-10-CM | POA: Diagnosis not present

## 2022-12-06 ENCOUNTER — Ambulatory Visit (INDEPENDENT_AMBULATORY_CARE_PROVIDER_SITE_OTHER): Payer: PPO | Admitting: Vascular Surgery

## 2022-12-06 ENCOUNTER — Encounter (INDEPENDENT_AMBULATORY_CARE_PROVIDER_SITE_OTHER): Payer: Self-pay | Admitting: Vascular Surgery

## 2022-12-06 VITALS — BP 117/70 | HR 64 | Resp 16 | Wt 194.6 lb

## 2022-12-06 DIAGNOSIS — I10 Essential (primary) hypertension: Secondary | ICD-10-CM

## 2022-12-06 DIAGNOSIS — I83893 Varicose veins of bilateral lower extremities with other complications: Secondary | ICD-10-CM

## 2022-12-06 DIAGNOSIS — I890 Lymphedema, not elsewhere classified: Secondary | ICD-10-CM | POA: Diagnosis not present

## 2022-12-06 NOTE — Assessment & Plan Note (Signed)
The patient has marked lower extremity swelling after extensive orthopedic and podiatry surgery.  Both lower extremities are affected but the right is little worse than the left.  She is almost certainly developed lymphedema from chronic scarring of lymphatic channels with prominent swelling.  This would be at least stage II lymphedema as the patient has been wearing compression socks reliably for many months now.  She elevates her legs is much as possible.  Her ambulation has been improving but obviously with her major foot surgery earlier this year and is quite limited.  Were going to get a venous reflux study to evaluate for significant venous disease.  I think she would clearly benefit from a lymphedema pump due to continued symptoms with refractory and severe swelling despite the use of appropriate conservative therapies.  She has developed hyperpigmentation on her skin and severe chronic swelling.  We will see her back following her venous reflux study in the near future to discuss the results and determine further treatment options.

## 2022-12-06 NOTE — Assessment & Plan Note (Signed)
Although I think the majority of her swelling is likely from lymphedema, venous reflux study will be done to assess for significant venous disease as a contributor.

## 2022-12-06 NOTE — Progress Notes (Signed)
Patient ID: STOREE VETTEL, female   DOB: 1945/12/14, 77 y.o.   MRN: [id number]  Chief Complaint  Patient presents with   New Patient (Initial Visit)    Ref Excell Seltzer consult [place] lymphedema    HPI Claudia Terry is a 77 y.o. female.  I am asked to see the patient by Dr. Excell Seltzer for evaluation of leg swelling.  She has no history of DVT or superficial thrombophlebitis to her knowledge.  Both legs have swelling but the right may be a little worse than the left.  This is dramatically worsened over the past few years.  She has had extensive arthritis issues and has had bilateral hip and knee replacements.  Earlier this year, she underwent an extensive right foot surgery by Dr. Excell Seltzer which required essentially 14 weeks of no ambulation.  Her swelling is gradually worsened over time and her legs are now very heavy and it makes it more difficult to walk.  She denies any open wounds or infection.  No fevers or chills.  No chest pain or shortness of breath.     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 Markham Jordan   History of 2019 novel coronavirus disease (COVID-19) 01/15/2021   Hypertension    Low back pain 04/1994   Menopause    Osteoarthritis    Restless leg syndrome 1999    Past Surgical History:  Procedure Laterality Date   ACHILLES TENDON SURGERY Right 11/16/2021   Procedure: PTAL - ACHILLES TENDON MICROTENOTOMY;  Surgeon: Rosetta Posner, DPM;  Location: [facility];  Service: Podiatry;  Laterality: Right;   ARTHRODESIS METATARSAL Right 11/16/2021   Procedure: ARTHRODESIS METATARSAL, T-N, C-C OR MET-CUNNEIFORM, NCJ;  Surgeon: Rosetta Posner, DPM;  Location: [facility];  Service: Podiatry;  Laterality: Right;   CATARACT EXTRACTION     CATARACT EXTRACTION W/PHACO Left 08/15/2020   Procedure: CATARACT EXTRACTION PHACO AND INTRAOCULAR LENS PLACEMENT (IOC) LEFT  6.60 00:39.8;  Surgeon: Galen Manila, MD;  Location: [facility];  Service: Ophthalmology;  Laterality: Left;   CATARACT EXTRACTION W/PHACO Right 08/29/2020   Procedure: CATARACT EXTRACTION PHACO AND INTRAOCULAR LENS PLACEMENT (IOC) RIGHT 6.94 00:42.3;  Surgeon: Galen Manila, MD;  Location: [facility];  Service: Ophthalmology;  Laterality: Right;   CESAREAN SECTION  1972, 1975   X2   COLONOSCOPY  12/06/2011   normal   COLONOSCOPY WITH PROPOFOL N/A 04/13/2018   Procedure: COLONOSCOPY WITH PROPOFOL;  Surgeon: Scot Jun, MD;  Location: [facility];  Service: Endoscopy;  Laterality: N/A;   ESOPHAGOGASTRODUODENOSCOPY (EGD) WITH PROPOFOL N/A 02/23/2015   Procedure: ESOPHAGOGASTRODUODENOSCOPY (EGD) WITH PROPOFOL;  Surgeon: Scot Jun, MD;  Location: [facility];  Service: Endoscopy;  Laterality: N/A;   ESOPHAGOGASTRODUODENOSCOPY (EGD) WITH PROPOFOL N/A 04/13/2018   Procedure: ESOPHAGOGASTRODUODENOSCOPY (EGD) WITH PROPOFOL;  Surgeon: Scot Jun, MD;  Location: [facility];  Service: Endoscopy;  Laterality: N/A;   FOOT ARTHRODESIS Right 11/16/2021   Procedure: ARTHRODESIS FOOT; TRIPLE;  Surgeon: Rosetta Posner, DPM;  Location: [facility];  Service: Podiatry;  Laterality: Right;   FOOT SURGERY     with screws   HARDWARE REMOVAL Right 11/16/2021   Procedure: HARDWARE REMOVAL;  Surgeon: Rosetta Posner, DPM;  Location: [facility];  Service: Podiatry;  Laterality: Right;   HIP CLOSED REDUCTION Left 08/08/2015   Procedure: CLOSED REDUCTION HIP;  Surgeon: Donato Heinz, MD;  Location: [facility];  Service: Orthopedics;  Laterality: Left;   HIP CLOSED REDUCTION Left 09/18/2015   Procedure: CLOSED REDUCTION HIP;  Surgeon: Kennedy Bucker, MD;  Location: [facility];  Service: Orthopedics;  Laterality: Left;   HIP CLOSED REDUCTION Left 12/01/2015   Procedure: CLOSED REDUCTION HIP;  Surgeon: Donato Heinz, MD;  Location: [facility];  Service: Orthopedics;  Laterality:  Left;  no prep no incision   JOINT REPLACEMENT  10/2016   left knee   KNEE ARTHROPLASTY Left 10/28/2016   Procedure: COMPUTER ASSISTED TOTAL KNEE ARTHROPLASTY;  Surgeon: Donato Heinz, MD;  Location: [facility];  Service: Orthopedics;  Laterality: Left;   KNEE ARTHROPLASTY Right 07/07/2017   Procedure: COMPUTER ASSISTED TOTAL KNEE ARTHROPLASTY;  Surgeon: Donato Heinz, MD;  Location: [facility];  Service: Orthopedics;  Laterality: Right;   KNEE ARTHROSCOPY Right 11/14/2014   Procedure: ARTHROSCOPY KNEE;  Surgeon: Donato Heinz, MD;  Location: [facility];  Service: Orthopedics;  Laterality: Right;  Posterior horn menicus, anterior lateral menicus grade 3 chondromalacia   phlebitis groin     right foot surgery     11/2021   SHOULDER ARTHROSCOPY WITH OPEN ROTATOR CUFF REPAIR Left 03/10/2019   Procedure: LEFT SHOULDER ARTHROSCOPY, ARTHROSCOPIC ROTATOR CUFF REPAIR, DISTAL CLAVICLE EXCISION, SUBACROMIAL DECOMPRESSION, INTRA-ARTICULAR DEBRIDEMENT ;  Surgeon: Lyndle Herrlich, MD;  Location: [facility];  Service: Orthopedics;  Laterality: Left;   SHOULDER ARTHROSCOPY WITH ROTATOR CUFF REPAIR Left 06/21/2019   Procedure: SHOULDER ARTHROSCOPY WITH EXTENSIVE INTRAARTICULAR DEBRIDEMENT WITH REMOVAL OF HARDWARE;  Surgeon: Lyndle Herrlich, MD;  Location: [facility];  Service: Orthopedics;  Laterality: Left;   TONSILLECTOMY     TOTAL HIP ARTHROPLASTY Left 05/08/2011   necrosis of hip   TOTAL HIP ARTHROPLASTY Right 06/14/2015   Procedure: TOTAL HIP ARTHROPLASTY;  Surgeon: Donato Heinz, MD;  Location: [facility];  Service: Orthopedics;  Laterality: Right;   TOTAL HIP REVISION Left 02/21/2016   Procedure: TOTAL HIP REVISION;  Surgeon: Donato Heinz, MD;  Location: [facility];  Service: Orthopedics;  Laterality: Left;     Family History  Problem Relation Age of Onset   Diabetes Mother    Osteoarthritis Mother    Diabetes Maternal Grandfather    Hypertension Maternal Grandfather    Ovarian cancer  Paternal Aunt    Cancer - Lung Paternal Uncle      Social History   Tobacco Use   Smoking status: Never   Smokeless tobacco: Never  Vaping Use   Vaping Use: Never used  Substance Use Topics   Alcohol use: No   Drug use: No     Allergies  Allergen Reactions   Penicillins Hives and Other (See Comments)    Has patient had a PCN reaction causing immediate rash, facial/tongue/throat swelling, SOB or lightheadedness with hypotension: No Has patient had a PCN reaction causing severe rash involving mucus membranes or skin necrosis: No Has patient had a PCN reaction that required hospitalization No Has patient had a PCN reaction occurring within the last 10 years: No If all of the above answers are "NO", then may proceed with Cephalosporin use. Other reaction(s): Unknown   Erythromycin Rash   Other Nausea Only    Mycins   Tetracyclines & Related Rash    Current Outpatient Medications  Medication Sig Dispense Refill   acetaminophen (TYLENOL) 500 MG tablet Take 1,000 mg by mouth every 6 (six) hours as needed.     atenolol (TENORMIN)  50 MG tablet daily.     Biotin 5000 MCG TABS Take 10,000 mcg by mouth daily.     Calcium Carbonate-Vitamin D (CALTRATE 600+D PO) Take by mouth 2 (two) times daily.     clindamycin (CLEOCIN) 300 MG capsule Take 300 mg by mouth daily. For dental     clobetasol cream (TEMOVATE) 0.05 % Apply to itchy bites QD-BID. Avoid the face, groin, and axilla. 60 g 0   Coenzyme Q10 (CO Q-10) 200 MG CAPS Take 100 mg by mouth daily.     Glucosamine HCl-MSM 1500-500 MG/30ML LIQD Take by mouth 2 (two) times daily.     Misc. Devices (COMMODE BEDSIDE) MISC 1 Device by Does not apply route as needed. 3:1 BSC 1 each 0   omeprazole (PRILOSEC) 20 MG capsule Take by mouth.     pramipexole (MIRAPEX) 1.5 MG tablet Take 1.5 mg by mouth at bedtime.      Red Yeast Rice 600 MG TABS Take 600 mg by mouth 2 (two) times daily.     traZODone (DESYREL) 50 MG tablet trazodone 50 mg tablet      venlafaxine (EFFEXOR) 75 MG tablet Take 75 mg by mouth 1 day or 1 dose.     Vitamin D, Ergocalciferol, (DRISDOL) 1.25 MG (50000 UNIT) CAPS capsule Take 50,000 Units by mouth every 7 (seven) days.     No current facility-administered medications for this visit.      REVIEW OF SYSTEMS (Negative unless checked)  Constitutional: [] Weight loss  [] Fever  [] Chills Cardiac: [] Chest pain   [] Chest pressure   [] Palpitations   [] Shortness of breath when laying flat   [] Shortness of breath at rest   [] Shortness of breath with exertion. Vascular:  [x] Pain in legs with walking   [] Pain in legs at rest   [] Pain in legs when laying flat   [] Claudication   [] Pain in feet when walking  [] Pain in feet at rest  [] Pain in feet when laying flat   [] History of DVT   [] Phlebitis   [x] Swelling in legs   [] Varicose veins   [] Non-healing ulcers Pulmonary:   [] Uses home oxygen   [] Productive cough   [] Hemoptysis   [] Wheeze  [] COPD   [] Asthma Neurologic:  [] Dizziness  [] Blackouts   [] Seizures   [] History of stroke   [] History of TIA  [] Aphasia   [] Temporary blindness   [] Dysphagia   [] Weakness or numbness in arms   [] Weakness or numbness in legs Musculoskeletal:  [x] Arthritis   [] Joint swelling   [x] Joint pain   [] Low back pain Hematologic:  [] Easy bruising  [] Easy bleeding   [] Hypercoagulable state   [] Anemic  [] Hepatitis Gastrointestinal:  [] Blood in stool   [] Vomiting blood  [x] Gastroesophageal reflux/heartburn   [] Abdominal pain Genitourinary:  [] Chronic kidney disease   [] Difficult urination  [] Frequent urination  [] Burning with urination   [] Hematuria Skin:  [] Rashes   [] Ulcers   [] Wounds Psychological:  [] History of anxiety   []  History of major depression.    Physical Exam BP 117/70 (BP Location: Right Arm)   Pulse 64   Resp 16   Wt 194 lb 9.6 oz (88.3 kg)   LMP 02/08/2005 (Approximate)   BMI 34.47 kg/m  Gen:  WD/WN, NAD.  Appears younger than stated age Head: [place]/AT, No temporalis wasting. Prominent  temp pulse not noted. Ear/Nose/Throat: Hearing grossly intact, nares w/o erythema or drainage, oropharynx w/o Erythema/Exudate Eyes: Conjunctiva clear, sclera non-icteric  Neck: trachea midline.  No JVD.  Pulmonary:  Good air movement, respirations  not labored, no use of accessory muscles  Cardiac: RRR, no JVD Vascular:  Vessel Right Left  Radial Palpable Palpable                                   Gastrointestinal:. No masses, surgical incisions, or scars. Musculoskeletal: M/S 5/5 throughout.  Extremities without ischemic changes.  No deformity or atrophy.  2+ right lower extremity edema, 1-2+ left lower extremity edema.  Mild to moderate venous stasis changes bilaterally with some hyperpigmentation and skin thickening. Neurologic: Sensation grossly intact in extremities.  Symmetrical.  Speech is fluent. Motor exam as listed above. Psychiatric: Judgment intact, Mood & affect appropriate for pt's clinical situation. Dermatologic: No rashes or ulcers noted.  No cellulitis or open wounds.    Radiology No results found.  Labs No results found for this or any previous visit (from the past 2160 hour(s)).  Assessment/Plan:  Benign essential HTN blood pressure control important in reducing the progression of atherosclerotic disease. On appropriate oral medications.   Varicose veins of both legs with edema Although I think the majority of her swelling is likely from lymphedema, venous reflux study will be done to assess for significant venous disease as a contributor.  Lymphedema The patient has marked lower extremity swelling after extensive orthopedic and podiatry surgery.  Both lower extremities are affected but the right is little worse than the left.  She is almost certainly developed lymphedema from chronic scarring of lymphatic channels with prominent swelling.  This would be at least stage II lymphedema as the patient has been wearing compression socks reliably for many  months now.  She elevates her legs is much as possible.  Her ambulation has been improving but obviously with her major foot surgery earlier this year and is quite limited.  Were going to get a venous reflux study to evaluate for significant venous disease.  I think she would clearly benefit from a lymphedema pump due to continued symptoms with refractory and severe swelling despite the use of appropriate conservative therapies.  She has developed hyperpigmentation on her skin and severe chronic swelling.  We will see her back following her venous reflux study in the near future to discuss the results and determine further treatment options.      Festus Barren 12/06/2022, 3:48 PM   This note was created with Dragon medical transcription system.  Any errors from dictation are unintentional.

## 2022-12-06 NOTE — Assessment & Plan Note (Signed)
blood pressure control important in reducing the progression of atherosclerotic disease. On appropriate oral medications.  

## 2022-12-31 ENCOUNTER — Ambulatory Visit (INDEPENDENT_AMBULATORY_CARE_PROVIDER_SITE_OTHER): Payer: Medicare HMO | Admitting: Vascular Surgery

## 2022-12-31 ENCOUNTER — Ambulatory Visit (INDEPENDENT_AMBULATORY_CARE_PROVIDER_SITE_OTHER): Payer: Medicare HMO

## 2022-12-31 DIAGNOSIS — I890 Lymphedema, not elsewhere classified: Secondary | ICD-10-CM | POA: Diagnosis not present

## 2022-12-31 DIAGNOSIS — I83893 Varicose veins of bilateral lower extremities with other complications: Secondary | ICD-10-CM | POA: Diagnosis not present

## 2023-01-17 ENCOUNTER — Ambulatory Visit (INDEPENDENT_AMBULATORY_CARE_PROVIDER_SITE_OTHER): Payer: Medicare HMO | Admitting: Vascular Surgery

## 2023-01-23 DIAGNOSIS — M21071 Valgus deformity, not elsewhere classified, right ankle: Secondary | ICD-10-CM | POA: Diagnosis not present

## 2023-01-24 DIAGNOSIS — E7800 Pure hypercholesterolemia, unspecified: Secondary | ICD-10-CM | POA: Diagnosis not present

## 2023-01-24 DIAGNOSIS — R7303 Prediabetes: Secondary | ICD-10-CM | POA: Diagnosis not present

## 2023-01-24 DIAGNOSIS — Z79899 Other long term (current) drug therapy: Secondary | ICD-10-CM | POA: Diagnosis not present

## 2023-01-28 ENCOUNTER — Ambulatory Visit (INDEPENDENT_AMBULATORY_CARE_PROVIDER_SITE_OTHER): Payer: PPO | Admitting: Vascular Surgery

## 2023-01-28 ENCOUNTER — Encounter (INDEPENDENT_AMBULATORY_CARE_PROVIDER_SITE_OTHER): Payer: Self-pay | Admitting: Vascular Surgery

## 2023-01-28 VITALS — BP 127/70 | HR 63 | Resp 16 | Wt 195.2 lb

## 2023-01-28 DIAGNOSIS — I83893 Varicose veins of bilateral lower extremities with other complications: Secondary | ICD-10-CM

## 2023-01-28 DIAGNOSIS — I10 Essential (primary) hypertension: Secondary | ICD-10-CM

## 2023-01-28 DIAGNOSIS — I890 Lymphedema, not elsewhere classified: Secondary | ICD-10-CM | POA: Diagnosis not present

## 2023-01-28 NOTE — Assessment & Plan Note (Signed)
Despite appropriate conservative therapies including 20-30 mmHg compression stockings, exercise, and increased leg elevation the patient continues to have symptoms of stage 2 lymphedema.  The patient has been doing these therapies for years, and she saw [place] last almost 2 months ago.  The symptoms are significant and bothersome on a daily basis. At this point, I think a lymphedema pump would be an excellent adjuvant therapy to try to improve their symptoms. I have discussed the reason and rationale for the lymphedema pump. This would be in addition to the conservative therapies and not to replace them.  The patient has stasis dermatitis with hyperpigmentation from her swelling and clearly needs a lymphedema pump in addition to her conservative therapies to avoid progression and possible ulceration and cellulitis.  We will plan to see her back in a few months in follow-up.

## 2023-01-28 NOTE — Progress Notes (Signed)
MRN : [id number]  Claudia Terry is a 77 y.o. (1945-07-08) female who presents with chief complaint of  Chief Complaint  Patient presents with   Follow-up    Ultrasound results  .    History of Present Illness: Patient returns today in follow up of Her leg swelling.  Despite appropriate use of compression socks, elevation, and increasing levels of activity over the past 2 months, her symptoms have continued to worsen.  Both legs are heavy and tired, and this makes ambulation and activity much more difficult.  She had a prolonged immobility after a major right foot surgery.  A previously performed venous duplex demonstrates no evidence of DVT, superficial thrombophlebitis, or significant venous reflux in either lower extremity.  Current Outpatient Medications  Medication Sig Dispense Refill   acetaminophen (TYLENOL) 500 MG tablet Take 1,000 mg by mouth every 6 (six) hours as needed.     atenolol (TENORMIN) 50 MG tablet daily.     Biotin 5000 MCG TABS Take 10,000 mcg by mouth daily.     Calcium Carbonate-Vitamin D (CALTRATE 600+D PO) Take by mouth 2 (two) times daily.     clindamycin (CLEOCIN) 300 MG capsule Take 300 mg by mouth daily. For dental     clobetasol cream (TEMOVATE) 0.05 % Apply to itchy bites QD-BID. Avoid the face, groin, and axilla. 60 g 0   Coenzyme Q10 (CO Q-10) 200 MG CAPS Take 100 mg by mouth daily.     Glucosamine HCl-MSM 1500-500 MG/30ML LIQD Take by mouth 2 (two) times daily.     Misc. Devices (COMMODE BEDSIDE) MISC 1 Device by Does not apply route as needed. 3:1 BSC 1 each 0   omeprazole (PRILOSEC) 20 MG capsule Take by mouth.     pramipexole (MIRAPEX) 1.5 MG tablet Take 1.5 mg by mouth at bedtime.      Red Yeast Rice 600 MG TABS Take 600 mg by mouth 2 (two) times daily.     traZODone (DESYREL) 50 MG tablet trazodone 50 mg tablet     venlafaxine (EFFEXOR) 75 MG tablet Take 75 mg by mouth 1 day or 1 dose.     Vitamin D, Ergocalciferol, (DRISDOL) 1.25 MG (50000 UNIT)  CAPS capsule Take 50,000 Units by mouth every 7 (seven) days.     No current facility-administered medications for this visit.    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 Markham Jordan   History of 2019 novel coronavirus disease (COVID-19) 01/15/2021   Hypertension    Low back pain 04/1994   Menopause    Osteoarthritis    Restless leg syndrome 1999    Past Surgical History:  Procedure Laterality Date   ACHILLES TENDON SURGERY Right 11/16/2021   Procedure: PTAL - ACHILLES TENDON MICROTENOTOMY;  Surgeon: Rosetta Posner, DPM;  Location: [facility];  Service: Podiatry;  Laterality: Right;   ARTHRODESIS METATARSAL Right 11/16/2021   Procedure: ARTHRODESIS METATARSAL, T-N, C-C OR MET-CUNNEIFORM, NCJ;  Surgeon: Rosetta Posner, DPM;  Location: [facility];  Service: Podiatry;  Laterality: Right;   CATARACT EXTRACTION     CATARACT EXTRACTION W/PHACO Left 08/15/2020   Procedure: CATARACT EXTRACTION PHACO AND INTRAOCULAR LENS PLACEMENT (IOC) LEFT 6.60 00:39.8;  Surgeon: Galen Manila, MD;  Location: [facility];  Service: Ophthalmology;  Laterality: Left;   CATARACT EXTRACTION W/PHACO Right 08/29/2020  Procedure: CATARACT EXTRACTION PHACO AND INTRAOCULAR LENS PLACEMENT (IOC) RIGHT 6.94 00:42.3;  Surgeon: Galen Manila, MD;  Location: [facility];  Service: Ophthalmology;  Laterality: Right;   CESAREAN SECTION  1972, 1975   X2   COLONOSCOPY  12/06/2011   normal   COLONOSCOPY WITH PROPOFOL N/A 04/13/2018   Procedure: COLONOSCOPY WITH PROPOFOL;  Surgeon: Scot Jun, MD;  Location: [facility];  Service: Endoscopy;  Laterality: N/A;   ESOPHAGOGASTRODUODENOSCOPY (EGD) WITH PROPOFOL N/A 02/23/2015   Procedure: ESOPHAGOGASTRODUODENOSCOPY (EGD) WITH PROPOFOL;  Surgeon: Scot Jun, MD;  Location: [facility];  Service: Endoscopy;   Laterality: N/A;   ESOPHAGOGASTRODUODENOSCOPY (EGD) WITH PROPOFOL N/A 04/13/2018   Procedure: ESOPHAGOGASTRODUODENOSCOPY (EGD) WITH PROPOFOL;  Surgeon: Scot Jun, MD;  Location: [facility];  Service: Endoscopy;  Laterality: N/A;   FOOT ARTHRODESIS Right 11/16/2021   Procedure: ARTHRODESIS FOOT; TRIPLE;  Surgeon: Rosetta Posner, DPM;  Location: [facility];  Service: Podiatry;  Laterality: Right;   FOOT SURGERY     with screws   HARDWARE REMOVAL Right 11/16/2021   Procedure: HARDWARE REMOVAL;  Surgeon: Rosetta Posner, DPM;  Location: [facility];  Service: Podiatry;  Laterality: Right;   HIP CLOSED REDUCTION Left 08/08/2015   Procedure: CLOSED REDUCTION HIP;  Surgeon: Donato Heinz, MD;  Location: [facility];  Service: Orthopedics;  Laterality: Left;   HIP CLOSED REDUCTION Left 09/18/2015   Procedure: CLOSED REDUCTION HIP;  Surgeon: Kennedy Bucker, MD;  Location: [facility];  Service: Orthopedics;  Laterality: Left;   HIP CLOSED REDUCTION Left 12/01/2015   Procedure: CLOSED REDUCTION HIP;  Surgeon: Donato Heinz, MD;  Location: [facility];  Service: Orthopedics;  Laterality: Left;  no prep no incision   JOINT REPLACEMENT  10/2016   left knee   KNEE ARTHROPLASTY Left 10/28/2016   Procedure: COMPUTER ASSISTED TOTAL KNEE ARTHROPLASTY;  Surgeon: Donato Heinz, MD;  Location: [facility];  Service: Orthopedics;  Laterality: Left;   KNEE ARTHROPLASTY Right 07/07/2017   Procedure: COMPUTER ASSISTED TOTAL KNEE ARTHROPLASTY;  Surgeon: Donato Heinz, MD;  Location: [facility];  Service: Orthopedics;  Laterality: Right;   KNEE ARTHROSCOPY Right 11/14/2014   Procedure: ARTHROSCOPY KNEE;  Surgeon: Donato Heinz, MD;  Location: [facility];  Service: Orthopedics;  Laterality: Right;  Posterior horn menicus, anterior lateral menicus grade 3 chondromalacia   phlebitis groin     right foot surgery     11/2021   SHOULDER ARTHROSCOPY WITH OPEN ROTATOR CUFF REPAIR Left 03/10/2019   Procedure: LEFT SHOULDER  ARTHROSCOPY, ARTHROSCOPIC ROTATOR CUFF REPAIR, DISTAL CLAVICLE EXCISION, SUBACROMIAL DECOMPRESSION, INTRA-ARTICULAR DEBRIDEMENT ;  Surgeon: Lyndle Herrlich, MD;  Location: [facility];  Service: Orthopedics;  Laterality: Left;   SHOULDER ARTHROSCOPY WITH ROTATOR CUFF REPAIR Left 06/21/2019   Procedure: SHOULDER ARTHROSCOPY WITH EXTENSIVE INTRAARTICULAR DEBRIDEMENT WITH REMOVAL OF HARDWARE;  Surgeon: Lyndle Herrlich, MD;  Location: [facility];  Service: Orthopedics;  Laterality: Left;   TONSILLECTOMY     TOTAL HIP ARTHROPLASTY Left 05/08/2011   necrosis of hip   TOTAL HIP ARTHROPLASTY Right 06/14/2015   Procedure: TOTAL HIP ARTHROPLASTY;  Surgeon: Donato Heinz, MD;  Location: [facility];  Service: Orthopedics;  Laterality: Right;   TOTAL HIP REVISION Left 02/21/2016   Procedure: TOTAL HIP REVISION;  Surgeon: Donato Heinz, MD;  Location: [facility];  Service: Orthopedics;  Laterality: Left;     Social History   Tobacco Use   Smoking status: Never   Smokeless tobacco: Never  Vaping Use  Vaping status: Never Used  Substance Use Topics   Alcohol use: No   Drug use: No      Family History  Problem Relation Age of Onset   Diabetes Mother    Osteoarthritis Mother    Diabetes Maternal Grandfather    Hypertension Maternal Grandfather    Ovarian cancer Paternal Aunt    Cancer - Lung Paternal Uncle      Allergies  Allergen Reactions   Penicillins Hives and Other (See Comments)    Has patient had a PCN reaction causing immediate rash, facial/tongue/throat swelling, SOB or lightheadedness with hypotension: No Has patient had a PCN reaction causing severe rash involving mucus membranes or skin necrosis: No Has patient had a PCN reaction that required hospitalization No Has patient had a PCN reaction occurring within the last 10 years: No If all of the above answers are "NO", then may proceed with Cephalosporin use. Other reaction(s): Unknown   Erythromycin Rash   Other Nausea  Only    Mycins   Tetracyclines & Related Rash     REVIEW OF SYSTEMS (Negative unless checked)   Constitutional: [] Weight loss  [] Fever  [] Chills Cardiac: [] Chest pain   [] Chest pressure   [] Palpitations   [] Shortness of breath when laying flat   [] Shortness of breath at rest   [] Shortness of breath with exertion. Vascular:  [x] Pain in legs with walking   [] Pain in legs at rest   [] Pain in legs when laying flat   [] Claudication   [] Pain in feet when walking  [] Pain in feet at rest  [] Pain in feet when laying flat   [] History of DVT   [] Phlebitis   [x] Swelling in legs   [] Varicose veins   [] Non-healing ulcers Pulmonary:   [] Uses home oxygen   [] Productive cough   [] Hemoptysis   [] Wheeze  [] COPD   [] Asthma Neurologic:  [] Dizziness  [] Blackouts   [] Seizures   [] History of stroke   [] History of TIA  [] Aphasia   [] Temporary blindness   [] Dysphagia   [] Weakness or numbness in arms   [] Weakness or numbness in legs Musculoskeletal:  [x] Arthritis   [] Joint swelling   [x] Joint pain   [] Low back pain Hematologic:  [] Easy bruising  [] Easy bleeding   [] Hypercoagulable state   [] Anemic  [] Hepatitis Gastrointestinal:  [] Blood in stool   [] Vomiting blood  [x] Gastroesophageal reflux/heartburn   [] Abdominal pain Genitourinary:  [] Chronic kidney disease   [] Difficult urination  [] Frequent urination  [] Burning with urination   [] Hematuria Skin:  [] Rashes   [] Ulcers   [] Wounds Psychological:  [] History of anxiety   []  History of major depression.  Physical Examination  BP 127/70 (BP Location: Right Arm)   Pulse 63   Resp 16   Wt 195 lb 3.2 oz (88.5 kg)   LMP 02/08/2005 (Approximate)   BMI 34.58 kg/m  Gen:  WD/WN, NAD Head: [place]/AT, No temporalis wasting. Ear/Nose/Throat: Hearing grossly intact, nares w/o erythema or drainage Eyes: Conjunctiva clear. Sclera non-icteric Neck: Supple.  Trachea midline Pulmonary:  Good air movement, no use of accessory muscles.  Cardiac: RRR, no JVD Vascular:  Vessel Right  Left  Radial Palpable Palpable       Musculoskeletal: M/S 5/5 throughout.  No deformity or atrophy. 2+ BLE edema.  Hyperpigmentation is present in both lower extremities. Neurologic: Sensation grossly intact in extremities.  Symmetrical.  Speech is fluent.  Psychiatric: Judgment intact, Mood & affect appropriate for pt's clinical situation. Dermatologic: No rashes or ulcers noted.  No cellulitis or open wounds.  Labs No results found for this or any previous visit (from the past 2160 hour(s)).  Radiology VAS [place] LOWER EXTREMITY VENOUS REFLUX  Result Date: 01/01/2023  Lower Venous Reflux Study Patient Name:  LECIA AMANTE  Date of Exam:   12/31/2022 Medical Rec #: [id number]       Accession #:    [id number] Date of Birth: 1945-06-18        Patient Gender: F Patient Age:   19 years Exam Location:  [facility] Procedure:      VAS [place] LOWER EXTREMITY VENOUS REFLUX Referring Phys: Festus Barren --------------------------------------------------------------------------------  Indications: Swelling, and Edema.  Performing Technologist: Salvadore Farber RVT  Examination Guidelines: A complete evaluation includes B-mode imaging, spectral Doppler, color Doppler, and power Doppler as needed of all accessible portions of each vessel. Bilateral testing is considered an integral part of a complete examination. Limited examinations for reoccurring indications may be performed as noted. The reflux portion of the exam is performed with the patient in reverse Trendelenburg. Significant venous reflux is defined as >500 ms in the superficial venous system, and >1 second in the deep venous system.   Summary: Bilateral: - No evidence of deep vein thrombosis seen in the lower extremities, bilaterally, from the common femoral through the popliteal veins. - No evidence of superficial venous thrombosis in the lower extremities, bilaterally. - No evidence of deep venous insufficiency seen bilaterally in the lower  extremity. - No evidence of superficial venous reflux seen in the greater saphenous veins bilaterally. - No evidence of superficial venous reflux seen in the short saphenous veins bilaterally.  *See table(s) above for measurements and observations. Electronically signed by Festus Barren MD on 01/01/2023 at 1:41:03 PM.    Final     Assessment/Plan Benign essential HTN blood pressure control important in reducing the progression of atherosclerotic disease. On appropriate oral medications.     Varicose veins of both legs with edema Venous reflux study was normal so I think most of the edema is coming from lymphedema at this point.  Lymphedema Despite appropriate conservative therapies including 20-30 mmHg compression stockings, exercise, and increased leg elevation the patient continues to have symptoms of stage 2 lymphedema.  The patient has been doing these therapies for years, and she saw [place] last almost 2 months ago.  The symptoms are significant and bothersome on a daily basis. At this point, I think a lymphedema pump would be an excellent adjuvant therapy to try to improve their symptoms. I have discussed the reason and rationale for the lymphedema pump. This would be in addition to the conservative therapies and not to replace them.  The patient has stasis dermatitis with hyperpigmentation from her swelling and clearly needs a lymphedema pump in addition to her conservative therapies to avoid progression and possible ulceration and cellulitis.  We will plan to see her back in a few months in follow-up.    Festus Barren, MD  01/28/2023 12:29 PM    This note was created with Dragon medical transcription system.  Any errors from dictation are purely unintentional

## 2023-01-31 DIAGNOSIS — Z0000 Encounter for general adult medical examination without abnormal findings: Secondary | ICD-10-CM | POA: Diagnosis not present

## 2023-01-31 DIAGNOSIS — F320 Major depressive disorder, single episode, mild: Secondary | ICD-10-CM | POA: Diagnosis not present

## 2023-01-31 DIAGNOSIS — Z79899 Other long term (current) drug therapy: Secondary | ICD-10-CM | POA: Diagnosis not present

## 2023-01-31 DIAGNOSIS — Z1331 Encounter for screening for depression: Secondary | ICD-10-CM | POA: Diagnosis not present

## 2023-01-31 DIAGNOSIS — Z1211 Encounter for screening for malignant neoplasm of colon: Secondary | ICD-10-CM | POA: Diagnosis not present

## 2023-01-31 DIAGNOSIS — I10 Essential (primary) hypertension: Secondary | ICD-10-CM | POA: Diagnosis not present

## 2023-02-03 DIAGNOSIS — M2042 Other hammer toe(s) (acquired), left foot: Secondary | ICD-10-CM | POA: Diagnosis not present

## 2023-02-03 DIAGNOSIS — M79671 Pain in right foot: Secondary | ICD-10-CM | POA: Diagnosis not present

## 2023-02-03 DIAGNOSIS — I872 Venous insufficiency (chronic) (peripheral): Secondary | ICD-10-CM | POA: Diagnosis not present

## 2023-02-03 DIAGNOSIS — M19071 Primary osteoarthritis, right ankle and foot: Secondary | ICD-10-CM | POA: Diagnosis not present

## 2023-02-03 DIAGNOSIS — M21071 Valgus deformity, not elsewhere classified, right ankle: Secondary | ICD-10-CM | POA: Diagnosis not present

## 2023-02-03 DIAGNOSIS — I890 Lymphedema, not elsewhere classified: Secondary | ICD-10-CM | POA: Diagnosis not present

## 2023-02-03 DIAGNOSIS — M810 Age-related osteoporosis without current pathological fracture: Secondary | ICD-10-CM | POA: Diagnosis not present

## 2023-02-03 DIAGNOSIS — M2141 Flat foot [pes planus] (acquired), right foot: Secondary | ICD-10-CM | POA: Diagnosis not present

## 2023-02-03 DIAGNOSIS — G8929 Other chronic pain: Secondary | ICD-10-CM | POA: Diagnosis not present

## 2023-02-03 DIAGNOSIS — M2142 Flat foot [pes planus] (acquired), left foot: Secondary | ICD-10-CM | POA: Diagnosis not present

## 2023-04-01 DIAGNOSIS — L601 Onycholysis: Secondary | ICD-10-CM | POA: Diagnosis not present

## 2023-04-01 DIAGNOSIS — M19071 Primary osteoarthritis, right ankle and foot: Secondary | ICD-10-CM | POA: Diagnosis not present

## 2023-04-01 DIAGNOSIS — B351 Tinea unguium: Secondary | ICD-10-CM | POA: Diagnosis not present

## 2023-04-01 DIAGNOSIS — M2141 Flat foot [pes planus] (acquired), right foot: Secondary | ICD-10-CM | POA: Diagnosis not present

## 2023-04-01 DIAGNOSIS — M2142 Flat foot [pes planus] (acquired), left foot: Secondary | ICD-10-CM | POA: Diagnosis not present

## 2023-04-01 DIAGNOSIS — I890 Lymphedema, not elsewhere classified: Secondary | ICD-10-CM | POA: Diagnosis not present

## 2023-04-01 DIAGNOSIS — M810 Age-related osteoporosis without current pathological fracture: Secondary | ICD-10-CM | POA: Diagnosis not present

## 2023-04-01 DIAGNOSIS — M79675 Pain in left toe(s): Secondary | ICD-10-CM | POA: Diagnosis not present

## 2023-04-01 DIAGNOSIS — L603 Nail dystrophy: Secondary | ICD-10-CM | POA: Diagnosis not present

## 2023-04-07 ENCOUNTER — Telehealth (INDEPENDENT_AMBULATORY_CARE_PROVIDER_SITE_OTHER): Payer: Self-pay

## 2023-04-07 NOTE — Telephone Encounter (Signed)
Patient left a voicemail to check on her lymph pump forms. Forms were signed and faxed 04/04/23

## 2023-04-07 NOTE — Telephone Encounter (Signed)
Spoke with patient and she was not happy with the time it takes to get the lymph pump. Advised her there are several moving parts but once we saw her at the visit the order has to then go to [employer] who then comes to see her for measurements and then it has to be submitted to Insurance. Apologized for the delay but the process takes time and some of the parts are out of our control. I did advise I would speak with the [employer] rep to see if we can do something different to improve the process.

## 2023-04-15 ENCOUNTER — Ambulatory Visit: Payer: Medicare HMO | Admitting: Dermatology

## 2023-04-15 DIAGNOSIS — S50861A Insect bite (nonvenomous) of right forearm, initial encounter: Secondary | ICD-10-CM | POA: Diagnosis not present

## 2023-04-15 DIAGNOSIS — W57XXXA Bitten or stung by nonvenomous insect and other nonvenomous arthropods, initial encounter: Secondary | ICD-10-CM

## 2023-04-15 MED ORDER — HYDROXYZINE HCL 10 MG PO TABS
ORAL_TABLET | ORAL | 0 refills | Status: AC
Start: 2023-04-15 — End: ?

## 2023-04-15 MED ORDER — BETAMETHASONE DIPROPIONATE 0.05 % EX CREA
TOPICAL_CREAM | CUTANEOUS | 0 refills | Status: AC
Start: 2023-04-15 — End: ?

## 2023-04-15 NOTE — Patient Instructions (Signed)

## 2023-04-15 NOTE — Progress Notes (Signed)
   Follow-Up Visit   Subjective  Claudia Terry is a 77 y.o. female who presents for the following: Breaking out to the arms, bilateral hips, lower legs, itchy, started last Thursday. Patient has been helping a friend clean out her house, but friend doesn't have any bites. Patient's husband doesn't have bites. No other changes, no new meds, no new products. Patient is still getting new bumps and she hasn't been to her friends house since last week. Patient hasn't been working in yard or walking in tall grass.   The following portions of the chart were reviewed this encounter and updated as appropriate: medications, allergies, medical history  Review of Systems:  No other skin or systemic complaints except as noted in HPI or Assessment and Plan.  Objective  Well appearing patient in no apparent distress; mood and affect are within normal limits.  A focused examination was performed of the following areas: Face, arms, legs  Relevant physical exam findings are noted in the Assessment and Plan.  Right Forearm - Anterior Edematous pink papules forearms, hand, legs.    Assessment & Plan   Bug bite without infection, initial encounter Right Forearm - Anterior  Start Betamethasone dipropionate cream Apply to affected areas itchy bumps twice daily until improved. Avoid applying to face, groin, and axilla. Use as directed. Long-term use can cause thinning of the skin.  Start hydroxyzine 10 mg take 1-2 po at bedtime prn itch dsp #60 0Rf. May make drowsy.  RTC if not improving   hydrOXYzine (ATARAX) 10 MG tablet - Right Forearm - Anterior Take 1-2 tablets by mouth at night as needed for itching. May make drowsy.  betamethasone dipropionate 0.05 % cream - Right Forearm - Anterior Apply to affected areas itchy bumps twice daily until improved. Avoid applying to face, groin, and axilla. Use as directed. Long-term use can cause thinning of the skin.     Return if symptoms worsen or fail  to improve.  ICherlyn Labella, CMA, am acting as scribe for Willeen Niece, MD .   Documentation: I have reviewed the above documentation for accuracy and completeness, and I agree with the above.  Willeen Niece, MD

## 2023-04-22 ENCOUNTER — Encounter: Payer: Self-pay | Admitting: Dermatology

## 2023-04-22 ENCOUNTER — Ambulatory Visit (INDEPENDENT_AMBULATORY_CARE_PROVIDER_SITE_OTHER): Payer: Medicare HMO | Admitting: Dermatology

## 2023-04-22 DIAGNOSIS — S1086XD Insect bite of other specified part of neck, subsequent encounter: Secondary | ICD-10-CM | POA: Diagnosis not present

## 2023-04-22 DIAGNOSIS — S50862D Insect bite (nonvenomous) of left forearm, subsequent encounter: Secondary | ICD-10-CM | POA: Diagnosis not present

## 2023-04-22 DIAGNOSIS — W57XXXD Bitten or stung by nonvenomous insect and other nonvenomous arthropods, subsequent encounter: Secondary | ICD-10-CM

## 2023-04-22 NOTE — Progress Notes (Signed)
   Follow-Up Visit   Subjective  Claudia Terry is a 77 y.o. female who presents for the following: Spots on neck, left forearm. Was seen here 04/15/2023. Had spots on arms and buttocks. Betamethasone and hydroxyzine cleared those areas. Dx as insect bites. Was told to come back for biopsy of any new spots.  Patient has still been helping a friend clean out her house, but friend doesn't have any bites. Patient's husband still doesn't have bites. Has not seen any bugs in friend's house or her house.  The patient has spots, moles and lesions to be evaluated, some may be new or changing and the patient may have concern these could be cancer.    The following portions of the chart were reviewed this encounter and updated as appropriate: medications, allergies, medical history  Review of Systems:  No other skin or systemic complaints except as noted in HPI or Assessment and Plan.  Objective  Well appearing patient in no apparent distress; mood and affect are within normal limits.  A focused examination was performed of the following areas: Neck, left forearm Relevant physical exam findings are noted in the Assessment and Plan.  Left Forearm - Anterior, left lateral neck 3 pink edematous papules at left neck and left dorsal forearm.     Assessment & Plan     Bug bite without infection, subsequent encounter Left Forearm - Anterior, left lateral neck  With new bites  Continue Betamethasone dipropionate cream Apply to affected areas itchy bumps twice daily until improved. Avoid applying to face, groin, and axilla.   Topical steroids (such as triamcinolone, fluocinolone, fluocinonide, mometasone, clobetasol, halobetasol, betamethasone, hydrocortisone) can cause thinning and lightening of the skin if they are used for too long in the same area. Your physician has selected the right strength medicine for your problem and area affected on the body. Please use your medication only as directed  by your physician to prevent side effects.    Continue hydroxyzine 10 mg take 1-2 po at bedtime prn itch dsp #60 0Rf. May make drowsy.   Recommend professional exterminator to come check house for bedbugs.   RTC if no bedbugs and continued itchy bumps, may biopsy      Return if symptoms worsen or fail to improve.  I, Lawson Radar, CMA, am acting as scribe for Willeen Niece, MD.   Documentation: I have reviewed the above documentation for accuracy and completeness, and I agree with the above.  Willeen Niece, MD

## 2023-04-22 NOTE — Patient Instructions (Addendum)
Continue Betamethasone dipropionate cream Apply to affected areas itchy bumps twice daily until improved. Avoid applying to face, groin, and axilla.  Call if need refills.  Topical steroids (such as triamcinolone, fluocinolone, fluocinonide, mometasone, clobetasol, halobetasol, betamethasone, hydrocortisone) can cause thinning and lightening of the skin if they are used for too long in the same area. Your physician has selected the right strength medicine for your problem and area affected on the body. Please use your medication only as directed by your physician to prevent side effects.    Continue hydroxyzine 10 mg take 1-2 po at bedtime prn itch dsp #60 0Rf. May make drowsy.   Recommend professional exterminator to come check house.     Due to recent changes in healthcare laws, you may see results of your pathology and/or laboratory studies on MyChart before the doctors have had a chance to review them. We understand that in some cases there may be results that are confusing or concerning to you. Please understand that not all results are received at the same time and often the doctors may need to interpret multiple results in order to provide you with the best plan of care or course of treatment. Therefore, we ask that you please give [place] 2 business days to thoroughly review all your results before contacting the office for clarification. Should we see a critical lab result, you will be contacted sooner.   If You Need Anything After Your Visit  If you have any questions or concerns for your doctor, please call our main line at [phone] and press option 4 to reach your doctor's medical assistant. If no one answers, please leave a voicemail as directed and we will return your call as soon as possible. Messages left after 4 pm will be answered the following business day.   You may also send [place] a message via MyChart. We typically respond to MyChart messages within 1-2 business days.  For  prescription refills, please ask your pharmacy to contact our office. Our fax number is [phone].  If you have an urgent issue when the clinic is closed that cannot wait until the next business day, you can page your doctor at the number below.    Please note that while we do our best to be available for urgent issues outside of office hours, we are not available 24/7.   If you have an urgent issue and are unable to reach [place], you may choose to seek medical care at your doctor's office, retail clinic, urgent care center, or emergency room.  If you have a medical emergency, please immediately call 911 or go to the emergency department.  Pager Numbers  - Dr. Gwen Pounds: [phone]  - Dr. Roseanne Reno: [phone]  - Dr. Katrinka Blazing: [phone]   In the event of inclement weather, please call our main line at [phone] for an update on the status of any delays or closures.  Dermatology Medication Tips: Please keep the boxes that topical medications come in in order to help keep track of the instructions about where and how to use these. Pharmacies typically print the medication instructions only on the boxes and not directly on the medication tubes.   If your medication is too expensive, please contact our office at [phone] option 4 or send [place] a message through MyChart.   We are unable to tell what your co-pay for medications will be in advance as this is different depending on your insurance coverage. However, we may be able to find a substitute  medication at lower cost or fill out paperwork to get insurance to cover a needed medication.   If a prior authorization is required to get your medication covered by your insurance company, please allow [place] 1-2 business days to complete this process.  Drug prices often vary depending on where the prescription is filled and some pharmacies may offer cheaper prices.  The website www.goodrx.com contains coupons for medications through different  pharmacies. The prices here do not account for what the cost may be with help from insurance (it may be cheaper with your insurance), but the website can give you the price if you did not use any insurance.  - You can print the associated coupon and take it with your prescription to the pharmacy.  - You may also stop by our office during regular business hours and pick up a GoodRx coupon card.  - If you need your prescription sent electronically to a different pharmacy, notify our office through [facility] or by phone at [phone] option 4.     Si Usted Necesita Algo Despus de Su Visita  Tambin puede enviarnos un mensaje a travs de Clinical cytogeneticist. Por lo general respondemos a los mensajes de MyChart en el transcurso de 1 a 2 das hbiles.  Para renovar recetas, por favor pida a su farmacia que se ponga en contacto con nuestra oficina. Annie Sable de fax es [place] [phone].  Si tiene un asunto urgente cuando la clnica est cerrada y que no puede esperar hasta el siguiente da hbil, puede llamar/localizar a su doctor(a) al nmero que aparece a continuacin.   Por favor, tenga en cuenta que aunque hacemos todo lo posible para estar disponibles para asuntos urgentes fuera del horario de [place], no estamos disponibles las 24 horas del da, los 7 [address]  [address] de la [place].   Si tiene un problema urgente y no puede comunicarse con nosotros, puede optar por buscar atencin mdica  en el consultorio de su doctor(a), en una clnica privada, en un centro de atencin urgente o en una sala de emergencias.  Si tiene Engineer, drilling, por favor llame inmediatamente al 911 o vaya a la sala de emergencias.  Nmeros de bper  - Dr. Gwen Pounds: [phone]  - Dra. Roseanne Reno: [phone]  - Dr. Katrinka Blazing: [phone]   En caso de inclemencias del tiempo, por favor llame a Lacy Duverney principal al [phone] para una actualizacin sobre el [place] de cualquier retraso o cierre.  Consejos para la  medicacin en dermatologa: Por favor, guarde las cajas en las que vienen los medicamentos de uso tpico para ayudarle a seguir las instrucciones sobre dnde y cmo usarlos. Las farmacias generalmente imprimen las instrucciones del medicamento slo en las cajas y no directamente en los tubos del [place].   Si su medicamento es muy caro, por favor, pngase en contacto con Rolm Gala llamando al [phone] y presione la opcin 4 o envenos un mensaje a travs de Clinical cytogeneticist.   No podemos decirle cul ser su copago por los medicamentos por adelantado ya que esto es diferente dependiendo de la cobertura de su seguro. Sin embargo, es posible que podamos encontrar un medicamento sustituto a Audiological scientist un formulario para que el seguro cubra el medicamento que se considera necesario.   Si se requiere una autorizacin previa para que su compaa de seguros [place] su medicamento, por favor permtanos de 1 a 2 das hbiles para completar [address]  Los precios de los medicamentos varan con frecuencia dependiendo del Environmental consultant  de dnde se surte la receta y alguna farmacias pueden ofrecer precios ms baratos.  El sitio web www.goodrx.com tiene cupones para medicamentos de Health and safety inspector. Los precios aqu no tienen en cuenta lo que podra costar con la ayuda del seguro (puede ser ms barato con su seguro), pero el sitio web puede darle el precio si no utiliz Tourist information centre manager.  - Puede imprimir el cupn correspondiente y llevarlo con su receta a la farmacia.  - Tambin puede pasar por nuestra oficina durante el horario de atencin regular y Education officer, museum una tarjeta de cupones de GoodRx.  - Si necesita que su receta se enve electrnicamente a una farmacia diferente, informe a nuestra oficina a travs de MyChart de [place] o por telfono llamando al [phone] y presione la opcin 4.

## 2023-05-02 ENCOUNTER — Ambulatory Visit (INDEPENDENT_AMBULATORY_CARE_PROVIDER_SITE_OTHER): Payer: Medicare HMO | Admitting: Vascular Surgery

## 2023-05-16 DIAGNOSIS — R1310 Dysphagia, unspecified: Secondary | ICD-10-CM | POA: Diagnosis not present

## 2023-05-16 DIAGNOSIS — K219 Gastro-esophageal reflux disease without esophagitis: Secondary | ICD-10-CM | POA: Diagnosis not present

## 2023-05-16 DIAGNOSIS — Z83719 Family history of colon polyps, unspecified: Secondary | ICD-10-CM | POA: Diagnosis not present

## 2023-05-19 DIAGNOSIS — H02831 Dermatochalasis of right upper eyelid: Secondary | ICD-10-CM | POA: Diagnosis not present

## 2023-05-19 DIAGNOSIS — Z961 Presence of intraocular lens: Secondary | ICD-10-CM | POA: Diagnosis not present

## 2023-05-19 DIAGNOSIS — H40003 Preglaucoma, unspecified, bilateral: Secondary | ICD-10-CM | POA: Diagnosis not present

## 2023-05-26 DIAGNOSIS — Z1231 Encounter for screening mammogram for malignant neoplasm of breast: Secondary | ICD-10-CM | POA: Diagnosis not present

## 2023-06-11 DIAGNOSIS — M810 Age-related osteoporosis without current pathological fracture: Secondary | ICD-10-CM

## 2023-06-11 HISTORY — DX: Age-related osteoporosis without current pathological fracture: M81.0

## 2023-06-17 ENCOUNTER — Encounter (INDEPENDENT_AMBULATORY_CARE_PROVIDER_SITE_OTHER): Payer: Self-pay | Admitting: Vascular Surgery

## 2023-06-17 ENCOUNTER — Ambulatory Visit (INDEPENDENT_AMBULATORY_CARE_PROVIDER_SITE_OTHER): Payer: Medicare (Managed Care) | Admitting: Vascular Surgery

## 2023-06-17 VITALS — BP 115/72 | HR 69 | Resp 16 | Ht 61.5 in | Wt 198.2 lb

## 2023-06-17 DIAGNOSIS — I890 Lymphedema, not elsewhere classified: Secondary | ICD-10-CM | POA: Diagnosis not present

## 2023-06-17 DIAGNOSIS — I83893 Varicose veins of bilateral lower extremities with other complications: Secondary | ICD-10-CM | POA: Diagnosis not present

## 2023-06-17 DIAGNOSIS — I10 Essential (primary) hypertension: Secondary | ICD-10-CM | POA: Diagnosis not present

## 2023-06-17 NOTE — Progress Notes (Signed)
 MRN : [id number]  Claudia Terry is a 78 y.o. (1945/11/25) female who presents with chief complaint of  Chief Complaint  Patient presents with   Follow-up    3-4 month follow up  .  History of Present Illness: Patient returns today in follow up of her lymphedema.  She has been using her lymphedema pump now for a month or 2 and does notice some improvement in her legs over the past few weeks.  The first few weeks she used the pump, she really did not see much improvement.  Her mobility is still not up to normal after her extensive foot surgery and 3 months of complete immobility after this.  Current Outpatient Medications  Medication Sig Dispense Refill   acetaminophen  (TYLENOL ) 500 MG tablet Take 1,000 mg by mouth every 6 (six) hours as needed.     atenolol  (TENORMIN ) 50 MG tablet daily.     betamethasone  dipropionate 0.05 % cream Apply to affected areas itchy bumps twice daily until improved. Avoid applying to face, groin, and axilla. Use as directed. Long-term use can cause thinning of the skin. 45 g 0   Biotin 5000 MCG TABS Take 10,000 mcg by mouth daily.     Calcium  Carbonate-Vitamin D  (CALTRATE 600+D PO) Take by mouth 2 (two) times daily.     clindamycin  (CLEOCIN ) 300 MG capsule Take 300 mg by mouth daily. For dental     Coenzyme Q10 (CO Q-10) 200 MG CAPS Take 100 mg by mouth daily.     Glucosamine HCl-MSM 1500-500 MG/30ML LIQD Take by mouth 2 (two) times daily.     hydrOXYzine  (ATARAX ) 10 MG tablet Take 1-2 tablets by mouth at night as needed for itching. May make drowsy. 60 tablet 0   Misc. Devices (COMMODE BEDSIDE) MISC 1 Device by Does not apply route as needed. 3:1 BSC 1 each 0   omeprazole (PRILOSEC) 20 MG capsule Take by mouth.     pramipexole  (MIRAPEX ) 1.5 MG tablet Take 1.5 mg by mouth at bedtime.      Red Yeast Rice 600 MG TABS Take 600 mg by mouth 2 (two) times daily.     traZODone  (DESYREL ) 50 MG tablet trazodone  50 mg tablet     No current facility-administered  medications for this visit.    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 Geryl   History of 2019 novel coronavirus disease (COVID-19) 01/15/2021   Hypertension    Low back pain 04/1994   Menopause    Osteoarthritis    Restless leg syndrome 1999    Past Surgical History:  Procedure Laterality Date   ACHILLES TENDON SURGERY Right 11/16/2021   Procedure: PTAL - ACHILLES TENDON MICROTENOTOMY;  Surgeon: Lennie Barter, DPM;  Location: [facility];  Service: Podiatry;  Laterality: Right;   ARTHRODESIS METATARSAL Right 11/16/2021   Procedure: ARTHRODESIS METATARSAL, T-N, C-C OR MET-CUNNEIFORM, NCJ;  Surgeon: Lennie Barter, DPM;  Location: [facility];  Service: Podiatry;  Laterality: Right;   CATARACT EXTRACTION     CATARACT EXTRACTION W/PHACO Left 08/15/2020   Procedure: CATARACT EXTRACTION PHACO AND INTRAOCULAR LENS PLACEMENT (IOC) LEFT 6.60 00:39.8;  Surgeon: Jaye Fallow, MD;  [facility];  Service: Ophthalmology;  Laterality: Left;   CATARACT EXTRACTION W/PHACO Right 08/29/2020   Procedure: CATARACT EXTRACTION PHACO AND INTRAOCULAR LENS PLACEMENT (IOC)  RIGHT 6.94 00:42.3;  Surgeon: Jaye Fallow, MD;  Location: [facility];  Service: Ophthalmology;  Laterality: Right;   CESAREAN SECTION  1972, 1975   X2   COLONOSCOPY  12/06/2011   normal   COLONOSCOPY WITH PROPOFOL  N/A 04/13/2018   Procedure: COLONOSCOPY WITH PROPOFOL ;  Surgeon: Viktoria Lamar DASEN, MD;  Location[facility] END[facility];  Service: Endoscopy;  Laterality: N/A;   ESOPHAGOGASTRODUODENOSCOPY (EGD) WITH PROPOFOL  N/A 02/23/2015   Procedure: ESOPHAGOGASTRODUODENOSCOPY (EGD) WITH PROPOFOL ;  Surgeon: Lamar DASEN Viktoria, MD;  Location: [facility] ENDOSCOPY;  Service: Endoscopy;  Laterality: N/A;   ESOPHAGOGASTRODUODENOSCOPY (EGD) WITH PROPOFOL  N/A 04/13/2018   Procedure:  ESOPHAGOGASTRODUODENOSCOPY (EGD) WITH PROPOFOL ;  Surgeon: Viktoria Lamar DASEN, MD;  Location: [facility] ENDOSCOPY;  Service: Endoscopy;  Laterality: N/A;   FOOT ARTHRODESIS Right 11/16/2021   Procedure: ARTHRODESIS FOOT; TRIPLE;  Surgeon: Lennie Barter, DPM;  Location: [facility];  Service: Podiatry;  Laterality: Right;   FOOT SURGERY     with screws   HARDWARE REMOVAL Right 11/16/2021   Procedure: HARDWARE REMOVAL;  Surgeon: Lennie Barter, DPM;  Location: [facility];  Service: Podiatry;  Laterality: Right;   HIP CLOSED REDUCTION Left 08/08/2015   Procedure: CLOSED REDUCTION HIP;  Surgeon: Lynwood SHAUNNA Hue, MD;  Location: [facility];  Service: Orthopedics;  Laterality: Left;   HIP CLOSED REDUCTION Left 09/18/2015   Procedure: CLOSED REDUCTION HIP;  Surgeon: Ozell Flake, MD;  Location: [facility];  Service: Orthopedics;  Laterality: Left;   HIP CLOSED REDUCTION Left 12/01/2015   Procedure: CLOSED REDUCTION HIP;  Surgeon: Lynwood SHAUNNA Hue, MD;  Location: [facility];  Service: Orthopedics;  Laterality: Left;  no prep no incision   JOINT REPLACEMENT  10/2016   left knee   KNEE ARTHROPLASTY Left 10/28/2016   Procedure: COMPUTER ASSISTED TOTAL KNEE ARTHROPLASTY;  Surgeon: Hue Lynwood SHAUNNA, MD;  Location: [facility];  Service: Orthopedics;  Laterality: Left;   KNEE ARTHROPLASTY Right 07/07/2017   Procedure: COMPUTER ASSISTED TOTAL KNEE ARTHROPLASTY;  Surgeon: Hue Lynwood SHAUNNA, MD;  Location: [facility];  Service: Orthopedics;  Laterality: Right;   KNEE ARTHROSCOPY Right 11/14/2014   Procedure: ARTHROSCOPY KNEE;  Surgeon: Lynwood SHAUNNA Hue, MD;  Location: [facility];  Service: Orthopedics;  Laterality: Right;  Posterior horn menicus, anterior lateral menicus grade 3 chondromalacia   phlebitis groin     right foot surgery     11/2021   SHOULDER ARTHROSCOPY WITH OPEN ROTATOR CUFF REPAIR Left 03/10/2019   Procedure: LEFT SHOULDER ARTHROSCOPY, ARTHROSCOPIC ROTATOR CUFF REPAIR, DISTAL CLAVICLE EXCISION, SUBACROMIAL DECOMPRESSION,  INTRA-ARTICULAR DEBRIDEMENT ;  Surgeon: Leora Lynwood SAUNDERS, MD;  Location: [facility];  Service: Orthopedics;  Laterality: Left;   SHOULDER ARTHROSCOPY WITH ROTATOR CUFF REPAIR Left 06/21/2019   Procedure: SHOULDER ARTHROSCOPY WITH EXTENSIVE INTRAARTICULAR DEBRIDEMENT WITH REMOVAL OF HARDWARE;  Surgeon: Leora Lynwood SAUNDERS, MD;  Location: [facility];  Service: Orthopedics;  Laterality: Left;   TONSILLECTOMY     TOTAL HIP ARTHROPLASTY Left 05/08/2011   necrosis of hip   TOTAL HIP ARTHROPLASTY Right 06/14/2015   Procedure: TOTAL HIP ARTHROPLASTY;  Surgeon: Lynwood SHAUNNA Hue, MD;  Location: [facility];  Service: Orthopedics;  Laterality: Right;   TOTAL HIP REVISION Left 02/21/2016   Procedure: TOTAL HIP REVISION;  Surgeon: Lynwood SHAUNNA Hue, MD;  Location: [facility];  Service: Orthopedics;  Laterality: Left;     Social History   Tobacco Use   Smoking status: Never   Smokeless tobacco: Never  Vaping Use   Vaping status: Never Used  Substance Use Topics  Alcohol use: No   Drug use: No      Family History  Problem Relation Age of Onset   Diabetes Mother    Osteoarthritis Mother    Diabetes Maternal Grandfather    Hypertension Maternal Grandfather    Ovarian cancer Paternal Aunt    Cancer - Lung Paternal Uncle      Allergies  Allergen Reactions   Penicillins Hives and Other (See Comments)    Has patient had a PCN reaction causing immediate rash, facial/tongue/throat swelling, SOB or lightheadedness with hypotension: No Has patient had a PCN reaction causing severe rash involving mucus membranes or skin necrosis: No Has patient had a PCN reaction that required hospitalization No Has patient had a PCN reaction occurring within the last 10 years: No If all of the above answers are NO, then may proceed with Cephalosporin use. Other reaction(s): Unknown   Erythromycin Rash   Other Nausea Only    Mycins   Tetracyclines & Related Rash    REVIEW OF SYSTEMS (Negative unless checked)    Constitutional: [] Weight loss  [] Fever  [] Chills Cardiac: [] Chest pain   [] Chest pressure   [] Palpitations   [] Shortness of breath when laying flat   [] Shortness of breath at rest   [] Shortness of breath with exertion. Vascular:  [x] Pain in legs with walking   [] Pain in legs at rest   [] Pain in legs when laying flat   [] Claudication   [] Pain in feet when walking  [] Pain in feet at rest  [] Pain in feet when laying flat   [] History of DVT   [] Phlebitis   [x] Swelling in legs   [] Varicose veins   [] Non-healing ulcers Pulmonary:   [] Uses home oxygen   [] Productive cough   [] Hemoptysis   [] Wheeze  [] COPD   [] Asthma Neurologic:  [] Dizziness  [] Blackouts   [] Seizures   [] History of stroke   [] History of TIA  [] Aphasia   [] Temporary blindness   [] Dysphagia   [] Weakness or numbness in arms   [] Weakness or numbness in legs Musculoskeletal:  [x] Arthritis   [] Joint swelling   [x] Joint pain   [] Low back pain Hematologic:  [] Easy bruising  [] Easy bleeding   [] Hypercoagulable state   [] Anemic  [] Hepatitis Gastrointestinal:  [] Blood in stool   [] Vomiting blood  [x] Gastroesophageal reflux/heartburn   [] Abdominal pain Genitourinary:  [] Chronic kidney disease   [] Difficult urination  [] Frequent urination  [] Burning with urination   [] Hematuria Skin:  [] Rashes   [] Ulcers   [] Wounds Psychological:  [] History of anxiety   []  History of major depression.  Physical Examination  BP 115/72   Pulse 69   Resp 16   Ht 5' 1.5 (1.562 m)   Wt 198 lb 3.2 oz (89.9 kg)   LMP 02/08/2005 (Approximate)   BMI 36.84 kg/m  Gen:  WD/WN, NAD Head: [place]/AT, No temporalis wasting. Ear/Nose/Throat: Hearing grossly intact, nares w/o erythema or drainage Eyes: Conjunctiva clear. Sclera non-icteric Neck: Supple.  Trachea midline Pulmonary:  Good air movement, no use of accessory muscles.  Cardiac: RRR, no JVD Vascular:  Vessel Right Left  Radial Palpable Palpable           Musculoskeletal: M/S 5/5 throughout.  No deformity or  atrophy. 1-2+ BLE edema. Neurologic: Sensation grossly intact in extremities.  Symmetrical.  Speech is fluent.  Psychiatric: Judgment intact, Mood & affect appropriate for pt's clinical situation. Dermatologic: No rashes or ulcers noted.  No cellulitis or open wounds.      Labs No results found for this or  any previous visit (from the past 2160 hours).  Radiology No results found.  Assessment/Plan  Lymphedema Her lymphedema symptoms are mildly improved although still quite significant.  She is using Pap for about 2 hours a day and will continue to use this.  As her mobility increases I think this will also help her swelling significantly.  We will follow her up in about 6 months.  Benign essential HTN blood pressure control important in reducing the progression of atherosclerotic disease. On appropriate oral medications.     Varicose veins of both legs with edema Venous reflux study was normal so I think most of the edema is coming from lymphedema at this point.  Selinda Gu, MD  06/17/2023 4:42 PM    This note was created with Dragon medical transcription system.  Any errors from dictation are purely unintentional

## 2023-06-17 NOTE — Assessment & Plan Note (Signed)
 Her lymphedema symptoms are mildly improved although still quite significant.  She is using Pap for about 2 hours a day and will continue to use this.  As her mobility increases I think this will also help her swelling significantly.  We will follow her up in about 6 months.

## 2023-07-14 ENCOUNTER — Encounter: Payer: Self-pay | Admitting: Gastroenterology

## 2023-07-20 NOTE — H&P (Signed)
 Pre-Procedure H&P   Patient ID: Claudia Terry is a 78 y.o. female.  Gastroenterology Provider: Quintin Buckle, DO  Referring Provider: Rodena Citron, NP PCP: Nestor Banter, MD  Date: 07/21/2023  HPI Ms. Claudia Terry is a 78 y.o. female who presents today for Esophagogastroduodenoscopy and Colonoscopy for Dysphagia, worsening GERD, family history of colon polyps .  Dysphagia to solid foods occurring approximately 2 times per month.  No issues with liquids or odynophagia.  Patient reports normal bowel movements without melena or hematochezia.  Last underwent EGD and colonoscopy in November 2019-grade a esophagitis, biopsies negative for Barrett's esophagus negative for EOE negative for gastric intestinal metaplasia negative for H. pylori.  Colonoscopy demonstrated diverticulosis and internal hemorrhoids. 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 Dawne Euler   History of 2019 novel coronavirus disease (COVID-19) 01/15/2021   Hypertension    Low back pain 04/1994   Menopause    Osteoarthritis    Restless leg syndrome 1999    Past Surgical History:  Procedure Laterality Date   ACHILLES TENDON SURGERY Right 11/16/2021   Procedure: PTAL - ACHILLES TENDON MICROTENOTOMY;  Surgeon: Pink Bridges, DPM;  Location: [facility];  Service: Podiatry;  Laterality: Right;   ARTHRODESIS METATARSAL Right 11/16/2021   Procedure: ARTHRODESIS METATARSAL, T-N, C-C OR MET-CUNNEIFORM, NCJ;  Surgeon: Pink Bridges, DPM;   Location: [facility];  Service: Podiatry;  Laterality: Right;   CATARACT EXTRACTION     CATARACT EXTRACTION W/PHACO Left 08/15/2020   Procedure: CATARACT EXTRACTION PHACO AND INTRAOCULAR LENS PLACEMENT (IOC) LEFT 6.60 00:39.8;  Surgeon: Clair Crews, MD;  Location: [facility];  Service: Ophthalmology;  Laterality: Left;   CATARACT EXTRACTION W/PHACO Right 08/29/2020   Procedure: CATARACT EXTRACTION PHACO AND INTRAOCULAR LENS PLACEMENT (IOC) RIGHT 6.94 00:42.3;  Surgeon: Clair Crews, MD;  Location: [facility];  Service: Ophthalmology;  Laterality: Right;   CESAREAN SECTION  1972, 1975   X2   COLONOSCOPY  12/06/2011   normal   COLONOSCOPY WITH PROPOFOL  N/A 04/13/2018   Procedure: COLONOSCOPY WITH PROPOFOL ;  Surgeon: Cassie Click, MD;  Location: [facility] END[facility];  Service: Endoscopy;  Laterality: N/A;   ESOPHAGOGASTRODUODENOSCOPY (EGD) WITH PROPOFOL  N/A 02/23/2015   Procedure: ESOPHAGOGASTRODUODENOSCOPY (EGD) WITH PROPOFOL ;  Surgeon: Cassie Click, MD;  Location: [facility] ENDOSCOPY;  Service: Endoscopy;  Laterality: N/A;   ESOPHAGOGASTRODUODENOSCOPY (EGD) WITH PROPOFOL  N/A 04/13/2018   Procedure: ESOPHAGOGASTRODUODENOSCOPY (EGD) WITH PROPOFOL ;  Surgeon: Cassie Click, MD;  Location: [facility] ENDOSCOPY;  Service: Endoscopy;  Laterality: N/A;   FOOT ARTHRODESIS Right 11/16/2021   Procedure: ARTHRODESIS FOOT; TRIPLE;  Surgeon: Pink Bridges, DPM;  Location: [facility];  Service: Podiatry;  Laterality: Right;   FOOT SURGERY     with screws   HARDWARE REMOVAL Right 11/16/2021   Procedure: HARDWARE REMOVAL;  Surgeon: Pink Bridges, DPM;  Location: [facility];  Service: Podiatry;  Laterality: Right;   HIP CLOSED REDUCTION Left 08/08/2015   Procedure: CLOSED REDUCTION HIP;  Surgeon: Arlyne Lame, MD;  Location: [facility];  Service:  Orthopedics;  Laterality: Left;   HIP CLOSED REDUCTION Left 09/18/2015   Procedure: CLOSED REDUCTION HIP;  Surgeon: Molli Angelucci, MD;  Location:  [facility];  Service: Orthopedics;  Laterality: Left;   HIP CLOSED REDUCTION Left 12/01/2015   Procedure: CLOSED REDUCTION HIP;  Surgeon: Arlyne Lame, MD;  Location: [facility];  Service: Orthopedics;  Laterality: Left;  no prep no incision   JOINT REPLACEMENT  10/2016   left knee   KNEE ARTHROPLASTY Left 10/28/2016   Procedure: COMPUTER ASSISTED TOTAL KNEE ARTHROPLASTY;  Surgeon: Arlyne Lame, MD;  Location: [facility];  Service: Orthopedics;  Laterality: Left;   KNEE ARTHROPLASTY Right 07/07/2017   Procedure: COMPUTER ASSISTED TOTAL KNEE ARTHROPLASTY;  Surgeon: Arlyne Lame, MD;  Location: [facility];  Service: Orthopedics;  Laterality: Right;   KNEE ARTHROSCOPY Right 11/14/2014   Procedure: ARTHROSCOPY KNEE;  Surgeon: Arlyne Lame, MD;  Location: [facility];  Service: Orthopedics;  Laterality: Right;  Posterior horn menicus, anterior lateral menicus grade 3 chondromalacia   phlebitis groin     right foot surgery     11/2021   SHOULDER ARTHROSCOPY WITH OPEN ROTATOR CUFF REPAIR Left 03/10/2019   Procedure: LEFT SHOULDER ARTHROSCOPY, ARTHROSCOPIC ROTATOR CUFF REPAIR, DISTAL CLAVICLE EXCISION, SUBACROMIAL DECOMPRESSION, INTRA-ARTICULAR DEBRIDEMENT ;  Surgeon: Jerlyn Moons, MD;  Location: [facility];  Service: Orthopedics;  Laterality: Left;   SHOULDER ARTHROSCOPY WITH ROTATOR CUFF REPAIR Left 06/21/2019   Procedure: SHOULDER ARTHROSCOPY WITH EXTENSIVE INTRAARTICULAR DEBRIDEMENT WITH REMOVAL OF HARDWARE;  Surgeon: Jerlyn Moons, MD;  Location: [facility];  Service: Orthopedics;  Laterality: Left;   TONSILLECTOMY     TOTAL HIP ARTHROPLASTY Left 05/08/2011   necrosis of hip   TOTAL HIP ARTHROPLASTY Right 06/14/2015   Procedure: TOTAL HIP ARTHROPLASTY;  Surgeon: Arlyne Lame, MD;  Location: [facility];  Service: Orthopedics;  Laterality: Right;   TOTAL HIP REVISION Left 02/21/2016   Procedure: TOTAL HIP REVISION;  Surgeon: Arlyne Lame, MD;  Location: [facility];  Service: Orthopedics;   Laterality: Left;    Family History Mother-colon polyps No other h/o GI disease or malignancy  Review of Systems  Constitutional:  Negative for activity change, appetite change, chills, diaphoresis, fatigue, fever and unexpected weight change.  HENT:  Positive for trouble swallowing. Negative for voice change.   Respiratory:  Negative for shortness of breath and wheezing.   Cardiovascular:  Negative for chest pain, palpitations and leg swelling.  Gastrointestinal:  Negative for abdominal distention, abdominal pain, anal bleeding, blood in stool, constipation, diarrhea, nausea, rectal pain and vomiting.  Musculoskeletal:  Negative for arthralgias and myalgias.  Skin:  Negative for color change and pallor.  Neurological:  Negative for dizziness, syncope and weakness.  Psychiatric/Behavioral:  Negative for confusion.   All other systems reviewed and are negative.    Medications No current facility-administered medications on file prior to encounter.   Current Outpatient Medications on File Prior to Encounter  Medication Sig Dispense Refill   atenolol  (TENORMIN ) 50 MG tablet daily.     Biotin 5000 MCG TABS Take 10,000 mcg by mouth daily.     Calcium  Carbonate-Vitamin D  (CALTRATE 600+D PO) Take by mouth 2 (two) times daily.     Coenzyme Q10 (CO Q-10) 200 MG CAPS Take 100 mg by mouth daily.     Glucosamine HCl-MSM 1500-500 MG/30ML LIQD Take by mouth 2 (two) times daily.     omeprazole (PRILOSEC) 20 MG capsule Take by mouth.     pramipexole  (MIRAPEX )  1.5 MG tablet Take 1.5 mg by mouth at bedtime.      Red Yeast Rice 600 MG TABS Take 600 mg by mouth 2 (two) times daily.     traZODone  (DESYREL ) 50 MG tablet trazodone  50 mg tablet     acetaminophen  (TYLENOL ) 500 MG tablet Take 1,000 mg by mouth every 6 (six) hours as needed.     betamethasone  dipropionate 0.05 % cream Apply to affected areas itchy bumps twice daily until improved. Avoid applying to face, groin, and axilla. Use as directed.  Long-term use can cause thinning of the skin. 45 g 0   clindamycin  (CLEOCIN ) 300 MG capsule Take 300 mg by mouth daily. For dental     hydrOXYzine  (ATARAX ) 10 MG tablet Take 1-2 tablets by mouth at night as needed for itching. May make drowsy. 60 tablet 0   Misc. Devices (COMMODE BEDSIDE) MISC 1 Device by Does not apply route as needed. 3:1 BSC 1 each 0    Pertinent medications related to GI and procedure were reviewed by me with the patient prior to the procedure   Current Facility-Administered Medications:    0.9 %  sodium chloride  infusion, , Intravenous, Continuous, Quintin Buckle, DO, Last Rate: 20 mL/hr at 07/21/23 1010, New Bag at 07/21/23 1010  sodium chloride  20 mL/hr at 07/21/23 1010       Allergies  Allergen Reactions   Penicillins Hives and Other (See Comments)    Has patient had a PCN reaction causing immediate rash, facial/tongue/throat swelling, SOB or lightheadedness with hypotension: No Has patient had a PCN reaction causing severe rash involving mucus membranes or skin necrosis: No Has patient had a PCN reaction that required hospitalization No Has patient had a PCN reaction occurring within the last 10 years: No If all of the above answers are "NO", then may proceed with Cephalosporin use. Other reaction(s): Unknown   Erythromycin Rash   Other Nausea Only    Mycins   Tetracyclines & Related Rash   Allergies were reviewed by me prior to the procedure  Objective   Body mass index is 35.85 kg/m. Vitals:   07/21/23 0950  BP: 132/77  Pulse: 76  Resp: 14  Temp: 97.6 F (36.4 C)  TempSrc: Temporal  SpO2: 100%  Weight: 88.9 kg  Height: 5\' 2"  (1.575 m)     Physical Exam Vitals and nursing note reviewed.  Constitutional:      General: She is not in acute distress.    Appearance: Normal appearance. She is obese. She is not ill-appearing, toxic-appearing or diaphoretic.  HENT:     Head: Normocephalic and atraumatic.     Nose: Nose normal.      Mouth/Throat:     Mouth: Mucous membranes are moist.     Pharynx: Oropharynx is clear.  Eyes:     General: No scleral icterus.    Extraocular Movements: Extraocular movements intact.  Cardiovascular:     Rate and Rhythm: Normal rate and regular rhythm.     Heart sounds: Normal heart sounds. No murmur heard.    No friction rub. No gallop.  Pulmonary:     Effort: Pulmonary effort is normal. No respiratory distress.     Breath sounds: Normal breath sounds. No wheezing, rhonchi or rales.  Abdominal:     General: Bowel sounds are normal. There is no distension.     Palpations: Abdomen is soft.     Tenderness: There is no abdominal tenderness. There is no guarding or rebound.  Musculoskeletal:  Cervical back: Neck supple.     Right lower leg: No edema.     Left lower leg: No edema.  Skin:    General: Skin is warm and dry.     Coloration: Skin is not jaundiced or pale.  Neurological:     General: No focal deficit present.     Mental Status: She is alert and oriented to person, place, and time. Mental status is at baseline.  Psychiatric:        Mood and Affect: Mood normal.        Behavior: Behavior normal.        Thought Content: Thought content normal.        Judgment: Judgment normal.      Assessment:  Ms. Claudia Terry is a 78 y.o. female  who presents today for Esophagogastroduodenoscopy and Colonoscopy for Dysphagia, worsening GERD, family history of colon polyps .  Plan:  Esophagogastroduodenoscopy and Colonoscopy with possible intervention today  Esophagogastroduodenoscopy and Colonoscopy with possible biopsy, control of bleeding, polypectomy, and interventions as necessary has been discussed with the patient/patient representative. Informed consent was obtained from the patient/patient representative after explaining the indication, nature, and risks of the procedure including but not limited to death, bleeding, perforation, missed neoplasm/lesions, cardiorespiratory  compromise, and reaction to medications. Opportunity for questions was given and appropriate answers were provided. Patient/patient representative has verbalized understanding is amenable to undergoing the procedure.   Quintin Buckle, DO  [facility]  Portions of the record may have been created with voice recognition software. Occasional wrong-word or 'sound-a-like' substitutions may have occurred due to the inherent limitations of voice recognition software.  Read the chart carefully and recognize, using context, where substitutions may have occurred.

## 2023-07-21 ENCOUNTER — Ambulatory Visit: Payer: Medicare (Managed Care) | Admitting: Anesthesiology

## 2023-07-21 ENCOUNTER — Encounter
Admission: RE | Disposition: A | Discharge status: Home / Self Care | Payer: Self-pay | Source: Home / Self Care | Attending: Gastroenterology

## 2023-07-21 ENCOUNTER — Ambulatory Visit
Admission: RE | Admit: 2023-07-21 | Discharge: 2023-07-21 | Disposition: A | Discharge status: Home / Self Care | Payer: Medicare (Managed Care) | Attending: Gastroenterology | Admitting: Gastroenterology

## 2023-07-21 ENCOUNTER — Encounter: Payer: Self-pay | Admitting: Gastroenterology

## 2023-07-21 ENCOUNTER — Other Ambulatory Visit: Payer: Self-pay

## 2023-07-21 DIAGNOSIS — M1990 Unspecified osteoarthritis, unspecified site: Secondary | ICD-10-CM | POA: Diagnosis not present

## 2023-07-21 DIAGNOSIS — K219 Gastro-esophageal reflux disease without esophagitis: Secondary | ICD-10-CM | POA: Insufficient documentation

## 2023-07-21 DIAGNOSIS — Z1211 Encounter for screening for malignant neoplasm of colon: Secondary | ICD-10-CM | POA: Diagnosis not present

## 2023-07-21 DIAGNOSIS — G4730 Sleep apnea, unspecified: Secondary | ICD-10-CM | POA: Insufficient documentation

## 2023-07-21 DIAGNOSIS — I10 Essential (primary) hypertension: Secondary | ICD-10-CM | POA: Insufficient documentation

## 2023-07-21 DIAGNOSIS — G2581 Restless legs syndrome: Secondary | ICD-10-CM | POA: Insufficient documentation

## 2023-07-21 DIAGNOSIS — Z79899 Other long term (current) drug therapy: Secondary | ICD-10-CM | POA: Diagnosis not present

## 2023-07-21 DIAGNOSIS — K222 Esophageal obstruction: Secondary | ICD-10-CM | POA: Diagnosis not present

## 2023-07-21 DIAGNOSIS — K621 Rectal polyp: Secondary | ICD-10-CM | POA: Insufficient documentation

## 2023-07-21 DIAGNOSIS — R1310 Dysphagia, unspecified: Secondary | ICD-10-CM | POA: Diagnosis present

## 2023-07-21 DIAGNOSIS — Z83719 Family history of colon polyps, unspecified: Secondary | ICD-10-CM | POA: Insufficient documentation

## 2023-07-21 HISTORY — PX: ESOPHAGOGASTRODUODENOSCOPY (EGD) WITH PROPOFOL: SHX5813

## 2023-07-21 HISTORY — PX: POLYPECTOMY: SHX5525

## 2023-07-21 HISTORY — PX: COLONOSCOPY WITH PROPOFOL: SHX5780

## 2023-07-21 HISTORY — PX: MALONEY DILATION: SHX5535

## 2023-07-21 SURGERY — COLONOSCOPY WITH PROPOFOL
Anesthesia: General

## 2023-07-21 MED ORDER — DEXMEDETOMIDINE HCL IN NACL 80 MCG/20ML IV SOLN
INTRAVENOUS | Status: DC | PRN
  Administered 2023-07-21: 20 ug via INTRAVENOUS

## 2023-07-21 MED ORDER — PROPOFOL 1000 MG/100ML IV EMUL
INTRAVENOUS | Status: AC
  Filled 2023-07-21: qty 100

## 2023-07-21 MED ORDER — GLYCOPYRROLATE 0.2 MG/ML IJ SOLN
INTRAMUSCULAR | Status: DC | PRN
  Administered 2023-07-21: .2 mg via INTRAVENOUS

## 2023-07-21 MED ORDER — LIDOCAINE HCL (PF) 2 % IJ SOLN
INTRAMUSCULAR | Status: AC
  Filled 2023-07-21: qty 5

## 2023-07-21 MED ORDER — PROPOFOL 10 MG/ML IV BOLUS
INTRAVENOUS | Status: DC | PRN
  Administered 2023-07-21: 40 mg via INTRAVENOUS
  Administered 2023-07-21: 20 mg via INTRAVENOUS

## 2023-07-21 MED ORDER — GLYCOPYRROLATE 0.2 MG/ML IJ SOLN
INTRAMUSCULAR | Status: AC
  Filled 2023-07-21: qty 1

## 2023-07-21 MED ORDER — PROPOFOL 500 MG/50ML IV EMUL
INTRAVENOUS | Status: DC | PRN
  Administered 2023-07-21: 75 ug/kg/min via INTRAVENOUS

## 2023-07-21 MED ORDER — LIDOCAINE HCL (CARDIAC) PF 100 MG/5ML IV SOSY
PREFILLED_SYRINGE | INTRAVENOUS | Status: DC | PRN
  Administered 2023-07-21: 60 mg via INTRAVENOUS

## 2023-07-21 MED ORDER — SODIUM CHLORIDE 0.9 % IV SOLN: INTRAVENOUS | Status: DC

## 2023-07-21 NOTE — Anesthesia Postprocedure Evaluation (Signed)
 Anesthesia Post Note  Patient: Claudia Terry  Procedure(s) Performed: COLONOSCOPY WITH PROPOFOL  ESOPHAGOGASTRODUODENOSCOPY (EGD) WITH PROPOFOL  POLYPECTOMY MALONEY DILATION  Patient location during evaluation: PACU Anesthesia Type: General Level of consciousness: awake and alert Pain management: pain level controlled Vital Signs Assessment: post-procedure vital signs reviewed and stable Respiratory status: spontaneous breathing, nonlabored ventilation and respiratory function stable Cardiovascular status: blood pressure returned to baseline and stable Postop Assessment: no apparent nausea or vomiting Anesthetic complications: no   No notable events documented.   Last Vitals:  Vitals:   07/21/23 1134 07/21/23 1145  BP: 101/63 112/65  Pulse: 73 69  Resp: 20 12  Temp:    SpO2: 99% 100%    Last Pain:  Vitals:   07/21/23 1145  TempSrc:   PainSc: 0-No pain                 Baltazar Bonier

## 2023-07-21 NOTE — Interval H&P Note (Signed)
 History and Physical Interval Note: Preprocedure H&P from 07/21/23  was reviewed and there was no interval change after seeing and examining the patient.  Written consent was obtained from the patient after discussion of risks, benefits, and alternatives. Patient has consented to proceed with Esophagogastroduodenoscopy and Colonoscopy with possible intervention   07/21/2023 10:49 AM  Claudia Terry  has presented today for surgery, with the diagnosis of Z83.719 (ICD-10-CM) - FH: colon polyps K21.9 (ICD-10-CM) - Gastroesophageal reflux disease without esophagitis R13.10 (ICD-10-CM) - Dysphagia, unspecified type.  The various methods of treatment have been discussed with the patient and family. After consideration of risks, benefits and other options for treatment, the patient has consented to  Procedure(s): COLONOSCOPY WITH PROPOFOL  (N/A) ESOPHAGOGASTRODUODENOSCOPY (EGD) WITH PROPOFOL  (N/A) as a surgical intervention.  The patient's history has been reviewed, patient examined, no change in status, stable for surgery.  I have reviewed the patient's chart and labs.  Questions were answered to the patient's satisfaction.     Quintin Buckle

## 2023-07-21 NOTE — Op Note (Signed)
 [facility] Patient Name: Claudia Terry Procedure Date: 07/21/2023 10:43 AM MRN: [id number] Account #: [id number] Date of Birth: [birth year] Admit Type: Outpatient Age: 78 Room: [facility] ENDO ROOM 2 Gender: Female Note Status: Finalized Instrument Name: Upper Endoscope [id number] Procedure:             Upper GI endoscopy Indications:           Dysphagia Providers:             Quintin Buckle DO, DO Referring MD:          Rhesa Celeste MD (Referring MD) Medicines:             Monitored Anesthesia Care Complications:         No immediate complications. Estimated blood loss:                         Minimal. Procedure:             Pre-Anesthesia Assessment:                        - Prior to the procedure, a History and Physical was                         performed, and patient medications and allergies were                         reviewed. The patient is competent. The risks and                         benefits of the procedure and the sedation options and                         risks were discussed with the patient. All questions                         were answered and informed consent was obtained.                         Patient identification and proposed procedure were                         verified by the physician, the nurse, the anesthetist                         and the technician in the endoscopy suite. Mental                         Status Examination: alert and oriented. Airway                         Examination: normal oropharyngeal airway and neck                         mobility. Respiratory Examination: clear to                         auscultation. CV Examination: RRR, no murmurs, no S3  or S4. Prophylactic Antibiotics: The patient does not                         require prophylactic antibiotics. Prior                         Anticoagulants: The patient has taken no anticoagulant                          or antiplatelet agents. ASA Grade Assessment: II - A                         patient with mild systemic disease. After reviewing                         the risks and benefits, the patient was deemed in                         satisfactory condition to undergo the procedure. The                         anesthesia plan was to use monitored anesthesia care                         (MAC). Immediately prior to administration of                         medications, the patient was re-assessed for adequacy                         to receive sedatives. The heart rate, respiratory                         rate, oxygen saturations, blood pressure, adequacy of                         pulmonary ventilation, and response to care were                         monitored throughout the procedure. The physical                         status of the patient was re-assessed after the                         procedure.                        After obtaining informed consent, the endoscope was                         passed under direct vision. Throughout the procedure,                         the patient's blood pressure, pulse, and oxygen                         saturations were monitored continuously. The Endoscope  was introduced through the mouth, and advanced to the                         third part of duodenum. The upper GI endoscopy was                         accomplished without difficulty. The patient tolerated                         the procedure well. Findings:      The duodenal bulb, first portion of the duodenum and second portion of       the duodenum were normal. Estimated blood loss: none.      The entire examined stomach was normal. Estimated blood loss: none.      The Z-line was regular. Estimated blood loss: none.      Esophagogastric landmarks were identified: the gastroesophageal junction       was found at 37 cm from the incisors.      A widely patent Schatzki  ring was found at the gastroesophageal       junction. The scope was withdrawn. Dilation was performed with a Maloney       dilator with no resistance at 52 Fr. The dilation site was examined       following endoscope reinsertion and showed mild mucosal disruption. mild       disruption/superficial tear noted at schatski's ring and in the upper       esophageal sphincter. Estimated blood loss was minimal.      The exam of the esophagus was otherwise normal. Impression:            - Normal duodenal bulb, first portion of the duodenum                         and second portion of the duodenum.                        - Normal stomach.                        - Z-line regular.                        - Esophagogastric landmarks identified.                        - Widely patent Schatzki ring. Dilated.                        - No specimens collected. Recommendation:        - Patient has a contact number available for                         emergencies. The signs and symptoms of potential                         delayed complications were discussed with the patient.                         Return to normal activities tomorrow. Written  discharge instructions were provided to the patient.                        - Discharge patient to home.                        - Resume previous diet.                        - Continue present medications.                        - Repeat upper endoscopy PRN for retreatment.                        - Return to GI office as previously scheduled.                        - The findings and recommendations were discussed with                         the patient.                        - If any discomfort post procedure, recommend trial of                         chloraseptic drops or sprays. Can try viscous                         lidocaine  prescription if these do not help Procedure Code(s):     --- Professional ---                         [phone], Esophagogastroduodenoscopy, flexible,                         transoral; diagnostic, including collection of                         specimen(s) by brushing or washing, when performed                         (separate procedure)                        43450, Dilation of esophagus, by unguided sound or                         bougie, single or multiple passes Diagnosis Code(s):     --- Professional ---                        K22.2, Esophageal obstruction                        R13.10, Dysphagia, unspecified CPT copyright 2022 [employer]. All rights reserved. The codes documented in this report are preliminary and upon coder review may  be revised to meet current compliance requirements. Attending Participation:      I personally performed the entire procedure. Polo Brisk, DO Quintin Buckle DO, DO 07/21/2023 11:08:03 AM This report  has been signed electronically. Number of Addenda: 0 Note Initiated On: 07/21/2023 10:43 AM Estimated Blood Loss:  Estimated blood loss was minimal.      [facility]

## 2023-07-21 NOTE — Progress Notes (Signed)
 Brief GI Post op note  Pt with some discomfort with swallowing post egd and dilation She is tolerating liquids. Recommended soft food diet post procedure.  Examination of head, neck and chest is benign with no palpated crepitus. Vital signs are normal (normal HR and BP). Breathing is unlabored.  Will send viscous lidocaine  for pain/discomfort to her pharmacy  An opportunity for questions was provided and appropriate answers given.  I informed her of parameters to contact the office should any issues arise.  Jorje Newton, DO [facility]enter[facility]

## 2023-07-21 NOTE — Op Note (Signed)
 [facility] Gastroenterology Patient Name: Claudia Terry Procedure Date: 07/21/2023 10:42 AM MRN: [id number] Account #: [id number] Date of Birth: [birth year] Admit Type: Outpatient Age: 78 Room: [facility] ENDO ROOM 2 Gender: Female Note Status: Finalized Instrument Name: Charlyn Cooley [id number] Procedure:             Colonoscopy Indications:           Colon cancer screening in patient at increased risk:                         Family history of 1st-degree relative with colon polyps Providers:             Quintin Buckle DO, DO Referring MD:          Rhesa Celeste MD (Referring MD) Medicines:             Monitored Anesthesia Care Complications:         No immediate complications. Estimated blood loss:                         Minimal. Procedure:             Pre-Anesthesia Assessment:                        - Prior to the procedure, a History and Physical was                         performed, and patient medications and allergies were                         reviewed. The patient is competent. The risks and                         benefits of the procedure and the sedation options and                         risks were discussed with the patient. All questions                         were answered and informed consent was obtained.                         Patient identification and proposed procedure were                         verified by the physician, the nurse, the anesthetist                         and the technician in the endoscopy suite. Mental                         Status Examination: alert and oriented. Airway                         Examination: normal oropharyngeal airway and neck                         mobility. Respiratory Examination: clear to  auscultation. CV Examination: RRR, no murmurs, no S3                         or S4. Prophylactic Antibiotics: The patient does not                         require prophylactic  antibiotics. Prior                         Anticoagulants: The patient has taken no anticoagulant                         or antiplatelet agents. ASA Grade Assessment: II - A                         patient with mild systemic disease. After reviewing                         the risks and benefits, the patient was deemed in                         satisfactory condition to undergo the procedure. The                         anesthesia plan was to use monitored anesthesia care                         (MAC). Immediately prior to administration of                         medications, the patient was re-assessed for adequacy                         to receive sedatives. The heart rate, respiratory                         rate, oxygen saturations, blood pressure, adequacy of                         pulmonary ventilation, and response to care were                         monitored throughout the procedure. The physical                         status of the patient was re-assessed after the                         procedure.                        After obtaining informed consent, the colonoscope was                         passed under direct vision. Throughout the procedure,                         the patient's blood pressure, pulse, and oxygen  saturations were monitored continuously. The                         Colonoscope was introduced through the anus and                         advanced to the the cecum, identified by appendiceal                         orifice and ileocecal valve. The colonoscopy was                         performed without difficulty. The patient tolerated                         the procedure well. The quality of the bowel                         preparation was evaluated using the BBPS [facility] Bowel                         Preparation Scale) with scores of: Right Colon = 3,                         Transverse Colon = 3 and Left Colon = 3 (entire  mucosa                         seen well with no residual staining, small fragments                         of stool or opaque liquid). The total BBPS score                         equals 9. The ileocecal valve, appendiceal orifice,                         and rectum were photographed. Findings:      The perianal and digital rectal examinations were normal. Pertinent       negatives include normal sphincter tone.      A 1 mm polyp was found in the rectum. The polyp was sessile. The polyp       was removed with a jumbo cold forceps. Resection and retrieval were       complete. Estimated blood loss was minimal.      The exam was otherwise without abnormality on direct and retroflexion       views. Impression:            - One 1 mm polyp in the rectum, removed with a jumbo                         cold forceps. Resected and retrieved.                        - The examination was otherwise normal on direct and                         retroflexion views. Recommendation:        -  Patient has a contact number available for                         emergencies. The signs and symptoms of potential                         delayed complications were discussed with the patient.                         Return to normal activities tomorrow. Written                         discharge instructions were provided to the patient.                        - Discharge patient to home.                        - Soft diet today.                        - Continue present medications.                        - No ibuprofen, naproxen, or other non-steroidal                         anti-inflammatory drugs for 5 days after polyp removal.                        - Await pathology results.                        - Repeat colonoscopy for surveillance based on                         pathology results.                        - Return to referring physician as previously                         scheduled.                         - The findings and recommendations were discussed with                         the patient. Procedure Code(s):     --- Professional ---                        [phone], Colonoscopy, flexible; with biopsy, single or                         multiple Diagnosis Code(s):     --- Professional ---                        Z83.71, Family history of colonic polyps                        D12.8, Benign neoplasm of rectum  CPT copyright 2022 American Medical Association. All rights reserved. The codes documented in this report are preliminary and upon coder review may  be revised to meet current compliance requirements. Attending Participation:      I personally performed the entire procedure. Polo Brisk, DO Quintin Buckle DO, DO 07/21/2023 11:32:06 AM This report has been signed electronically. Number of Addenda: 0 Note Initiated On: 07/21/2023 10:42 AM Scope Withdrawal Time: 0 hours 9 minutes 25 seconds  Total Procedure Duration: 0 hours 13 minutes 21 seconds  Estimated Blood Loss:  Estimated blood loss was minimal.      [facility]

## 2023-07-21 NOTE — Anesthesia Preprocedure Evaluation (Addendum)
 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 on medications and Pt. on home beta blockers Normal cardiovascular exam     Neuro/Psych Restless leg syndrome  negative psych ROS   GI/Hepatic Neg liver ROS,GERD  Medicated and Controlled,,  Endo/Other  BMI 32  Renal/GU negative Renal ROS     Musculoskeletal  (+) Arthritis ,    Abdominal  (+) + obese  Peds  Hematology negative hematology ROS (+)   Anesthesia Other Findings   Reproductive/Obstetrics                             Anesthesia Physical Anesthesia Plan  ASA: II  Anesthesia Plan: General   Post-op Pain Management: Minimal or no pain anticipated   Induction: Intravenous  PONV Risk Score and Plan: 2 and Treatment may vary due to age or medical condition  Airway Management Planned: Natural Airway  Additional Equipment: None  Intra-op Plan:   Post-operative Plan:   Informed Consent: I have reviewed the patients History and Physical, chart, labs and discussed the procedure including the risks, benefits and alternatives for the proposed anesthesia with the patient or authorized representative who has indicated his/her understanding and acceptance.       Plan Discussed with: CRNA and Anesthesiologist  Anesthesia Plan Comments:         Anesthesia Quick Evaluation

## 2023-07-21 NOTE — Transfer of Care (Signed)
 Immediate Anesthesia Transfer of Care Note  Patient: Claudia Terry  Procedure(s) Performed: COLONOSCOPY WITH PROPOFOL  ESOPHAGOGASTRODUODENOSCOPY (EGD) WITH PROPOFOL  POLYPECTOMY MALONEY DILATION  Patient Location: PACU  Anesthesia Type:General  Level of Consciousness: awake  Airway & Oxygen Therapy: Patient Spontanous Breathing  Post-op Assessment: Report given to RN and Post -op Vital signs reviewed and stable  Post vital signs: Reviewed and stable  Last Vitals:  Vitals Value Taken Time  BP 82/42 07/21/23 1128  Temp    Pulse 72 07/21/23 1129  Resp 20 07/21/23 1129  SpO2 99 % 07/21/23 1129  Vitals shown include unfiled device data.  Last Pain:  Vitals:   07/21/23 1127  TempSrc:   PainSc: 0-No pain         Complications: No notable events documented.

## 2023-07-22 ENCOUNTER — Encounter: Payer: Self-pay | Admitting: Gastroenterology

## 2023-07-22 LAB — SURGICAL PATHOLOGY

## 2023-10-24 ENCOUNTER — Telehealth: Payer: Self-pay

## 2023-10-24 NOTE — Telephone Encounter (Signed)
 Patient called stated she had hit a door on her rt breast & it has been sore for a few weeks. No bruising but it is still sore. Patient would like an appt to have it checked by Dr Colvin Dec. Patient has also been dealing with lymphedema (foot/leg)& working with a couple of drs with no relief. Patient stated she would just ask Dr Colvin Dec at that appointment if she had any recommendations.  Appt Thursday 10-30-23 with Dr Colvin Dec

## 2023-10-28 ENCOUNTER — Encounter: Payer: Self-pay | Admitting: Obstetrics and Gynecology

## 2023-10-28 ENCOUNTER — Encounter (INDEPENDENT_AMBULATORY_CARE_PROVIDER_SITE_OTHER): Payer: Self-pay

## 2023-10-29 NOTE — Progress Notes (Signed)
 GYNECOLOGY  VISIT   HPI: 78 y.o.   Married  Caucasian female   G2P2002 with Patient's last menstrual period was 02/08/2005 (approximate).   here for: Breast Discomfort.  Asking for consultation regarding lymphedema.  Cabinet slammed against her breast 1 month ago.  No lump.   Had right foot surgery, arthritis, and has bilateral lymphedema.  Has pain.  Takes Tylenol .  Sees vascular specialists.      Uses a stationary bike.  Patient is a Engineer, civil (consulting).  GYNECOLOGIC HISTORY: Patient's last menstrual period was 02/08/2005 (approximate). Contraception:  PMP Menopausal hormone therapy:  n/a Last 2 paps:  04/19/21 neg , 03/04/18 neg  History of abnormal Pap or positive HPV:  no Mammogram:  05/22/22 Breast Density Cat A, BIRADS Cat 1 neg         OB History     Gravida  2   Para  2   Term  2   Preterm  0   AB  0   Living  2      SAB  0   IAB  0   Ectopic  0   Multiple  0   Live Births  2              Patient Active Problem List   Diagnosis Date Noted   Lymphedema 12/06/2022   Pain, postoperative, acute 11/16/2021   Generalized osteoarthritis of multiple sites 07/28/2018   Varicose veins of both legs with edema 07/28/2018   FH: colon polyps 02/10/2018   S/P total knee arthroplasty 07/07/2017   Status post total left knee replacement 10/28/2016   Failed total hip arthroplasty with dislocation (HCC) 12/01/2015   S/P closed reduction of dislocated total hip prosthesis 08/08/2015   S/P total hip arthroplasty 06/14/2015   Benign essential HTN 12/28/2013   Acid reflux 12/28/2013   HLD (hyperlipidemia) 12/28/2013   Restless leg syndrome 12/28/2013   Depression 1999    Past Medical History:  Diagnosis Date   Arthritis    Complication of anesthesia    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 Dawne Euler   History of 2019 novel coronavirus disease (COVID-19) 01/15/2021   Hypertension    Low back pain 04/1994    Lymphedema    Menopause    Osteoarthritis    Restless leg syndrome 1999    Past Surgical History:  Procedure Laterality Date   ACHILLES TENDON SURGERY Right 11/16/2021   Procedure: PTAL - ACHILLES TENDON MICROTENOTOMY;  Surgeon: Pink Bridges, DPM;  Location: [facility] ORS;  Service: Podiatry;  Laterality: Right;   ARTHRODESIS METATARSAL Right 11/16/2021   Procedure: ARTHRODESIS METATARSAL, T-N, C-C OR MET-CUNNEIFORM, NCJ;  Surgeon: Pink Bridges, DPM;  Location: [facility] ORS;  Service: Podiatry;  Laterality: Right;   CATARACT EXTRACTION     CATARACT EXTRACTION W/PHACO Left 08/15/2020   Procedure: CATARACT EXTRACTION PHACO AND INTRAOCULAR LENS PLACEMENT (IOC) LEFT 6.60 00:39.8;  Surgeon: Clair Crews, MD;  Location: [facility];  Service: Ophthalmology;  Laterality: Left;   CATARACT EXTRACTION W/PHACO Right 08/29/2020   Procedure: CATARACT EXTRACTION PHACO AND INTRAOCULAR LENS PLACEMENT (IOC) RIGHT 6.94 00:42.3;  Surgeon: Clair Crews, MD;  Location: [facility];  Service: Ophthalmology;  Laterality: Right;   CESAREAN SECTION  1972, 1975   X2   COLONOSCOPY  12/06/2011   normal   COLONOSCOPY WITH PROPOFOL  N/A 04/13/2018   Procedure: COLONOSCOPY WITH PROPOFOL ;  Surgeon: Cassie Click, MD;  Location:  [facility] ENDOSCOPY;  Service: Endoscopy;  Laterality: N/A;   COLONOSCOPY WITH PROPOFOL  N/A 07/21/2023   Procedure: COLONOSCOPY WITH PROPOFOL ;  Surgeon: Quintin Buckle, DO;  Location: [facility] ENDOSCOPY;  Service: Gastroenterology;  Laterality: N/A;   ESOPHAGOGASTRODUODENOSCOPY (EGD) WITH PROPOFOL  N/A 02/23/2015   Procedure: ESOPHAGOGASTRODUODENOSCOPY (EGD) WITH PROPOFOL ;  Surgeon: Cassie Click, MD;  Location: [facility] ENDOSCOPY;  Service: Endoscopy;  Laterality: N/A;   ESOPHAGOGASTRODUODENOSCOPY (EGD) WITH PROPOFOL  N/A 04/13/2018   Procedure: ESOPHAGOGASTRODUODENOSCOPY (EGD) WITH PROPOFOL ;  Surgeon: Cassie Click, MD;  Location: [facility] END[facility];  Service: Endoscopy;   Laterality: N/A;   ESOPHAGOGASTRODUODENOSCOPY (EGD) WITH PROPOFOL  N/A 07/21/2023   Procedure: ESOPHAGOGASTRODUODENOSCOPY (EGD) WITH PROPOFOL ;  Surgeon: Quintin Buckle, DO;  Location: [facility] ENDOSCOPY;  Service: Gastroenterology;  Laterality: N/A;   FOOT ARTHRODESIS Right 11/16/2021   Procedure: ARTHRODESIS FOOT; TRIPLE;  Surgeon: Pink Bridges, DPM;  Location: [facility] ORS;  Service: Podiatry;  Laterality: Right;   FOOT SURGERY     with screws   HARDWARE REMOVAL Right 11/16/2021   Procedure: HARDWARE REMOVAL;  Surgeon: Pink Bridges, DPM;  Location: [facility] ORS;  Service: Podiatry;  Laterality: Right;   HIP CLOSED REDUCTION Left 08/08/2015   Procedure: CLOSED REDUCTION HIP;  Surgeon: Arlyne Lame, MD;  Location: [facility] ORS;  Service: Orthopedics;  Laterality: Left;   HIP CLOSED REDUCTION Left 09/18/2015   Procedure: CLOSED REDUCTION HIP;  Surgeon: Molli Angelucci, MD;  Location: [facility] ORS;  Service: Orthopedics;  Laterality: Left;   HIP CLOSED REDUCTION Left 12/01/2015   Procedure: CLOSED REDUCTION HIP;  Surgeon: Arlyne Lame, MD;  Location: [facility] ORS;  Service: Orthopedics;  Laterality: Left;  no prep no incision   JOINT REPLACEMENT  10/2016   left knee   KNEE ARTHROPLASTY Left 10/28/2016   Procedure: COMPUTER ASSISTED TOTAL KNEE ARTHROPLASTY;  Surgeon: Arlyne Lame, MD;  Location: [facility] ORS;  Service: Orthopedics;  Laterality: Left;   KNEE ARTHROPLASTY Right 07/07/2017   Procedure: COMPUTER ASSISTED TOTAL KNEE ARTHROPLASTY;  Surgeon: Arlyne Lame, MD;  Location: [facility] ORS;  Service: Orthopedics;  Laterality: Right;   KNEE ARTHROSCOPY Right 11/14/2014   Procedure: ARTHROSCOPY KNEE;  Surgeon: Arlyne Lame, MD;  Location: [facility] OR[facility];  Service: Orthopedics;  Laterality: Right;  Posterior horn menicus, anterior lateral menicus grade 3 chondromalacia   MALONEY DILATION  07/21/2023   Procedure: MALONEY DILATION;  Surgeon: Quintin Buckle, DO;  Location: [facility] ENDOSCOPY;  Service:  Gastroenterology;;   phlebitis groin     POLYPECTOMY  07/21/2023   Procedure: POLYPECTOMY;  Surgeon: Quintin Buckle, DO;  Location: [facility] ENDOSCOPY;  Service: Gastroenterology;;   right foot surgery     11/2021   SHOULDER ARTHROSCOPY WITH OPEN ROTATOR CUFF REPAIR Left 03/10/2019   Procedure: LEFT SHOULDER ARTHROSCOPY, ARTHROSCOPIC ROTATOR CUFF REPAIR, DISTAL CLAVICLE EXCISION, SUBACROMIAL DECOMPRESSION, INTRA-ARTICULAR DEBRIDEMENT ;  Surgeon: Jerlyn Moons, MD;  Location: [facility] ORS;  Service: Orthopedics;  Laterality: Left;   SHOULDER ARTHROSCOPY WITH ROTATOR CUFF REPAIR Left 06/21/2019   Procedure: SHOULDER ARTHROSCOPY WITH EXTENSIVE INTRAARTICULAR DEBRIDEMENT WITH REMOVAL OF HARDWARE;  Surgeon: Jerlyn Moons, MD;  Location: [facility];  Service: Orthopedics;  Laterality: Left;   TONSILLECTOMY     TOTAL HIP ARTHROPLASTY Left 05/08/2011   necrosis of hip   TOTAL HIP ARTHROPLASTY Right 06/14/2015   Procedure: TOTAL HIP ARTHROPLASTY;  Surgeon: Arlyne Lame, MD;  Location: [facility] OR[facility];  Service: Orthopedics;  Laterality: Right;   TOTAL HIP REVISION Left 02/21/2016   Procedure: TOTAL HIP REVISION;  Surgeon: Arlyne Lame, MD;  Location: [facility] ORS;  Service: Orthopedics;  Laterality: Left;    Current Outpatient Medications  Medication Sig Dispense Refill   acetaminophen  (TYLENOL ) 500 MG tablet Take 1,000 mg by mouth every 6 (six) hours as needed.     atenolol  (TENORMIN ) 50 MG tablet daily.     betamethasone  dipropionate 0.05 % cream Apply to affected areas itchy bumps twice daily until improved. Avoid applying to face, groin, and axilla. Use as directed. Long-term use can cause thinning of the skin. 45 g 0   Biotin 5000 MCG TABS Take 10,000 mcg by mouth daily.     Calcium  Carbonate-Vitamin D  (CALTRATE 600+D PO) Take by mouth 2 (two) times daily.     clindamycin  (CLEOCIN ) 300 MG capsule Take 300 mg by mouth daily. For dental     Coenzyme Q10 (CO Q-10) 200 MG CAPS Take 100 mg by  mouth daily.     Glucosamine HCl-MSM 1500-500 MG/30ML LIQD Take by mouth 2 (two) times daily.     hydrOXYzine  (ATARAX ) 10 MG tablet Take 1-2 tablets by mouth at night as needed for itching. May make drowsy. 60 tablet 0   Misc. Devices (COMMODE BEDSIDE) MISC 1 Device by Does not apply route as needed. 3:1 BSC 1 each 0   omeprazole (PRILOSEC) 20 MG capsule Take by mouth.     pramipexole  (MIRAPEX ) 1.5 MG tablet Take 1.5 mg by mouth at bedtime.      Red Yeast Rice 600 MG TABS Take 600 mg by mouth 2 (two) times daily.     traZODone  (DESYREL ) 50 MG tablet trazodone  50 mg tablet     No current facility-administered medications for this visit.     ALLERGIES: Penicillins, Erythromycin, Other, and Tetracyclines & related  Family History  Problem Relation Age of Onset   Diabetes Mother    Osteoarthritis Mother    Diabetes Maternal Grandfather    Hypertension Maternal Grandfather    Ovarian cancer Paternal Aunt    Cancer - Lung Paternal Uncle     Social History   Socioeconomic History   Marital status: Married    Spouse name: Not on file   Number of children: Not on file   Years of education: Not on file   Highest education level: Not on file  Occupational History   Not on file  Tobacco Use   Smoking status: Never   Smokeless tobacco: Never  Vaping Use   Vaping status: Never Used  Substance and Sexual Activity   Alcohol use: No   Drug use: No   Sexual activity: Yes    Partners: Male    Birth control/protection: Post-menopausal    Comment: husband vasectomy  Other Topics Concern   Not on file  Social History Narrative   Not on file   Social Drivers of Health   Financial Resource Strain: Low Risk  (07/25/2023)   Received from [facility]   Overall Financial Resource Strain (CARDIA)    Difficulty of Paying Living Expenses: Not hard at all  Food Insecurity: No Food Insecurity (07/25/2023)   Received from [facility]   Hunger Vital Sign     Worried About Running Out of Food in the Last Year: Never true    Ran Out of Food in the Last Year: Never true  Transportation Needs: No Transportation Needs (07/25/2023)   Received from [facility] - Transportation    In the past 12 months, has  lack of transportation kept you from medical appointments or from getting medications?: No    Lack of Transportation (Non-Medical): No  Physical Activity: Not on file  Stress: Not on file  Social Connections: Not on file  Intimate Partner Violence: Not on file    Review of Systems  Genitourinary:        Right breast soreness  All other systems reviewed and are negative.   PHYSICAL EXAMINATION:   BP 124/70   Pulse 68   Resp 14   Wt 197 lb (89.4 kg)   LMP 02/08/2005 (Approximate)   BMI 36.03 kg/m     General appearance: alert, cooperative and appears stated age   Breasts: normal appearance, no masses or tenderness, No nipple retraction or dimpling, No nipple discharge or bleeding, No axillary or supraclavicular adenopathy   LEs:  significant edema bilaterally.    Chaperone was present for exam:  Nelva Bang, RN  ASSESSMENT:  Right breast pain.  Status post trauma.  No mass palpated today.  Lymphedema.   PLAN:  Will order dx right mammogram and right breast US  at [facility].  I did a query on Up to Date and printed material for the patient on treatment of lymphedema. Return for breast and pelvic exam.   24 min  total time was spent for this patient encounter, including preparation, face-to-face counseling with the patient, coordination of care, and documentation of the encounter.

## 2023-10-30 ENCOUNTER — Encounter: Payer: Self-pay | Admitting: Obstetrics and Gynecology

## 2023-10-30 ENCOUNTER — Telehealth: Payer: Self-pay | Admitting: Obstetrics and Gynecology

## 2023-10-30 ENCOUNTER — Ambulatory Visit: Payer: Medicare (Managed Care) | Admitting: Obstetrics and Gynecology

## 2023-10-30 VITALS — BP 124/70 | HR 68 | Resp 14 | Wt 197.0 lb

## 2023-10-30 DIAGNOSIS — I890 Lymphedema, not elsewhere classified: Secondary | ICD-10-CM

## 2023-10-30 DIAGNOSIS — N644 Mastodynia: Secondary | ICD-10-CM | POA: Diagnosis not present

## 2023-10-30 NOTE — Telephone Encounter (Signed)
 Order written up and placed on provider's desk for authorization.

## 2023-10-30 NOTE — Telephone Encounter (Signed)
 Order authorized by [facility] and faxed successfully over to [facility]. They will contact the pt to schedule. Encounter closed.

## 2023-10-30 NOTE — Telephone Encounter (Signed)
 Please schedule dx right mammogram and right breast US  at [facility] for my patient.   She has right lateral breast pain after a cabinet door hit her breast.

## 2023-11-04 ENCOUNTER — Encounter: Payer: Self-pay | Admitting: Obstetrics and Gynecology

## 2023-11-04 ENCOUNTER — Ambulatory Visit: Payer: Self-pay | Admitting: Obstetrics and Gynecology

## 2023-11-04 DIAGNOSIS — E2839 Other primary ovarian failure: Secondary | ICD-10-CM

## 2023-11-06 ENCOUNTER — Ambulatory Visit: Payer: Self-pay | Admitting: Obstetrics and Gynecology

## 2023-11-06 NOTE — Progress Notes (Signed)
 Referral to [facility] placed on providers desk for signature.

## 2023-11-11 LAB — HM DEXA SCAN

## 2023-11-18 ENCOUNTER — Encounter: Payer: Self-pay | Admitting: Obstetrics and Gynecology

## 2023-11-18 ENCOUNTER — Telehealth: Payer: Self-pay | Admitting: Obstetrics and Gynecology

## 2023-11-18 NOTE — Telephone Encounter (Signed)
 Left message to call back

## 2023-11-18 NOTE — Telephone Encounter (Signed)
 Patient notified of esults. Patient states she has an aex 12-17-23. I told patient it would be good for her to schedule a separate appointment to discuss her bone density. Patient to call to schedule.

## 2023-11-18 NOTE — Telephone Encounter (Signed)
 Please contact patient regarding her bone density from [facility] 11/11/23 showing osteoporosis of her left wrist and osteopenia of right right wrist.   She has had a stress fracture of her foot.   I recommend an office visit with me to discuss this further and review potential treatment options to reduce risk of fracture.

## 2023-11-19 ENCOUNTER — Ambulatory Visit: Admitting: Obstetrics and Gynecology

## 2023-12-01 ENCOUNTER — Other Ambulatory Visit (HOSPITAL_BASED_OUTPATIENT_CLINIC_OR_DEPARTMENT_OTHER)

## 2023-12-05 ENCOUNTER — Encounter: Payer: Self-pay | Admitting: Obstetrics and Gynecology

## 2023-12-09 ENCOUNTER — Ambulatory Visit (INDEPENDENT_AMBULATORY_CARE_PROVIDER_SITE_OTHER): Payer: Medicare HMO | Admitting: Vascular Surgery

## 2023-12-16 ENCOUNTER — Encounter (INDEPENDENT_AMBULATORY_CARE_PROVIDER_SITE_OTHER): Payer: Self-pay | Admitting: Vascular Surgery

## 2023-12-16 ENCOUNTER — Ambulatory Visit (INDEPENDENT_AMBULATORY_CARE_PROVIDER_SITE_OTHER): Payer: Medicare (Managed Care) | Admitting: Vascular Surgery

## 2023-12-16 VITALS — BP 134/82 | HR 72 | Ht 61.5 in | Wt 200.1 lb

## 2023-12-16 DIAGNOSIS — I83893 Varicose veins of bilateral lower extremities with other complications: Secondary | ICD-10-CM | POA: Diagnosis not present

## 2023-12-16 DIAGNOSIS — I10 Essential (primary) hypertension: Secondary | ICD-10-CM

## 2023-12-16 DIAGNOSIS — I890 Lymphedema, not elsewhere classified: Secondary | ICD-10-CM | POA: Diagnosis not present

## 2023-12-16 NOTE — Assessment & Plan Note (Signed)
 Patient has quite severe lymphedema and has had multiple previous foot surgeries.  It sounds like she might need another foot surgery with prolonged immobility which will worsen her swelling further.  Thankfully, she does not have any skin threat, cellulitis, or major skin changes.  She will continue to use her lymphedema pump 2-3 times a day.  She will elevate is much as possible.  If she can get some sort of compression garment on that would be helpful.  I will plan to see her back in 6 months.  She does have an upcoming long trip to [place]  and she is going to try to get out and walk is much as possible and this is going to be quite painful.  I did give her some pain medication for the short-term but will not be doing this long-term.

## 2023-12-16 NOTE — Progress Notes (Signed)
 MRN : [id number]  Claudia Terry is a 78 y.o. (13-Sep-1945) female who presents with chief complaint of  Chief Complaint  Patient presents with   F/u 6 months  no studies  .  History of Present Illness: Patient returns today in follow up of her leg swelling.  Her mobility is still not very good and her leg swelling remains quite prominent.  She uses her lymphedema pumps at least twice a day every day.  She cannot get compression socks on over her arthritic trouble feet and her husband recently had shoulder replacements that he cannot help her.  She does elevate her legs religiously.  No new ulceration or infection.  No fever or chills.  No chest pain or shortness of breath.  Previous venous study was unrevealing.  Current Outpatient Medications  Medication Sig Dispense Refill   acetaminophen  (TYLENOL ) 500 MG tablet Take 1,000 mg by mouth every 6 (six) hours as needed.     atenolol  (TENORMIN ) 50 MG tablet daily.     betamethasone  dipropionate 0.05 % cream Apply to affected areas itchy bumps twice daily until improved. Avoid applying to face, groin, and axilla. Use as directed. Long-term use can cause thinning of the skin. 45 g 0   Biotin 5000 MCG TABS Take 10,000 mcg by mouth daily.     Calcium  Carbonate-Vitamin D  (CALTRATE 600+D PO) Take by mouth 2 (two) times daily.     clindamycin  (CLEOCIN ) 300 MG capsule Take 300 mg by mouth daily. For dental     Coenzyme Q10 (CO Q-10) 200 MG CAPS Take 100 mg by mouth daily.     Glucosamine HCl-MSM 1500-500 MG/30ML LIQD Take by mouth 2 (two) times daily.     hydrOXYzine  (ATARAX ) 10 MG tablet Take 1-2 tablets by mouth at night as needed for itching. May make drowsy. 60 tablet 0   Misc. Devices (COMMODE BEDSIDE) MISC 1 Device by Does not apply route as needed. 3:1 BSC 1 each 0   omeprazole (PRILOSEC) 20 MG capsule Take by mouth.     pramipexole  (MIRAPEX ) 1.5 MG tablet Take 1.5 mg by mouth at bedtime.      Red Yeast Rice 600 MG TABS Take 600 mg by mouth 2  (two) times daily.     traZODone  (DESYREL ) 50 MG tablet trazodone  50 mg tablet     No current facility-administered medications for this visit.    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 Geryl   History of 2019 novel coronavirus disease (COVID-19) 01/15/2021   Hypertension    Low back pain 04/1994   Lymphedema    Menopause    Osteoarthritis    Osteoporosis 2025   left radius   Restless leg syndrome 1999    Past Surgical History:  Procedure Laterality Date   ACHILLES TENDON SURGERY Right 11/16/2021   Procedure: PTAL - ACHILLES TENDON MICROTENOTOMY;  Surgeon: Lennie Barter, DPM;  Location: [facility];  Service: Podiatry;  Laterality: Right;   ARTHRODESIS METATARSAL Right 11/16/2021   Procedure: ARTHRODESIS METATARSAL, T-N, C-C OR MET-CUNNEIFORM, NCJ;  Surgeon: Lennie Barter, DPM;  Location: [facility];  Service: Podiatry;  Laterality: Right;   CATARACT EXTRACTION     CATARACT EXTRACTION W/PHACO Left 08/15/2020   Procedure: CATARACT EXTRACTION PHACO AND INTRAOCULAR LENS PLACEMENT (IOC) LEFT 6.60 00:39.8;  Surgeon: Jaye Fallow,  MD;  Location: [facility];  Service: Ophthalmology;  Laterality: Left;   CATARACT EXTRACTION W/PHACO Right 08/29/2020   Procedure: CATARACT EXTRACTION PHACO AND INTRAOCULAR LENS PLACEMENT (IOC) RIGHT 6.94 00:42.3;  Surgeon: Jaye Fallow, MD;  Location: [facility];  Service: Ophthalmology;  Laterality: Right;   CESAREAN SECTION  1972, 1975   X2   COLONOSCOPY  12/06/2011   normal   COLONOSCOPY WITH PROPOFOL  N/A 04/13/2018   Procedure: COLONOSCOPY WITH PROPOFOL ;  Surgeon: Viktoria Lamar DASEN, MD;  Location: [facility];  Service: Endoscopy;  Laterality: N/A;   COLONOSCOPY WITH PROPOFOL  N/A 07/21/2023   Procedure: COLONOSCOPY WITH PROPOFOL ;  Surgeon: Onita Elspeth Sharper, DO;  Location: [facility]  ENDOSCOPY;  Service: Gastroenterology;  Laterality: N/A;   ESOPHAGOGASTRODUODENOSCOPY (EGD) WITH PROPOFOL  N/A 02/23/2015   Procedure: ESOPHAGOGASTRODUODENOSCOPY (EGD) WITH PROPOFOL ;  Surgeon: Lamar DASEN Viktoria, MD;  Location: [facility] ENDOSCOPY;  Service: Endoscopy;  Laterality: N/A;   ESOPHAGOGASTRODUODENOSCOPY (EGD) WITH PROPOFOL  N/A 04/13/2018   Procedure: ESOPHAGOGASTRODUODENOSCOPY (EGD) WITH PROPOFOL ;  Surgeon: Viktoria Lamar DASEN, MD;  Location: [facility];  Service: Endoscopy;  Laterality: N/A;   ESOPHAGOGASTRODUODENOSCOPY (EGD) WITH PROPOFOL  N/A 07/21/2023   Procedure: ESOPHAGOGASTRODUODENOSCOPY (EGD) WITH PROPOFOL ;  Surgeon: Onita Elspeth Sharper, DO;  Location: [facility] ENDOSCOPY;  Service: Gastroenterology;  Laterality: N/A;   FOOT ARTHRODESIS Right 11/16/2021   Procedure: ARTHRODESIS FOOT; TRIPLE;  Surgeon: Lennie Barter, DPM;  Location: [facility];  Service: Podiatry;  Laterality: Right;   FOOT SURGERY     with screws   HARDWARE REMOVAL Right 11/16/2021   Procedure: HARDWARE REMOVAL;  Surgeon: Lennie Barter, DPM;  Location: [facility];  Service: Podiatry;  Laterality: Right;   HIP CLOSED REDUCTION Left 08/08/2015   Procedure: CLOSED REDUCTION HIP;  Surgeon: Lynwood SHAUNNA Hue, MD;  Location: [facility];  Service: Orthopedics;  Laterality: Left;   HIP CLOSED REDUCTION Left 09/18/2015   Procedure: CLOSED REDUCTION HIP;  Surgeon: Sharper Flake, MD;  Location: [facility];  Service: Orthopedics;  Laterality: Left;   HIP CLOSED REDUCTION Left 12/01/2015   Procedure: CLOSED REDUCTION HIP;  Surgeon: Lynwood SHAUNNA Hue, MD;  Location: [facility];  Service: Orthopedics;  Laterality: Left;  no prep no incision   JOINT REPLACEMENT  10/2016   left knee   KNEE ARTHROPLASTY Left 10/28/2016   Procedure: COMPUTER ASSISTED TOTAL KNEE ARTHROPLASTY;  Surgeon: Hue Lynwood SHAUNNA, MD;  Location: [facility];  Service: Orthopedics;  Laterality: Left;   KNEE ARTHROPLASTY Right 07/07/2017   Procedure: COMPUTER ASSISTED TOTAL KNEE  ARTHROPLASTY;  Surgeon: Hue Lynwood SHAUNNA, MD;  Location: [facility];  Service: Orthopedics;  Laterality: Right;   KNEE ARTHROSCOPY Right 11/14/2014   Procedure: ARTHROSCOPY KNEE;  Surgeon: Lynwood SHAUNNA Hue, MD;  Location: [facility];  Service: Orthopedics;  Laterality: Right;  Posterior horn menicus, anterior lateral menicus grade 3 chondromalacia   MALONEY DILATION  07/21/2023   Procedure: MALONEY DILATION;  Surgeon: Onita Elspeth Sharper, DO;  Location: [facility] ENDOSCOPY;  Service: Gastroenterology;;   phlebitis groin     POLYPECTOMY  07/21/2023   Procedure: POLYPECTOMY;  Surgeon: Onita Elspeth Sharper, DO;  Location: [facility];  Service: Gastroenterology;;   right foot surgery     11/2021   SHOULDER ARTHROSCOPY WITH OPEN ROTATOR CUFF REPAIR Left 03/10/2019   Procedure: LEFT SHOULDER ARTHROSCOPY, ARTHROSCOPIC ROTATOR CUFF REPAIR, DISTAL CLAVICLE EXCISION, SUBACROMIAL DECOMPRESSION, INTRA-ARTICULAR DEBRIDEMENT ;  Surgeon: Leora Lynwood SAUNDERS, MD;  Location: [facility];  Service: Orthopedics;  Laterality: Left;   SHOULDER ARTHROSCOPY WITH ROTATOR CUFF REPAIR Left 06/21/2019  Procedure: SHOULDER ARTHROSCOPY WITH EXTENSIVE INTRAARTICULAR DEBRIDEMENT WITH REMOVAL OF HARDWARE;  Surgeon: Leora Lynwood SAUNDERS, MD;  Location: [facility];  Service: Orthopedics;  Laterality: Left;   TONSILLECTOMY     TOTAL HIP ARTHROPLASTY Left 05/08/2011   necrosis of hip   TOTAL HIP ARTHROPLASTY Right 06/14/2015   Procedure: TOTAL HIP ARTHROPLASTY;  Surgeon: Lynwood SHAUNNA Hue, MD;  Location: [facility];  Service: Orthopedics;  Laterality: Right;   TOTAL HIP REVISION Left 02/21/2016   Procedure: TOTAL HIP REVISION;  Surgeon: Lynwood SHAUNNA Hue, MD;  Location: [facility];  Service: Orthopedics;  Laterality: Left;     Social History   Tobacco Use   Smoking status: Never   Smokeless tobacco: Never  Vaping Use   Vaping status: Never Used  Substance Use Topics   Alcohol use: No   Drug use: No      Family History  Problem  Relation Age of Onset   Diabetes Mother    Osteoarthritis Mother    Diabetes Maternal Grandfather    Hypertension Maternal Grandfather    Ovarian cancer Paternal Aunt    Cancer - Lung Paternal Uncle      Allergies  Allergen Reactions   Penicillins Hives and Other (See Comments)    Has patient had a PCN reaction causing immediate rash, facial/tongue/throat swelling, SOB or lightheadedness with hypotension: No Has patient had a PCN reaction causing severe rash involving mucus membranes or skin necrosis: No Has patient had a PCN reaction that required hospitalization No Has patient had a PCN reaction occurring within the last 10 years: No If all of the above answers are NO, then may proceed with Cephalosporin use. Other reaction(s): Unknown   Erythromycin Rash   Other Nausea Only    Mycins   Tetracyclines & Related Rash     REVIEW OF SYSTEMS (Negative unless checked)   Constitutional: [] Weight loss  [] Fever  [] Chills Cardiac: [] Chest pain   [] Chest pressure   [] Palpitations   [] Shortness of breath when laying flat   [] Shortness of breath at rest   [] Shortness of breath with exertion. Vascular:  [x] Pain in legs with walking   [] Pain in legs at rest   [] Pain in legs when laying flat   [] Claudication   [] Pain in feet when walking  [] Pain in feet at rest  [] Pain in feet when laying flat   [] History of DVT   [] Phlebitis   [x] Swelling in legs   [] Varicose veins   [] Non-healing ulcers Pulmonary:   [] Uses home oxygen   [] Productive cough   [] Hemoptysis   [] Wheeze  [] COPD   [] Asthma Neurologic:  [] Dizziness  [] Blackouts   [] Seizures   [] History of stroke   [] History of TIA  [] Aphasia   [] Temporary blindness   [] Dysphagia   [] Weakness or numbness in arms   [] Weakness or numbness in legs Musculoskeletal:  [x] Arthritis   [] Joint swelling   [x] Joint pain   [] Low back pain Hematologic:  [] Easy bruising  [] Easy bleeding   [] Hypercoagulable state   [] Anemic  [] Hepatitis Gastrointestinal:  [] Blood  in stool   [] Vomiting blood  [x] Gastroesophageal reflux/heartburn   [] Abdominal pain Genitourinary:  [] Chronic kidney disease   [] Difficult urination  [] Frequent urination  [] Burning with urination   [] Hematuria Skin:  [] Rashes   [] Ulcers   [] Wounds Psychological:  [] History of anxiety   []  History of major depression.  Physical Examination  BP 134/82   Pulse 72   Ht 5' 1.5 (1.562 m)   Wt 200 lb 2 oz (  90.8 kg)   LMP 02/08/2005 (Approximate)   BMI 37.20 kg/m  Gen:  WD/WN, NAD. Appears younger than stated age. Head: [place]/AT, No temporalis wasting. Ear/Nose/Throat: Hearing grossly intact, nares w/o erythema or drainage Eyes: Conjunctiva clear. Sclera non-icteric Neck: Supple.  Trachea midline Pulmonary:  Good air movement, no use of accessory muscles.  Cardiac: RRR, no JVD Vascular:  Vessel Right Left  Radial Palpable Palpable           Musculoskeletal: M/S 5/5 throughout.  No deformity or atrophy. 2-3+ BLE edema. Neurologic: Sensation grossly intact in extremities.  Symmetrical.  Speech is fluent.  Psychiatric: Judgment intact, Mood & affect appropriate for pt's clinical situation. Dermatologic: No rashes or ulcers noted.  No cellulitis or open wounds.      Labs Recent Results (from the past 2160 hours)  HM DEXA SCAN     Status: None   Collection Time: 11/11/23  7:23 AM  Result Value Ref Range   HM Dexa Scan Osteoporosis     Comment: abst by him    Radiology No results found.  Assessment/Plan  Lymphedema Patient has quite severe lymphedema and has had multiple previous foot surgeries.  It sounds like she might need another foot surgery with prolonged immobility which will worsen her swelling further.  Thankfully, she does not have any skin threat, cellulitis, or major skin changes.  She will continue to use her lymphedema pump 2-3 times a day.  She will elevate is much as possible.  If she can get some sort of compression garment on that would be helpful.  I will plan  to see her back in 6 months.  She does have an upcoming long trip to [place]  and she is going to try to get out and walk is much as possible and this is going to be quite painful.  I did give her some pain medication for the short-term but will not be doing this long-term.  Benign essential HTN blood pressure control important in reducing the progression of atherosclerotic disease. On appropriate oral medications.     Varicose veins of both legs with edema Venous reflux study was normal so I think most of the edema is coming from lymphedema at this point.  Selinda Gu, MD  12/16/2023 11:46 AM    This note was created with Dragon medical transcription system.  Any errors from dictation are purely unintentional

## 2023-12-17 ENCOUNTER — Encounter: Payer: Self-pay | Admitting: Obstetrics and Gynecology

## 2023-12-17 ENCOUNTER — Ambulatory Visit (INDEPENDENT_AMBULATORY_CARE_PROVIDER_SITE_OTHER): Payer: Medicare (Managed Care) | Admitting: Obstetrics and Gynecology

## 2023-12-17 ENCOUNTER — Other Ambulatory Visit (HOSPITAL_COMMUNITY)
Admission: RE | Admit: 2023-12-17 | Discharge: 2023-12-17 | Disposition: A | Discharge status: Home / Self Care | Payer: Medicare (Managed Care) | Source: Ambulatory Visit | Attending: Obstetrics and Gynecology | Admitting: Obstetrics and Gynecology

## 2023-12-17 VITALS — BP 116/72 | HR 69 | Ht 62.5 in | Wt 200.0 lb

## 2023-12-17 DIAGNOSIS — Z1151 Encounter for screening for human papillomavirus (HPV): Secondary | ICD-10-CM | POA: Diagnosis not present

## 2023-12-17 DIAGNOSIS — M810 Age-related osteoporosis without current pathological fracture: Secondary | ICD-10-CM | POA: Diagnosis not present

## 2023-12-17 DIAGNOSIS — Z124 Encounter for screening for malignant neoplasm of cervix: Secondary | ICD-10-CM | POA: Insufficient documentation

## 2023-12-17 DIAGNOSIS — Z01419 Encounter for gynecological examination (general) (routine) without abnormal findings: Secondary | ICD-10-CM | POA: Diagnosis present

## 2023-12-17 NOTE — Patient Instructions (Addendum)
 Denosumab Injection (Osteoporosis) What is this medication? DENOSUMAB (den oh SUE mab) prevents and treats osteoporosis. It works by [employer] spatial [employer] stronger and less likely to break (fracture). It is a monoclonal antibody. This medicine may be used for other purposes; ask your health care provider or pharmacist if you have questions. COMMON BRAND NAME(S): Prolia What should I tell my care team before I take this medication? They need to know if you have any of these conditions: Dental or gum disease Had thyroid or parathyroid (glands located in neck) surgery Having dental surgery or a tooth pulled Kidney disease Low levels of calcium in the blood On dialysis Poor nutrition Thyroid disease Trouble absorbing nutrients from your food An unusual or allergic reaction to denosumab, other medications, foods, dyes, or preservatives Pregnant or trying to get pregnant Breastfeeding How should I use this medication? This medication is injected under the skin. It is given by your care team in a hospital or clinic setting. A special MedGuide will be given to you before each treatment. Be sure to read this information carefully each time. Talk to your care team about the use of this medication in children. Special care may be needed. Overdosage: If you think you have taken too much of this medicine contact a poison control center or emergency room at once. NOTE: This medicine is only for you. Do not share this medicine with others. What if I miss a dose? Keep appointments for follow-up doses. It is important not to miss your dose. Call your care team if you are unable to keep an appointment. What may interact with this medication? Do not take this medication with any of the following: Other medications that contain denosumab This medication may also interact with the following: Medications that lower your chance of fighting infection Steroid medications, such as prednisone or cortisone This  list may not describe all possible interactions. Give your health care provider a list of all the medicines, herbs, non-prescription drugs, or dietary supplements you use. Also tell them if you smoke, drink alcohol, or use illegal drugs. Some items may interact with your medicine. What should I watch for while using this medication? Your condition will be monitored carefully while you are receiving this medication. You may need blood work done while taking this medication. This medication may increase your risk of getting an infection. Call your care team for advice if you get a fever, chills, sore throat, or other symptoms of a cold or flu. Do not treat yourself. Try to avoid being around people who are sick. Tell your dentist and dental surgeon that you are taking this medication. You should not have major dental surgery while on this medication. See your dentist to have a dental exam and fix any dental problems before starting this medication. Take good care of your teeth while on this medication. Make sure you see your dentist for regular follow-up appointments. This medication may cause low levels of calcium in your body. The risk of severe side effects is increased in people with kidney disease. Your care team may prescribe calcium and vitamin D  to help prevent low calcium levels while you take this medication. It is important to take calcium and vitamin D  as directed by your care team. Talk to your care team if you may be pregnant. Serious birth defects may occur if you take this medication during pregnancy and for 5 months after the last dose. You will need a negative pregnancy test before starting this medication. Contraception  is recommended while taking this medication and for 5 months after the last dose. Your care team can help you find the option that works for you. Talk to your care team before breastfeeding. Changes to your treatment plan may be needed. What side effects may I notice from  receiving this medication? Side effects that you should report to your care team as soon as possible: Allergic reactions--skin rash, itching, hives, swelling of the face, lips, tongue, or throat Infection--fever, chills, cough, sore throat, wounds that don't heal, pain or trouble when passing urine, general feeling of discomfort or being unwell Low calcium level--muscle pain or cramps, confusion, tingling, or numbness in the hands or feet Osteonecrosis of the jaw--pain, swelling, or redness in the mouth, numbness of the jaw, poor healing after dental work, unusual discharge from the mouth, visible bones in the mouth Severe bone, joint, or muscle pain Skin infection--skin redness, swelling, warmth, or pain Side effects that usually do not require medical attention (report these to your care team if they continue or are bothersome): Back pain Headache Joint pain Muscle pain Pain in the hands, arms, legs, or feet Runny or stuffy nose Sore throat This list may not describe all possible side effects. Call your doctor for medical advice about side effects. You may report side effects to FDA at 1-800-FDA-1088. Where should I keep my medication? This medication is given in a hospital or clinic. It will not be stored at home. NOTE: This sheet is a summary. It may not cover all possible information. If you have questions about this medicine, talk to your doctor, pharmacist, or health care provider.  2024 [employer]/Gold Standard (2022-07-02 00:00:00)  Zoledronic Acid Injection (Bone Disorders) What is this medication? ZOLEDRONIC ACID (ZOE le dron ik AS id) prevents and treats osteoporosis. It may also be used to treat Paget's disease of the bone. It works by [employer] spatial designer stronger and less likely to break (fracture). It belongs to a group of medications called bisphosphonates. This medicine may be used for other purposes; ask your health care provider or pharmacist if you have questions. COMMON  BRAND NAME(S): Reclast What should I tell my care team before I take this medication? They need to know if you have any of these conditions: Bleeding disorder Cancer Dental disease Kidney disease Low levels of calcium in the blood Low red blood cell counts Lung or breathing disease, such as asthma Receiving steroids, such as dexamethasone or prednisone An unusual or allergic reaction to zoledronic acid, other medications, foods, dyes, or preservatives Pregnant or trying to get pregnant Breast-feeding How should I use this medication? This medication is injected into a vein. It is given by your care team in a hospital or clinic setting. A special MedGuide will be given to you before each treatment. Be sure to read this information carefully each time. Talk to your care team about the use of this medication in children. Special care may be needed. Overdosage: If you think you have taken too much of this medicine contact a poison control center or emergency room at once. NOTE: This medicine is only for you. Do not share this medicine with others. What if I miss a dose? Keep appointments for follow-up doses. It is important not to miss your dose. Call your care team if you are unable to keep an appointment. What may interact with this medication? Certain antibiotics given by injection Medications for pain and inflammation, such as ibuprofen, naproxen, NSAIDs Some diuretics, such as bumetanide, furosemide  Teriparatide This list may not describe all possible interactions. Give your health care provider a list of all the medicines, herbs, non-prescription drugs, or dietary supplements you use. Also tell them if you smoke, drink alcohol, or use illegal drugs. Some items may interact with your medicine. What should I watch for while using this medication? Visit your care team for regular checks on your progress. It may be some time before you see the benefit from this medication. Some people who  take this medication have severe bone, joint, or muscle pain. This medication may also increase your risk for jaw problems or a broken thigh bone. Tell your care team right away if you have severe pain in your jaw, bones, joints, or muscles. Tell your care team if you have any pain that does not go away or that gets worse. You should make sure you get enough calcium and vitamin D  while you are taking this medication. Discuss the foods you eat and the vitamins you take with your care team. You may need bloodwork while taking this medication. Tell your dentist and dental surgeon that you are taking this medication. You should not have major dental surgery while on this medication. See your dentist to have a dental exam and fix any dental problems before starting this medication. Take good care of your teeth while on this medication. Make sure you see your dentist for regular follow-up appointments. What side effects may I notice from receiving this medication? Side effects that you should report to your care team as soon as possible: Allergic reactions--skin rash, itching, hives, swelling of the face, lips, tongue, or throat Kidney injury--decrease in the amount of urine, swelling of the ankles, hands, or feet Low calcium level--muscle pain or cramps, confusion, tingling, or numbness in the hands or feet Osteonecrosis of the jaw--pain, swelling, or redness in the mouth, numbness of the jaw, poor healing after dental work, unusual discharge from the mouth, visible bones in the mouth Severe bone, joint, or muscle pain Side effects that usually do not require medical attention (report to your care team if they continue or are bothersome): Diarrhea Dizziness Headache Nausea Stomach pain Vomiting This list may not describe all possible side effects. Call your doctor for medical advice about side effects. You may report side effects to FDA at 1-800-FDA-1088. Where should I keep my medication? This  medication is given in a hospital or clinic. It will not be stored at home. NOTE: This sheet is a summary. It may not cover all possible information. If you have questions about this medicine, talk to your doctor, pharmacist, or health care provider.  2024 [employer]/Gold Standard (2021-07-13 00:00:00)  EXERCISE AND DIET:  We recommended that you start or continue a regular exercise program for good health. Regular exercise means any activity that makes your heart beat faster and makes you sweat.  We recommend exercising at least 30 minutes per day at least 3 days a week, preferably 4 or 5.  We also recommend a diet low in fat and sugar.  Inactivity, poor dietary choices and obesity can cause diabetes, heart attack, stroke, and kidney damage, among others.    ALCOHOL AND SMOKING:  Women should limit their alcohol intake to no more than 7 drinks/beers/glasses of wine (combined, not each!) per week. Moderation of alcohol intake to this level decreases your risk of breast cancer and liver damage. And of course, no recreational drugs are part of a healthy lifestyle.  And absolutely no smoking or even  second hand smoke. Most people know smoking can cause heart and lung diseases, but did you know it also contributes to weakening of your bones? Aging of your skin?  Yellowing of your teeth and nails?  CALCIUM AND VITAMIN D :  Adequate intake of calcium and Vitamin D  are recommended.  The recommendations for exact amounts of these supplements seem to change often, but generally speaking 600 mg of calcium (either carbonate or citrate) and 800 units of Vitamin D  per day seems prudent. Certain women may benefit from higher intake of Vitamin D .  If you are among these women, your doctor will have told you during your visit.    PAP SMEARS:  Pap smears, to check for cervical cancer or precancers,  have traditionally been done yearly, although recent scientific advances have shown that most women can have pap smears less  often.  However, every woman still should have a physical exam from her gynecologist every year. It will include a breast check, inspection of the vulva and vagina to check for abnormal growths or skin changes, a visual exam of the cervix, and then an exam to evaluate the size and shape of the uterus and ovaries.  And after 77 years of age, a rectal exam is indicated to check for rectal cancers. We will also provide age appropriate advice regarding health maintenance, like when you should have certain vaccines, screening for sexually transmitted diseases, bone density testing, colonoscopy, mammograms, etc.   MAMMOGRAMS:  All women over 35 years old should have a yearly mammogram. Many facilities now offer a "3D" mammogram, which may cost around $50 extra out of pocket. If possible,  we recommend you accept the option to have the 3D mammogram performed.  It both reduces the number of women who will be called back for extra views which then turn out to be normal, and it is better than the routine mammogram at detecting truly abnormal areas.    COLONOSCOPY:  Colonoscopy to screen for colon cancer is recommended for all women at age 25.  We know, you hate the idea of the prep.  We agree, BUT, having colon cancer and not knowing it is worse!!  Colon cancer so often starts as a polyp that can be seen and removed at colonscopy, which can quite literally save your life!  And if your first colonoscopy is normal and you have no family history of colon cancer, most women don't have to have it again for 10 years.  Once every ten years, you can do something that may end up saving your life, right?  We will be happy to help you get it scheduled when you are ready.  Be sure to check your insurance coverage so you understand how much it will cost.  It may be covered as a preventative service at no cost, but you should check your particular policy.

## 2023-12-17 NOTE — Progress Notes (Signed)
 78 y.o. G60P2002 Married Caucasian female here for a breast and pelvic exam.    The patient is also followed for osteoporosis of left wrist, T score -2.5, osteopenia of right wrist -2.4. Hx stress fracture of left foot about 18 years ago.  Taking precautions to avoid falls at home.   No upcoming dental surgeries.  Normal renal function.   She will see her PCP in near future.    Patient is dealing with lymphedema.  Difficulty with ambulation.  Uses a cane.  Seeing specialist.  May start water therapy.    Traveling to [place]  for family deaths. Married for 56 years.   PCP: Diedra Lame, MD   Patient's last menstrual period was 02/08/2005 (approximate).           Sexually active: Yes.    The current method of family planning is post menopausal status.    Menopausal hormone therapy:  n/a Exercising: Yes.    At home leg workouts Smoker:  no  OB History     Gravida  2   Para  2   Term  2   Preterm  0   AB  0   Living  2      SAB  0   IAB  0   Ectopic  0   Multiple  0   Live Births  2           HEALTH MAINTENANCE: Last 2 paps: 04/19/21 neg, 03/04/18 neg  History of abnormal Pap or positive HPV:  no Mammogram:  11/05/23 Dx right mammogram - Breast Density Cat A, BIRADS Cat 1 neg  Colonoscopy:   07/21/23  Bone Density:  11/11/23  Result  osteoporosis    Immunization History  Administered Date(s) Administered   Influenza, High Dose Seasonal PF 02/22/2019   Influenza-Unspecified 02/21/2015   PFIZER(Purple Top)SARS-COV-2 Vaccination 07/12/2019, 08/09/2019   Pneumococcal Conjugate-13 02/21/2015   Pneumococcal Polysaccharide-23 04/01/2017   Td 03/10/1996   Tdap 06/10/2004, 12/28/2013   Zoster, Live 02/21/2015      reports that she has never smoked. She has never used smokeless tobacco. She reports that she does not drink alcohol and does not use drugs.  Past Medical History:  Diagnosis Date   Arthritis    Complication of anesthesia    unable  to do spinals due to birth defect    GERD (gastroesophageal reflux disease) 01/2000   Dr. Geryl   History of 2019 novel coronavirus disease (COVID-19) 01/15/2021   Hypertension    Low back pain 04/1994   Lymphedema    Menopause    Osteoarthritis    Osteoporosis 2025   left radius   Restless leg syndrome 1999    Past Surgical History:  Procedure Laterality Date   ACHILLES TENDON SURGERY Right 11/16/2021   Procedure: PTAL - ACHILLES TENDON MICROTENOTOMY;  Surgeon: Lennie Barter, DPM;  Location: [facility];  Service: Podiatry;  Laterality: Right;   ARTHRODESIS METATARSAL Right 11/16/2021   Procedure: ARTHRODESIS METATARSAL, T-N, C-C OR MET-CUNNEIFORM, NCJ;  Surgeon: Lennie Barter, DPM;  Location: [facility];  Service: Podiatry;  Laterality: Right;   CATARACT EXTRACTION     CATARACT EXTRACTION W/PHACO Left 08/15/2020   Procedure: CATARACT EXTRACTION PHACO AND INTRAOCULAR LENS PLACEMENT (IOC) LEFT 6.60 00:39.8;  Surgeon: Jaye Fallow, MD;  Location: [facility];  Service: Ophthalmology;  Laterality: Left;   CATARACT EXTRACTION W/PHACO Right 08/29/2020   Procedure: CATARACT EXTRACTION PHACO AND INTRAOCULAR LENS PLACEMENT (IOC) RIGHT 6.94 00:42.3;  Surgeon: Jaye Fallow,  MD;  Location: [facility];  Service: Ophthalmology;  Laterality: Right;   CESAREAN SECTION  1972, 1975   X2   COLONOSCOPY  12/06/2011   normal   COLONOSCOPY WITH PROPOFOL  N/A 04/13/2018   Procedure: COLONOSCOPY WITH PROPOFOL ;  Surgeon: Viktoria Lamar DASEN, MD;  Location: [facility];  Service: Endoscopy;  Laterality: N/A;   COLONOSCOPY WITH PROPOFOL  N/A 07/21/2023   Procedure: COLONOSCOPY WITH PROPOFOL ;  Surgeon: Onita Elspeth Sharper, DO;  Location: [facility];  Service: Gastroenterology;  Laterality: N/A;   ESOPHAGOGASTRODUODENOSCOPY (EGD) WITH PROPOFOL  N/A 02/23/2015   Procedure: ESOPHAGOGASTRODUODENOSCOPY (EGD) WITH PROPOFOL ;  Surgeon: Lamar DASEN Viktoria, MD;  Location: [facility];   Service: Endoscopy;  Laterality: N/A;   ESOPHAGOGASTRODUODENOSCOPY (EGD) WITH PROPOFOL  N/A 04/13/2018   Procedure: ESOPHAGOGASTRODUODENOSCOPY (EGD) WITH PROPOFOL ;  Surgeon: Viktoria Lamar DASEN, MD;  Location: [facility];  Service: Endoscopy;  Laterality: N/A;   ESOPHAGOGASTRODUODENOSCOPY (EGD) WITH PROPOFOL  N/A 07/21/2023   Procedure: ESOPHAGOGASTRODUODENOSCOPY (EGD) WITH PROPOFOL ;  Surgeon: Onita Elspeth Sharper, DO;  Location: [facility];  Service: Gastroenterology;  Laterality: N/A;   FOOT ARTHRODESIS Right 11/16/2021   Procedure: ARTHRODESIS FOOT; TRIPLE;  Surgeon: Lennie Barter, DPM;  Location: [facility];  Service: Podiatry;  Laterality: Right;   FOOT SURGERY     with screws   HARDWARE REMOVAL Right 11/16/2021   Procedure: HARDWARE REMOVAL;  Surgeon: Lennie Barter, DPM;  Location: [facility];  Service: Podiatry;  Laterality: Right;   HIP CLOSED REDUCTION Left 08/08/2015   Procedure: CLOSED REDUCTION HIP;  Surgeon: Lynwood SHAUNNA Hue, MD;  Location: [facility];  Service: Orthopedics;  Laterality: Left;   HIP CLOSED REDUCTION Left 09/18/2015   Procedure: CLOSED REDUCTION HIP;  Surgeon: Sharper Flake, MD;  Location: [facility];  Service: Orthopedics;  Laterality: Left;   HIP CLOSED REDUCTION Left 12/01/2015   Procedure: CLOSED REDUCTION HIP;  Surgeon: Lynwood SHAUNNA Hue, MD;  Location: [facility];  Service: Orthopedics;  Laterality: Left;  no prep no incision   JOINT REPLACEMENT  10/2016   left knee   KNEE ARTHROPLASTY Left 10/28/2016   Procedure: COMPUTER ASSISTED TOTAL KNEE ARTHROPLASTY;  Surgeon: Hue Lynwood SHAUNNA, MD;  Location: [facility];  Service: Orthopedics;  Laterality: Left;   KNEE ARTHROPLASTY Right 07/07/2017   Procedure: COMPUTER ASSISTED TOTAL KNEE ARTHROPLASTY;  Surgeon: Hue Lynwood SHAUNNA, MD;  Location: [facility];  Service: Orthopedics;  Laterality: Right;   KNEE ARTHROSCOPY Right 11/14/2014   Procedure: ARTHROSCOPY KNEE;  Surgeon: Lynwood SHAUNNA Hue, MD;  Location: [facility];  Service:  Orthopedics;  Laterality: Right;  Posterior horn menicus, anterior lateral menicus grade 3 chondromalacia   MALONEY DILATION  07/21/2023   Procedure: MALONEY DILATION;  Surgeon: Onita Elspeth Sharper, DO;  Location: [facility] END[facility]COPY;  Service: Gastroenterology;;   phlebitis groin     POLYPECTOMY  07/21/2023   Procedure: POLYPECTOMY;  Surgeon: Onita Elspeth Sharper, DO;  Location: [facility] END[facility];  Service: Gastroenterology;;   right foot surgery     11/2021   SHOULDER ARTHROSCOPY WITH OPEN ROTATOR CUFF REPAIR Left 03/10/2019   Procedure: LEFT SHOULDER ARTHROSCOPY, ARTHROSCOPIC ROTATOR CUFF REPAIR, DISTAL CLAVICLE EXCISION, SUBACROMIAL DECOMPRESSION, INTRA-ARTICULAR DEBRIDEMENT ;  Surgeon: Leora Lynwood SAUNDERS, MD;  Location: [facility];  Service: Orthopedics;  Laterality: Left;   SHOULDER ARTHROSCOPY WITH ROTATOR CUFF REPAIR Left 06/21/2019   Procedure: SHOULDER ARTHROSCOPY WITH EXTENSIVE INTRAARTICULAR DEBRIDEMENT WITH REMOVAL OF HARDWARE;  Surgeon: Leora Lynwood SAUNDERS, MD;  Location: [facility];  Service: Orthopedics;  Laterality: Left;   TONSILLECTOMY     TOTAL HIP  ARTHROPLASTY Left 05/08/2011   necrosis of hip   TOTAL HIP ARTHROPLASTY Right 06/14/2015   Procedure: TOTAL HIP ARTHROPLASTY;  Surgeon: Lynwood SHAUNNA Hue, MD;  Location: [facility];  Service: Orthopedics;  Laterality: Right;   TOTAL HIP REVISION Left 02/21/2016   Procedure: TOTAL HIP REVISION;  Surgeon: Lynwood SHAUNNA Hue, MD;  Location: [facility];  Service: Orthopedics;  Laterality: Left;    Current Outpatient Medications  Medication Sig Dispense Refill   acetaminophen  (TYLENOL ) 500 MG tablet Take 1,000 mg by mouth every 6 (six) hours as needed.     atenolol  (TENORMIN ) 50 MG tablet daily.     betamethasone  dipropionate 0.05 % cream Apply to affected areas itchy bumps twice daily until improved. Avoid applying to face, groin, and axilla. Use as directed. Long-term use can cause thinning of the skin. 45 g 0   Biotin 5000 MCG TABS Take 10,000 mcg  by mouth daily.     Calcium  Carbonate-Vitamin D  (CALTRATE 600+D PO) Take by mouth 2 (two) times daily.     clindamycin  (CLEOCIN ) 300 MG capsule Take 300 mg by mouth daily. For dental     Coenzyme Q10 (CO Q-10) 200 MG CAPS Take 100 mg by mouth daily.     Glucosamine HCl-MSM 1500-500 MG/30ML LIQD Take by mouth 2 (two) times daily.     hydrOXYzine  (ATARAX ) 10 MG tablet Take 1-2 tablets by mouth at night as needed for itching. May make drowsy. 60 tablet 0   Misc. Devices (COMMODE BEDSIDE) MISC 1 Device by Does not apply route as needed. 3:1 BSC 1 each 0   omeprazole (PRILOSEC) 20 MG capsule Take by mouth.     pramipexole  (MIRAPEX ) 1.5 MG tablet Take 1.5 mg by mouth at bedtime.      Red Yeast Rice 600 MG TABS Take 600 mg by mouth 2 (two) times daily.     traZODone  (DESYREL ) 50 MG tablet trazodone  50 mg tablet     No current facility-administered medications for this visit.    ALLERGIES: Penicillins, Erythromycin, Other, and Tetracyclines & related  Family History  Problem Relation Age of Onset   Diabetes Mother    Osteoarthritis Mother    Diabetes Maternal Grandfather    Hypertension Maternal Grandfather    Ovarian cancer Paternal Aunt    Cancer - Lung Paternal Uncle     Review of Systems  All other systems reviewed and are negative.   PHYSICAL EXAM:  BP 116/72 (BP Location: Left Arm, Patient Position: Sitting)   Pulse 69   Ht 5' 2.5 (1.588 m)   Wt 200 lb (90.7 kg)   LMP 02/08/2005 (Approximate)   SpO2 95%   BMI 36.00 kg/m     General appearance: alert, cooperative and appears stated age Head: normocephalic, without obvious abnormality, atraumatic Neck: no adenopathy, supple, symmetrical, trachea midline and thyroid normal to inspection and palpation Lungs: clear to auscultation bilaterally Breasts: normal appearance, no masses or tenderness, No nipple retraction or dimpling, No nipple discharge or bleeding, No axillary adenopathy Heart: regular rate and rhythm Abdomen:  soft, non-tender; no masses, no organomegaly Extremities: extremities normal, atraumatic, no cyanosis or edema Skin: skin color, texture, turgor normal. No rashes or lesions Lymph nodes: cervical, supraclavicular, and axillary nodes normal. Neurologic: grossly normal  Pelvic: External genitalia:  no lesions              No abnormal inguinal nodes palpated.              Urethra:  normal appearing  urethra with no masses, tenderness or lesions              Bartholins and Skenes: normal                 Vagina: normal appearing vagina with normal color and discharge, no lesions              Cervix: no lesions              Pap taken: yes Bimanual Exam:  Uterus:  normal size, contour, position, consistency, mobility, non-tender              Adnexa: no mass, fullness, tenderness              Rectal exam: yes.  Confirms.              Anus:  normal sphincter tone, no lesions  Chaperone was present for exam:  Kari HERO, CMA  ASSESSMENT: Encounter for breast and pelvic exam.  Osteoporosis of wrist.  GERD.   On Prilosec.  Lymphedema.   PLAN: Mammogram screening discussed. Self breast awareness reviewed. Pap and HRV collected:  yes Guidelines for Calcium , Vitamin D , regular exercise program including cardiovascular and weight bearing exercise. Medication refills:  NA TSH, PTH, calcium , Ph, vit D. We discussed osteoporosis and risk of fractures.  Tx options reviewed:  Reclast, Prolia.  Risks and benefits discussed.  Written information also provided.  Patient will study her options.  Next BMD in 2 years.  Follow up:  1 year and prn.     Additional counseling given.  yes. 35 min  total time was spent for this patient encounter, including preparation, face-to-face counseling with the patient, coordination of care, and documentation of the encounter regarding osteoporosis in addition to doing the breast and pelvic exam.

## 2023-12-18 ENCOUNTER — Ambulatory Visit: Payer: Self-pay | Admitting: Obstetrics and Gynecology

## 2023-12-18 LAB — TSH: TSH: 1.15 m[IU]/L (ref 0.40–4.50)

## 2023-12-18 LAB — PHOSPHORUS: Phosphorus: 4 mg/dL (ref 2.1–4.3)

## 2023-12-18 LAB — VITAMIN D 25 HYDROXY (VIT D DEFICIENCY, FRACTURES): Vit D, 25-Hydroxy: 95 ng/mL (ref 30–100)

## 2023-12-19 LAB — CYTOLOGY - PAP
Comment: NEGATIVE
Diagnosis: NEGATIVE
High risk HPV: NEGATIVE

## 2023-12-19 LAB — PTH, INTACT (ICMA) AND IONIZED CALCIUM
Calcium, Ion: 5.2 mg/dL (ref 4.7–5.5)
Calcium: 9.6 mg/dL (ref 8.6–10.4)
PTH: 21 pg/mL (ref 16–77)

## 2024-02-12 ENCOUNTER — Telehealth (INDEPENDENT_AMBULATORY_CARE_PROVIDER_SITE_OTHER): Payer: Self-pay

## 2024-02-12 NOTE — Telephone Encounter (Signed)
 Patient left a message requesting for pain medication to help with pain from the heaviness with lymphedema especially at night. Patient is requesting for the pain medication (Oxycodone ) that was sent when she was last seen back on 12/16/23. I did recommend the patient to contact her PCP. Please Advise

## 2024-02-12 NOTE — Telephone Encounter (Signed)
Left a detailed message on the patient voicemail.

## 2024-02-26 ENCOUNTER — Encounter: Payer: Self-pay | Admitting: Obstetrics and Gynecology

## 2024-02-26 ENCOUNTER — Telehealth: Payer: Self-pay

## 2024-02-26 NOTE — Telephone Encounter (Signed)
 Patient called and stated she has some question for Dr. Nikki.  I called the patient to inquire about the questions. Left message for patient to return call to triage.

## 2024-06-18 ENCOUNTER — Encounter (INDEPENDENT_AMBULATORY_CARE_PROVIDER_SITE_OTHER): Payer: Self-pay | Admitting: Vascular Surgery

## 2024-06-18 ENCOUNTER — Ambulatory Visit (INDEPENDENT_AMBULATORY_CARE_PROVIDER_SITE_OTHER): Payer: Medicare (Managed Care) | Admitting: Vascular Surgery

## 2024-06-18 VITALS — BP 126/78 | HR 65 | Ht 62.5 in | Wt 209.0 lb

## 2024-06-18 DIAGNOSIS — I10 Essential (primary) hypertension: Secondary | ICD-10-CM | POA: Diagnosis not present

## 2024-06-18 DIAGNOSIS — I83893 Varicose veins of bilateral lower extremities with other complications: Secondary | ICD-10-CM | POA: Diagnosis not present

## 2024-06-18 DIAGNOSIS — I890 Lymphedema, not elsewhere classified: Secondary | ICD-10-CM | POA: Diagnosis not present

## 2024-06-18 MED ORDER — PREGABALIN 75 MG PO CAPS: 75.0000 mg | ORAL_CAPSULE | Freq: Every day | ORAL | 5 refills | Status: AC

## 2024-06-18 NOTE — Progress Notes (Signed)
 "   MRN : [id number]  Claudia Terry is a 79 y.o. (1945-12-15) female who presents with chief complaint of  Chief Complaint  Patient presents with   Follow-up    6 months no studies  Fluid remaining still per patient ** and pt would like pain medication   .  History of Present Illness:   Discussed the use of AI scribe software for clinical note transcription with the patient, who gave verbal consent to proceed.  History of Present Illness Claudia Terry is a 79 year old female with chronic lymphedema who presents for follow-up of worsening lower extremity swelling and pain.  Over the past 18 months she has had progressive bilateral lower extremity lymphedema with increased swelling, weight gain, and impaired mobility that makes ambulation difficult.  She uses compression hose but application is increasingly difficult due to arthritis and limited mobility. She tries to wear them daily and has limited assistance at home. She continues pneumatic compression pump use twice daily.  She participates in water aerobics twice weekly, chair yoga afterward, and walks as tolerated. She receives manual lymphatic drainage from a lymphedema-focused massage therapist but benefit has been limited. She stopped over-the-counter lymphedema medications due to lack of effect.  She has severe nocturnal leg pain described as hammering, which previously woke her hourly. Gabapentin  300 mg has improved sleep and pain but she still has significant nighttime discomfort and requests additional pain control.  She denies fever, erythema, or signs of acute infection.    Results   Current Outpatient Medications  Medication Sig Dispense Refill   acetaminophen  (TYLENOL ) 500 MG tablet Take 1,000 mg by mouth every 6 (six) hours as needed.     atenolol  (TENORMIN ) 50 MG tablet daily.     betamethasone  dipropionate 0.05 % cream Apply to affected areas itchy bumps twice daily until improved. Avoid applying to face,  groin, and axilla. Use as directed. Long-term use can cause thinning of the skin. 45 g 0   Biotin 5000 MCG TABS Take 10,000 mcg by mouth daily.     Calcium  Carbonate-Vitamin D  (CALTRATE 600+D PO) Take by mouth 2 (two) times daily.     clindamycin  (CLEOCIN ) 300 MG capsule Take 300 mg by mouth daily. For dental     Coenzyme Q10 (CO Q-10) 200 MG CAPS Take 100 mg by mouth daily.     Glucosamine HCl-MSM 1500-500 MG/30ML LIQD Take by mouth 2 (two) times daily.     Misc. Devices (COMMODE BEDSIDE) MISC 1 Device by Does not apply route as needed. 3:1 BSC 1 each 0   omeprazole (PRILOSEC) 20 MG capsule Take by mouth.     pramipexole  (MIRAPEX ) 1.5 MG tablet Take 1.5 mg by mouth at bedtime.      Red Yeast Rice 600 MG TABS Take 600 mg by mouth 2 (two) times daily.     traZODone  (DESYREL ) 50 MG tablet trazodone  50 mg tablet     gabapentin  (NEURONTIN ) 300 MG capsule Take 300 mg by mouth daily.     hydrOXYzine  (ATARAX ) 10 MG tablet Take 1-2 tablets by mouth at night as needed for itching. May make drowsy. (Patient not taking: Reported on 06/18/2024) 60 tablet 0   No current facility-administered medications for this visit.    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 Geryl  History of 2019 novel coronavirus disease (COVID-19) 01/15/2021   Hypertension    Low back pain 04/1994   Lymphedema    Menopause    Osteoarthritis    Osteoporosis 2025   left radius   Restless leg syndrome 1999    Past Surgical History:  Procedure Laterality Date   ACHILLES TENDON SURGERY Right 11/16/2021   Procedure: PTAL - ACHILLES TENDON MICROTENOTOMY;  Surgeon: Lennie Barter, DPM;  Location: [facility];  Service: Podiatry;  Laterality: Right;   ARTHRODESIS METATARSAL Right 11/16/2021   Procedure: ARTHRODESIS METATARSAL, T-N, C-C OR MET-CUNNEIFORM, NCJ;  Surgeon: Lennie Barter, DPM;   Location: [facility];  Service: Podiatry;  Laterality: Right;   CATARACT EXTRACTION     CATARACT EXTRACTION W/PHACO Left 08/15/2020   Procedure: CATARACT EXTRACTION PHACO AND INTRAOCULAR LENS PLACEMENT (IOC) LEFT 6.60 00:39.8;  Surgeon: Jaye Fallow, MD;  Location: [facility];  Service: Ophthalmology;  Laterality: Left;   CATARACT EXTRACTION W/PHACO Right 08/29/2020   Procedure: CATARACT EXTRACTION PHACO AND INTRAOCULAR LENS PLACEMENT (IOC) RIGHT 6.94 00:42.3;  Surgeon: Jaye Fallow, MD;  Location: [facility];  Service: Ophthalmology;  Laterality: Right;   CESAREAN SECTION  1972, 1975   X2   COLONOSCOPY  12/06/2011   normal   COLONOSCOPY WITH PROPOFOL  N/A 04/13/2018   Procedure: COLONOSCOPY WITH PROPOFOL ;  Surgeon: Viktoria Lamar DASEN, MD;  Location: [facility];  Service: Endoscopy;  Laterality: N/A;   COLONOSCOPY WITH PROPOFOL  N/A 07/21/2023   Procedure: COLONOSCOPY WITH PROPOFOL ;  Surgeon: Onita Elspeth Sharper, DO;  Location: [facility] END[facility];  Service: Gastroenterology;  Laterality: N/A;   ESOPHAGOGASTRODUODENOSCOPY (EGD) WITH PROPOFOL  N/A 02/23/2015   Procedure: ESOPHAGOGASTRODUODENOSCOPY (EGD) WITH PROPOFOL ;  Surgeon: Lamar DASEN Viktoria, MD;  Location: [facility] ENDOSCOPY;  Service: Endoscopy;  Laterality: N/A;   ESOPHAGOGASTRODUODENOSCOPY (EGD) WITH PROPOFOL  N/A 04/13/2018   Procedure: ESOPHAGOGASTRODUODENOSCOPY (EGD) WITH PROPOFOL ;  Surgeon: Viktoria Lamar DASEN, MD;  Location: [facility] ENDOSCOPY;  Service: Endoscopy;  Laterality: N/A;   ESOPHAGOGASTRODUODENOSCOPY (EGD) WITH PROPOFOL  N/A 07/21/2023   Procedure: ESOPHAGOGASTRODUODENOSCOPY (EGD) WITH PROPOFOL ;  Surgeon: Onita Elspeth Sharper, DO;  Location: [facility] ENDOSCOPY;  Service: Gastroenterology;  Laterality: N/A;   FOOT ARTHRODESIS Right 11/16/2021   Procedure: ARTHRODESIS FOOT; TRIPLE;  Surgeon: Lennie Barter, DPM;  Location: [facility];  Service: Podiatry;  Laterality: Right;   FOOT SURGERY     with screws   HARDWARE  REMOVAL Right 11/16/2021   Procedure: HARDWARE REMOVAL;  Surgeon: Lennie Barter, DPM;  Location: [facility];  Service: Podiatry;  Laterality: Right;   HIP CLOSED REDUCTION Left 08/08/2015   Procedure: CLOSED REDUCTION HIP;  Surgeon: Lynwood SHAUNNA Hue, MD;  Location: [facility];  Service: Orthopedics;  Laterality: Left;   HIP CLOSED REDUCTION Left 09/18/2015   Procedure: CLOSED REDUCTION HIP;  Surgeon: Sharper Flake, MD;  Location: [facility];  Service: Orthopedics;  Laterality: Left;   HIP CLOSED REDUCTION Left 12/01/2015   Procedure: CLOSED REDUCTION HIP;  Surgeon: Lynwood SHAUNNA Hue, MD;  Location: [facility];  Service: Orthopedics;  Laterality: Left;  no prep no incision   JOINT REPLACEMENT  10/2016   left knee   KNEE ARTHROPLASTY Left 10/28/2016   Procedure: COMPUTER ASSISTED TOTAL KNEE ARTHROPLASTY;  Surgeon: Hue Lynwood SHAUNNA, MD;  Location: [facility];  Service: Orthopedics;  Laterality: Left;   KNEE ARTHROPLASTY Right 07/07/2017   Procedure: COMPUTER ASSISTED TOTAL KNEE ARTHROPLASTY;  Surgeon: Hue Lynwood SHAUNNA, MD;  Location: [facility];  Service: Orthopedics;  Laterality: Right;   KNEE ARTHROSCOPY Right 11/14/2014   Procedure: ARTHROSCOPY  KNEE;  Surgeon: Lynwood SHAUNNA Hue, MD;  Location: [facility];  Service: Orthopedics;  Laterality: Right;  Posterior horn menicus, anterior lateral menicus grade 3 chondromalacia   MALONEY DILATION  07/21/2023   Procedure: MALONEY DILATION;  Surgeon: Onita Elspeth Sharper, DO;  Location: [facility] ENDOSCOPY;  Service: Gastroenterology;;   phlebitis groin     POLYPECTOMY  07/21/2023   Procedure: POLYPECTOMY;  Surgeon: Onita Elspeth Sharper, DO;  Location: [facility] ENDOSCOPY;  Service: Gastroenterology;;   right foot surgery     11/2021   SHOULDER ARTHROSCOPY WITH OPEN ROTATOR CUFF REPAIR Left 03/10/2019   Procedure: LEFT SHOULDER ARTHROSCOPY, ARTHROSCOPIC ROTATOR CUFF REPAIR, DISTAL CLAVICLE EXCISION, SUBACROMIAL DECOMPRESSION, INTRA-ARTICULAR DEBRIDEMENT ;  Surgeon: Leora Lynwood SAUNDERS, MD;   Location: [facility];  Service: Orthopedics;  Laterality: Left;   SHOULDER ARTHROSCOPY WITH ROTATOR CUFF REPAIR Left 06/21/2019   Procedure: SHOULDER ARTHROSCOPY WITH EXTENSIVE INTRAARTICULAR DEBRIDEMENT WITH REMOVAL OF HARDWARE;  Surgeon: Leora Lynwood SAUNDERS, MD;  Location: [facility];  Service: Orthopedics;  Laterality: Left;   TONSILLECTOMY     TOTAL HIP ARTHROPLASTY Left 05/08/2011   necrosis of hip   TOTAL HIP ARTHROPLASTY Right 06/14/2015   Procedure: TOTAL HIP ARTHROPLASTY;  Surgeon: Lynwood SHAUNNA Hue, MD;  Location: [facility];  Service: Orthopedics;  Laterality: Right;   TOTAL HIP REVISION Left 02/21/2016   Procedure: TOTAL HIP REVISION;  Surgeon: Lynwood SHAUNNA Hue, MD;  Location: [facility];  Service: Orthopedics;  Laterality: Left;     Social History[1]    Family History  Problem Relation Age of Onset   Diabetes Mother    Osteoarthritis Mother    Diabetes Maternal Grandfather    Hypertension Maternal Grandfather    Ovarian cancer Paternal Aunt    Cancer - Lung Paternal Uncle      Allergies[2]    REVIEW OF SYSTEMS (Negative unless checked)   Constitutional: [] Weight loss  [] Fever  [] Chills Cardiac: [] Chest pain   [] Chest pressure   [] Palpitations   [] Shortness of breath when laying flat   [] Shortness of breath at rest   [] Shortness of breath with exertion. Vascular:  [x] Pain in legs with walking   [] Pain in legs at rest   [] Pain in legs when laying flat   [] Claudication   [] Pain in feet when walking  [] Pain in feet at rest  [] Pain in feet when laying flat   [] History of DVT   [] Phlebitis   [x] Swelling in legs   [] Varicose veins   [] Non-healing ulcers Pulmonary:   [] Uses home oxygen   [] Productive cough   [] Hemoptysis   [] Wheeze  [] COPD   [] Asthma Neurologic:  [] Dizziness  [] Blackouts   [] Seizures   [] History of stroke   [] History of TIA  [] Aphasia   [] Temporary blindness   [] Dysphagia   [] Weakness or numbness in arms   [] Weakness or numbness in legs Musculoskeletal:   [x] Arthritis   [] Joint swelling   [x] Joint pain   [] Low back pain Hematologic:  [] Easy bruising  [] Easy bleeding   [] Hypercoagulable state   [] Anemic  [] Hepatitis Gastrointestinal:  [] Blood in stool   [] Vomiting blood  [x] Gastroesophageal reflux/heartburn   [] Abdominal pain Genitourinary:  [] Chronic kidney disease   [] Difficult urination  [] Frequent urination  [] Burning with urination   [] Hematuria Skin:  [] Rashes   [] Ulcers   [] Wounds Psychological:  [] History of anxiety   []  History of major depression.    Physical Examination  BP 126/78   Pulse 65   Ht 5' 2.5 (1.588 m)   Wt 209 lb (  94.8 kg)   LMP 02/08/2005   BMI 37.62 kg/m  Gen:  WD/WN, NAD Head: [place]ilia/AT, No temporalis wasting. Ear/Nose/Throat: Hearing grossly intact, nares w/o erythema or drainage Eyes: Conjunctiva clear. Sclera non-icteric Neck: Supple.  Trachea midline Pulmonary:  Good air movement, no use of accessory muscles.  Cardiac: RRR, no JVD Vascular:  Vessel Right Left  Radial Palpable Palpable                          PT Not Palpable Not Palpable  DP Not Palpable Not Palpable   Gastrointestinal: soft, non-tender/non-distended. No guarding/reflex.  Musculoskeletal: M/S 5/5 throughout.  No deformity or atrophy. 2+ BLE edema. Neurologic: Sensation grossly intact in extremities.  Symmetrical.  Speech is fluent.  Psychiatric: Judgment intact, Mood & affect appropriate for pt's clinical situation. Dermatologic: No rashes or ulcers noted.  No cellulitis or open wounds.  Physical Exam     Labs No results found for this or any previous visit (from the past 2160 hours).  Radiology No results found.  Assessment/Plan Assessment & Plan Lymphedema Chronic, severe lower extremity lymphedema refractory to conservative management with worsening symptoms. No curative surgical options. Neuropathic pain significant, especially nocturnal. Focus on symptom control. - Referred to hospital-based lymphedema clinic for  occupational therapy and advanced wrapping techniques. - Continued compression hose and pneumatic pump as tolerated. - Encouraged ongoing participation in water aerobics and chair yoga. - Prescribed Lyrica  (pregabalin ) for neuropathic pain in addition to gabapentin . - Provided anticipatory guidance regarding chronicity and symptom management. - Scheduled follow-up in six months. - referral to lymphedema clinic  Varicose veins of bilateral lower extremities with edema Chronic venous insufficiency with varicose veins and edema. Conservative management appropriate due to predominant lymphedema and absence of surgically correctable venous disease. No venous reflux on studies. - Continued compression hose to manage venous edema. - Reinforced importance of donning compression hose before morning ambulation. - No surgical intervention indicated.  Benign essential HTN blood pressure control important in reducing the progression of atherosclerotic disease. On appropriate oral medications.        Selinda Gu, MD  06/18/2024 9:27 AM    This note was created with Dragon medical transcription system.  Any errors from dictation are purely unintentional     [1]  Social History Tobacco Use   Smoking status: Never   Smokeless tobacco: Never  Vaping Use   Vaping status: Never Used  Substance Use Topics   Alcohol use: No   Drug use: No  [2]  Allergies Allergen Reactions   Penicillins Hives and Other (See Comments)    Has patient had a PCN reaction causing immediate rash, facial/tongue/throat swelling, SOB or lightheadedness with hypotension: No Has patient had a PCN reaction causing severe rash involving mucus membranes or skin necrosis: No Has patient had a PCN reaction that required hospitalization No Has patient had a PCN reaction occurring within the last 10 years: No If all of the above answers are NO, then may proceed with Cephalosporin use. Other reaction(s): Unknown    Erythromycin Rash   Other Nausea Only    Mycins   Tetracyclines & Related Rash   "

## 2024-07-14 ENCOUNTER — Ambulatory Visit

## 2024-12-20 ENCOUNTER — Ambulatory Visit: Payer: Medicare (Managed Care) | Admitting: Obstetrics and Gynecology

## 2024-12-28 ENCOUNTER — Ambulatory Visit (INDEPENDENT_AMBULATORY_CARE_PROVIDER_SITE_OTHER): Admitting: Vascular Surgery
